# Patient Record
Sex: Male | Born: 1975 | Race: Black or African American | Hispanic: No | Marital: Single | Smoking: Former smoker
Health system: Southern US, Community
[De-identification: ages and names within clinical notes are randomized; demographics above are authoritative.]

## PROBLEM LIST (undated history)

## (undated) DIAGNOSIS — I493 Ventricular premature depolarization: Secondary | ICD-10-CM

## (undated) DIAGNOSIS — J45909 Unspecified asthma, uncomplicated: Secondary | ICD-10-CM

## (undated) DIAGNOSIS — I10 Essential (primary) hypertension: Secondary | ICD-10-CM

## (undated) DIAGNOSIS — I255 Ischemic cardiomyopathy: Secondary | ICD-10-CM

## (undated) DIAGNOSIS — I428 Other cardiomyopathies: Secondary | ICD-10-CM

## (undated) DIAGNOSIS — Z720 Tobacco use: Secondary | ICD-10-CM

## (undated) DIAGNOSIS — E119 Type 2 diabetes mellitus without complications: Secondary | ICD-10-CM

## (undated) DIAGNOSIS — R0600 Dyspnea, unspecified: Secondary | ICD-10-CM

## (undated) DIAGNOSIS — I509 Heart failure, unspecified: Secondary | ICD-10-CM

## (undated) DIAGNOSIS — E785 Hyperlipidemia, unspecified: Secondary | ICD-10-CM

## (undated) DIAGNOSIS — I5022 Chronic systolic (congestive) heart failure: Secondary | ICD-10-CM

## (undated) DIAGNOSIS — N182 Chronic kidney disease, stage 2 (mild): Secondary | ICD-10-CM

## (undated) DIAGNOSIS — M109 Gout, unspecified: Secondary | ICD-10-CM

## (undated) DIAGNOSIS — I4729 Other ventricular tachycardia: Secondary | ICD-10-CM

## (undated) DIAGNOSIS — I2510 Atherosclerotic heart disease of native coronary artery without angina pectoris: Secondary | ICD-10-CM

## (undated) DIAGNOSIS — I454 Nonspecific intraventricular block: Secondary | ICD-10-CM

## (undated) DIAGNOSIS — Z789 Other specified health status: Secondary | ICD-10-CM

## (undated) DIAGNOSIS — I77810 Thoracic aortic ectasia: Secondary | ICD-10-CM

## (undated) HISTORY — DX: Heart failure, unspecified: I50.9

---

## 2006-09-22 ENCOUNTER — Emergency Department: Payer: Self-pay | Admitting: Emergency Medicine

## 2015-03-31 ENCOUNTER — Emergency Department: Payer: Medicaid Other

## 2015-03-31 ENCOUNTER — Other Ambulatory Visit: Payer: Self-pay

## 2015-03-31 ENCOUNTER — Encounter: Payer: Self-pay | Admitting: Emergency Medicine

## 2015-03-31 ENCOUNTER — Inpatient Hospital Stay
Admission: EM | Admit: 2015-03-31 | Discharge: 2015-04-03 | DRG: 292 | Disposition: A | Discharge status: Home / Self Care | Payer: Medicaid Other | Attending: Internal Medicine | Admitting: Internal Medicine

## 2015-03-31 DIAGNOSIS — I426 Alcoholic cardiomyopathy: Secondary | ICD-10-CM | POA: Diagnosis present

## 2015-03-31 DIAGNOSIS — Z79899 Other long term (current) drug therapy: Secondary | ICD-10-CM | POA: Diagnosis not present

## 2015-03-31 DIAGNOSIS — Z7982 Long term (current) use of aspirin: Secondary | ICD-10-CM | POA: Diagnosis not present

## 2015-03-31 DIAGNOSIS — Z9114 Patient's other noncompliance with medication regimen: Secondary | ICD-10-CM

## 2015-03-31 DIAGNOSIS — R778 Other specified abnormalities of plasma proteins: Secondary | ICD-10-CM

## 2015-03-31 DIAGNOSIS — Z6841 Body Mass Index (BMI) 40.0 and over, adult: Secondary | ICD-10-CM | POA: Diagnosis not present

## 2015-03-31 DIAGNOSIS — R079 Chest pain, unspecified: Secondary | ICD-10-CM

## 2015-03-31 DIAGNOSIS — I110 Hypertensive heart disease with heart failure: Principal | ICD-10-CM | POA: Diagnosis present

## 2015-03-31 DIAGNOSIS — R0602 Shortness of breath: Secondary | ICD-10-CM

## 2015-03-31 DIAGNOSIS — F17210 Nicotine dependence, cigarettes, uncomplicated: Secondary | ICD-10-CM | POA: Diagnosis present

## 2015-03-31 DIAGNOSIS — J45909 Unspecified asthma, uncomplicated: Secondary | ICD-10-CM | POA: Diagnosis present

## 2015-03-31 DIAGNOSIS — R7989 Other specified abnormal findings of blood chemistry: Secondary | ICD-10-CM

## 2015-03-31 DIAGNOSIS — F1010 Alcohol abuse, uncomplicated: Secondary | ICD-10-CM | POA: Diagnosis present

## 2015-03-31 DIAGNOSIS — I5021 Acute systolic (congestive) heart failure: Secondary | ICD-10-CM | POA: Diagnosis present

## 2015-03-31 DIAGNOSIS — I248 Other forms of acute ischemic heart disease: Secondary | ICD-10-CM | POA: Diagnosis present

## 2015-03-31 DIAGNOSIS — I509 Heart failure, unspecified: Secondary | ICD-10-CM

## 2015-03-31 HISTORY — DX: Essential (primary) hypertension: I10

## 2015-03-31 LAB — BASIC METABOLIC PANEL
Anion gap: 6 (ref 5–15)
BUN: 19 mg/dL (ref 6–20)
CHLORIDE: 105 mmol/L (ref 101–111)
CO2: 26 mmol/L (ref 22–32)
Calcium: 8.7 mg/dL — ABNORMAL LOW (ref 8.9–10.3)
Creatinine, Ser: 1.23 mg/dL (ref 0.61–1.24)
GFR calc Af Amer: >60 mL/min (ref >60)
GFR calc non Af Amer: >60 mL/min (ref >60)
Glucose, Bld: 96 mg/dL (ref 65–99)
POTASSIUM: 3.7 mmol/L (ref 3.5–5.1)
SODIUM: 137 mmol/L (ref 135–145)

## 2015-03-31 LAB — CBC
HEMATOCRIT: 40.2 % (ref 40.0–52.0)
Hemoglobin: 12.8 g/dL — ABNORMAL LOW (ref 13.0–18.0)
MCH: 26.7 pg (ref 26.0–34.0)
MCHC: 31.9 g/dL — AB (ref 32.0–36.0)
MCV: 83.8 fL (ref 80.0–100.0)
PLATELETS: 237 10*3/uL (ref 150–440)
RBC: 4.8 MIL/uL (ref 4.40–5.90)
RDW: 16.6 % — AB (ref 11.5–14.5)
WBC: 11.7 10*3/uL — AB (ref 3.8–10.6)

## 2015-03-31 LAB — BRAIN NATRIURETIC PEPTIDE: B Natriuretic Peptide: 1340 pg/mL — ABNORMAL HIGH (ref 0.0–100.0)

## 2015-03-31 LAB — FIBRIN DERIVATIVES D-DIMER (ARMC ONLY): Fibrin derivatives D-dimer (ARMC): 475 (ref 0–499)

## 2015-03-31 LAB — TROPONIN I
TROPONIN I: 0.09 ng/mL — AB (ref <0.031)
Troponin I: 0.1 ng/mL — ABNORMAL HIGH (ref <0.031)

## 2015-03-31 MED ORDER — FOLIC ACID 1 MG PO TABS
1.0000 mg | ORAL_TABLET | Freq: Every day | ORAL | Status: DC
  Administered 2015-03-31 – 2015-04-03 (×4): 1 mg via ORAL
  Filled 2015-03-31 (×4): qty 1

## 2015-03-31 MED ORDER — ADULT MULTIVITAMIN W/MINERALS CH
1.0000 | ORAL_TABLET | Freq: Every day | ORAL | Status: DC
  Administered 2015-03-31 – 2015-04-01 (×2): 1 via ORAL
  Filled 2015-03-31 (×3): qty 1

## 2015-03-31 MED ORDER — VITAMIN B-1 100 MG PO TABS
100.0000 mg | ORAL_TABLET | Freq: Every day | ORAL | Status: DC
Start: 2015-03-31 — End: 2015-04-03
  Administered 2015-03-31 – 2015-04-03 (×4): 100 mg via ORAL
  Filled 2015-03-31 (×4): qty 1

## 2015-03-31 MED ORDER — FUROSEMIDE 10 MG/ML IJ SOLN
20.0000 mg | Freq: Two times a day (BID) | INTRAMUSCULAR | Status: DC
  Administered 2015-03-31 – 2015-04-01 (×2): 20 mg via INTRAVENOUS
  Filled 2015-03-31 (×2): qty 2

## 2015-03-31 MED ORDER — ASPIRIN 81 MG PO CHEW
324.0000 mg | CHEWABLE_TABLET | Freq: Once | ORAL | Status: AC
Start: 2015-03-31 — End: 2015-03-31
  Administered 2015-03-31: 324 mg via ORAL

## 2015-03-31 MED ORDER — ENOXAPARIN SODIUM 40 MG/0.4ML ~~LOC~~ SOLN
40.0000 mg | Freq: Two times a day (BID) | SUBCUTANEOUS | Status: DC
  Administered 2015-03-31 – 2015-04-03 (×6): 40 mg via SUBCUTANEOUS
  Filled 2015-03-31 (×6): qty 0.4

## 2015-03-31 MED ORDER — ACETAMINOPHEN 325 MG PO TABS
650.0000 mg | ORAL_TABLET | ORAL | Status: DC | PRN
  Administered 2015-04-01: 650 mg via ORAL
  Filled 2015-03-31: qty 2

## 2015-03-31 MED ORDER — ENOXAPARIN SODIUM 40 MG/0.4ML ~~LOC~~ SOLN: 40.0000 mg | SUBCUTANEOUS | Status: DC

## 2015-03-31 MED ORDER — SIMVASTATIN 20 MG PO TABS
20.0000 mg | ORAL_TABLET | Freq: Every day | ORAL | Status: DC
  Administered 2015-04-01 – 2015-04-02 (×2): 20 mg via ORAL
  Filled 2015-03-31 (×2): qty 1

## 2015-03-31 MED ORDER — ONDANSETRON HCL 4 MG/2ML IJ SOLN: 4.0000 mg | Freq: Four times a day (QID) | INTRAMUSCULAR | Status: DC | PRN

## 2015-03-31 MED ORDER — LORAZEPAM 2 MG/ML IJ SOLN: 1.0000 mg | Freq: Four times a day (QID) | INTRAMUSCULAR | Status: DC | PRN

## 2015-03-31 MED ORDER — METOPROLOL TARTRATE 50 MG PO TABS
50.0000 mg | ORAL_TABLET | Freq: Two times a day (BID) | ORAL | Status: DC
Start: 2015-03-31 — End: 2015-04-01
  Administered 2015-03-31 – 2015-04-01 (×2): 50 mg via ORAL
  Filled 2015-03-31 (×2): qty 1

## 2015-03-31 MED ORDER — IPRATROPIUM-ALBUTEROL 0.5-2.5 (3) MG/3ML IN SOLN
3.0000 mL | Freq: Once | RESPIRATORY_TRACT | Status: AC
  Administered 2015-03-31: 3 mL via RESPIRATORY_TRACT
  Filled 2015-03-31: qty 3

## 2015-03-31 MED ORDER — ASPIRIN 81 MG PO CHEW
324.0000 mg | CHEWABLE_TABLET | Freq: Once | ORAL | Status: AC
  Administered 2015-03-31: 324 mg via ORAL
  Filled 2015-03-31: qty 4

## 2015-03-31 MED ORDER — ASPIRIN 300 MG RE SUPP: 300.0000 mg | RECTAL | Status: AC

## 2015-03-31 MED ORDER — NICOTINE 21 MG/24HR TD PT24
21.0000 mg | MEDICATED_PATCH | Freq: Every day | TRANSDERMAL | Status: DC
  Administered 2015-04-01 – 2015-04-03 (×3): 21 mg via TRANSDERMAL
  Filled 2015-03-31 (×3): qty 1

## 2015-03-31 MED ORDER — LORAZEPAM 1 MG PO TABS
1.0000 mg | ORAL_TABLET | Freq: Four times a day (QID) | ORAL | Status: DC | PRN
  Administered 2015-04-01: 1 mg via ORAL
  Filled 2015-03-31: qty 1

## 2015-03-31 MED ORDER — ADULT MULTIVITAMIN W/MINERALS CH
1.0000 | ORAL_TABLET | Freq: Every day | ORAL | Status: DC
Start: 2015-03-31 — End: 2015-04-03
  Administered 2015-04-02 – 2015-04-03 (×2): 1 via ORAL
  Filled 2015-03-31: qty 1

## 2015-03-31 MED ORDER — ASPIRIN EC 81 MG PO TBEC
81.0000 mg | DELAYED_RELEASE_TABLET | Freq: Every day | ORAL | Status: DC
  Administered 2015-04-01 – 2015-04-03 (×3): 81 mg via ORAL
  Filled 2015-03-31 (×3): qty 1

## 2015-03-31 MED ORDER — GUAIFENESIN ER 600 MG PO TB12
600.0000 mg | ORAL_TABLET | Freq: Two times a day (BID) | ORAL | Status: DC | PRN
  Administered 2015-04-01: 600 mg via ORAL
  Filled 2015-03-31: qty 1

## 2015-03-31 MED ORDER — THIAMINE HCL 100 MG/ML IJ SOLN: 100.0000 mg | Freq: Every day | INTRAMUSCULAR | Status: DC

## 2015-03-31 MED ORDER — NITROGLYCERIN 0.4 MG SL SUBL: 0.4000 mg | SUBLINGUAL_TABLET | SUBLINGUAL | Status: DC | PRN

## 2015-03-31 MED ORDER — METOPROLOL TARTRATE 1 MG/ML IV SOLN
5.0000 mg | Freq: Once | INTRAVENOUS | Status: AC
  Administered 2015-03-31: 5 mg via INTRAVENOUS
  Filled 2015-03-31: qty 5

## 2015-03-31 MED ORDER — ASPIRIN 81 MG PO CHEW
324.0000 mg | CHEWABLE_TABLET | ORAL | Status: AC
  Administered 2015-03-31: 324 mg via ORAL
  Filled 2015-03-31: qty 4

## 2015-03-31 NOTE — ED Notes (Signed)
Dr Derrill Kay and charge rn notified troponin 0.09

## 2015-03-31 NOTE — ED Notes (Addendum)
Difficulty breathing x 3 weeks.  Also productive cough.  C/o chest pain with coughing and deep breathing.   Has history of asthma as a child.

## 2015-03-31 NOTE — ED Notes (Signed)
Pt states he came in today bc he has been feeling sob for 2 wks.  Also reports cough with clear mucus.  States he has pain in center of chest when he coughs.  Pt sitting up on stretcher watching tv at this time.  Skin w/d, heart sounds normal, lungs clear.

## 2015-03-31 NOTE — H&P (Signed)
[facility] - [place] at [facility]   PATIENT NAME: Philip Monroe    MR#:  [id number]  DATE OF BIRTH:  Mar 12, 1976  DATE OF ADMISSION:  03/31/2015  PRIMARY CARE PHYSICIAN: [facility]   REQUESTING/REFERRING PHYSICIAN: Dr. Carollee Massed  CHIEF COMPLAINT:  Chest pain, shortness of breath and lower extent is swelling  HISTORY OF PRESENT ILLNESS:  Philip Monroe  is a 40 y.o. male with a known history of essential hypertension not on any home medications, morbid obesity, drinks beer heavily on daily basis admits 12 beers per day and continues to smoke is presenting to the ED with a chief complaint of one-week history of midsternal chest pain associated with shortness of breath and lower extent is swelling for the past to 3 days. Patient couldn't catch his breath and came into the ED. Chest x-ray with the vascular congestion and her initial troponin is abnormal. Patient's mom is at bedside  PAST MEDICAL HISTORY:   Past Medical History  Diagnosis Date  . Hypertension     PAST SURGICAL HISTOIRY:  History reviewed. No pertinent past surgical history.  SOCIAL HISTORY:   Social History  Substance Use Topics  . Smoking status: Current Every Day Smoker -- 1.00 packs/day    Types: Cigarettes  . Smokeless tobacco: Never Used  . Alcohol Use: Yes     Comment: 3 - 32 oz beers per day    FAMILY HISTORY:  No family history on file.  DRUG ALLERGIES:   Allergies  Allergen Reactions  . Lactose Intolerance (Gi) Diarrhea    REVIEW OF SYSTEMS:  CONSTITUTIONAL: No fever, fatigue or weakness.  EYES: No blurred or double vision.  EARS, NOSE, AND THROAT: No tinnitus or ear pain.  RESPIRATORY: Reports cough, shortness of breath, denies wheezing or hemoptysis.  CARDIOVASCULAR: 1 week history of midsternal chest pain, orthopnea, lower extremity edema.  GASTROINTESTINAL: No nausea, vomiting, diarrhea or abdominal pain.  GENITOURINARY: No dysuria, hematuria.   ENDOCRINE: No polyuria, nocturia,  HEMATOLOGY: No anemia, easy bruising or bleeding SKIN: No rash or lesion. MUSCULOSKELETAL: No joint pain or arthritis.   NEUROLOGIC: No tingling, numbness, weakness.  PSYCHIATRY: No anxiety or depression.   MEDICATIONS AT HOME:   Prior to Admission medications   Medication Sig Start Date End Date Taking? Authorizing Provider  guaiFENesin (MUCINEX) 600 MG 12 hr tablet Take 600 mg by mouth 2 (two) times daily as needed for cough or to loosen phlegm.   Yes Historical Provider, MD  guaiFENesin (ROBITUSSIN) 100 MG/5ML SOLN Take 5 mLs by mouth every 4 (four) hours as needed for cough.   Yes Historical Provider, MD  Multiple Vitamin (MULTIVITAMIN WITH MINERALS) TABS tablet Take 1 tablet by mouth daily.   Yes Historical Provider, MD      VITAL SIGNS:  Blood pressure 159/98, pulse 81, temperature 98.2 F (36.8 C), temperature source Oral, resp. rate 30, height  (1.905 m), weight 158.759 kg (350 lb), SpO2 95 %.  PHYSICAL EXAMINATION:  GENERAL:  40 y.o.-year-old patient lying in the bed with no acute distress.  EYES: Pupils equal, round, reactive to light and accommodation. No scleral icterus. Extraocular muscles intact.  HEENT: Head atraumatic, normocephalic. Oropharynx and nasopharynx clear.  NECK:  Supple, no jugular venous distention. No thyroid enlargement, no tenderness.  LUNGS: Diminished breath sounds bilaterally at lower lung fields, no wheezing, positive rales,rhonchi . No use of accessory muscles of respiration.  CARDIOVASCULAR: S1, S2 normal. No murmurs, rubs, or gallops. Nonreproducible midsternal chest pain  ABDOMEN: Soft, nontender, nondistended. Bowel sounds present. No organomegaly or mass.  EXTREMITIES: 1+ pedal edema, no cyanosis, or clubbing.  NEUROLOGIC: Cranial nerves II through XII are intact. Muscle strength 5/5 in all extremities. Sensation intact. Gait not checked.  PSYCHIATRIC: The patient is alert and oriented x 3.  SKIN: No  obvious rash, lesion, or ulcer.   LABORATORY PANEL:   CBC  Recent Labs Lab 03/31/15 1626  WBC 11.7*  HGB 12.8*  HCT 40.2  PLT 237   ------------------------------------------------------------------------------------------------------------------  Chemistries   Recent Labs Lab 03/31/15 1626  NA 137  K 3.7  CL 105  CO2 26  GLUCOSE 96  BUN 19  CREATININE 1.23  CALCIUM 8.7*   ------------------------------------------------------------------------------------------------------------------  Cardiac Enzymes  Recent Labs Lab 03/31/15 1626  TROPONINI 0.09*   ------------------------------------------------------------------------------------------------------------------  RADIOLOGY:  Dg Chest 2 View  03/31/2015  CLINICAL DATA:  Cough.  Short of breath.  History of hypertension. EXAM: CHEST  2 VIEW COMPARISON:  None. FINDINGS: Cardiac silhouette is mildly enlarged. No mediastinal or hilar masses or convincing adenopathy. There is bilateral interstitial thickening. There is no focal lung consolidation to suggest pneumonia. No pleural effusion or pneumothorax. Bony thorax is unremarkable. IMPRESSION: 1. Cardiomegaly with interstitial thickening consistent with mild congestive heart failure. Electronically Signed   By: Amie Portland M.D.   On: 03/31/2015 18:03    EKG:   Orders placed or performed during the hospital encounter of 03/31/15  . ED EKG within 10 minutes  . ED EKG within 10 minutes  . EKG 12-lead    IMPRESSION AND PLAN:   1. Chest pain with abnormal troponin could be from  uncontrolled hypertension/new onset congestive heart failure Admit patient to telemetry Cycle cardiac biomarkers; initial troponin at 0.09 Will get echocardiogram Nitroglycerin sublingually as needed basis Consult cardiology Tight control of the blood pressure is needed  2. 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 Alcohol abuse ciwa protocol Patient will be benefited with outpatient alcohol rehabilitation  5. Tobacco abuse disorder Counseled patient to quit smoking for 3-5 minutes. Patient is agreeable. We will start him on nicotine patch after ruling out  acute coronary syndrome    All the records are reviewed and case discussed with ED provider. Management plans discussed with the patient, family and they are in agreement.  CODE STATUS: fc, mom is the health healthcare power of attorney  TOTAL TIME TAKING CARE OF THIS PATIENT: 45 minutes.    Ramonita Lab M.D on 03/31/2015 at 9:16 PM  Between 7am to 6pm - Pager - [phone]  After 6pm go to [email or web address] - password [email or web address] [facility]  [facility]  Office  [phone]  CC: Primary care physician; [facility]

## 2015-03-31 NOTE — ED Provider Notes (Signed)
[facility] Emergency Department Provider Note  ____________________________________________  Time seen: 1530  I have reviewed the triage vital signs and the nursing notes.  History by:  Patient  HISTORY  Chief Complaint Cough     HPI Philip Monroe is a 40 y.o. male with shortness of breath and chest discomfort over the past 2-3 weeks.  The patient has been using Mucinex and Robitussin for cough and for the congestion. He reports he has tightness in his chest and some heaviness. He reports he had asthma when he was a kid. He has a history of hypertension, but does not take any medications currently for it. He does smoke cigarettes.  He denies any history of blood clots. He denies any peripheral edema. He denies any fever.    Past Medical History  Diagnosis Date  . Hypertension     There are no active problems to display for this patient.   History reviewed. No pertinent past surgical history.  Current Outpatient Rx  Name  Route  Sig  Dispense  Refill  . guaiFENesin (MUCINEX) 600 MG 12 hr tablet   Oral   Take 600 mg by mouth 2 (two) times daily as needed for cough or to loosen phlegm.         Marland Kitchen guaiFENesin (ROBITUSSIN) 100 MG/5ML SOLN   Oral   Take 5 mLs by mouth every 4 (four) hours as needed for cough.         . Multiple Vitamin (MULTIVITAMIN WITH MINERALS) TABS tablet   Oral   Take 1 tablet by mouth daily.           Allergies Lactose intolerance (gi)  No family history on file.  Social History Social History  Substance Use Topics  . Smoking status: Current Every Day Smoker -- 1.00 packs/day    Types: Cigarettes  . Smokeless tobacco: Never Used  . Alcohol Use: Yes     Comment: 3 - 32 oz beers per day    Review of Systems  Constitutional: Negative for fever/chills. ENT: Negative for congestion. Cardiovascular: Positive for chest pressure.Marland Kitchen Respiratory: Positive for cough and shortness of breath. Gastrointestinal:  Negative for abdominal pain, vomiting and diarrhea. Genitourinary: Negative for dysuria. Musculoskeletal: No back pain. Skin: Negative for rash. Neurological: Negative for headache or focal weakness   10-point ROS otherwise negative.  ____________________________________________   PHYSICAL EXAM:  VITAL SIGNS: ED Triage Vitals  Enc Vitals Group     BP 03/31/15 1619 204/151 mmHg     Pulse Rate 03/31/15 1619 103     Resp 03/31/15 1619 20     Temp 03/31/15 1619 98.2 F (36.8 C)     Temp Source 03/31/15 1619 Oral     SpO2 03/31/15 1619 97 %     Weight 03/31/15 1619 350 lb (158.759 kg)     Height 03/31/15 1619  (1.905 m)     Head Cir --      Peak Flow --      Pain Score 03/31/15 1620 0     Pain Loc --      Pain Edu? --      Excl. in GC? --     Constitutional: Alert and oriented. Has 5-6 word dyspnea and looks a little bit uncomfortable, but no acute distress. ENT   Head: Normocephalic and atraumatic.   Nose: No congestion/rhinnorhea.       Mouth: No erythema, no swelling   Cardiovascular: Normal rate, regular rhythm, no murmur noted  Respiratory:  Mild increase work of breathing. Mild coarse breath sounds bilaterally.  Gastrointestinal: Soft, no distention. Nontender Back: No muscle spasm, no tenderness, no CVA tenderness. Musculoskeletal: No deformity noted. Nontender with normal range of motion in all extremities.  No noted edema. Neurologic:  Communicative. Normal appearing spontaneous movement in all 4 extremities. No gross focal neurologic deficits are appreciated.  Skin:  Skin is warm, dry. No rash noted. Psychiatric: Mood and affect are normal. Speech and behavior are normal.  ____________________________________________    LABS (pertinent positives/negatives)  Labs Reviewed  BASIC METABOLIC PANEL - Abnormal; Notable for the following:    Calcium 8.7 (*)    All other components within normal limits  CBC - Abnormal; Notable for the following:    WBC  11.7 (*)    Hemoglobin 12.8 (*)    MCHC 31.9 (*)    RDW 16.6 (*)    All other components within normal limits  TROPONIN I - Abnormal; Notable for the following:    Troponin I 0.09 (*)    All other components within normal limits  FIBRIN DERIVATIVES D-DIMER ([facility] ONLY)     ____________________________________________   EKG  ED ECG REPORT I, Minh Jasper W, the attending physician, personally viewed and interpreted this ECG.   Date: 03/31/2015  EKG Time: 1630  Rate: 101  Rhythm: Sinus tachycardia  Axis: Normal  Intervals: Normal  ST&T Change: No ischemic changes noted.   ____________________________________________    RADIOLOGY  Chest x-ray:  IMPRESSION: 1. Cardiomegaly with interstitial thickening consistent with mild congestive heart failure. ____________________________________________    PROCEDURES  ____________________________________________   INITIAL IMPRESSION / ASSESSMENT AND PLAN / ED COURSE  Pertinent labs & imaging results that were available during my care of the patient were reviewed by me and considered in my medical decision making (see chart for details).  40 year old male with shortness of breath, some chest tightness, signs and symptoms of a respirator tract infection, but with a troponin of 0.09. His EKG does not show any ischemic changes. We'll treat him with aspirin currently. It is unclear if this troponin level of 0.09 is baseline for the patient or a and acute or subacute change.   ----------------------------------------- 7:36 PM on 03/31/2015 -----------------------------------------  D-dimer was negative. Patient is notably hypertensive at 186/125. We will give him a dose of metoprolol. With the borderline troponin level and some vascular congestion noted on chest x-ray, we will admit him to the hospital for serial enzymes and possible echocardiogram.  ____________________________________________   FINAL CLINICAL IMPRESSION(S)  / ED DIAGNOSES  Final diagnoses:  Chest pain, unspecified chest pain type  Shortness of breath  Troponin level elevated      Darien Ramus, MD 03/31/15 [phone]

## 2015-03-31 NOTE — Progress Notes (Signed)
Anticoagulation monitoring(Lovenox):   40 yo  ordered Lovenox 40 mg Q24h  Filed Weights   03/31/15 1619  Weight: 350 lb (158.759 kg)   BMI 43.8   Lab Results  Component Value Date   CREATININE 1.23 03/31/2015   Estimated Creatinine Clearance: 131.5 mL/min (by C-G formula based on Cr of 1.23). Hemoglobin & Hematocrit     Component Value Date/Time   HGB 12.8* 03/31/2015 1626   HCT 40.2 03/31/2015 1626     Per Protocol for Patient with estCrcl< 30 ml/min and BMI < 40, will transition to Lovenox 40  mg Q24hQ12 h.

## 2015-04-01 ENCOUNTER — Inpatient Hospital Stay (HOSPITAL_COMMUNITY)
Admit: 2015-04-01 | Discharge: 2015-04-01 | Disposition: A | Discharge status: Home / Self Care | Payer: Medicaid Other | Attending: Internal Medicine | Admitting: Internal Medicine

## 2015-04-01 DIAGNOSIS — R0600 Dyspnea, unspecified: Secondary | ICD-10-CM

## 2015-04-01 LAB — CBC
HEMATOCRIT: 38.5 % — AB (ref 40.0–52.0)
Hemoglobin: 12.2 g/dL — ABNORMAL LOW (ref 13.0–18.0)
MCH: 26.8 pg (ref 26.0–34.0)
MCHC: 31.7 g/dL — AB (ref 32.0–36.0)
MCV: 84.5 fL (ref 80.0–100.0)
Platelets: 210 10*3/uL (ref 150–440)
RBC: 4.55 MIL/uL (ref 4.40–5.90)
RDW: 16.6 % — AB (ref 11.5–14.5)
WBC: 10.2 10*3/uL (ref 3.8–10.6)

## 2015-04-01 LAB — BASIC METABOLIC PANEL
ANION GAP: 4 — AB (ref 5–15)
BUN: 22 mg/dL — ABNORMAL HIGH (ref 6–20)
CALCIUM: 8.9 mg/dL (ref 8.9–10.3)
CO2: 31 mmol/L (ref 22–32)
Chloride: 105 mmol/L (ref 101–111)
Creatinine, Ser: 1.39 mg/dL — ABNORMAL HIGH (ref 0.61–1.24)
Glucose, Bld: 117 mg/dL — ABNORMAL HIGH (ref 65–99)
Potassium: 4.1 mmol/L (ref 3.5–5.1)
Sodium: 140 mmol/L (ref 135–145)

## 2015-04-01 LAB — TROPONIN I
Troponin I: 0.08 ng/mL — ABNORMAL HIGH (ref <0.031)
Troponin I: 0.08 ng/mL — ABNORMAL HIGH (ref <0.031)

## 2015-04-01 LAB — LIPID PANEL
CHOLESTEROL: 138 mg/dL (ref 0–200)
HDL: 28 mg/dL — AB (ref >40)
LDL Cholesterol: 87 mg/dL (ref 0–99)
TRIGLYCERIDES: 114 mg/dL (ref <150)
Total CHOL/HDL Ratio: 4.9 RATIO
VLDL: 23 mg/dL (ref 0–40)

## 2015-04-01 LAB — TSH: TSH: 3.25 u[IU]/mL (ref 0.350–4.500)

## 2015-04-01 MED ORDER — LISINOPRIL 20 MG PO TABS
20.0000 mg | ORAL_TABLET | Freq: Every day | ORAL | Status: DC
  Administered 2015-04-01: 20 mg via ORAL
  Filled 2015-04-01 (×2): qty 1

## 2015-04-01 MED ORDER — METOPROLOL TARTRATE 100 MG PO TABS
100.0000 mg | ORAL_TABLET | Freq: Two times a day (BID) | ORAL | Status: DC
  Administered 2015-04-01 – 2015-04-03 (×4): 100 mg via ORAL
  Filled 2015-04-01 (×4): qty 1

## 2015-04-01 MED ORDER — LABETALOL HCL 5 MG/ML IV SOLN
10.0000 mg | INTRAVENOUS | Status: DC | PRN
  Administered 2015-04-01: 10 mg via INTRAVENOUS
  Filled 2015-04-01: qty 4

## 2015-04-01 MED ORDER — NITROGLYCERIN 2 % TD OINT
1.0000 [in_us] | TOPICAL_OINTMENT | Freq: Once | TRANSDERMAL | Status: AC
  Administered 2015-04-01: 1 [in_us] via TOPICAL
  Filled 2015-04-01: qty 1

## 2015-04-01 MED ORDER — LABETALOL HCL 5 MG/ML IV SOLN
10.0000 mg | INTRAVENOUS | Status: DC | PRN
  Administered 2015-04-02: 10 mg via INTRAVENOUS
  Filled 2015-04-01: qty 4

## 2015-04-01 MED ORDER — HYDROCHLOROTHIAZIDE 25 MG PO TABS
25.0000 mg | ORAL_TABLET | Freq: Every day | ORAL | Status: DC
  Administered 2015-04-01: 25 mg via ORAL
  Filled 2015-04-01 (×2): qty 1

## 2015-04-01 MED ORDER — HYDRALAZINE HCL 20 MG/ML IJ SOLN
INTRAMUSCULAR | Status: AC
  Filled 2015-04-01: qty 1

## 2015-04-01 MED ORDER — FUROSEMIDE 20 MG PO TABS
20.0000 mg | ORAL_TABLET | Freq: Two times a day (BID) | ORAL | Status: DC
  Administered 2015-04-01 – 2015-04-03 (×4): 20 mg via ORAL
  Filled 2015-04-01 (×4): qty 1

## 2015-04-01 MED ORDER — NITROGLYCERIN 2 % TD OINT
2.0000 [in_us] | TOPICAL_OINTMENT | Freq: Once | TRANSDERMAL | Status: AC
  Administered 2015-04-01: 2 [in_us] via TOPICAL
  Filled 2015-04-01: qty 2

## 2015-04-01 MED ORDER — HYDRALAZINE HCL 20 MG/ML IJ SOLN
10.0000 mg | Freq: Once | INTRAMUSCULAR | Status: AC
  Administered 2015-04-01: 10 mg via INTRAVENOUS

## 2015-04-01 MED ORDER — IPRATROPIUM-ALBUTEROL 0.5-2.5 (3) MG/3ML IN SOLN
3.0000 mL | Freq: Four times a day (QID) | RESPIRATORY_TRACT | Status: DC
  Administered 2015-04-01 – 2015-04-02 (×7): 3 mL via RESPIRATORY_TRACT
  Filled 2015-04-01 (×8): qty 3

## 2015-04-01 MED ORDER — LABETALOL HCL 5 MG/ML IV SOLN: 10.0000 mg | INTRAVENOUS | Status: DC | PRN

## 2015-04-01 MED ORDER — HYDROCHLOROTHIAZIDE 25 MG PO TABS: 25.0000 mg | ORAL_TABLET | Freq: Every day | ORAL | Status: DC

## 2015-04-01 NOTE — Progress Notes (Signed)
MD notified patient's second troponin results 0.10, as requested by previous ordering physician. No new orders obtained.

## 2015-04-01 NOTE — Progress Notes (Signed)
*  PRELIMINARY RESULTS* Echocardiogram 2D Echocardiogram has been performed.  Philip Monroe Hege 04/01/2015, 10:02 AM

## 2015-04-01 NOTE — H&P (Signed)
[facility]  CARDIOLOGY CONSULT NOTE  Patient ID: Philip Monroe MRN: [id number] DOB/AGE: Nov 10, 1975 40 y.o.  Admit date: 03/31/2015 Referring Physician Allena Katz Primary Physician [facility] Primary Cardiologist Reason for Consultation chest pain  HPI: 40 year old gentleman referred for evaluation of chest pain. The patient has a history of essential hypertension currently not taking any medications. He admits to drinking 12 beers a day and a pack of cigarettes. He presents to [facility] emergency room with a one-week history of intermittent chest pain. He describes substernal chest discomfort, notable when he lays back in bed, or coughs, lead by sitting up. Patient has also experienced shortness of breath and lower extremity edema for the past 3 days. He presents to [facility] emergency room with chief complaint of shortness of breath, chest x-ray revealed mild vascular congestion consistent with diastolic congestive heart failure. Admission labs were notable for borderline elevated troponin of 0.09 without peak or trough. Patient has been treated with intravenous furosemide with diuresis and overall clinical improvement.  Review of systems complete and found to be negative unless listed above     Past Medical History  Diagnosis Date  . Hypertension     History reviewed. No pertinent past surgical history.  Prescriptions prior to admission  Medication Sig Dispense Refill Last Dose  . guaiFENesin (MUCINEX) 600 MG 12 hr tablet Take 600 mg by mouth 2 (two) times daily as needed for cough or to loosen phlegm.   Past Week at Unknown time  . guaiFENesin (ROBITUSSIN) 100 MG/5ML SOLN Take 5 mLs by mouth every 4 (four) hours as needed for cough.   03/30/2015 at Unsure   . Multiple Vitamin (MULTIVITAMIN WITH MINERALS) TABS tablet Take 1 tablet by mouth daily.   03/31/2015 at Unknown time   Social History   Social History  . Marital Status: Single    Spouse Name: N/A  . Number of Children: N/A  .  Years of Education: N/A   Occupational History  . Not on file.   Social History Main Topics  . Smoking status: Current Every Day Smoker -- 1.00 packs/day    Types: Cigarettes  . Smokeless tobacco: Never Used  . Alcohol Use: Yes     Comment: 3 - 32 oz beers per day  . Drug Use: No  . Sexual Activity: Yes    Birth Control/ Protection: Coitus interruptus, Condom   Other Topics Concern  . Not on file   Social History Narrative    History reviewed. No pertinent family history.    Review of systems complete and found to be negative unless listed above      PHYSICAL EXAM  General: Well developed, well nourished, in no acute distress HEENT:  Normocephalic and atramatic Neck:  No JVD.  Lungs: Clear bilaterally to auscultation and percussion. Heart: HRRR . Normal S1 and S2 without gallops or murmurs.  Abdomen: Bowel sounds are positive, abdomen soft and non-tender  Msk:  Back normal, normal gait. Normal strength and tone for age. Extremities: No clubbing, cyanosis or edema.   Neuro: Alert and oriented X 3. Psych:  Good affect, responds appropriately  Labs:   Lab Results  Component Value Date   WBC 10.2 04/01/2015   HGB 12.2* 04/01/2015   HCT 38.5* 04/01/2015   MCV 84.5 04/01/2015   PLT 210 04/01/2015    Recent Labs Lab 04/01/15 0519  NA 140  K 4.1  CL 105  CO2 31  BUN 22*  CREATININE 1.39*  CALCIUM 8.9  GLUCOSE  117*   Lab Results  Component Value Date   TROPONINI 0.08* 04/01/2015    Lab Results  Component Value Date   CHOL 138 04/01/2015   Lab Results  Component Value Date   HDL 28* 04/01/2015   Lab Results  Component Value Date   LDLCALC 87 04/01/2015   Lab Results  Component Value Date   TRIG 114 04/01/2015   Lab Results  Component Value Date   CHOLHDL 4.9 04/01/2015   No results found for: LDLDIRECT    Radiology: Dg Chest 2 View  03/31/2015  CLINICAL DATA:  Cough.  Short of breath.  History of hypertension. EXAM: CHEST  2 VIEW  COMPARISON:  None. FINDINGS: Cardiac silhouette is mildly enlarged. No mediastinal or hilar masses or convincing adenopathy. There is bilateral interstitial thickening. There is no focal lung consolidation to suggest pneumonia. No pleural effusion or pneumothorax. Bony thorax is unremarkable. IMPRESSION: 1. Cardiomegaly with interstitial thickening consistent with mild congestive heart failure. Electronically Signed   By: Amie Portland M.D.   On: 03/31/2015 18:03    EKG: Sinus rhythm  ASSESSMENT AND PLAN:   1. Chest pain, with atypical features, nondiagnostic ECG. Troponin is borderline elevated, likely due to demand supply ischemia, in the setting of uncontrolled hypertension and probable diastolic congestive heart failure. 2. Essential hypertension 3. Acute diastolic congestive heart failure 4. Alcohol abuse 5. Tobacco abuse  Recommendations  1. Agree with overall current therapy 2. For full dose anticoagulation at this time 3. Review 2-D echocardiogram 4. Lexiscan sestamibi study in the a.m.    SignedMarcina Millard MD,PhD, [facility] 04/01/2015, 1:31 PM

## 2015-04-01 NOTE — Progress Notes (Signed)
MD notified patient remains hypertensive 169/112 after receiving previous order for IV Labetalol and PO Ativan. Orders obtained for ntg paste 1inch to chest and  IV hydralazine. Will administer medications and reassess.

## 2015-04-01 NOTE — Progress Notes (Signed)
MD notified of patient's continued HTN, despite IV lasix and PO metoprolol. New orders received for PO lisinopril and HCTZ both PO. Also, patient c/o SOB and chest tightness with inhalation, MD informed- new orders for SVN's. Will continue to monitor

## 2015-04-01 NOTE — Plan of Care (Signed)
Problem: Tissue Perfusion: Goal: Risk factors for ineffective tissue perfusion will decrease Outcome: Progressing SQ lovenox  Problem: Activity: Goal: Risk for activity intolerance will decrease Outcome: Progressing Patient is ambulatory, independent in the room.  Problem: Fluid Volume: Goal: Ability to maintain a balanced intake and output will improve Outcome: Progressing Patient diuresing well with IV lasix.

## 2015-04-01 NOTE — Progress Notes (Signed)
Dr. Allena Katz paged, pt DBP remains borderline high, orders received for nitro paste.

## 2015-04-01 NOTE — Progress Notes (Signed)
Stress test consent signed and placed in chart.

## 2015-04-01 NOTE — Plan of Care (Signed)
Problem: Education: Goal: Ability to demonstrate managment of disease process will improve Outcome: Progressing Patient and mother watched both CHF videos, no questions at this time. CHF booklet is unavailable at this time, however, education from [facility] has been printed and provided to patient.

## 2015-04-01 NOTE — Progress Notes (Signed)
[facility] Physicians - [place] at [facility]                                                                                                                                                                                            Patient Demographics   Philip Monroe, is a 40 y.o. male, DOB - [birth year], ZOX:[id number]  Admit date - 03/31/2015   Admitting Physician Ramonita Lab, MD  Outpatient Primary MD for the patient is [facility]   LOS - 1  Subjective: Patient reports that his shortness of breath and chest pain is improved. Swelling in the lower extremities improved     Review of Systems:   CONSTITUTIONAL: No documented fever. No fatigue, weakness. No weight gain, no weight loss.  EYES: No blurry or double vision.  ENT: No tinnitus. No postnasal drip. No redness of the oropharynx.  RESPIRATORY: No cough, no wheeze, no hemoptysis. Positive dyspnea.  CARDIOVASCULAR: No chest pain. No orthopnea. No palpitations. No syncope.  GASTROINTESTINAL: No nausea, no vomiting or diarrhea. No abdominal pain. No melena or hematochezia.  GENITOURINARY: No dysuria or hematuria.  ENDOCRINE: No polyuria or nocturia. No heat or cold intolerance.  HEMATOLOGY: No anemia. No bruising. No bleeding.  INTEGUMENTARY: No rashes. No lesions.  MUSCULOSKELETAL: No arthritis. No swelling. No gout.  NEUROLOGIC: No numbness, tingling, or ataxia. No seizure-type activity.  PSYCHIATRIC: No anxiety. No insomnia. No ADD.    Vitals:   Filed Vitals:   04/01/15 8119 04/01/15 0655 04/01/15 1117 04/01/15 1118  BP: 165/125 147/93 144/111 145/95  Pulse:   75 73  Temp:   98 F (36.7 C)   TempSrc:   Oral   Resp:   18   Height:      Weight:      SpO2:   95%     Wt Readings from Last 3 Encounters:  04/01/15 153.044 kg (337 lb 6.4 oz)     Intake/Output Summary (Last 24 hours) at 04/01/15 1438 Last data filed at 04/01/15 1150  Gross per 24 hour  Intake      0 ml  Output   3450 ml   Net  -3450 ml    Physical Exam:   GENERAL: Pleasant-appearing in no apparent distress.  HEAD, EYES, EARS, NOSE AND THROAT: Atraumatic, normocephalic. Extraocular muscles are intact. Pupils equal and reactive to light. Sclerae anicteric. No conjunctival injection. No oro-pharyngeal erythema.  NECK: Supple. There is no jugular venous distention. No bruits, no lymphadenopathy, no thyromegaly.  HEART: Regular rate and rhythm,. No murmurs, no rubs, no clicks.  LUNGS: Clear to auscultation bilaterally. No rales or rhonchi.  No wheezes.  ABDOMEN: Soft, flat, nontender, nondistended. Has good bowel sounds. No hepatosplenomegaly appreciated.  EXTREMITIES: No evidence of any cyanosis, clubbing, or peripheral edema.  +2 pedal and radial pulses bilaterally.  NEUROLOGIC: The patient is alert, awake, and oriented x3 with no focal motor or sensory deficits appreciated bilaterally.  SKIN: Moist and warm with no rashes appreciated.  Psych: Not anxious, depressed LN: No inguinal LN enlargement    Antibiotics   Anti-infectives    None      Medications   Scheduled Meds: . aspirin EC  81 mg Oral Daily  . enoxaparin (LOVENOX) injection  40 mg Subcutaneous Q12H  . folic acid  1 mg Oral Daily  . furosemide  20 mg Oral BID  . hydrALAZINE      . hydrochlorothiazide  25 mg Oral Daily  . ipratropium-albuterol  3 mL Nebulization Q6H  . lisinopril  20 mg Oral Daily  . metoprolol tartrate  50 mg Oral BID  . multivitamin with minerals  1 tablet Oral Daily  . multivitamin with minerals  1 tablet Oral Daily  . nicotine  21 mg Transdermal Daily  . simvastatin  20 mg Oral q1800  . thiamine  100 mg Oral Daily   Or  . thiamine  100 mg Intravenous Daily   Continuous Infusions:  PRN Meds:.acetaminophen, guaiFENesin, labetalol, LORazepam **OR** LORazepam, nitroGLYCERIN, ondansetron (ZOFRAN) IV   Data Review:   Micro Results No results found for this or any previous visit (from the past 240  hour(s)).  Radiology Reports Dg Chest 2 View  03/31/2015  CLINICAL DATA:  Cough.  Short of breath.  History of hypertension. EXAM: CHEST  2 VIEW COMPARISON:  None. FINDINGS: Cardiac silhouette is mildly enlarged. No mediastinal or hilar masses or convincing adenopathy. There is bilateral interstitial thickening. There is no focal lung consolidation to suggest pneumonia. No pleural effusion or pneumothorax. Bony thorax is unremarkable. IMPRESSION: 1. Cardiomegaly with interstitial thickening consistent with mild congestive heart failure. Electronically Signed   By: Amie Portland M.D.   On: 03/31/2015 18:03     CBC  Recent Labs Lab 03/31/15 1626 04/01/15 0519  WBC 11.7* 10.2  HGB 12.8* 12.2*  HCT 40.2 38.5*  PLT 237 210  MCV 83.8 84.5  MCH 26.7 26.8  MCHC 31.9* 31.7*  RDW 16.6* 16.6*    Chemistries   Recent Labs Lab 03/31/15 1626 04/01/15 0519  NA 137 140  K 3.7 4.1  CL 105 105  CO2 26 31  GLUCOSE 96 117*  BUN 19 22*  CREATININE 1.23 1.39*  CALCIUM 8.7* 8.9   ------------------------------------------------------------------------------------------------------------------ estimated creatinine clearance is 114 mL/min (by C-G formula based on Cr of 1.39). ------------------------------------------------------------------------------------------------------------------ No results for input(s): HGBA1C in the last 72 hours. ------------------------------------------------------------------------------------------------------------------  Recent Labs  04/01/15 0519  CHOL 138  HDL 28*  LDLCALC 87  TRIG 811  CHOLHDL 4.9   ------------------------------------------------------------------------------------------------------------------  Recent Labs  04/01/15 0519  TSH 3.250   ------------------------------------------------------------------------------------------------------------------ No results for input(s): VITAMINB12, FOLATE, FERRITIN, TIBC, IRON, RETICCTPCT  in the last 72 hours.  Coagulation profile No results for input(s): INR, PROTIME in the last 168 hours.  No results for input(s): DDIMER in the last 72 hours.  Cardiac Enzymes  Recent Labs Lab 03/31/15 2202 04/01/15 0519 04/01/15 1016  TROPONINI 0.10* 0.08* 0.08*   ------------------------------------------------------------------------------------------------------------------ Invalid input(s): POCBNP    Assessment & Plan   1. Chest pain with abnormal troponin  Seen by cardiology plan for stress may be in the morning  2. 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 Alcohol abuse ciwa protocol Patient will be benefited with outpatient alcohol rehabilitation  5. Tobacco abuse disorder Counseled by admiting md      Code Status Orders        Start     Ordered   03/31/15 2048  Full code   Continuous     03/31/15 2047    Code Status History    Date Active Date Inactive Code Status Order ID Comments User Context   This patient has a current code status but no historical code status.           Consults   DVT Prophylaxis  Lovenox    Lab Results  Component Value Date   PLT 210 04/01/2015     Time Spent in minutes   Auburn Bilberry M.D on 04/01/2015 at 2:38 PM  Between 7am to 6pm - Pager - [phone]  After 6pm go to [email or web address] - password [email or web address] [facility]  [facility] [place] [facility]   Office  [phone]

## 2015-04-01 NOTE — Plan of Care (Signed)
Problem: Health Behavior/Discharge Planning: Goal: Ability to manage health-related needs will improve Outcome: Progressing Patient new CHF, education has begun. Patient appears receptive.

## 2015-04-02 ENCOUNTER — Inpatient Hospital Stay: Payer: Medicaid Other

## 2015-04-02 LAB — BASIC METABOLIC PANEL
Anion gap: 8 (ref 5–15)
BUN: 22 mg/dL — ABNORMAL HIGH (ref 6–20)
CHLORIDE: 104 mmol/L (ref 101–111)
CO2: 27 mmol/L (ref 22–32)
CREATININE: 1.36 mg/dL — AB (ref 0.61–1.24)
Calcium: 8.5 mg/dL — ABNORMAL LOW (ref 8.9–10.3)
Glucose, Bld: 109 mg/dL — ABNORMAL HIGH (ref 65–99)
POTASSIUM: 3.6 mmol/L (ref 3.5–5.1)
SODIUM: 139 mmol/L (ref 135–145)

## 2015-04-02 LAB — CBC
HCT: 37.4 % — ABNORMAL LOW (ref 40.0–52.0)
Hemoglobin: 11.9 g/dL — ABNORMAL LOW (ref 13.0–18.0)
MCH: 26.7 pg (ref 26.0–34.0)
MCHC: 31.9 g/dL — ABNORMAL LOW (ref 32.0–36.0)
MCV: 83.7 fL (ref 80.0–100.0)
PLATELETS: 217 10*3/uL (ref 150–440)
RBC: 4.47 MIL/uL (ref 4.40–5.90)
RDW: 16.8 % — ABNORMAL HIGH (ref 11.5–14.5)
WBC: 11.8 10*3/uL — AB (ref 3.8–10.6)

## 2015-04-02 MED ORDER — HYDRALAZINE HCL 50 MG PO TABS
50.0000 mg | ORAL_TABLET | Freq: Three times a day (TID) | ORAL | Status: DC
  Administered 2015-04-02 – 2015-04-03 (×4): 50 mg via ORAL
  Filled 2015-04-02 (×4): qty 1

## 2015-04-02 MED ORDER — REGADENOSON 0.4 MG/5ML IV SOLN
0.4000 mg | Freq: Once | INTRAVENOUS | Status: AC
Start: 2015-04-02 — End: 2015-04-02
  Administered 2015-04-02: 0.4 mg via INTRAVENOUS

## 2015-04-02 MED ORDER — LISINOPRIL 20 MG PO TABS
40.0000 mg | ORAL_TABLET | Freq: Every day | ORAL | Status: DC
  Administered 2015-04-02 – 2015-04-03 (×2): 40 mg via ORAL
  Filled 2015-04-02 (×2): qty 2

## 2015-04-02 MED ORDER — IPRATROPIUM-ALBUTEROL 0.5-2.5 (3) MG/3ML IN SOLN
3.0000 mL | Freq: Three times a day (TID) | RESPIRATORY_TRACT | Status: DC
  Administered 2015-04-03: 3 mL via RESPIRATORY_TRACT
  Filled 2015-04-02: qty 3

## 2015-04-02 MED ORDER — TECHNETIUM TC 99M SESTAMIBI - CARDIOLITE
30.0000 | Freq: Once | INTRAVENOUS | Status: AC | PRN
  Administered 2015-04-02: 29.57 via INTRAVENOUS

## 2015-04-02 NOTE — Progress Notes (Signed)
[facility] Physicians - [place] at [facility]                                                                                                                                                                                            Patient Demographics   Philip Monroe, is a 40 y.o. male, DOB - 12-18-1975, ZOX:[id number]  Admit date - 03/31/2015   Admitting Physician Ramonita Lab, MD  Outpatient Primary MD for the patient is [facility]   LOS - 2  Subjective:  Swelling continues to improve shortness of breath improved     Review of Systems:   CONSTITUTIONAL: No documented fever. No fatigue, weakness. No weight gain, no weight loss.  EYES: No blurry or double vision.  ENT: No tinnitus. No postnasal drip. No redness of the oropharynx.  RESPIRATORY: No cough, no wheeze, no hemoptysis. Positive dyspnea.  CARDIOVASCULAR: No chest pain. No orthopnea. No palpitations. No syncope.  GASTROINTESTINAL: No nausea, no vomiting or diarrhea. No abdominal pain. No melena or hematochezia.  GENITOURINARY: No dysuria or hematuria.  ENDOCRINE: No polyuria or nocturia. No heat or cold intolerance.  HEMATOLOGY: No anemia. No bruising. No bleeding.  INTEGUMENTARY: No rashes. No lesions.  MUSCULOSKELETAL: No arthritis. No swelling. No gout.  NEUROLOGIC: No numbness, tingling, or ataxia. No seizure-type activity.  PSYCHIATRIC: No anxiety. No insomnia. No ADD.    Vitals:   Filed Vitals:   04/02/15 0355 04/02/15 1001 04/02/15 1109 04/02/15 1333  BP: 156/94 181/136 154/84   Pulse:  82 72   Temp:   98.3 F (36.8 C)   TempSrc:   Oral   Resp:   18   Height:      Weight:      SpO2: 98%  96% 96%    Wt Readings from Last 3 Encounters:  04/02/15 151.728 kg (334 lb 8 oz)     Intake/Output Summary (Last 24 hours) at 04/02/15 1431 Last data filed at 04/02/15 0122  Gross per 24 hour  Intake      0 ml  Output      0 ml  Net      0 ml    Physical Exam:   GENERAL:  Pleasant-appearing in no apparent distress.  HEAD, EYES, EARS, NOSE AND THROAT: Atraumatic, normocephalic. Extraocular muscles are intact. Pupils equal and reactive to light. Sclerae anicteric. No conjunctival injection. No oro-pharyngeal erythema.  NECK: Supple. There is no jugular venous distention. No bruits, no lymphadenopathy, no thyromegaly.  HEART: Regular rate and rhythm,. No murmurs, no rubs, no clicks.  LUNGS: Clear to auscultation bilaterally. No rales or rhonchi. No wheezes.  ABDOMEN: Soft, flat, nontender, nondistended. Has good bowel sounds. No hepatosplenomegaly appreciated.  EXTREMITIES: No evidence of any cyanosis, clubbing, or peripheral edema.  +2 pedal and radial pulses bilaterally.  NEUROLOGIC: The patient is alert, awake, and oriented x3 with no focal motor or sensory deficits appreciated bilaterally.  SKIN: Moist and warm with no rashes appreciated.  Psych: Not anxious, depressed LN: No inguinal LN enlargement    Antibiotics   Anti-infectives    None      Medications   Scheduled Meds: . aspirin EC  81 mg Oral Daily  . enoxaparin (LOVENOX) injection  40 mg Subcutaneous Q12H  . folic acid  1 mg Oral Daily  . furosemide  20 mg Oral BID  . hydrALAZINE  50 mg Oral 3 times per day  . ipratropium-albuterol  3 mL Nebulization Q6H  . lisinopril  40 mg Oral Daily  . metoprolol tartrate  100 mg Oral BID  . multivitamin with minerals  1 tablet Oral Daily  . multivitamin with minerals  1 tablet Oral Daily  . nicotine  21 mg Transdermal Daily  . simvastatin  20 mg Oral q1800  . thiamine  100 mg Oral Daily   Or  . thiamine  100 mg Intravenous Daily   Continuous Infusions:  PRN Meds:.acetaminophen, guaiFENesin, labetalol, LORazepam **OR** LORazepam, nitroGLYCERIN, ondansetron (ZOFRAN) IV   Data Review:   Micro Results No results found for this or any previous visit (from the past 240 hour(s)).  Radiology Reports Dg Chest 2 View  03/31/2015  CLINICAL DATA:   Cough.  Short of breath.  History of hypertension. EXAM: CHEST  2 VIEW COMPARISON:  None. FINDINGS: Cardiac silhouette is mildly enlarged. No mediastinal or hilar masses or convincing adenopathy. There is bilateral interstitial thickening. There is no focal lung consolidation to suggest pneumonia. No pleural effusion or pneumothorax. Bony thorax is unremarkable. IMPRESSION: 1. Cardiomegaly with interstitial thickening consistent with mild congestive heart failure. Electronically Signed   By: Amie Portland M.D.   On: 03/31/2015 18:03     CBC  Recent Labs Lab 03/31/15 1626 04/01/15 0519 04/02/15 0523  WBC 11.7* 10.2 11.8*  HGB 12.8* 12.2* 11.9*  HCT 40.2 38.5* 37.4*  PLT 237 210 217  MCV 83.8 84.5 83.7  MCH 26.7 26.8 26.7  MCHC 31.9* 31.7* 31.9*  RDW 16.6* 16.6* 16.8*    Chemistries   Recent Labs Lab 03/31/15 1626 04/01/15 0519 04/02/15 0523  NA 137 140 139  K 3.7 4.1 3.6  CL 105 105 104  CO2 GLUCOSE 96 117* 109*  BUN 19 22* 22*  CREATININE 1.23 1.39* 1.36*  CALCIUM 8.7* 8.9 8.5*   ------------------------------------------------------------------------------------------------------------------ estimated creatinine clearance is 116 mL/min (by C-G formula based on Cr of 1.36). ------------------------------------------------------------------------------------------------------------------ No results for input(s): HGBA1C in the last 72 hours. ------------------------------------------------------------------------------------------------------------------  Recent Labs  04/01/15 0519  CHOL 138  HDL 28*  LDLCALC 87  TRIG 161  CHOLHDL 4.9   ------------------------------------------------------------------------------------------------------------------  Recent Labs  04/01/15 0519  TSH 3.250   ------------------------------------------------------------------------------------------------------------------ No results for input(s): VITAMINB12, FOLATE,  FERRITIN, TIBC, IRON, RETICCTPCT in the last 72 hours.  Coagulation profile No results for input(s): INR, PROTIME in the last 168 hours.  No results for input(s): DDIMER in the last 72 hours.  Cardiac Enzymes  Recent Labs Lab 03/31/15 2202 04/01/15 0519 04/01/15 1016  TROPONINI 0.10* 0.08* 0.08*   ------------------------------------------------------------------------------------------------------------------ Invalid input(s): POCBNP    Assessment & Plan   1. Chest pain with abnormal  troponin  Stress test part one done in part to to be done tomorrow due to his weight  2. 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 Alcohol abuse- no evidence of withdrawal ciwa protocol Patient will be benefited with outpatient alcohol rehabilitation  5. Tobacco abuse disorder Counseled by admiting md      Code Status Orders        Start     Ordered   03/31/15 2048  Full code   Continuous     03/31/15 2047    Code Status History    Date Active Date Inactive Code Status Order ID Comments User Context   This patient has a current code status but no historical code status.           Consults   DVT Prophylaxis  Lovenox    Lab Results  Component Value Date   PLT 217 04/02/2015     Time Spent in minutes   Auburn Bilberry M.D on 04/02/2015 at 2:31 PM  Between 7am to 6pm - Pager - [phone]  After 6pm go to [email or web address] - password [email or web address] [facility]  [facility]   Office  [phone]

## 2015-04-02 NOTE — Progress Notes (Signed)
"  Living better with heart failure" booklet printed and given to patient and mother, reviewed at bedside. Pt encouraged to take test at the back of the booklet. Reports no questions as of right now.

## 2015-04-02 NOTE — Progress Notes (Signed)
PRN labetalol given for the following BP: Filed Vitals:   04/02/15 0355 04/02/15 1001  BP: 156/94 181/136  Pulse:  82  Temp:    Resp:     MD aware. All POs given at time of blood pressure reading, will recheck in an hour.

## 2015-04-02 NOTE — Progress Notes (Signed)
Per nuc med, pt stress test is 2-day as pt is >300lb. Dr. Allena Katz updated.

## 2015-04-02 NOTE — Progress Notes (Signed)
Initial appointment made at the [facility] on April 20, 2015 at 9:30am. Thank you.

## 2015-04-03 ENCOUNTER — Encounter: Payer: MEDICAID | Attending: Cardiology

## 2015-04-03 DIAGNOSIS — R079 Chest pain, unspecified: Secondary | ICD-10-CM | POA: Insufficient documentation

## 2015-04-03 LAB — NM MYOCAR MULTI W/SPECT W/WALL MOTION / EF
CHL CUP NUCLEAR SRS: 8
CHL CUP STRESS STAGE 1 HR: 84 {beats}/min
CHL CUP STRESS STAGE 1 SPEED: 0 mph
CHL CUP STRESS STAGE 2 GRADE: 0 %
CHL CUP STRESS STAGE 2 SPEED: 0 mph
CHL CUP STRESS STAGE 3 GRADE: 0 %
CHL CUP STRESS STAGE 4 HR: 73 {beats}/min
CHL CUP STRESS STAGE 4 SPEED: 0 mph
CHL CUP STRESS STAGE 5 GRADE: 0 %
CHL CUP STRESS STAGE 6 DBP: 105 mmHg
CHL CUP STRESS STAGE 6 GRADE: 0 %
CHL CUP STRESS STAGE 6 HR: 87 {beats}/min
CHL CUP STRESS STAGE 6 SBP: 176 mmHg
CHL CUP STRESS STAGE 6 SPEED: 0 mph
CSEPED: 1 min
CSEPEDS: 0 s
CSEPPHR: 73 {beats}/min
Estimated workload: 1 METS
LV dias vol: 437 mL
LV sys vol: 303 mL
Percent of predicted max HR: 40 %
Rest HR: 82 {beats}/min
SDS: 9
SSS: 15
Stage 1 Grade: 0 %
Stage 2 HR: 79 {beats}/min
Stage 3 HR: 79 {beats}/min
Stage 3 Speed: 0 mph
Stage 4 Grade: 0 %
Stage 5 HR: 93 {beats}/min
Stage 5 Speed: 0 mph
TID: 1.17

## 2015-04-03 MED ORDER — HYDRALAZINE HCL 100 MG PO TABS: 100.0000 mg | ORAL_TABLET | Freq: Three times a day (TID) | ORAL | Status: DC

## 2015-04-03 MED ORDER — FUROSEMIDE 20 MG PO TABS: 20.0000 mg | ORAL_TABLET | Freq: Two times a day (BID) | ORAL | Status: DC

## 2015-04-03 MED ORDER — TECHNETIUM TC 99M SESTAMIBI - CARDIOLITE
30.0000 | Freq: Once | INTRAVENOUS | Status: AC | PRN
  Administered 2015-04-03: 08:00:00 32.4 via INTRAVENOUS

## 2015-04-03 MED ORDER — METOPROLOL TARTRATE 100 MG PO TABS: 100.0000 mg | ORAL_TABLET | Freq: Two times a day (BID) | ORAL | Status: DC

## 2015-04-03 MED ORDER — LISINOPRIL 40 MG PO TABS: 40.0000 mg | ORAL_TABLET | Freq: Every day | ORAL | Status: DC

## 2015-04-03 MED ORDER — SIMVASTATIN 20 MG PO TABS: 20.0000 mg | ORAL_TABLET | Freq: Every day | ORAL | Status: DC

## 2015-04-03 MED ORDER — HYDRALAZINE HCL 50 MG PO TABS
100.0000 mg | ORAL_TABLET | Freq: Three times a day (TID) | ORAL | Status: DC
  Administered 2015-04-03: 100 mg via ORAL
  Filled 2015-04-03: qty 2

## 2015-04-03 NOTE — Discharge Instructions (Signed)
[facility] appointment on April 20, 2015 at 9:30am with [name], FNP. Please call [phone] to reschedule.   DIET:  Cardiac diet  DISCHARGE CONDITION:  Stable  ACTIVITY:  Activity as tolerated  OXYGEN:  Home Oxygen: No.   Oxygen Delivery: room air  DISCHARGE LOCATION:  home    ADDITIONAL DISCHARGE INSTRUCTION:   If you experience worsening of your admission symptoms, develop shortness of breath, life threatening emergency, suicidal or homicidal thoughts you must seek medical attention immediately by calling 911 or calling your MD immediately  if symptoms less severe.  You Must read complete instructions/literature along with all the possible adverse reactions/side effects for all the Medicines you take and that have been prescribed to you. Take any new Medicines after you have completely understood and accpet all the possible adverse reactions/side effects.   Please note  You were cared for by a hospitalist during your hospital stay. If you have any questions about your discharge medications or the care you received while you were in the hospital after you are discharged, you can call the unit and asked to speak with the hospitalist on call if the hospitalist that took care of you is not available. Once you are discharged, your primary care physician will handle any further medical issues. Please note that NO REFILLS for any discharge medications will be authorized once you are discharged, as it is imperative that you return to your primary care physician (or establish a relationship with a primary care physician if you do not have one) for your aftercare needs so that they can reassess your need for medications and monitor your lab values.

## 2015-04-03 NOTE — Discharge Summary (Signed)
Philip Monroe, 40 y.o., DOB [birth year], MRN [id number]. Admission date: 03/31/2015 Discharge Date 04/03/2015 Primary MD [facility] Admitting Physician Ramonita Lab, MD  Admission Diagnosis  Shortness of breath [R06.02] Troponin level elevated [R79.89] Chest pain, unspecified chest pain type [R07.9]  Discharge Diagnosis   Active Problems:   Acute systolic CHF (congestive heart failure) (HCC) Accelerated hypertension Elevated troponin due to demand ischemia status post Lexi stress test Etoh abuse Medication noncompliance Morbid obesity       Hospital Course Philip Monroe is a 40 y.o. male with a known history of essential hypertension not on any home medications, morbid obesity, drinks beer heavily on daily basis admits 12 beers per day and continues to smoke is presenting to the ED with a chief complaint of one-week history of midsternal chest pain associated with shortness of breath and lower extent is swelling for the past to 3 days.  Patient was seen in the ER and was noted to have accelerated hypertension. His troponin was mildly elevated as well. He had findings consistent with CHF. He was admitted to the telemetry floor. And was treated with Lasix his blood pressure was hard to control. He was seen by cardiology due to his weight he underwent 2 day Lexiscan. Which per cardiology if was felt to be low risk. Patient has no further chest pains his breathing is improved he is strongly recommended to stop drinking. And be compliant with his medications. He'll need a close follow-up with [facility] as well as the cardiologist.         Consults  cardiology  Significant Tests:  See full reports for all details      Dg Chest 2 View  03/31/2015  CLINICAL DATA:  Cough.  Short of breath.  History of hypertension. EXAM: CHEST  2 VIEW COMPARISON:  None. FINDINGS: Cardiac silhouette is mildly enlarged. No mediastinal or hilar masses or convincing  adenopathy. There is bilateral interstitial thickening. There is no focal lung consolidation to suggest pneumonia. No pleural effusion or pneumothorax. Bony thorax is unremarkable. IMPRESSION: 1. Cardiomegaly with interstitial thickening consistent with mild congestive heart failure. Electronically Signed   By: Amie Portland M.D.   On: 03/31/2015 18:03   Nm Myocar Multi W/spect W/wall Motion / Ef  04/03/2015   There was no ST segment deviation noted during stress.  moderate global hypo EF= 35%  persistent apical defect probably no ischemia  Defect 1: There is a small defect of mild severity present in the apex location.  This is a low risk study.  The left ventricular ejection fraction is moderately decreased (30-44%).  Nuclear stress EF: 35%.   Conclusion  mild apical thinning and defect no clear ischemia  ejection fraction around 35% globally depressed       Today   Subjective:   Philip [facility]  feels better breathing improved  Objective:   Blood pressure 141/104, pulse 74, temperature 98 F (36.7 C), temperature source Oral, resp. rate 20, height  (1.905 m), weight 151.139 kg (333 lb 3.2 oz), SpO2 98 %.  .  Intake/Output Summary (Last 24 hours) at 04/03/15 1635 Last data filed at 04/03/15 1000  Gross per 24 hour  Intake    480 ml  Output    950 ml  Net   -470 ml    Exam VITAL SIGNS: Blood pressure 141/104, pulse 74, temperature 98 F (36.7 C), temperature source Oral, resp. rate 20, height  (1.905 m), weight 151.139  kg (333 lb 3.2 oz), SpO2 98 %.  GENERAL:  40 y.o.-year-old patient lying in the bed with no acute distress.  EYES: Pupils equal, round, reactive to light and accommodation. No scleral icterus. Extraocular muscles intact.  HEENT: Head atraumatic, normocephalic. Oropharynx and nasopharynx clear.  NECK:  Supple, no jugular venous distention. No thyroid enlargement, no tenderness.  LUNGS: Normal breath sounds bilaterally, no wheezing, rales,rhonchi or  crepitation. No use of accessory muscles of respiration.  CARDIOVASCULAR: S1, S2 normal. No murmurs, rubs, or gallops.  ABDOMEN: Soft, nontender, nondistended. Bowel sounds present. No organomegaly or mass.  EXTREMITIES: No pedal edema, cyanosis, or clubbing.  NEUROLOGIC: Cranial nerves II through XII are intact. Muscle strength 5/5 in all extremities. Sensation intact. Gait not checked.  PSYCHIATRIC: The patient is alert and oriented x 3.  SKIN: No obvious rash, lesion, or ulcer.   Data Review     CBC w Diff: Lab Results  Component Value Date   WBC 11.8* 04/02/2015   HGB 11.9* 04/02/2015   HCT 37.4* 04/02/2015   PLT 217 04/02/2015   CMP: Lab Results  Component Value Date   NA 139 04/02/2015   K 3.6 04/02/2015   CL 104 04/02/2015   CO2 27 04/02/2015   BUN 22* 04/02/2015   CREATININE 1.36* 04/02/2015  .  Micro Results No results found for this or any previous visit (from the past 240 hour(s)).      Code Status Orders        Start     Ordered   03/31/15 2048  Full code   Continuous     03/31/15 2047    Code Status History    Date Active Date Inactive Code Status Order ID Comments User Context   This patient has a current code status but no historical code status.          Follow-up Information    Follow up with Delma Freeze, FNP. Go on 04/20/2015.   Specialty:  Family Medicine   Why:  at 9:30am , to the [facility]   Contact information:   [name] [place] [address] [phone]       Follow up with [facility] In 7 days.   Specialty:  General Practice   Why:  Wednesday, January 25th and 340pm, ccs   Contact information:   [address] [place] [address] [phone]       Follow up with Marcina Millard, MD.   Specialty:  Cardiology   Why:  Monday, January 30th at 345pm, ccs   Contact information:   [address] [facility] [place]  [address] [phone]       Discharge Medications     Medication List    TAKE these medications        furosemide 20 MG tablet  Commonly known as:  LASIX  Take 1 tablet (20 mg total) by mouth 2 (two) times daily.     guaiFENesin 100 MG/5ML Soln  Commonly known as:  ROBITUSSIN  Take 5 mLs by mouth every 4 (four) hours as needed for cough.     guaiFENesin 600 MG 12 hr tablet  Commonly known as:  MUCINEX  Take 600 mg by mouth 2 (two) times daily as needed for cough or to loosen phlegm.     hydrALAZINE 100 MG tablet  Commonly known as:  APRESOLINE  Take 1 tablet (100 mg total) by mouth every 8 (eight) hours.  lisinopril 40 MG tablet  Commonly known as:  PRINIVIL,ZESTRIL  Take 1 tablet (40 mg total) by mouth daily.     metoprolol 100 MG tablet  Commonly known as:  LOPRESSOR  Take 1 tablet (100 mg total) by mouth 2 (two) times daily.     multivitamin with minerals Tabs tablet  Take 1 tablet by mouth daily.     simvastatin 20 MG tablet  Commonly known as:  ZOCOR  Take 1 tablet (20 mg total) by mouth daily.           Total Time in preparing paper work, data evaluation and todays exam - 35 minutes  Auburn Bilberry M.D on 04/03/2015 at 4:35 PM  [facility]   Office  [phone]

## 2015-04-20 ENCOUNTER — Encounter: Payer: Self-pay | Admitting: Family

## 2015-04-20 ENCOUNTER — Ambulatory Visit: Payer: Medicaid Other | Attending: Family | Admitting: Family

## 2015-04-20 VITALS — BP 160/110 | HR 67 | Resp 20 | Ht 75.0 in | Wt 329.0 lb

## 2015-04-20 DIAGNOSIS — Z79899 Other long term (current) drug therapy: Secondary | ICD-10-CM | POA: Insufficient documentation

## 2015-04-20 DIAGNOSIS — I5022 Chronic systolic (congestive) heart failure: Secondary | ICD-10-CM | POA: Insufficient documentation

## 2015-04-20 DIAGNOSIS — Z789 Other specified health status: Secondary | ICD-10-CM | POA: Insufficient documentation

## 2015-04-20 DIAGNOSIS — I110 Hypertensive heart disease with heart failure: Secondary | ICD-10-CM | POA: Diagnosis not present

## 2015-04-20 DIAGNOSIS — Z87891 Personal history of nicotine dependence: Secondary | ICD-10-CM | POA: Diagnosis not present

## 2015-04-20 DIAGNOSIS — Z7289 Other problems related to lifestyle: Secondary | ICD-10-CM | POA: Insufficient documentation

## 2015-04-20 DIAGNOSIS — Z7982 Long term (current) use of aspirin: Secondary | ICD-10-CM | POA: Insufficient documentation

## 2015-04-20 DIAGNOSIS — I10 Essential (primary) hypertension: Secondary | ICD-10-CM | POA: Insufficient documentation

## 2015-04-20 NOTE — Patient Instructions (Addendum)
Begin weighing daily and call for an overnight weight gain of > 2 pounds or a weekly weight gain of >5 pounds. 

## 2015-04-20 NOTE — Progress Notes (Signed)
Subjective:    Patient ID: Philip Monroe, male    DOB: 1975/09/19, 40 y.o.   MRN: [id number]  Congestive Heart Failure Presents for initial visit. The disease course has been improving. Associated symptoms include fatigue (better). Pertinent negatives include no abdominal pain, chest pain, edema, muscle weakness, orthopnea, palpitations or shortness of breath. The symptoms have been improving. Past treatments include ACE inhibitors, beta blockers and salt and fluid restriction. The treatment provided moderate relief. Compliance with prior treatments has been good. His past medical history is significant for HTN. There is no history of CAD or DM. Compliance with total regimen is 76-100%.  Hypertension This is a chronic problem. The current episode started more than 1 month ago. The problem has been waxing and waning since onset. Pertinent negatives include no chest pain, headaches, neck pain, palpitations, peripheral edema or shortness of breath. Risk factors for coronary artery disease include obesity and male gender. Past treatments include ACE inhibitors, beta blockers, diuretics and lifestyle changes. The current treatment provides moderate improvement. Hypertensive end-organ damage includes heart failure.    Past Medical History  Diagnosis Date  . Hypertension   . CHF (congestive heart failure) (HCC)     History reviewed. No pertinent past surgical history.  History reviewed. No pertinent family history.  Social History  Substance Use Topics  . Smoking status: Former Smoker -- 1.00 packs/day    Types: Cigarettes    Quit date: 03/20/2015  . Smokeless tobacco: Never Used  . Alcohol Use: Yes     Comment: 3 - 32 oz beers per day    Allergies  Allergen Reactions  . Lactose Intolerance (Gi) Diarrhea    Prior to Admission medications   Medication Sig Start Date End Date Taking? Authorizing Provider  aspirin 325 MG tablet Take 325 mg by mouth daily.   Yes Historical Provider, MD   furosemide (LASIX) 20 MG tablet Take 1 tablet (20 mg total) by mouth 2 (two) times daily. 04/03/15  Yes Auburn Bilberry, MD  hydrALAZINE (APRESOLINE) 100 MG tablet Take 1 tablet (100 mg total) by mouth every 8 (eight) hours. 04/03/15  Yes Auburn Bilberry, MD  lisinopril (PRINIVIL,ZESTRIL) 40 MG tablet Take 1 tablet (40 mg total) by mouth daily. 04/03/15  Yes Auburn Bilberry, MD  metoprolol (LOPRESSOR) 100 MG tablet Take 1 tablet (100 mg total) by mouth 2 (two) times daily. 04/03/15  Yes Auburn Bilberry, MD  Multiple Vitamin (MULTIVITAMIN WITH MINERALS) TABS tablet Take 1 tablet by mouth daily.   Yes Historical Provider, MD  simvastatin (ZOCOR) 20 MG tablet Take 1 tablet (20 mg total) by mouth daily. 04/03/15  Yes Auburn Bilberry, MD  vitamin B-12 (CYANOCOBALAMIN) 1000 MCG tablet Take 1,000 mcg by mouth daily.   Yes Historical Provider, MD     Review of Systems  Constitutional: Positive for fatigue (better). Negative for appetite change.  HENT: Negative for congestion, rhinorrhea and sore throat.   Eyes: Negative.   Respiratory: Positive for cough. Negative for chest tightness and shortness of breath.   Cardiovascular: Negative for chest pain, palpitations and leg swelling.  Gastrointestinal: Negative for abdominal pain and abdominal distention.  Endocrine: Negative.   Genitourinary: Negative.   Musculoskeletal: Negative for back pain, muscle weakness and neck pain.  Skin: Negative.   Allergic/Immunologic: Negative.   Neurological: Negative for dizziness, light-headedness and headaches.  Hematological: Negative for adenopathy. Does not bruise/bleed easily.  Psychiatric/Behavioral: Negative for sleep disturbance (sleeping on 2 pillows) and dysphoric mood. The patient is not nervous/anxious.  Objective:   Physical Exam  Constitutional: He is oriented to person, place, and time. He appears well-developed and well-nourished.  HENT:  Head: Normocephalic and atraumatic.  Eyes: Conjunctivae  are normal. Pupils are equal, round, and reactive to light.  Neck: Normal range of motion. Neck supple.  Cardiovascular: Normal rate and regular rhythm.   No murmur heard. Pulmonary/Chest: Effort normal. He has no wheezes. He has no rales.  Abdominal: Soft. He exhibits no distension. There is no tenderness.  Musculoskeletal: He exhibits no edema or tenderness.  Neurological: He is alert and oriented to person, place, and time.  Skin: Skin is warm and dry.  Psychiatric: He has a normal mood and affect. His behavior is normal. Thought content normal.  Nursing note and vitals reviewed.   BP 160/110 mmHg  Pulse 67  Resp 20  Ht  (1.905 m)  Wt 329 lb (149.233 kg)  BMI 41.12 kg/m2  SpO2 99%       Assessment & Plan:  1: Chronic heart failure with reduced ejection fraction- Patient presents with a mild amount of fatigue upon exertion although he feels like that is improving. He denies any shortness of breath or swelling in his legs or abdomen. He hasn't been weighing himself as he doesn't have any scales so a set of scales was given to him today. He was instructed to call for an overnight weight gain of >2 pounds or a weekly weight gain of >5 pounds. He is not adding any salt to his food and has been reviewing food labels. He tries to avoid canned/processed foods. He does eat out often because he works in [employer] but does try to make low sodium choices if able. Reviewed a  sodium diet and written information was given to him about this. Discussed possibly changing his lisinopril to entresto at his next visit and changing his hydralazine to bidil in the future as well. Written information about both medications were given to him. [facility] PharmD went in and reviewed medications with the patient.  2: HTN- Blood pressure is elevated even upon recheck. When he went to his cardiology appointment a few weeks ago, it was normal. He does admit to drinking quite a bit yesterday while watching the  super bowl and says that his blood pressure is usually ok. Will continue to monitor. 3: Alcohol use- Patient says that he has reduced his alcohol use as he used to drink heavily every day but now he's drinking only on the weekends. He says that he drinks about a 12 pack of beer daily on the weekend. Discussed the importance of continuing to decrease his consumption and to account for the sodium content in the beer. Has recently quit smoking.   Return here in 1 month or sooner for any questions/problems before then.

## 2015-05-18 ENCOUNTER — Ambulatory Visit: Payer: Medicaid Other | Attending: Family | Admitting: Family

## 2015-05-18 ENCOUNTER — Encounter: Payer: Self-pay | Admitting: Family

## 2015-05-18 VITALS — BP 182/129 | HR 64 | Resp 18 | Ht 75.0 in | Wt 330.0 lb

## 2015-05-18 DIAGNOSIS — Z7289 Other problems related to lifestyle: Secondary | ICD-10-CM

## 2015-05-18 DIAGNOSIS — I5022 Chronic systolic (congestive) heart failure: Secondary | ICD-10-CM | POA: Insufficient documentation

## 2015-05-18 DIAGNOSIS — Z87891 Personal history of nicotine dependence: Secondary | ICD-10-CM | POA: Diagnosis not present

## 2015-05-18 DIAGNOSIS — Z7982 Long term (current) use of aspirin: Secondary | ICD-10-CM | POA: Insufficient documentation

## 2015-05-18 DIAGNOSIS — I10 Essential (primary) hypertension: Secondary | ICD-10-CM | POA: Insufficient documentation

## 2015-05-18 DIAGNOSIS — R079 Chest pain, unspecified: Secondary | ICD-10-CM | POA: Diagnosis not present

## 2015-05-18 DIAGNOSIS — Z79899 Other long term (current) drug therapy: Secondary | ICD-10-CM | POA: Diagnosis not present

## 2015-05-18 DIAGNOSIS — Z789 Other specified health status: Secondary | ICD-10-CM

## 2015-05-18 MED ORDER — FUROSEMIDE 20 MG PO TABS: 20.0000 mg | ORAL_TABLET | Freq: Two times a day (BID) | ORAL | Status: DC

## 2015-05-18 MED ORDER — ISOSORB DINITRATE-HYDRALAZINE 20-37.5 MG PO TABS: 1.0000 | ORAL_TABLET | Freq: Three times a day (TID) | ORAL | Status: DC

## 2015-05-18 NOTE — Patient Instructions (Signed)
Continue weighing daily and call for an overnight weight gain of > 2 pounds or a weekly weight gain of >5 pounds. 

## 2015-05-18 NOTE — Progress Notes (Signed)
Subjective:    Patient ID: Philip Monroe, male    DOB: 10/30/1975, 40 y.o.   MRN: [id number]  Congestive Heart Failure Presents for follow-up visit. The disease course has been stable. Associated symptoms include chest pain and fatigue. Pertinent negatives include no abdominal pain, edema, orthopnea, palpitations or shortness of breath. The symptoms have been stable. Past treatments include ACE inhibitors, beta blockers and salt and fluid restriction. The treatment provided moderate relief. Compliance with prior treatments has been good. His past medical history is significant for HTN. There is no history of CVA or DM. Compliance with total regimen is 51-75%. Compliance with exercise is 51-75%.  Chest Pain  This is a recurrent problem. The current episode started more than 1 month ago. The onset quality is sudden. The problem occurs intermittently. The problem has been unchanged. The pain is present in the substernal region. The pain is at a severity of 2/10. The pain is mild. The quality of the pain is described as dull. The pain does not radiate. Associated symptoms include malaise/fatigue. Pertinent negatives include no abdominal pain, back pain, cough, dizziness, headaches, irregular heartbeat, nausea, numbness, palpitations or shortness of breath. 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 He has tried rest for the symptoms. The treatment provided significant relief. Risk factors include alcohol intake.  His past medical history is significant for CHF, HTN and hypertension.  Pertinent negatives for past medical history include no COPD, no DM and no MI.   Past Medical History  Diagnosis Date  . Hypertension   . CHF (congestive heart failure) (HCC)     History reviewed. No pertinent past surgical history.  History reviewed. No pertinent family history.  Social History  Substance Use Topics  . Smoking status: Former Smoker -- 1.00 packs/day    Types: Cigarettes    Quit date: 03/20/2015  . Smokeless tobacco: Never Used  . Alcohol Use: Yes     Comment: 3 - 32 oz beers per day    Allergies  Allergen Reactions  . Lactose Intolerance (Gi) Diarrhea    Prior to Admission medications   Medication Sig Start Date End Date Taking? Authorizing Provider  aspirin 325 MG tablet Take 325 mg by mouth daily.   Yes Historical Provider, MD  furosemide (LASIX) 20 MG tablet Take 1 tablet (20 mg total) by mouth 2 (two) times daily. 05/18/15  Yes Delma Freezeina A Diantha Paxson, FNP  lisinopril (PRINIVIL,ZESTRIL) 40 MG tablet Take 1 tablet (40 mg total) by mouth daily. 04/03/15  Yes Auburn BilberryShreyang Patel, MD  metoprolol (LOPRESSOR) 100 MG tablet Take 1 tablet (100 mg total) by mouth 2 (two) times daily. 04/03/15  Yes Auburn BilberryShreyang Patel, MD  Multiple Vitamin (MULTIVITAMIN WITH MINERALS) TABS tablet Take 1 tablet by mouth daily.   Yes Historical Provider, MD  simvastatin (ZOCOR) 20 MG tablet Take 1 tablet (20 mg total) by mouth daily. 04/03/15  Yes Auburn BilberryShreyang Patel, MD  vitamin B-12 (CYANOCOBALAMIN) 1000 MCG tablet Take 1,000 mcg by mouth daily.   Yes Historical Provider, MD  isosorbide-hydrALAZINE (BIDIL) 20-37.5 MG tablet Take 1 tablet by mouth 3 (three) times daily. 05/18/15   Delma Freezeina A Yao Hyppolite, FNP      Review of Systems  Constitutional: Positive for malaise/fatigue and fatigue. Negative for appetite change.  HENT: Positive for congestion (nasal). Negative for postnasal drip and sore throat.   Eyes: Negative.   Respiratory: Negative for cough, chest tightness and shortness of breath.   Cardiovascular: Positive for chest pain. Negative  for palpitations and leg swelling.  Gastrointestinal: Negative for nausea, abdominal pain and abdominal distention.  Endocrine: Negative.   Genitourinary: Negative.   Musculoskeletal: Negative for back pain and neck pain.  Skin: Negative.   Allergic/Immunologic: Negative.   Neurological: Negative for dizziness, light-headedness, numbness and headaches.  Hematological:  Negative for adenopathy. Does not bruise/bleed easily.  Psychiatric/Behavioral: Negative for sleep disturbance (sleeping on 2 pillows) and dysphoric mood. The patient is not nervous/anxious.        Objective:   Physical Exam  Constitutional: He is oriented to person, place, and time. He appears well-developed and well-nourished.  HENT:  Head: Normocephalic and atraumatic.  Eyes: Conjunctivae are normal. Pupils are equal, round, and reactive to light.  Neck: Normal range of motion. Neck supple.  Cardiovascular: Normal rate and regular rhythm.   Pulmonary/Chest: Effort normal. He has no wheezes. He has no rales.  Abdominal: Soft. He exhibits no distension. There is no tenderness.  Musculoskeletal: He exhibits no edema or tenderness.  Neurological: He is alert and oriented to person, place, and time.  Skin: Skin is warm and dry.  Psychiatric: He has a normal mood and affect. His behavior is normal. Thought content normal.  Nursing note and vitals reviewed.   BP 182/129 mmHg  Pulse 64  Resp 18  Ht  (1.905 m)  Wt 330 lb (149.687 kg)  BMI 41.25 kg/m2  SpO2 99%       Assessment & Plan:  1: Chronic heart failure with reduced ejection fraction- Patient presents with fatigue upon exertion (Class II) which is improved upon rest. He denies any shortness of breath or swelling in his legs or abdomen. He continues to weigh himself and reports a stable weight. By our scale, his weight is essentially unchanged. Reminded patient to call for an overnight weight gain of >2 pounds or a weekly weight gain of >5 pounds. He is not adding any salt to his food and tries to eat low sodium foods. Would like to change his lisinopril to entresto at his next office visit. Discussed [facility] with patient and a brochure was given to him about this. Encouraged him to call them to start the process of getting established since he currently doesn't have any insurance. Currently he's getting  his medications at [facility]. 2: HTN- Blood pressure elevated. I sent hydralazine in last time but sent it to [facility] of [facility]. Since his EF is low, will go ahead and begin bidil 1 tablet three times daily. This prescription was sent to [facility]. Most likely his chest tightness is related to his high blood pressure.  3: Alcohol use- Patient says that he continues to drink alcohol on the weekends only. Complete cessation was discussed for 3 minutes with him.   Return here in 3 weeks or sooner for any questions/problems before then.

## 2015-05-19 ENCOUNTER — Telehealth: Payer: Self-pay | Admitting: Family

## 2015-05-19 ENCOUNTER — Other Ambulatory Visit: Payer: Self-pay | Admitting: Family

## 2015-05-19 MED ORDER — HYDRALAZINE HCL 100 MG PO TABS: 100.0000 mg | ORAL_TABLET | Freq: Three times a day (TID) | ORAL | Status: DC

## 2015-05-19 NOTE — Telephone Encounter (Signed)
Received phone call from [facility] stating that his copay for the Bidil would be $100. Patient says that he can't afford this. Will cancel the bidil and resume his hydralazine 100mg  TID and this was explained to the patient. Also reminded him to call [facility] to get the process started so that we could switch him to bidil in the future to benefit his heart function. He is coming back here in 3 weeks.

## 2015-06-11 ENCOUNTER — Encounter: Payer: Self-pay | Admitting: Family

## 2015-06-11 ENCOUNTER — Ambulatory Visit: Payer: Medicaid Other | Attending: Family | Admitting: Family

## 2015-06-11 VITALS — BP 160/100 | HR 64 | Resp 20 | Ht 75.0 in | Wt 331.0 lb

## 2015-06-11 DIAGNOSIS — Z79899 Other long term (current) drug therapy: Secondary | ICD-10-CM | POA: Diagnosis not present

## 2015-06-11 DIAGNOSIS — I110 Hypertensive heart disease with heart failure: Secondary | ICD-10-CM | POA: Insufficient documentation

## 2015-06-11 DIAGNOSIS — I10 Essential (primary) hypertension: Secondary | ICD-10-CM

## 2015-06-11 DIAGNOSIS — Z789 Other specified health status: Secondary | ICD-10-CM

## 2015-06-11 DIAGNOSIS — Z7982 Long term (current) use of aspirin: Secondary | ICD-10-CM | POA: Insufficient documentation

## 2015-06-11 DIAGNOSIS — I2510 Atherosclerotic heart disease of native coronary artery without angina pectoris: Secondary | ICD-10-CM | POA: Diagnosis not present

## 2015-06-11 DIAGNOSIS — Z87891 Personal history of nicotine dependence: Secondary | ICD-10-CM | POA: Diagnosis not present

## 2015-06-11 DIAGNOSIS — Z91011 Allergy to milk products: Secondary | ICD-10-CM | POA: Insufficient documentation

## 2015-06-11 DIAGNOSIS — R0789 Other chest pain: Secondary | ICD-10-CM | POA: Diagnosis not present

## 2015-06-11 DIAGNOSIS — I5022 Chronic systolic (congestive) heart failure: Secondary | ICD-10-CM | POA: Diagnosis present

## 2015-06-11 DIAGNOSIS — Z7289 Other problems related to lifestyle: Secondary | ICD-10-CM

## 2015-06-11 DIAGNOSIS — R5383 Other fatigue: Secondary | ICD-10-CM | POA: Insufficient documentation

## 2015-06-11 NOTE — Progress Notes (Signed)
Subjective:    Patient ID: Philip Monroe, male    DOB: February 08, 1976, 40 y.o.   MRN: [id number]  Congestive Heart Failure Presents for follow-up visit. The disease course has been stable. Associated symptoms include chest pressure (tightness on occasion) and fatigue. Pertinent negatives include no abdominal pain, chest pain, edema, orthopnea, palpitations or shortness of breath. The symptoms have been stable. Past treatments include ACE inhibitors, beta blockers and salt and fluid restriction. The treatment provided moderate relief. Compliance with prior treatments has been variable. His past medical history is significant for CAD and HTN. There is no history of CVA or DM.  Hypertension This is a chronic problem. The current episode started more than 1 year ago. The problem is unchanged. The problem is uncontrolled. Pertinent negatives include no blurred vision, chest pain, headaches, neck pain, palpitations, peripheral edema or shortness of breath. There are no associated agents to hypertension. Risk factors for coronary artery disease include male gender, obesity and sedentary lifestyle. Past treatments include ACE inhibitors, beta blockers, diuretics and lifestyle changes. The current treatment provides mild improvement. Compliance problems include medication cost.  Hypertensive end-organ damage includes heart failure. There is no history of kidney disease or CVA.    Past Medical History  Diagnosis Date  . Hypertension   . CHF (congestive heart failure) (HCC)     History reviewed. No pertinent past surgical history.  History reviewed. No pertinent family history.  Social History  Substance Use Topics  . Smoking status: Former Smoker -- 1.00 packs/day    Types: Cigarettes    Quit date: 03/20/2015  . Smokeless tobacco: Never Used  . Alcohol Use: 21.6 oz/week    36 Cans of beer per week     Comment: 18 cans of beer on Friday and again on Saturday nights     Allergies  Allergen  Reactions  . Lactose Intolerance (Gi) Diarrhea    Prior to Admission medications   Medication Sig Start Date End Date Taking? Authorizing Provider  aspirin 325 MG tablet Take 325 mg by mouth daily.   Yes Historical Provider, MD  furosemide (LASIX) 20 MG tablet Take 1 tablet (20 mg total) by mouth 2 (two) times daily. 05/18/15  Yes Delma Freeze, FNP  hydrALAZINE (APRESOLINE) 100 MG tablet Take 1 tablet (100 mg total) by mouth every 8 (eight) hours. 05/19/15  Yes Delma Freeze, FNP  lisinopril (PRINIVIL,ZESTRIL) 40 MG tablet Take 1 tablet (40 mg total) by mouth daily. 04/03/15  Yes Auburn Bilberry, MD  metoprolol (LOPRESSOR) 100 MG tablet Take 1 tablet (100 mg total) by mouth 2 (two) times daily. 04/03/15  Yes Auburn Bilberry, MD  Multiple Vitamin (MULTIVITAMIN WITH MINERALS) TABS tablet Take 1 tablet by mouth daily.   Yes Historical Provider, MD  simvastatin (ZOCOR) 20 MG tablet Take 1 tablet (20 mg total) by mouth daily. 04/03/15  Yes Auburn Bilberry, MD  vitamin B-12 (CYANOCOBALAMIN) 1000 MCG tablet Take 1,000 mcg by mouth daily.   Yes Historical Provider, MD     Review of Systems  Constitutional: Positive for fatigue. Negative for appetite change.  HENT: Positive for congestion. Negative for postnasal drip and sore throat.   Eyes: Negative.  Negative for blurred vision.  Respiratory: Positive for chest tightness (with exertion at times). Negative for cough and shortness of breath.   Cardiovascular: Negative for chest pain, palpitations and leg swelling.  Gastrointestinal: Negative for abdominal pain and abdominal distention.  Endocrine: Negative.   Genitourinary: Negative.   Musculoskeletal: Negative  for back pain and neck pain.  Skin: Negative.   Allergic/Immunologic: Negative.   Neurological: Negative for dizziness, light-headedness and headaches.  Hematological: Negative for adenopathy. Does not bruise/bleed easily.  Psychiatric/Behavioral: Negative for sleep disturbance (sleeping on 2  pillows) and dysphoric mood. The patient is not nervous/anxious.        Objective:   Physical Exam  Constitutional: He is oriented to person, place, and time. He appears well-developed and well-nourished.  HENT:  Head: Normocephalic and atraumatic.  Eyes: Conjunctivae are normal. Pupils are equal, round, and reactive to light.  Neck: Normal range of motion. Neck supple.  Cardiovascular: Regular rhythm.  Bradycardia present.   Pulmonary/Chest: Effort normal. He has no wheezes. He has no rales.  Abdominal: Soft. He exhibits no distension. There is no tenderness.  Musculoskeletal: He exhibits no edema or tenderness.  Neurological: He is alert and oriented to person, place, and time.  Skin: Skin is warm and dry.  Psychiatric: He has a normal mood and affect. His behavior is normal. Thought content normal.  Nursing note and vitals reviewed.   BP 160/100 mmHg  Pulse 64  Resp 20  Ht 6\' 3"  (1.905 m)  Wt 331 lb (150.141 kg)  BMI 41.37 kg/m2  SpO2 100%       Assessment & Plan:  1: Chronic heart failure with reduced ejection fraction- Patient presents with fatigue upon exertion (Class II) which he says improves with rest. Denies any shortness of breath or swelling in his legs/abdomen. He continues to weigh himself daily and says that his weight has been stable. By our scale, his weight is unchanged. Reminded him to call for an overnight weight gain of >2 pounds or a weekly weight gain of >5 pounds. He is not adding any salt to his food and says that he tries to eat a low sodium diet. Wanted to place him on BiDil but it would cost too much at [facility] as patient currently doesn't have any insurance. Talked to him last time about calling [facility] and he hasn't done that yet. Another brochure was given to him and he was instructed to call them to get established. [facility] but, again, it will cost too much out of pocket which is why he needs to  get established with [facility]. [facility] and [facility] medications with the patient. Patient also has about 60 days worth left of Lisinopril as he gets a 90 day supply.  2: HTN- Blood pressure remains elevated although it was better when rechecked. Patient does admit that he often forgets to take the 2nd dose of hydralazine. He says that he takes it to work but then still doesn't take it. Encouraged him to set an alarm on his phone and discussed how important it was to take it three times daily. He says that his blood pressure was fine in the hospital & he doesn't understand why it's elevated. Discussed his alcohol use and that he didn't drink while he was in the hospital. Could increase his diuretic to see if that would help with his blood pressure but doubt he'd get too much of a response. 3: Alcohol use- He says that he only drinks on the weekends but that he'll drink about 18 twelve ounce cans of beer both Friday and Saturday nights. He denies driving after drinking as he's had a previous DUI. Discussed how his alcohol use could be contributing to his high blood pressure and  that his beer has sodium in it. Patient verbalizes understanding but doesn't express a desire to decrease consumption.   Return here in 1 month with the plan of switching his lisinopril to entresto. Can give him a 30 day voucher for the entresto.

## 2015-06-11 NOTE — Patient Instructions (Signed)
Continue weighing daily and call for an overnight weight gain of > 2 pounds or a weekly weight gain of >5 pounds. 

## 2015-07-13 ENCOUNTER — Ambulatory Visit: Payer: Self-pay | Admitting: Family

## 2017-07-24 ENCOUNTER — Emergency Department: Payer: Self-pay

## 2017-07-24 ENCOUNTER — Emergency Department
Admission: EM | Admit: 2017-07-24 | Discharge: 2017-07-24 | Disposition: A | Discharge status: Home / Self Care | Payer: Self-pay | Attending: Emergency Medicine | Admitting: Emergency Medicine

## 2017-07-24 ENCOUNTER — Encounter: Payer: Self-pay | Admitting: Emergency Medicine

## 2017-07-24 ENCOUNTER — Other Ambulatory Visit: Payer: Self-pay

## 2017-07-24 DIAGNOSIS — J069 Acute upper respiratory infection, unspecified: Secondary | ICD-10-CM | POA: Insufficient documentation

## 2017-07-24 DIAGNOSIS — Z7982 Long term (current) use of aspirin: Secondary | ICD-10-CM | POA: Insufficient documentation

## 2017-07-24 DIAGNOSIS — Z79899 Other long term (current) drug therapy: Secondary | ICD-10-CM | POA: Insufficient documentation

## 2017-07-24 DIAGNOSIS — I5022 Chronic systolic (congestive) heart failure: Secondary | ICD-10-CM | POA: Insufficient documentation

## 2017-07-24 DIAGNOSIS — I110 Hypertensive heart disease with heart failure: Secondary | ICD-10-CM | POA: Insufficient documentation

## 2017-07-24 DIAGNOSIS — F17210 Nicotine dependence, cigarettes, uncomplicated: Secondary | ICD-10-CM | POA: Insufficient documentation

## 2017-07-24 DIAGNOSIS — B9789 Other viral agents as the cause of diseases classified elsewhere: Secondary | ICD-10-CM

## 2017-07-24 MED ORDER — PSEUDOEPH-BROMPHEN-DM 30-2-10 MG/5ML PO SYRP: 5.0000 mL | ORAL_SOLUTION | Freq: Four times a day (QID) | ORAL | 0 refills | Status: DC | PRN

## 2017-07-24 NOTE — ED Provider Notes (Signed)
[facility] Emergency Department Provider Note   ____________________________________________   First MD Initiated Contact with Patient 07/24/17 1134     (approximate)  I have reviewed the triage vital signs and the nursing notes.   HISTORY  Chief Complaint Cough    HPI Philip Monroe is a 42 y.o. male patient complain of coughing for 2 weeks.  Patient stated the last several days cough is come productive of greenish sputum.  Patient also complain of chills but denies fever.  Patient had one vomiting episode yesterday secondary to cough.  Patient has a history of hypertension and congestive heart failure.  It was noted that patient blood pressure is elevated but he state he took  blood pressure medication just after triage.  Patient denies chest pain.  Patient admits to drinking approximately 36 beers per week.  Past Medical History:  Diagnosis Date  . CHF (congestive heart failure) (HCC)   . Hypertension     Patient Active Problem List   Diagnosis Date Noted  . Chronic systolic heart failure (HCC) 04/20/2015  . Benign essential HTN 04/20/2015  . Alcohol use 04/20/2015    History reviewed. No pertinent surgical history.  Prior to Admission medications   Medication Sig Start Date End Date Taking? Authorizing Provider  aspirin 325 MG tablet Take 325 mg by mouth daily.    [provider]  brompheniramine-pseudoephedrine-DM 30-2-10 MG/5ML syrup Take 5 mLs by mouth 4 (four) times daily as needed. 07/24/17   Joni Reining, PA-C  furosemide (LASIX) 20 MG tablet Take 1 tablet (20 mg total) by mouth 2 (two) times daily. 05/18/15   Delma Freeze, FNP  hydrALAZINE (APRESOLINE) 100 MG tablet Take 1 tablet (100 mg total) by mouth every 8 (eight) hours. 05/19/15   Delma Freeze, FNP  lisinopril (PRINIVIL,ZESTRIL) 40 MG tablet Take 1 tablet (40 mg total) by mouth daily. 04/03/15   Auburn Bilberry, MD  metoprolol (LOPRESSOR) 100 MG tablet Take 1 tablet  (100 mg total) by mouth 2 (two) times daily. 04/03/15   Auburn Bilberry, MD  Multiple Vitamin (MULTIVITAMIN WITH MINERALS) TABS tablet Take 1 tablet by mouth daily.    [provider]  simvastatin (ZOCOR) 20 MG tablet Take 1 tablet (20 mg total) by mouth daily. 04/03/15   Auburn Bilberry, MD  vitamin B-12 (CYANOCOBALAMIN) 1000 MCG tablet Take 1,000 mcg by mouth daily.    [provider]    Allergies Lactose intolerance (gi)  No family history on file.  Social History Social History   Tobacco Use  . Smoking status: Current Some Day Smoker    Packs/day: 1.00    Types: Cigarettes    Last attempt to quit: 03/20/2015    Years since quitting: 2.3  . Smokeless tobacco: Never Used  Substance Use Topics  . Alcohol use: Yes    Alcohol/week: 21.6 oz    Types: 36 Cans of beer per week    Comment: 18 cans of beer on Friday and again on Saturday nights  . Drug use: No    Review of Systems Constitutional: No fever/chills Eyes: No visual changes. ENT: No sore throat. Cardiovascular: Denies chest pain.  History of CHF. Respiratory: Denies shortness of breath.  Productive cough. Gastrointestinal: No abdominal pain.  No nausea, no vomiting.  No diarrhea.  No constipation. Genitourinary: Negative for dysuria. Musculoskeletal: Negative for back pain. Skin: Negative for rash. Neurological: Negative for headaches, focal weakness or numbness. Endocrine:Hypertension. Allergic/Immunilogical: Lactose. ____________________________________________   PHYSICAL EXAM:  VITAL SIGNS: ED Triage Vitals  Enc Vitals Group     BP 07/24/17 1103 (!) 173/129     Pulse Rate 07/24/17 1101 94     Resp 07/24/17 1101 18     Temp 07/24/17 1101 98.1 F (36.7 C)     Temp Source 07/24/17 1101 Oral     SpO2 07/24/17 1101 98 %     Weight 07/24/17 1102 (!) 350 lb (158.8 kg)     Height 07/24/17 1102  (1.905 m)     Head Circumference --      Peak Flow --      Pain Score --      Pain Loc --        Pain Edu? --      Excl. in GC? --     Constitutional: Alert and oriented. Well appearing and in no acute distress. Neck: No stridor. Hematological/Lymphatic/Immunilogical: No cervical lymphadenopathy. Cardiovascular: Normal rate, regular rhythm. Grossly normal heart sounds.  Good peripheral circulation. Respiratory: Normal respiratory effort.  No retractions. Lungs CTAB. usculoskeletal: No lower extremity tenderness nor edema.  No joint effusions. Neurologic:  Normal speech and language. No gross focal neurologic deficits are appreciated. No gait instability. Skin:  Skin is warm, dry and intact. No rash noted. Psychiatric: Mood and affect are normal. Speech and behavior are normal.  ____________________________________________   LABS (all labs ordered are listed, but only abnormal results are displayed)  Labs Reviewed - No data to display ____________________________________________  EKG   ____________________________________________  RADIOLOGY  Cardiomegaly with signs of congestive heart failure.  Official radiology report(s): Dg Chest 2 View  Result Date: 07/24/2017 CLINICAL DATA:  Productive cough for 2 weeks.  Chills.  Vomiting. EXAM: CHEST - 2 VIEW COMPARISON:  03/31/2015 FINDINGS: Stable mild to moderate cardiomegaly and diffuse pulmonary vascular congestion. No evidence of pulmonary infiltrate or pleural effusion. IMPRESSION: Stable cardiomegaly and pulmonary vascular congestion. No acute findings. Electronically Signed   By: Myles Rosenthal M.D.   On: 07/24/2017 12:33    ____________________________________________   PROCEDURES  Procedure(s) performed: None  Procedures  Critical Care performed: No  ____________________________________________   INITIAL IMPRESSION / ASSESSMENT AND PLAN / ED COURSE  As part of my medical decision making, I reviewed the following data within the electronic MEDICAL RECORD NUMBER    Viral illness and cough compounded by  congestive heart failure.  Discussed x-ray findings with patient.  Advised to decrease/discontinue excessive beer and fluid intake.  Take medication as directed and follow-up PCP.      ____________________________________________   FINAL CLINICAL IMPRESSION(S) / ED DIAGNOSES  Final diagnoses:  Viral URI with cough  Chronic systolic congestive heart failure [facility])     ED Discharge Orders        Ordered    brompheniramine-pseudoephedrine-DM 30-2-10 MG/5ML syrup  4 times daily PRN     07/24/17 1327       Note:  This document was prepared using Dragon voice recognition software and may include unintentional dictation errors.    Joni Reining, PA-C 07/24/17 1329    Don Perking, [place], MD 07/24/17 [phone]

## 2017-07-24 NOTE — Discharge Instructions (Signed)
Discussed your chronic medical condition predispose you to cough and congestion.  Advised to decrease EtOH intake consisting of beers and other fluids.  Follow-up with PCP.

## 2017-07-24 NOTE — ED Triage Notes (Signed)
Says cough for several days.  Now coughing up green sputum.  Says also having chills.  Vomited x 1 yesterday.

## 2017-08-23 DIAGNOSIS — I428 Other cardiomyopathies: Secondary | ICD-10-CM | POA: Insufficient documentation

## 2017-09-28 IMAGING — CR DG CHEST 2V
1 series · 2 of 2 positions shown · non-contrast
Comparison: None.

CLINICAL DATA: Cough.  Short of breath.  History of hypertension.

EXAM:
CHEST  2 VIEW

[Series 1: dg chest 2 view · 0.14mm/px · 2 of 2 slices shown]
[im 1/2]
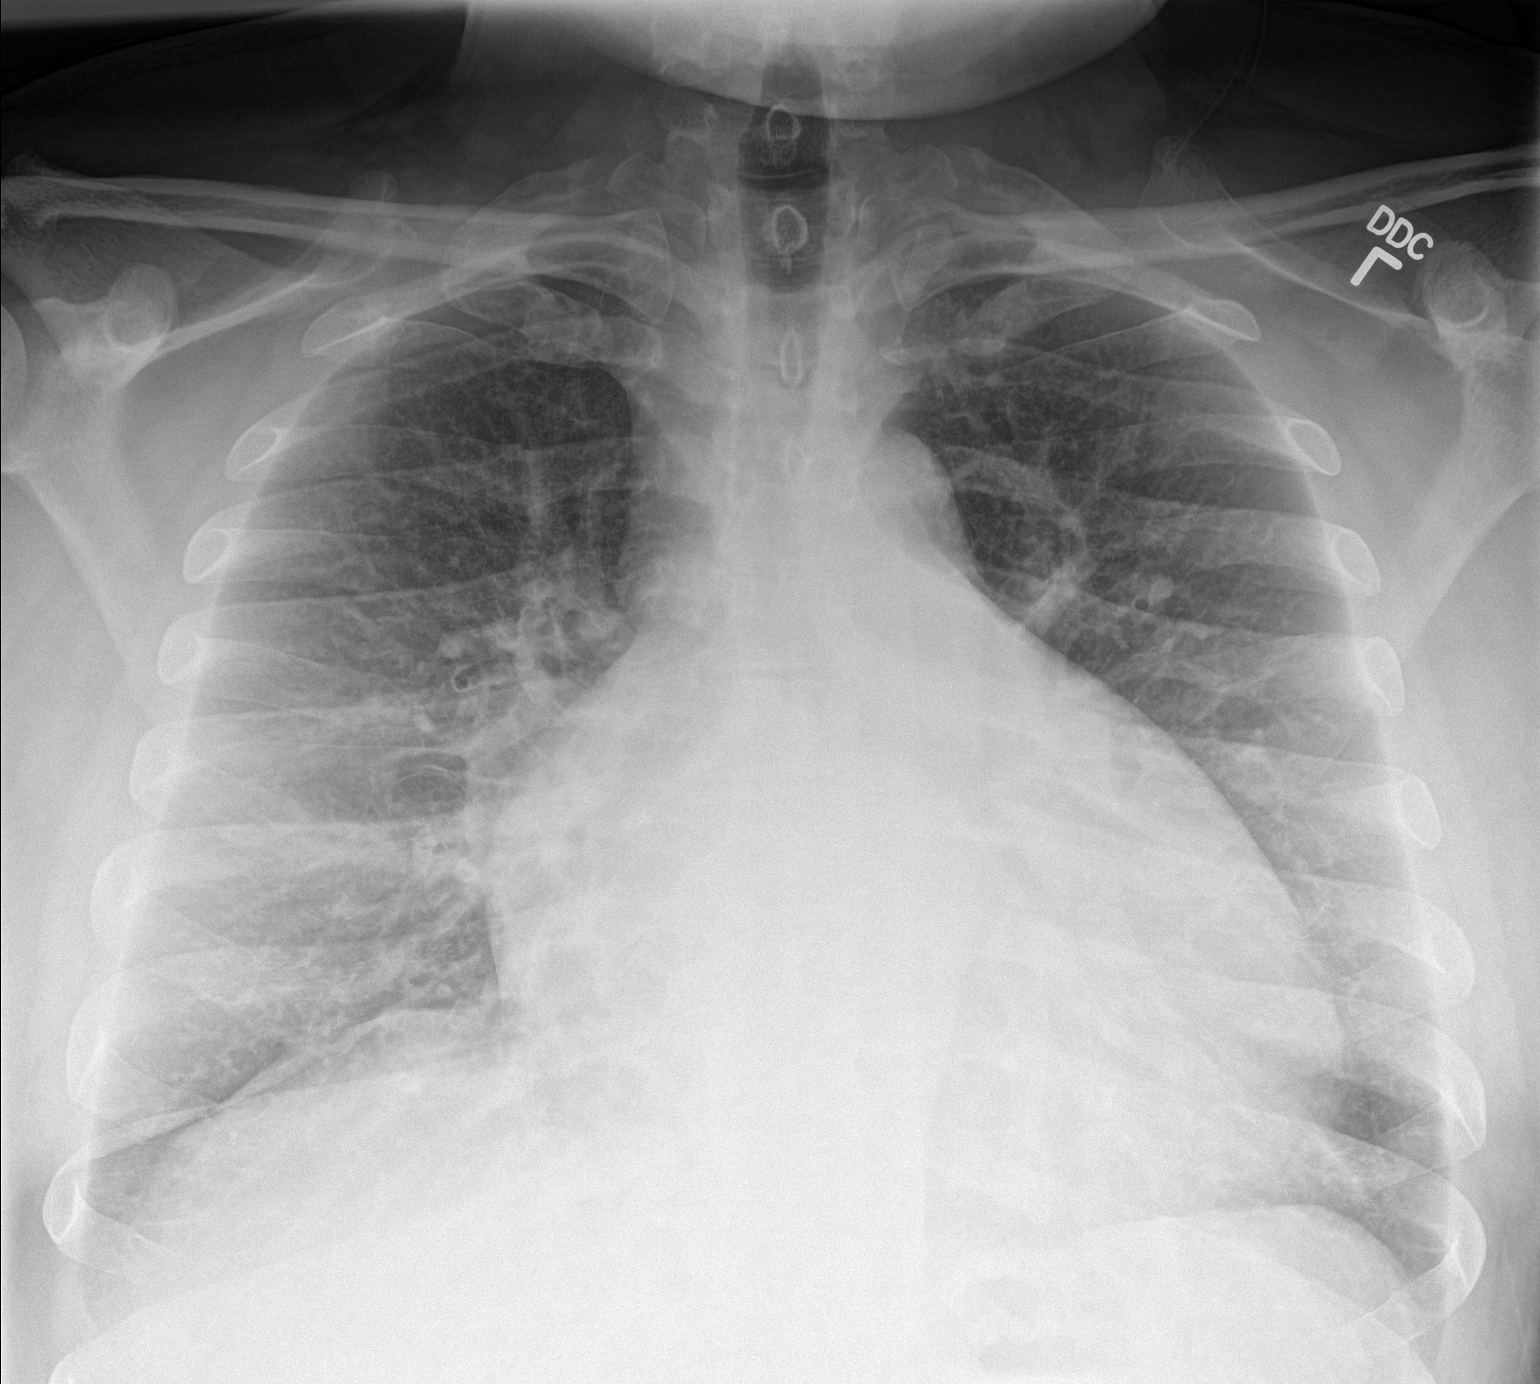
[im 2/2]
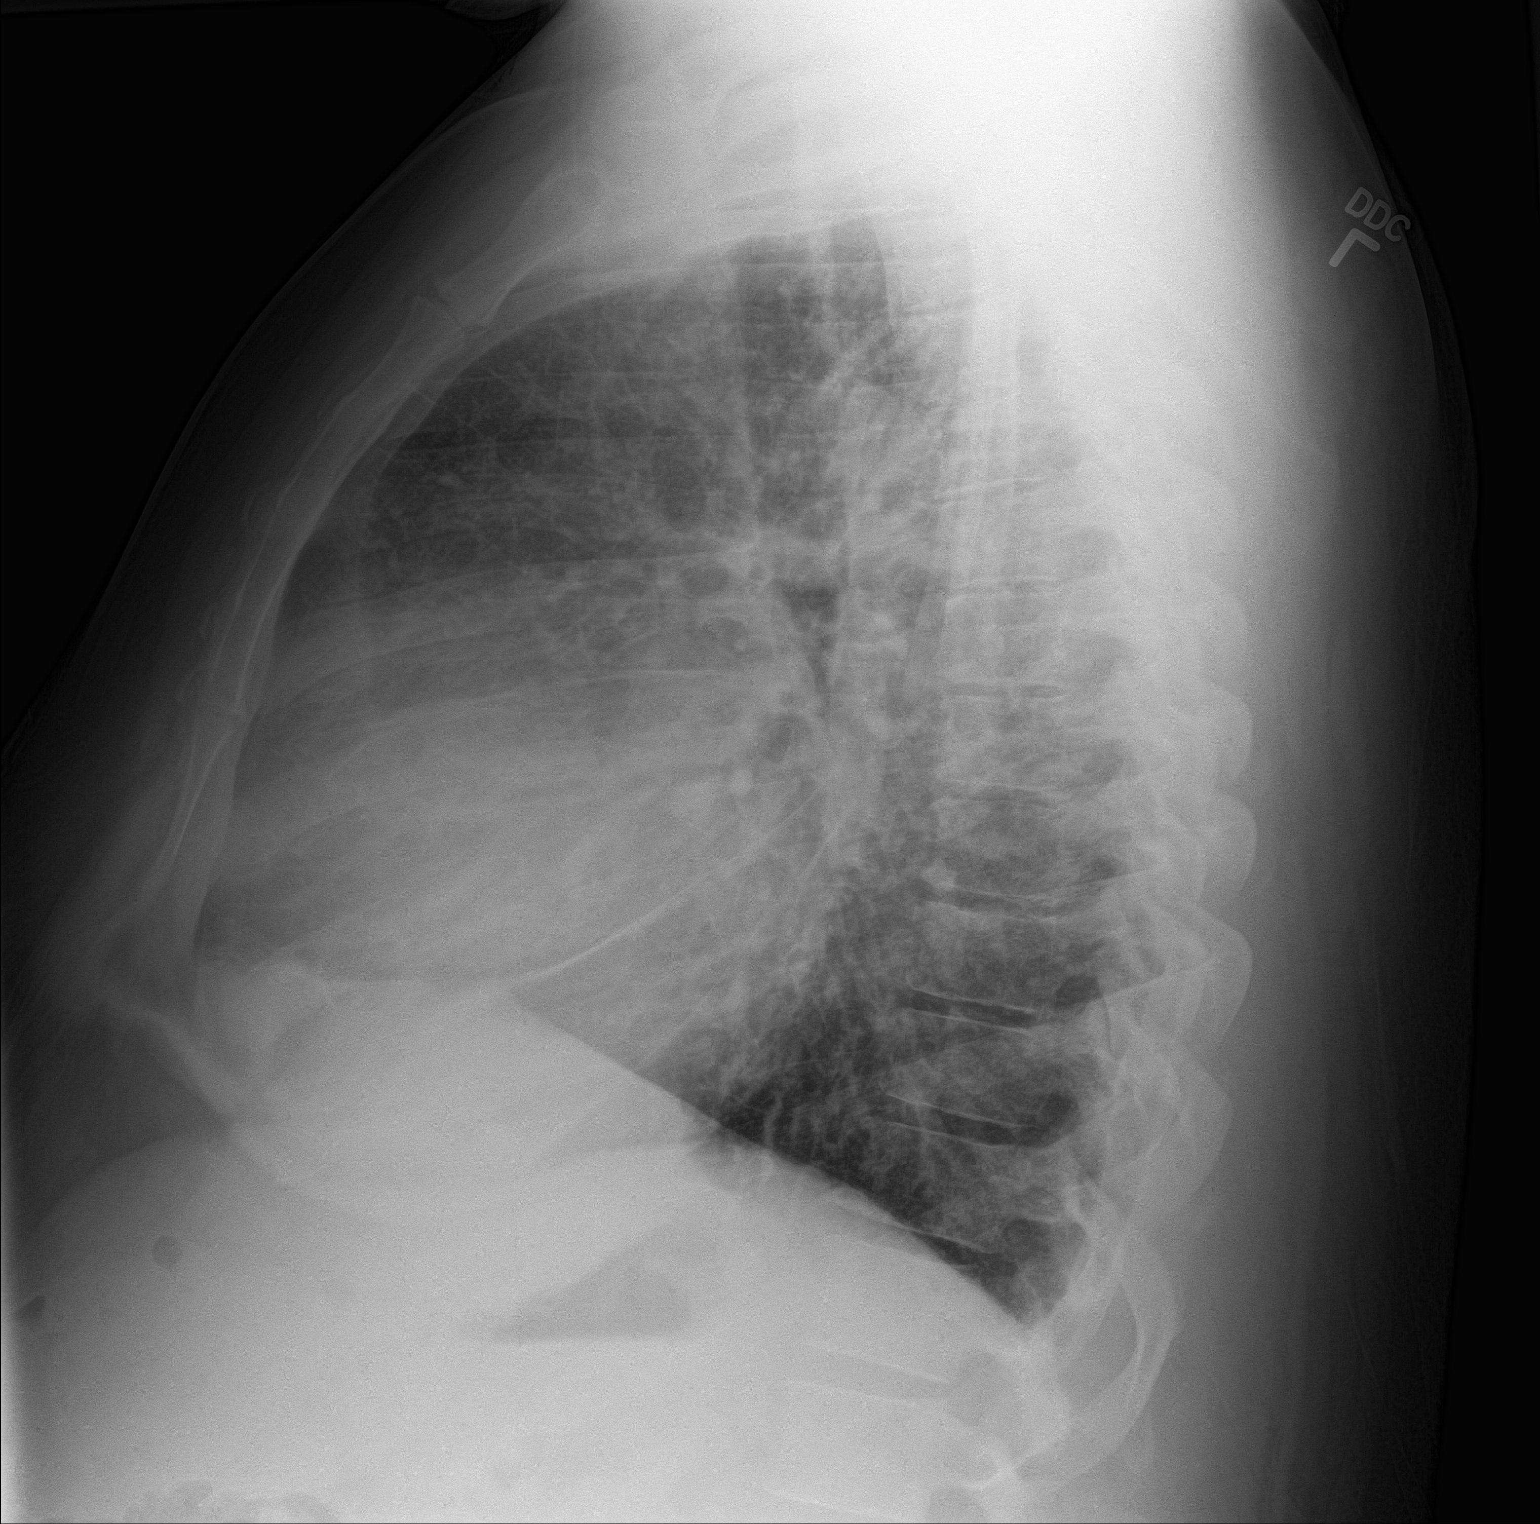

[2 of 2 positions shown; findings below may reference images not displayed]

FINDINGS: Cardiac silhouette is mildly enlarged. No mediastinal or hilar
masses or convincing adenopathy.

There is bilateral interstitial thickening. There is no focal lung
consolidation to suggest pneumonia. No pleural effusion or
pneumothorax.

Bony thorax is unremarkable.
IMPRESSION: 1. Cardiomegaly with interstitial thickening consistent with mild
congestive heart failure.

## 2018-09-10 DIAGNOSIS — I2510 Atherosclerotic heart disease of native coronary artery without angina pectoris: Secondary | ICD-10-CM | POA: Insufficient documentation

## 2020-01-22 IMAGING — CR DG CHEST 2V
1 series · 2 of 2 positions shown · non-contrast
Comparison: 03/31/2015

CLINICAL DATA: Productive cough for 2 weeks.  Chills.  Vomiting.

EXAM:
CHEST - 2 VIEW

[Series 1: dg chest 2 view · 0.14mm/px · 2 of 2 slices shown]
[im 1/2]
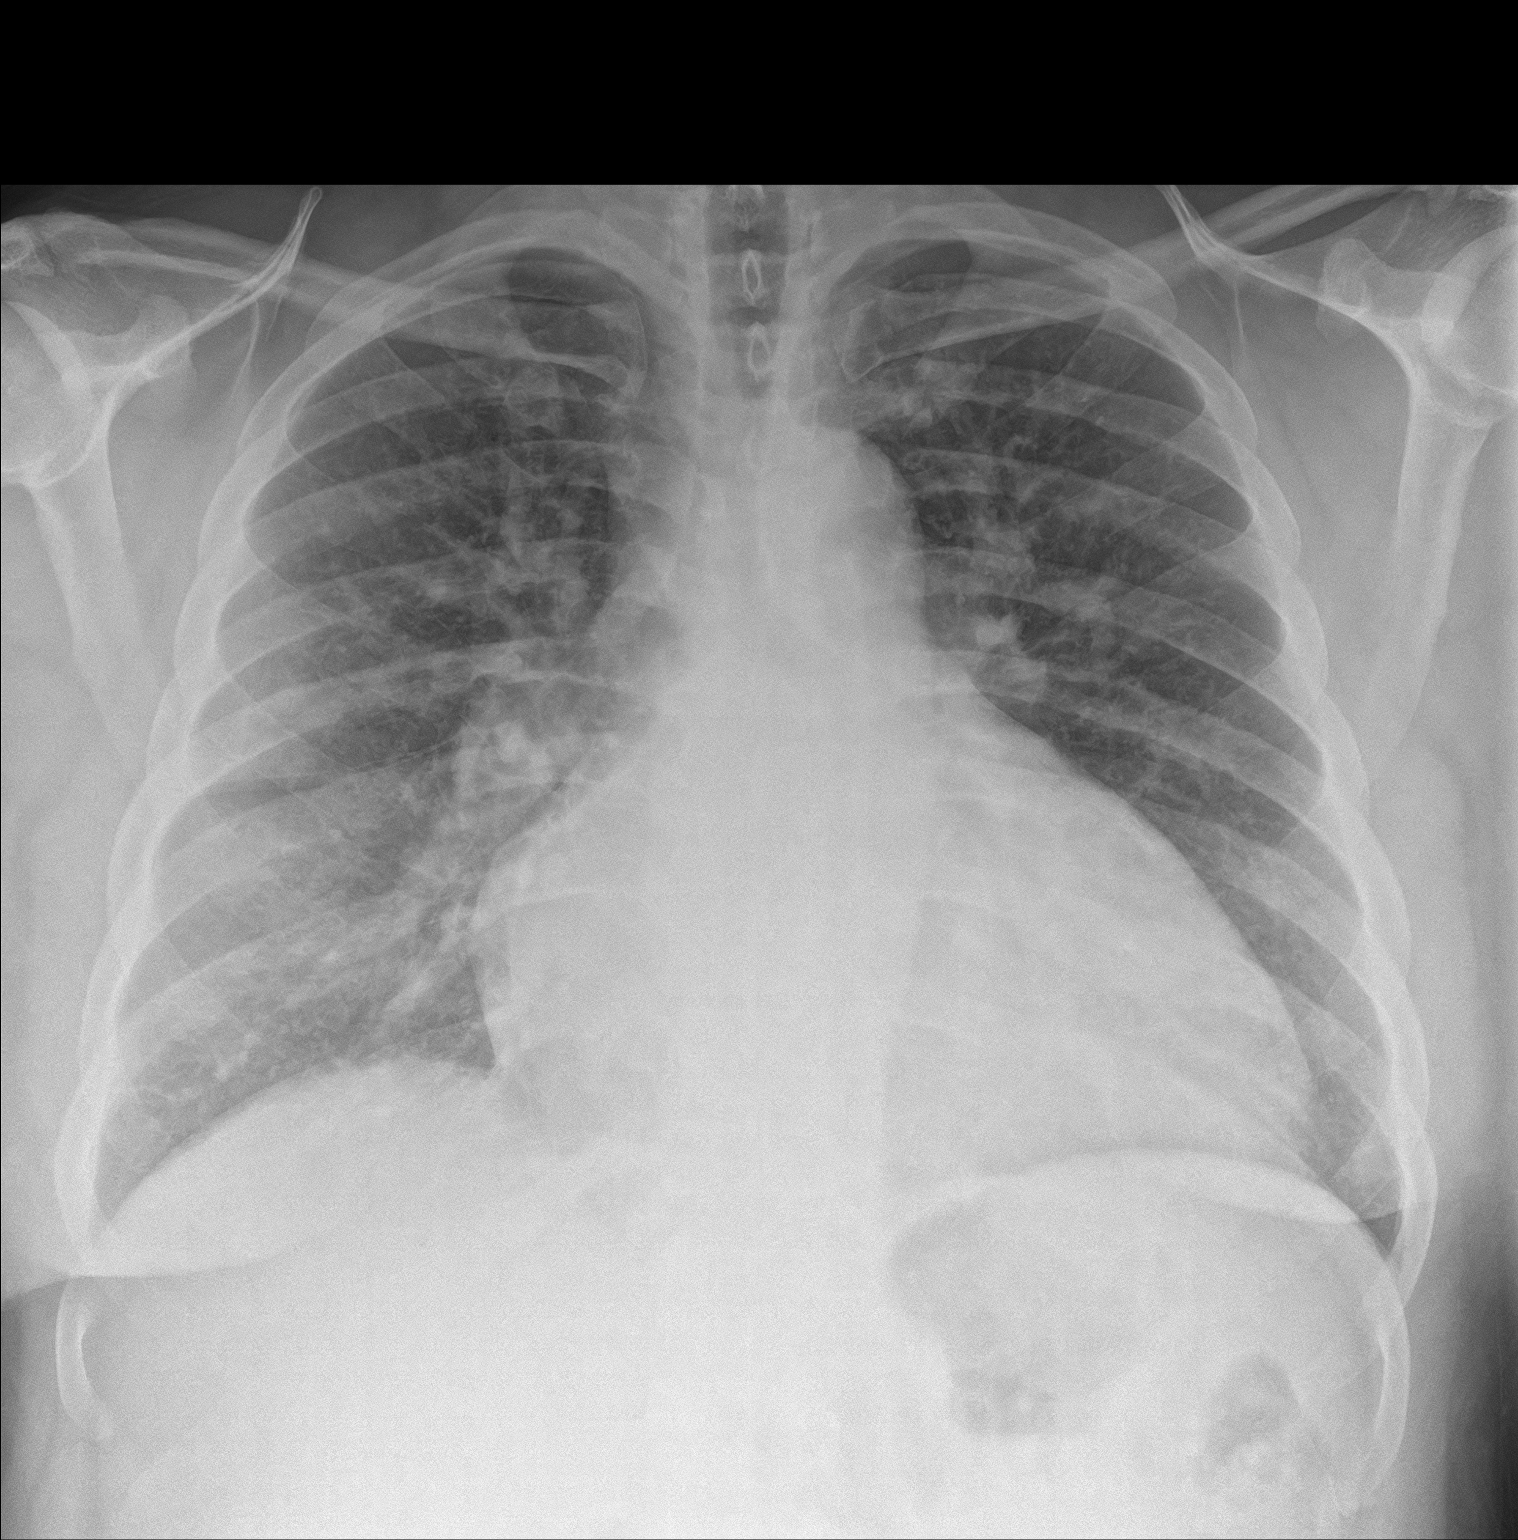
[im 2/2]
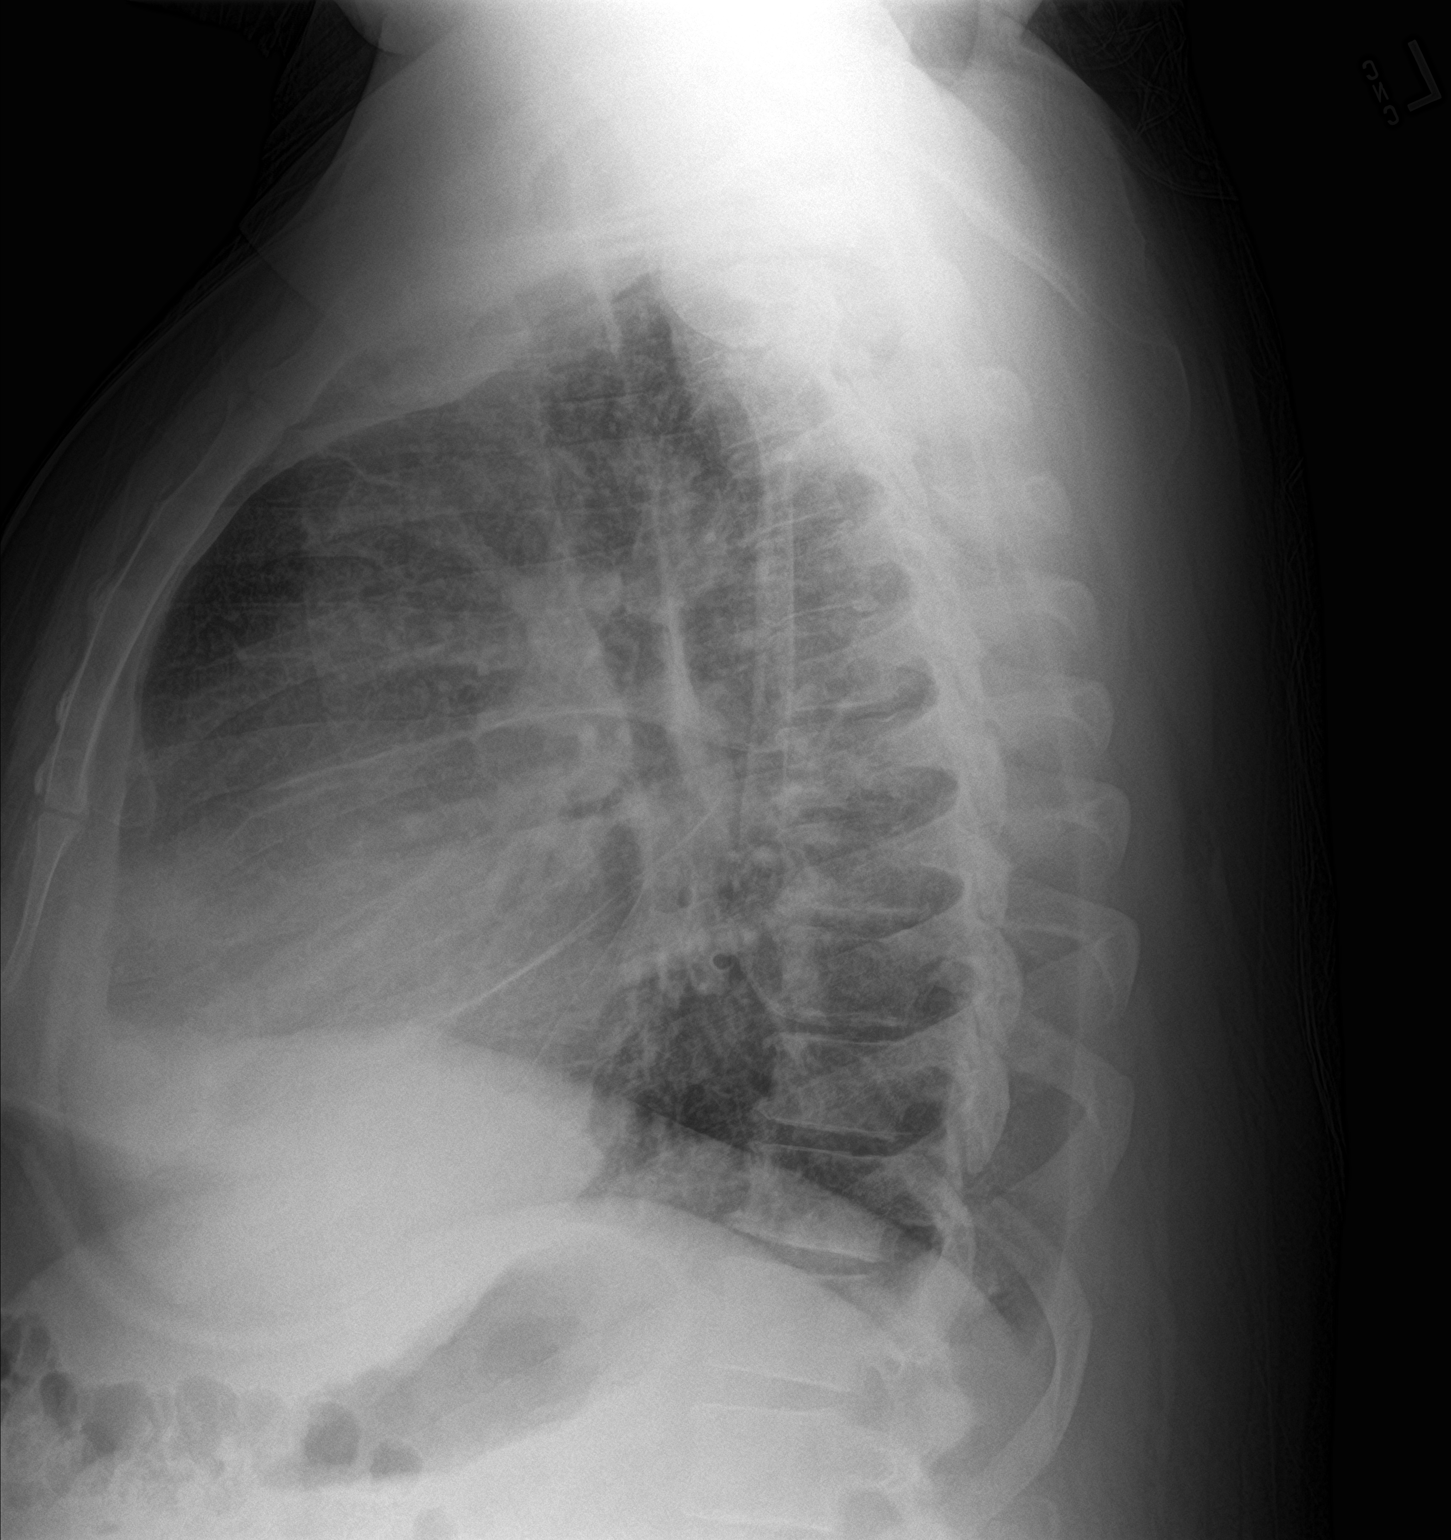

[2 of 2 positions shown; findings below may reference images not displayed]

FINDINGS: Stable mild to moderate cardiomegaly and diffuse pulmonary vascular
congestion. No evidence of pulmonary infiltrate or pleural effusion.
IMPRESSION: Stable cardiomegaly and pulmonary vascular congestion. No acute
findings.

## 2022-07-18 DIAGNOSIS — G4733 Obstructive sleep apnea (adult) (pediatric): Secondary | ICD-10-CM | POA: Diagnosis not present

## 2022-07-18 DIAGNOSIS — N183 Chronic kidney disease, stage 3 unspecified: Secondary | ICD-10-CM | POA: Diagnosis not present

## 2022-07-18 DIAGNOSIS — I428 Other cardiomyopathies: Secondary | ICD-10-CM | POA: Diagnosis not present

## 2022-07-18 DIAGNOSIS — I5020 Unspecified systolic (congestive) heart failure: Secondary | ICD-10-CM | POA: Diagnosis not present

## 2022-07-18 DIAGNOSIS — I340 Nonrheumatic mitral (valve) insufficiency: Secondary | ICD-10-CM | POA: Diagnosis not present

## 2022-07-18 DIAGNOSIS — J811 Chronic pulmonary edema: Secondary | ICD-10-CM | POA: Diagnosis not present

## 2022-07-18 DIAGNOSIS — E785 Hyperlipidemia, unspecified: Secondary | ICD-10-CM | POA: Diagnosis not present

## 2022-07-18 DIAGNOSIS — I5023 Acute on chronic systolic (congestive) heart failure: Secondary | ICD-10-CM | POA: Diagnosis not present

## 2022-07-18 DIAGNOSIS — M25572 Pain in left ankle and joints of left foot: Secondary | ICD-10-CM | POA: Diagnosis not present

## 2022-07-18 DIAGNOSIS — R001 Bradycardia, unspecified: Secondary | ICD-10-CM | POA: Diagnosis not present

## 2022-07-18 DIAGNOSIS — F17210 Nicotine dependence, cigarettes, uncomplicated: Secondary | ICD-10-CM | POA: Diagnosis not present

## 2022-07-18 DIAGNOSIS — M10072 Idiopathic gout, left ankle and foot: Secondary | ICD-10-CM | POA: Diagnosis not present

## 2022-07-18 DIAGNOSIS — Z7982 Long term (current) use of aspirin: Secondary | ICD-10-CM | POA: Diagnosis not present

## 2022-07-18 DIAGNOSIS — I5089 Other heart failure: Secondary | ICD-10-CM | POA: Diagnosis not present

## 2022-07-18 DIAGNOSIS — D631 Anemia in chronic kidney disease: Secondary | ICD-10-CM | POA: Diagnosis not present

## 2022-07-18 DIAGNOSIS — R0602 Shortness of breath: Secondary | ICD-10-CM | POA: Diagnosis not present

## 2022-07-18 DIAGNOSIS — I2510 Atherosclerotic heart disease of native coronary artery without angina pectoris: Secondary | ICD-10-CM | POA: Diagnosis not present

## 2022-07-18 DIAGNOSIS — I517 Cardiomegaly: Secondary | ICD-10-CM | POA: Diagnosis not present

## 2022-07-18 DIAGNOSIS — R079 Chest pain, unspecified: Secondary | ICD-10-CM | POA: Diagnosis not present

## 2022-07-18 DIAGNOSIS — Z79899 Other long term (current) drug therapy: Secondary | ICD-10-CM | POA: Diagnosis not present

## 2022-07-18 DIAGNOSIS — I509 Heart failure, unspecified: Secondary | ICD-10-CM | POA: Diagnosis not present

## 2022-07-18 DIAGNOSIS — M109 Gout, unspecified: Secondary | ICD-10-CM | POA: Diagnosis not present

## 2022-07-18 DIAGNOSIS — I429 Cardiomyopathy, unspecified: Secondary | ICD-10-CM | POA: Diagnosis not present

## 2022-07-18 DIAGNOSIS — I272 Pulmonary hypertension, unspecified: Secondary | ICD-10-CM | POA: Diagnosis not present

## 2022-07-18 DIAGNOSIS — I130 Hypertensive heart and chronic kidney disease with heart failure and stage 1 through stage 4 chronic kidney disease, or unspecified chronic kidney disease: Secondary | ICD-10-CM | POA: Diagnosis not present

## 2022-07-18 DIAGNOSIS — R918 Other nonspecific abnormal finding of lung field: Secondary | ICD-10-CM | POA: Diagnosis not present

## 2022-07-19 DIAGNOSIS — F17200 Nicotine dependence, unspecified, uncomplicated: Secondary | ICD-10-CM | POA: Diagnosis present

## 2022-07-22 DIAGNOSIS — I5020 Unspecified systolic (congestive) heart failure: Secondary | ICD-10-CM | POA: Diagnosis not present

## 2022-07-22 DIAGNOSIS — G4733 Obstructive sleep apnea (adult) (pediatric): Secondary | ICD-10-CM | POA: Diagnosis not present

## 2022-07-22 DIAGNOSIS — I272 Pulmonary hypertension, unspecified: Secondary | ICD-10-CM | POA: Diagnosis not present

## 2022-07-22 DIAGNOSIS — Z792 Long term (current) use of antibiotics: Secondary | ICD-10-CM | POA: Diagnosis not present

## 2022-07-22 DIAGNOSIS — I110 Hypertensive heart disease with heart failure: Secondary | ICD-10-CM | POA: Diagnosis not present

## 2022-07-22 DIAGNOSIS — I129 Hypertensive chronic kidney disease with stage 1 through stage 4 chronic kidney disease, or unspecified chronic kidney disease: Secondary | ICD-10-CM | POA: Diagnosis not present

## 2022-07-22 DIAGNOSIS — I428 Other cardiomyopathies: Secondary | ICD-10-CM | POA: Diagnosis not present

## 2022-07-22 DIAGNOSIS — I429 Cardiomyopathy, unspecified: Secondary | ICD-10-CM | POA: Diagnosis not present

## 2022-07-22 DIAGNOSIS — Z79899 Other long term (current) drug therapy: Secondary | ICD-10-CM | POA: Diagnosis not present

## 2022-07-22 DIAGNOSIS — D649 Anemia, unspecified: Secondary | ICD-10-CM | POA: Diagnosis not present

## 2022-07-22 DIAGNOSIS — I2510 Atherosclerotic heart disease of native coronary artery without angina pectoris: Secondary | ICD-10-CM | POA: Diagnosis not present

## 2022-07-22 DIAGNOSIS — F17210 Nicotine dependence, cigarettes, uncomplicated: Secondary | ICD-10-CM | POA: Diagnosis not present

## 2022-07-22 DIAGNOSIS — M109 Gout, unspecified: Secondary | ICD-10-CM | POA: Diagnosis not present

## 2022-07-22 DIAGNOSIS — I5023 Acute on chronic systolic (congestive) heart failure: Secondary | ICD-10-CM | POA: Diagnosis not present

## 2022-07-22 DIAGNOSIS — E785 Hyperlipidemia, unspecified: Secondary | ICD-10-CM | POA: Diagnosis not present

## 2022-07-22 DIAGNOSIS — N183 Chronic kidney disease, stage 3 unspecified: Secondary | ICD-10-CM | POA: Diagnosis not present

## 2022-07-28 DIAGNOSIS — I10 Essential (primary) hypertension: Secondary | ICD-10-CM | POA: Diagnosis not present

## 2022-07-28 DIAGNOSIS — I5022 Chronic systolic (congestive) heart failure: Secondary | ICD-10-CM | POA: Diagnosis not present

## 2022-07-28 DIAGNOSIS — I428 Other cardiomyopathies: Secondary | ICD-10-CM | POA: Diagnosis not present

## 2022-08-21 DIAGNOSIS — I2510 Atherosclerotic heart disease of native coronary artery without angina pectoris: Secondary | ICD-10-CM | POA: Diagnosis not present

## 2022-08-21 DIAGNOSIS — N1832 Chronic kidney disease, stage 3b: Secondary | ICD-10-CM | POA: Diagnosis not present

## 2022-08-21 DIAGNOSIS — F17210 Nicotine dependence, cigarettes, uncomplicated: Secondary | ICD-10-CM | POA: Diagnosis not present

## 2022-08-21 DIAGNOSIS — R0602 Shortness of breath: Secondary | ICD-10-CM | POA: Diagnosis not present

## 2022-08-21 DIAGNOSIS — E785 Hyperlipidemia, unspecified: Secondary | ICD-10-CM | POA: Diagnosis not present

## 2022-08-21 DIAGNOSIS — I130 Hypertensive heart and chronic kidney disease with heart failure and stage 1 through stage 4 chronic kidney disease, or unspecified chronic kidney disease: Secondary | ICD-10-CM | POA: Diagnosis not present

## 2022-08-21 DIAGNOSIS — R079 Chest pain, unspecified: Secondary | ICD-10-CM | POA: Diagnosis not present

## 2022-08-21 DIAGNOSIS — I509 Heart failure, unspecified: Secondary | ICD-10-CM | POA: Diagnosis not present

## 2022-08-21 DIAGNOSIS — J811 Chronic pulmonary edema: Secondary | ICD-10-CM | POA: Diagnosis not present

## 2022-08-22 DIAGNOSIS — F17200 Nicotine dependence, unspecified, uncomplicated: Secondary | ICD-10-CM | POA: Diagnosis not present

## 2022-08-22 DIAGNOSIS — I429 Cardiomyopathy, unspecified: Secondary | ICD-10-CM | POA: Diagnosis not present

## 2022-08-22 DIAGNOSIS — E785 Hyperlipidemia, unspecified: Secondary | ICD-10-CM | POA: Diagnosis not present

## 2022-08-22 DIAGNOSIS — R0602 Shortness of breath: Secondary | ICD-10-CM | POA: Diagnosis not present

## 2022-08-22 DIAGNOSIS — I2510 Atherosclerotic heart disease of native coronary artery without angina pectoris: Secondary | ICD-10-CM | POA: Diagnosis not present

## 2022-08-22 DIAGNOSIS — I5020 Unspecified systolic (congestive) heart failure: Secondary | ICD-10-CM | POA: Diagnosis not present

## 2022-08-22 DIAGNOSIS — I119 Hypertensive heart disease without heart failure: Secondary | ICD-10-CM | POA: Diagnosis not present

## 2022-08-22 DIAGNOSIS — F17210 Nicotine dependence, cigarettes, uncomplicated: Secondary | ICD-10-CM | POA: Diagnosis not present

## 2022-08-22 DIAGNOSIS — I25119 Atherosclerotic heart disease of native coronary artery with unspecified angina pectoris: Secondary | ICD-10-CM | POA: Diagnosis not present

## 2022-08-22 DIAGNOSIS — N1831 Chronic kidney disease, stage 3a: Secondary | ICD-10-CM | POA: Diagnosis not present

## 2022-08-22 DIAGNOSIS — I428 Other cardiomyopathies: Secondary | ICD-10-CM | POA: Diagnosis not present

## 2022-08-22 DIAGNOSIS — I110 Hypertensive heart disease with heart failure: Secondary | ICD-10-CM | POA: Diagnosis not present

## 2022-08-22 DIAGNOSIS — I5023 Acute on chronic systolic (congestive) heart failure: Secondary | ICD-10-CM | POA: Diagnosis not present

## 2022-08-23 DIAGNOSIS — Z6841 Body Mass Index (BMI) 40.0 and over, adult: Secondary | ICD-10-CM | POA: Diagnosis not present

## 2022-08-23 DIAGNOSIS — I119 Hypertensive heart disease without heart failure: Secondary | ICD-10-CM | POA: Diagnosis not present

## 2022-08-23 DIAGNOSIS — F17210 Nicotine dependence, cigarettes, uncomplicated: Secondary | ICD-10-CM | POA: Diagnosis not present

## 2022-08-23 DIAGNOSIS — Z1152 Encounter for screening for COVID-19: Secondary | ICD-10-CM | POA: Diagnosis not present

## 2022-08-23 DIAGNOSIS — I081 Rheumatic disorders of both mitral and tricuspid valves: Secondary | ICD-10-CM | POA: Diagnosis not present

## 2022-08-23 DIAGNOSIS — I429 Cardiomyopathy, unspecified: Secondary | ICD-10-CM | POA: Diagnosis not present

## 2022-08-23 DIAGNOSIS — F17200 Nicotine dependence, unspecified, uncomplicated: Secondary | ICD-10-CM | POA: Diagnosis not present

## 2022-08-23 DIAGNOSIS — R0602 Shortness of breath: Secondary | ICD-10-CM | POA: Diagnosis not present

## 2022-08-23 DIAGNOSIS — E785 Hyperlipidemia, unspecified: Secondary | ICD-10-CM | POA: Diagnosis not present

## 2022-08-23 DIAGNOSIS — I5023 Acute on chronic systolic (congestive) heart failure: Secondary | ICD-10-CM | POA: Diagnosis not present

## 2022-08-23 DIAGNOSIS — N1832 Chronic kidney disease, stage 3b: Secondary | ICD-10-CM | POA: Diagnosis not present

## 2022-08-23 DIAGNOSIS — I428 Other cardiomyopathies: Secondary | ICD-10-CM | POA: Diagnosis not present

## 2022-08-23 DIAGNOSIS — I493 Ventricular premature depolarization: Secondary | ICD-10-CM | POA: Diagnosis not present

## 2022-08-23 DIAGNOSIS — I509 Heart failure, unspecified: Secondary | ICD-10-CM | POA: Diagnosis not present

## 2022-08-23 DIAGNOSIS — I130 Hypertensive heart and chronic kidney disease with heart failure and stage 1 through stage 4 chronic kidney disease, or unspecified chronic kidney disease: Secondary | ICD-10-CM | POA: Diagnosis not present

## 2022-08-23 DIAGNOSIS — Z95810 Presence of automatic (implantable) cardiac defibrillator: Secondary | ICD-10-CM | POA: Diagnosis not present

## 2022-08-23 DIAGNOSIS — M791 Myalgia, unspecified site: Secondary | ICD-10-CM | POA: Diagnosis not present

## 2022-08-23 DIAGNOSIS — Z7901 Long term (current) use of anticoagulants: Secondary | ICD-10-CM | POA: Diagnosis not present

## 2022-08-23 DIAGNOSIS — N1831 Chronic kidney disease, stage 3a: Secondary | ICD-10-CM | POA: Diagnosis not present

## 2022-08-23 DIAGNOSIS — Z7982 Long term (current) use of aspirin: Secondary | ICD-10-CM | POA: Diagnosis not present

## 2022-08-23 DIAGNOSIS — Z955 Presence of coronary angioplasty implant and graft: Secondary | ICD-10-CM | POA: Diagnosis not present

## 2022-08-23 DIAGNOSIS — I5020 Unspecified systolic (congestive) heart failure: Secondary | ICD-10-CM | POA: Diagnosis not present

## 2022-08-23 DIAGNOSIS — I2510 Atherosclerotic heart disease of native coronary artery without angina pectoris: Secondary | ICD-10-CM | POA: Diagnosis not present

## 2022-08-23 DIAGNOSIS — I110 Hypertensive heart disease with heart failure: Secondary | ICD-10-CM | POA: Diagnosis not present

## 2022-08-23 DIAGNOSIS — Z716 Tobacco abuse counseling: Secondary | ICD-10-CM | POA: Diagnosis not present

## 2022-08-25 DIAGNOSIS — I429 Cardiomyopathy, unspecified: Secondary | ICD-10-CM | POA: Diagnosis not present

## 2022-08-25 DIAGNOSIS — I2510 Atherosclerotic heart disease of native coronary artery without angina pectoris: Secondary | ICD-10-CM | POA: Diagnosis not present

## 2022-08-25 DIAGNOSIS — I428 Other cardiomyopathies: Secondary | ICD-10-CM | POA: Diagnosis not present

## 2022-08-25 DIAGNOSIS — I110 Hypertensive heart disease with heart failure: Secondary | ICD-10-CM | POA: Diagnosis not present

## 2022-08-25 DIAGNOSIS — N1831 Chronic kidney disease, stage 3a: Secondary | ICD-10-CM | POA: Diagnosis not present

## 2022-08-25 DIAGNOSIS — I5023 Acute on chronic systolic (congestive) heart failure: Secondary | ICD-10-CM | POA: Diagnosis not present

## 2022-08-25 DIAGNOSIS — I119 Hypertensive heart disease without heart failure: Secondary | ICD-10-CM | POA: Diagnosis not present

## 2022-08-25 DIAGNOSIS — E785 Hyperlipidemia, unspecified: Secondary | ICD-10-CM | POA: Diagnosis not present

## 2022-08-25 DIAGNOSIS — F17200 Nicotine dependence, unspecified, uncomplicated: Secondary | ICD-10-CM | POA: Diagnosis not present

## 2022-09-14 DIAGNOSIS — I5089 Other heart failure: Secondary | ICD-10-CM | POA: Diagnosis not present

## 2022-09-22 DIAGNOSIS — I428 Other cardiomyopathies: Secondary | ICD-10-CM | POA: Diagnosis not present

## 2022-09-22 DIAGNOSIS — I5022 Chronic systolic (congestive) heart failure: Secondary | ICD-10-CM | POA: Diagnosis not present

## 2022-10-10 DIAGNOSIS — I2510 Atherosclerotic heart disease of native coronary artery without angina pectoris: Secondary | ICD-10-CM | POA: Diagnosis not present

## 2022-10-10 DIAGNOSIS — I5022 Chronic systolic (congestive) heart failure: Secondary | ICD-10-CM | POA: Diagnosis not present

## 2022-10-10 DIAGNOSIS — I454 Nonspecific intraventricular block: Secondary | ICD-10-CM | POA: Diagnosis not present

## 2022-12-16 DIAGNOSIS — Z8249 Family history of ischemic heart disease and other diseases of the circulatory system: Secondary | ICD-10-CM | POA: Diagnosis not present

## 2022-12-16 DIAGNOSIS — G4733 Obstructive sleep apnea (adult) (pediatric): Secondary | ICD-10-CM | POA: Diagnosis not present

## 2022-12-16 DIAGNOSIS — I429 Cardiomyopathy, unspecified: Secondary | ICD-10-CM | POA: Diagnosis not present

## 2022-12-16 DIAGNOSIS — F32A Depression, unspecified: Secondary | ICD-10-CM | POA: Diagnosis not present

## 2022-12-16 DIAGNOSIS — Z7901 Long term (current) use of anticoagulants: Secondary | ICD-10-CM | POA: Diagnosis not present

## 2022-12-16 DIAGNOSIS — J45909 Unspecified asthma, uncomplicated: Secondary | ICD-10-CM | POA: Diagnosis not present

## 2022-12-16 DIAGNOSIS — I110 Hypertensive heart disease with heart failure: Secondary | ICD-10-CM | POA: Diagnosis not present

## 2022-12-16 DIAGNOSIS — E785 Hyperlipidemia, unspecified: Secondary | ICD-10-CM | POA: Diagnosis not present

## 2022-12-16 DIAGNOSIS — F17210 Nicotine dependence, cigarettes, uncomplicated: Secondary | ICD-10-CM | POA: Diagnosis not present

## 2022-12-16 DIAGNOSIS — Z86718 Personal history of other venous thrombosis and embolism: Secondary | ICD-10-CM | POA: Diagnosis not present

## 2022-12-16 DIAGNOSIS — G629 Polyneuropathy, unspecified: Secondary | ICD-10-CM | POA: Diagnosis not present

## 2022-12-16 DIAGNOSIS — N529 Male erectile dysfunction, unspecified: Secondary | ICD-10-CM | POA: Diagnosis not present

## 2023-03-18 DIAGNOSIS — Z008 Encounter for other general examination: Secondary | ICD-10-CM | POA: Diagnosis not present

## 2023-05-19 ENCOUNTER — Other Ambulatory Visit: Payer: Self-pay

## 2023-05-19 ENCOUNTER — Encounter: Payer: Self-pay | Admitting: Emergency Medicine

## 2023-05-19 ENCOUNTER — Emergency Department: Payer: MEDICAID

## 2023-05-19 ENCOUNTER — Inpatient Hospital Stay
Admission: EM | Admit: 2023-05-19 | Discharge: 2023-05-24 | DRG: 280 | Disposition: A | Discharge status: Home / Self Care | Payer: MEDICAID | Attending: Internal Medicine | Admitting: Internal Medicine

## 2023-05-19 DIAGNOSIS — I5022 Chronic systolic (congestive) heart failure: Secondary | ICD-10-CM | POA: Diagnosis present

## 2023-05-19 DIAGNOSIS — Z91141 Patient's other noncompliance with medication regimen due to financial hardship: Secondary | ICD-10-CM | POA: Diagnosis not present

## 2023-05-19 DIAGNOSIS — Z7982 Long term (current) use of aspirin: Secondary | ICD-10-CM

## 2023-05-19 DIAGNOSIS — N182 Chronic kidney disease, stage 2 (mild): Secondary | ICD-10-CM

## 2023-05-19 DIAGNOSIS — Z79899 Other long term (current) drug therapy: Secondary | ICD-10-CM | POA: Diagnosis not present

## 2023-05-19 DIAGNOSIS — F17210 Nicotine dependence, cigarettes, uncomplicated: Secondary | ICD-10-CM | POA: Diagnosis present

## 2023-05-19 DIAGNOSIS — N179 Acute kidney failure, unspecified: Secondary | ICD-10-CM | POA: Diagnosis not present

## 2023-05-19 DIAGNOSIS — I5043 Acute on chronic combined systolic (congestive) and diastolic (congestive) heart failure: Secondary | ICD-10-CM | POA: Diagnosis present

## 2023-05-19 DIAGNOSIS — G4730 Sleep apnea, unspecified: Secondary | ICD-10-CM | POA: Diagnosis present

## 2023-05-19 DIAGNOSIS — I493 Ventricular premature depolarization: Secondary | ICD-10-CM

## 2023-05-19 DIAGNOSIS — I21A1 Myocardial infarction type 2: Secondary | ICD-10-CM | POA: Diagnosis present

## 2023-05-19 DIAGNOSIS — E785 Hyperlipidemia, unspecified: Secondary | ICD-10-CM | POA: Diagnosis present

## 2023-05-19 DIAGNOSIS — Z7984 Long term (current) use of oral hypoglycemic drugs: Secondary | ICD-10-CM

## 2023-05-19 DIAGNOSIS — E861 Hypovolemia: Secondary | ICD-10-CM | POA: Diagnosis not present

## 2023-05-19 DIAGNOSIS — T50996A Underdosing of other drugs, medicaments and biological substances, initial encounter: Secondary | ICD-10-CM | POA: Diagnosis present

## 2023-05-19 DIAGNOSIS — I5023 Acute on chronic systolic (congestive) heart failure: Secondary | ICD-10-CM

## 2023-05-19 DIAGNOSIS — I509 Heart failure, unspecified: Principal | ICD-10-CM

## 2023-05-19 DIAGNOSIS — E871 Hypo-osmolality and hyponatremia: Secondary | ICD-10-CM | POA: Diagnosis present

## 2023-05-19 DIAGNOSIS — Z7901 Long term (current) use of anticoagulants: Secondary | ICD-10-CM | POA: Diagnosis not present

## 2023-05-19 DIAGNOSIS — Z5986 Financial insecurity: Secondary | ICD-10-CM

## 2023-05-19 DIAGNOSIS — I2510 Atherosclerotic heart disease of native coronary artery without angina pectoris: Secondary | ICD-10-CM | POA: Diagnosis present

## 2023-05-19 DIAGNOSIS — I2489 Other forms of acute ischemic heart disease: Secondary | ICD-10-CM

## 2023-05-19 DIAGNOSIS — I130 Hypertensive heart and chronic kidney disease with heart failure and stage 1 through stage 4 chronic kidney disease, or unspecified chronic kidney disease: Secondary | ICD-10-CM | POA: Diagnosis present

## 2023-05-19 DIAGNOSIS — I255 Ischemic cardiomyopathy: Secondary | ICD-10-CM | POA: Diagnosis present

## 2023-05-19 DIAGNOSIS — Z6841 Body Mass Index (BMI) 40.0 and over, adult: Secondary | ICD-10-CM | POA: Diagnosis not present

## 2023-05-19 DIAGNOSIS — I472 Ventricular tachycardia, unspecified: Secondary | ICD-10-CM | POA: Diagnosis present

## 2023-05-19 DIAGNOSIS — I420 Dilated cardiomyopathy: Secondary | ICD-10-CM | POA: Diagnosis present

## 2023-05-19 DIAGNOSIS — M109 Gout, unspecified: Secondary | ICD-10-CM | POA: Diagnosis present

## 2023-05-19 DIAGNOSIS — R7989 Other specified abnormal findings of blood chemistry: Secondary | ICD-10-CM

## 2023-05-19 DIAGNOSIS — I214 Non-ST elevation (NSTEMI) myocardial infarction: Secondary | ICD-10-CM

## 2023-05-19 DIAGNOSIS — I10 Essential (primary) hypertension: Secondary | ICD-10-CM | POA: Diagnosis present

## 2023-05-19 DIAGNOSIS — I272 Pulmonary hypertension, unspecified: Secondary | ICD-10-CM | POA: Diagnosis present

## 2023-05-19 DIAGNOSIS — E739 Lactose intolerance, unspecified: Secondary | ICD-10-CM | POA: Diagnosis present

## 2023-05-19 DIAGNOSIS — Z888 Allergy status to other drugs, medicaments and biological substances status: Secondary | ICD-10-CM

## 2023-05-19 HISTORY — DX: Ventricular premature depolarization: I49.3

## 2023-05-19 HISTORY — DX: Chronic kidney disease, stage 2 (mild): N18.2

## 2023-05-19 HISTORY — DX: Other specified health status: Z78.9

## 2023-05-19 HISTORY — DX: Tobacco use: Z72.0

## 2023-05-19 HISTORY — DX: Other cardiomyopathies: I42.8

## 2023-05-19 HISTORY — DX: Thoracic aortic ectasia: I77.810

## 2023-05-19 HISTORY — DX: Chronic systolic (congestive) heart failure: I50.22

## 2023-05-19 HISTORY — DX: Nonspecific intraventricular block: I45.4

## 2023-05-19 HISTORY — DX: Other ventricular tachycardia: I47.29

## 2023-05-19 HISTORY — DX: Gout, unspecified: M10.9

## 2023-05-19 HISTORY — DX: Ischemic cardiomyopathy: I25.5

## 2023-05-19 HISTORY — DX: Morbid (severe) obesity due to excess calories: E66.01

## 2023-05-19 HISTORY — DX: Hyperlipidemia, unspecified: E78.5

## 2023-05-19 HISTORY — DX: Atherosclerotic heart disease of native coronary artery without angina pectoris: I25.10

## 2023-05-19 LAB — HEPATIC FUNCTION PANEL
ALT: 20 U/L (ref 0–44)
AST: 20 U/L (ref 15–41)
Albumin: 3.5 g/dL (ref 3.5–5.0)
Alkaline Phosphatase: 54 U/L (ref 38–126)
Bilirubin, Direct: 0.3 mg/dL — ABNORMAL HIGH (ref 0.0–0.2)
Indirect Bilirubin: 1.5 mg/dL — ABNORMAL HIGH (ref 0.3–0.9)
Total Bilirubin: 1.8 mg/dL — ABNORMAL HIGH (ref 0.0–1.2)
Total Protein: 7.4 g/dL (ref 6.5–8.1)

## 2023-05-19 LAB — BASIC METABOLIC PANEL
Anion gap: 10 (ref 5–15)
BUN: 21 mg/dL — ABNORMAL HIGH (ref 6–20)
CO2: 21 mmol/L — ABNORMAL LOW (ref 22–32)
Calcium: 8.9 mg/dL (ref 8.9–10.3)
Chloride: 103 mmol/L (ref 98–111)
Creatinine, Ser: 1.06 mg/dL (ref 0.61–1.24)
GFR, Estimated: >60 mL/min (ref >60)
Glucose, Bld: 114 mg/dL — ABNORMAL HIGH (ref 70–99)
Potassium: 3.7 mmol/L (ref 3.5–5.1)
Sodium: 134 mmol/L — ABNORMAL LOW (ref 135–145)

## 2023-05-19 LAB — CBC
HCT: 42.8 % (ref 39.0–52.0)
Hemoglobin: 13.6 g/dL (ref 13.0–17.0)
MCH: 26.1 pg (ref 26.0–34.0)
MCHC: 31.8 g/dL (ref 30.0–36.0)
MCV: 82.1 fL (ref 80.0–100.0)
Platelets: 228 10*3/uL (ref 150–400)
RBC: 5.21 MIL/uL (ref 4.22–5.81)
RDW: 16.9 % — ABNORMAL HIGH (ref 11.5–15.5)
WBC: 7 10*3/uL (ref 4.0–10.5)
nRBC: 0 % (ref 0.0–0.2)

## 2023-05-19 LAB — HEPARIN LEVEL (UNFRACTIONATED)
Heparin Unfractionated: <0.1 [IU]/mL — ABNORMAL LOW (ref 0.30–0.70)
Heparin Unfractionated: 0.22 [IU]/mL — ABNORMAL LOW (ref 0.30–0.70)

## 2023-05-19 LAB — TROPONIN I (HIGH SENSITIVITY)
Troponin I (High Sensitivity): 571 ng/L (ref <18)
Troponin I (High Sensitivity): 573 ng/L (ref <18)

## 2023-05-19 LAB — PROTIME-INR
INR: 1.2 (ref 0.8–1.2)
Prothrombin Time: 15.6 s — ABNORMAL HIGH (ref 11.4–15.2)

## 2023-05-19 LAB — PHOSPHORUS: Phosphorus: 3.5 mg/dL (ref 2.5–4.6)

## 2023-05-19 LAB — BRAIN NATRIURETIC PEPTIDE: B Natriuretic Peptide: 1548.3 pg/mL — ABNORMAL HIGH (ref 0.0–100.0)

## 2023-05-19 LAB — APTT: aPTT: 30 s (ref 24–36)

## 2023-05-19 LAB — MAGNESIUM: Magnesium: 1.9 mg/dL (ref 1.7–2.4)

## 2023-05-19 LAB — TSH: TSH: 1.16 u[IU]/mL (ref 0.350–4.500)

## 2023-05-19 MED ORDER — HEPARIN BOLUS VIA INFUSION
1900.0000 [IU] | Freq: Once | INTRAVENOUS | Status: AC
  Administered 2023-05-19: 1900 [IU] via INTRAVENOUS
  Filled 2023-05-19: qty 1900

## 2023-05-19 MED ORDER — EZETIMIBE 10 MG PO TABS
10.0000 mg | ORAL_TABLET | Freq: Every day | ORAL | Status: DC
  Administered 2023-05-19 – 2023-05-23 (×5): 10 mg via ORAL
  Filled 2023-05-19 (×5): qty 1

## 2023-05-19 MED ORDER — HEPARIN (PORCINE) 25000 UT/250ML-% IV SOLN
2500.0000 [IU]/h | INTRAVENOUS | Status: DC
  Administered 2023-05-19: 1750 [IU]/h via INTRAVENOUS
  Administered 2023-05-20 (×2): 2250 [IU]/h via INTRAVENOUS
  Administered 2023-05-21: 2500 [IU]/h via INTRAVENOUS
  Filled 2023-05-19 (×6): qty 250

## 2023-05-19 MED ORDER — SODIUM CHLORIDE 0.9 % IV SOLN: 250.0000 mL | INTRAVENOUS | Status: AC | PRN

## 2023-05-19 MED ORDER — FUROSEMIDE 10 MG/ML IJ SOLN
40.0000 mg | Freq: Once | INTRAMUSCULAR | Status: AC
  Administered 2023-05-19: 40 mg via INTRAVENOUS
  Filled 2023-05-19: qty 4

## 2023-05-19 MED ORDER — APIXABAN 5 MG PO TABS: 5.0000 mg | ORAL_TABLET | Freq: Two times a day (BID) | ORAL | Status: DC

## 2023-05-19 MED ORDER — ASPIRIN 325 MG PO TABS
325.0000 mg | ORAL_TABLET | Freq: Every day | ORAL | Status: DC
  Administered 2023-05-20 – 2023-05-22 (×3): 325 mg via ORAL
  Filled 2023-05-19 (×3): qty 1

## 2023-05-19 MED ORDER — ONDANSETRON HCL 4 MG/2ML IJ SOLN: 4.0000 mg | Freq: Four times a day (QID) | INTRAMUSCULAR | Status: DC | PRN

## 2023-05-19 MED ORDER — SODIUM CHLORIDE 0.9% FLUSH: 3.0000 mL | INTRAVENOUS | Status: DC | PRN

## 2023-05-19 MED ORDER — HEPARIN BOLUS VIA INFUSION
4000.0000 [IU] | Freq: Once | INTRAVENOUS | Status: AC
  Administered 2023-05-19: 4000 [IU] via INTRAVENOUS
  Filled 2023-05-19: qty 4000

## 2023-05-19 MED ORDER — SPIRONOLACTONE 25 MG PO TABS
25.0000 mg | ORAL_TABLET | Freq: Every day | ORAL | Status: DC
  Administered 2023-05-19 – 2023-05-23 (×5): 25 mg via ORAL
  Filled 2023-05-19 (×5): qty 1

## 2023-05-19 MED ORDER — POTASSIUM CHLORIDE CRYS ER 20 MEQ PO TBCR
40.0000 meq | EXTENDED_RELEASE_TABLET | Freq: Once | ORAL | Status: AC
  Administered 2023-05-19: 40 meq via ORAL
  Filled 2023-05-19: qty 2

## 2023-05-19 MED ORDER — FUROSEMIDE 10 MG/ML IJ SOLN
80.0000 mg | Freq: Two times a day (BID) | INTRAMUSCULAR | Status: DC
  Administered 2023-05-19 – 2023-05-21 (×4): 80 mg via INTRAVENOUS
  Filled 2023-05-19 (×4): qty 8

## 2023-05-19 MED ORDER — IPRATROPIUM-ALBUTEROL 0.5-2.5 (3) MG/3ML IN SOLN
3.0000 mL | Freq: Four times a day (QID) | RESPIRATORY_TRACT | Status: DC
  Administered 2023-05-19 – 2023-05-20 (×4): 3 mL via RESPIRATORY_TRACT
  Filled 2023-05-19 (×5): qty 3

## 2023-05-19 MED ORDER — SODIUM CHLORIDE 0.9% FLUSH
3.0000 mL | Freq: Two times a day (BID) | INTRAVENOUS | Status: DC
  Administered 2023-05-20 – 2023-05-24 (×8): 3 mL via INTRAVENOUS

## 2023-05-19 MED ORDER — METOPROLOL TARTRATE 25 MG PO TABS
25.0000 mg | ORAL_TABLET | Freq: Two times a day (BID) | ORAL | Status: DC
Start: 2023-05-19 — End: 2023-05-19

## 2023-05-19 MED ORDER — METOPROLOL TARTRATE 25 MG PO TABS
12.5000 mg | ORAL_TABLET | Freq: Two times a day (BID) | ORAL | Status: DC
  Administered 2023-05-19 – 2023-05-20 (×3): 12.5 mg via ORAL
  Filled 2023-05-19 (×3): qty 1

## 2023-05-19 MED ORDER — GUAIFENESIN ER 600 MG PO TB12
1200.0000 mg | ORAL_TABLET | Freq: Two times a day (BID) | ORAL | Status: DC
  Administered 2023-05-19 – 2023-05-24 (×11): 1200 mg via ORAL
  Filled 2023-05-19 (×12): qty 2

## 2023-05-19 MED ORDER — HYDRALAZINE HCL 10 MG PO TABS
10.0000 mg | ORAL_TABLET | Freq: Three times a day (TID) | ORAL | Status: DC
  Administered 2023-05-19 – 2023-05-23 (×11): 10 mg via ORAL
  Filled 2023-05-19 (×12): qty 1

## 2023-05-19 MED ORDER — ASPIRIN 325 MG PO TABS
325.0000 mg | ORAL_TABLET | Freq: Once | ORAL | Status: AC
  Administered 2023-05-19: 325 mg via ORAL
  Filled 2023-05-19: qty 1

## 2023-05-19 MED ORDER — ACETAMINOPHEN 325 MG PO TABS: 650.0000 mg | ORAL_TABLET | ORAL | Status: DC | PRN

## 2023-05-19 MED ORDER — ISOSORBIDE DINITRATE 10 MG PO TABS
10.0000 mg | ORAL_TABLET | Freq: Three times a day (TID) | ORAL | Status: DC
  Administered 2023-05-19 – 2023-05-23 (×11): 10 mg via ORAL
  Filled 2023-05-19 (×14): qty 1

## 2023-05-19 MED ORDER — LOSARTAN POTASSIUM 50 MG PO TABS
100.0000 mg | ORAL_TABLET | Freq: Every day | ORAL | Status: DC
  Administered 2023-05-19 – 2023-05-22 (×4): 100 mg via ORAL
  Filled 2023-05-19 (×4): qty 2

## 2023-05-19 NOTE — ED Notes (Signed)
 MD made aware critical troponin 571 at this time.

## 2023-05-19 NOTE — H&P (Signed)
 History and Physical    Philip HITCHENS WJX:[id number] DOB: 03-19-1975 DOA: 05/19/2023  PCP: [facility] (Confirm with patient/family/NH records and if not entered, this has to be entered at [facility] point of entry) Patient coming from: Home  I have personally briefly reviewed patient's old medical records in [facility]  Chief Complaint: Chest pain, cough, SOB  HPI: Philip Monroe is a 48 y.o. male with medical history significant of multivessel CAD, chronic HFrEF with LVEF 15-20 % 2024, ischemic cardiomyopathy, HTN, chronic anticoagulation secondary to CAD and ischemic cardiomyopathy, morbid obesity, presented with worsening of cough shortness of breath and chest pain.  Patient ran out of his CHF medications including spironolactone, hydralazine, Isordil 2 to 3 weeks ago due to loss of insurance coverage.  His symptoms started 1 week ago with worsening of exertional dyspnea, bilateral pleural chest pains and productive cough with thick whitish phlegm.  Denies any fever or chills.  Chest pain has been sharp like worsening with cough and deep breath.  ED Course: Afebrile, blood pressure 130/100, O2 saturation 93% on room air.  Chest x-ray showed pulmonary congestion.  Blood work showed hemoglobin 13.6, WBC 7.0, creatinine 1.0, BUN 21, K3.7.  Troponin 5.1.  EKG showed sinus rhythm with frequent PVCs.  Patient was given IV Lasix 40 mg x 1 and started on heparin drip.  Review of Systems: As per HPI otherwise 14 point review of systems negative.    Past Medical History:  Diagnosis Date   CHF (congestive heart failure) (HCC)    Hypertension     History reviewed. No pertinent surgical history.   reports that he has been smoking cigarettes. He has never used smokeless tobacco. He reports current alcohol use of about 36.0 standard drinks of alcohol per week. He reports that he does not use drugs.  Allergies  Allergen Reactions   Lactose Intolerance (Gi) Diarrhea     History reviewed. No pertinent family history.   Prior to Admission medications   Medication Sig Start Date End Date Taking? Authorizing Provider  aspirin 325 MG tablet Take 325 mg by mouth daily.   Yes [provider]  ELIQUIS 5 MG TABS tablet Take 5 mg by mouth 2 (two) times daily.   Yes [provider]  ezetimibe (ZETIA) 10 MG tablet Take 10 mg by mouth at bedtime.   Yes [provider]  hydrALAZINE (APRESOLINE) 10 MG tablet Take 10 mg by mouth 3 (three) times daily.   Yes [provider]  spironolactone (ALDACTONE) 25 MG tablet Take 25 mg by mouth daily.   Yes [provider]  torsemide (DEMADEX) 20 MG tablet Take 20 mg by mouth 2 (two) times daily. May take an additional tablet (20 mg) as needed for weight gain >2 lbs in 24 hours 08/25/22  Yes [provider]  furosemide (LASIX) 20 MG tablet Take 1 tablet (20 mg total) by mouth 2 (two) times daily. Patient not taking: Reported on 05/19/2023 05/18/15   [name] A, FNP  isosorbide dinitrate (ISORDIL) 10 MG tablet Take 10 mg by mouth 3 (three) times daily.    [provider]  losartan (COZAAR) 100 MG tablet Take 100 mg by mouth daily.    [provider]  Multiple Vitamin (MULTIVITAMIN WITH MINERALS) TABS tablet Take 1 tablet by mouth daily. Patient not taking: Reported on 05/19/2023    [provider]  vitamin B-12 (CYANOCOBALAMIN) 1000 MCG tablet Take 1,000 mcg by mouth daily. Patient not  taking: Reported on 05/19/2023    [provider]    Physical Exam: Vitals:   05/19/23 1144 05/19/23 1300 05/19/23 1330 05/19/23 1400  BP:  139/86 129/77 (!) 124/99  Pulse:  73 (!) 51 74  Resp:  16 (!) 25 18  Temp:      TempSrc:      SpO2:  96% 98% 98%  Weight: (!) 172.4 kg     Height: 6\' 3"  (1.905 m)       Constitutional: NAD, calm, comfortable Vitals:   05/19/23 1144 05/19/23 1300 05/19/23 1330 05/19/23 1400  BP:  139/86 129/77 (!) 124/99  Pulse:   73 (!) 51 74  Resp:  16 (!) 25 18  Temp:      TempSrc:      SpO2:  96% 98% 98%  Weight: (!) 172.4 kg     Height: 6\' 3"  (1.905 m)      Eyes: PERRL, lids and conjunctivae normal ENMT: Mucous membranes are moist. Posterior pharynx clear of any exudate or lesions.Normal dentition.  Neck: normal, supple, no masses, no thyromegaly Respiratory: clear to auscultation bilaterally, no wheezing, fine crackles on bilateral lower fields, increasing respiratory effort. No accessory muscle use.  Cardiovascular: Regular rate and rhythm, no murmurs / rubs / gallops.  2+ extremity edema. 2+ pedal pulses. No carotid bruits.  Abdomen: no tenderness, no masses palpated. No hepatosplenomegaly. Bowel sounds positive.  Musculoskeletal: no clubbing / cyanosis. No joint deformity upper and lower extremities. Good ROM, no contractures. Normal muscle tone.  Skin: no rashes, lesions, ulcers. No induration Neurologic: CN 2-12 grossly intact. Sensation intact, DTR normal. Strength 5/5 in all 4.  Psychiatric: Normal judgment and insight. Alert and oriented x 3. Normal mood.     Labs on Admission: I have personally reviewed following labs and imaging studies  CBC: Recent Labs  Lab 05/19/23 1146  WBC 7.0  HGB 13.6  HCT 42.8  MCV 82.1  PLT 228   Basic Metabolic Panel: Recent Labs  Lab 05/19/23 1146  NA 134*  K 3.7  CL 103  CO2 21*  GLUCOSE 114*  BUN 21*  CREATININE 1.06  CALCIUM 8.9  MG 1.9   GFR: Estimated Creatinine Clearance: 145.9 mL/min (by C-G formula based on SCr of 1.06 mg/dL). Liver Function Tests: Recent Labs  Lab 05/19/23 1146  AST 20  ALT 20  ALKPHOS 54  BILITOT 1.8*  PROT 7.4  ALBUMIN 3.5   No results for input(s): "LIPASE", "AMYLASE" in the last 168 hours. No results for input(s): "AMMONIA" in the last 168 hours. Coagulation Profile: Recent Labs  Lab 05/19/23 1258  INR 1.2   Cardiac Enzymes: No results for input(s): "CKTOTAL", "CKMB", "CKMBINDEX", "TROPONINI" in the  last 168 hours. BNP (last 3 results) No results for input(s): "PROBNP" in the last 8760 hours. HbA1C: No results for input(s): "HGBA1C" in the last 72 hours. CBG: No results for input(s): "GLUCAP" in the last 168 hours. Lipid Profile: No results for input(s): "CHOL", "HDL", "LDLCALC", "TRIG", "CHOLHDL", "LDLDIRECT" in the last 72 hours. Thyroid Function Tests: No results for input(s): "TSH", "T4TOTAL", "FREET4", "T3FREE", "THYROIDAB" in the last 72 hours. Anemia Panel: No results for input(s): "VITAMINB12", "FOLATE", "FERRITIN", "TIBC", "IRON", "RETICCTPCT" in the last 72 hours. Urine analysis: No results found for: "COLORURINE", "APPEARANCEUR", "LABSPEC", "PHURINE", "GLUCOSEU", "HGBUR", "BILIRUBINUR", "KETONESUR", "PROTEINUR", "UROBILINOGEN", "NITRITE", "LEUKOCYTESUR"  Radiological Exams on Admission: No results found.  EKG: Independently reviewed.  Sinus rhythm with frequent PVCs and bigeminy's, no acute ST changes.  Assessment/Plan  Principal Problem:   CHF (congestive heart failure) (HCC) Active Problems:   Chronic systolic heart failure (HCC)   NSTEMI (non-ST elevated myocardial infarction) (HCC)  (please populate well all problems here in Problem List. (For example, if patient is on BP meds at home and you resume or decide to hold them, it is a problem that needs to be her. Same for CAD, COPD, HLD and so on)  Acute on chronic HFrEF decompensation -Secondary to noncompliant with history of medications -Continue IV diuresis Lasix 80 mg twice daily -Echocardiogram -Outpatient follow-up with [facility] cardiology for evaluation of AICD and possible CRT therapy if indicated. -Other DDx, low suspicion for CAP, monitor off antibiotics  NSTEMI -Secondary to noncompliant with CHF and CAD medications -Plan to continue heparin drip for 48 hours -Went to patient record from most recent hospitalization May 2024 at [facility].  LHC showed severe multivessel CAD "severe multivessel CAD including 80%  mid RCA, 100% RPDA, 100% mid LCx, 80% OM1, 80% mid-distal LAD, with no ideal targets for PCI or surgical revascularization with concomitant non-compaction cardiomyopathy. Continue on torsemide 20 mg BID, spironolactone 25 mg daily, losartan 100 mg daily, and carvedilol 6.25 mg BID. " -Coreg was discontinued last year due to bradycardia.  However given that the patient has so frequent PVCs, will restart low-dose of metoprolol.  And may need to consider that patient go back to Coreg to replace hydralazine and Isordil. -Continue Eliquis -Consult Child psychotherapist for medication/insurance aid -Other Ddx, Trop trending 571> 573, support demanding ischemia, however given significant risk of multivessel CAD, plan to continue current treatment for NSTEMI.   Frequent PVCs -Make K> 4.0, magnesium= 1.9. -1 dose of KCl p.o.  HTN -Resume home BP meds including spironolactone, hydralazine,Isordil   DVT prophylaxis: Heparin drip Code Status: Full code Family Communication: None at bedside Disposition Plan: Patient is sick with NSTEMI with known underlying multivessel CAD requiring ACS medications, expect more than 2 midnight hospital stay Consults called: None Admission status: Tele admit   Emeline General MD [facility] Pager [phone]  05/19/2023, 2:12 PM

## 2023-05-19 NOTE — ED Triage Notes (Signed)
 Patient to ED via POV for SOB x1 week with productive cough. States hx of CHF. Has been out of meds x1 week except for fluid pill. States dyspnea with exertion and also having CP.

## 2023-05-19 NOTE — Consult Note (Signed)
 Pharmacy Consult Note - Anticoagulation  Pharmacy Consult for heparin Indication: chest pain/ACS  PATIENT MEASUREMENTS: Height: 6\' 3"  (190.5 cm) Weight: (!) 172.4 kg (380 lb) IBW/kg (Calculated) : 84.5 HEPARIN DW (KG): 125.6  VITAL SIGNS: Temp: 98.6 F (37 C) (03/07 1143) Temp Source: Oral (03/07 1143) BP: 133/101 (03/07 1143) Pulse Rate: 95 (03/07 1143)  Recent Labs    05/19/23 1146  HGB 13.6  HCT 42.8  PLT 228  CREATININE 1.06  TROPONINIHS 571*    Estimated Creatinine Clearance: 145.9 mL/min (by C-G formula based on SCr of 1.06 mg/dL).  PAST MEDICAL HISTORY: Past Medical History:  Diagnosis Date   CHF (congestive heart failure) (HCC)    Hypertension     ASSESSMENT: 48 y.o. male with PMH including HFrEF (07/2022 EF 15-20%), obesity, evere multivessel CAD medically managed, HTN, frequent PVCs  is presenting with chest pain and shortness of breath. Patient endorses having missed many days of both furosemide and Eliquis due to worsening SOB preventing him from reaching medicine. Last dose of Eliquis estimated on 05/08/2023. cTn are trending up and ECG exhibits sinus rhythm but with multiple, frequent PVCs. Patient is on chronic anticoagulation with Eliquis per chart review, which was started in 03/2023 due to frequent PVCs. Pharmacy has been consulted to initiate and manage heparin intravenous infusion.  Pertinent medications: Eliquis prior to admission Last dose 05/08/2023 per patient estimate, as he could not reach medication due to too much dyspnea  Goal(s) of therapy: Heparin level 0.3 - 0.7 units/mL aPTT 66 - 102 seconds Monitor platelets by anticoagulation protocol: Yes   Baseline anticoagulation labs: Recent Labs    05/19/23 1146  HGB 13.6  PLT 228    Date Time aPTT/HL Rate/Comment   PLAN:  Give 4000 units bolus x1; then start heparin infusion at 1750 units/hour. Check heparin level in 6 hours CBC daily while on heparin   Will M. Dareen Piano,  PharmD Clinical Pharmacist 05/19/2023 12:58 PM

## 2023-05-19 NOTE — TOC CM/SW Note (Signed)
 CSW acknowledges consult for medication assistance. CSW sent secure chat to pharmacist to notify.  Charlynn Court, CSW [phone]

## 2023-05-19 NOTE — Consult Note (Signed)
 Pharmacy Consult Note - Anticoagulation  Pharmacy Consult for heparin Indication: chest pain/ACS  PATIENT MEASUREMENTS: Height: 6\' 3"  (190.5 cm) Weight: (!) 172.4 kg (380 lb) IBW/kg (Calculated) : 84.5 HEPARIN DW (KG): 125.6  VITAL SIGNS: Temp: 97.6 F (36.4 C) (03/07 1618) Temp Source: Oral (03/07 1143) BP: 116/63 (03/07 1830) Pulse Rate: 74 (03/07 1830)  Recent Labs    05/19/23 1146 05/19/23 1146 05/19/23 1258 05/19/23 1349 05/19/23 2153  HGB 13.6  --   --   --   --   HCT 42.8  --   --   --   --   PLT 228  --   --   --   --   APTT  --   --  30  --   --   LABPROT  --   --  15.6*  --   --   INR  --   --  1.2  --   --   HEPARINUNFRC  --    < > <0.10*  --  0.22*  CREATININE 1.06  --   --   --   --   TROPONINIHS 571*  --   --  573*  --    < > = values in this interval not displayed.    Estimated Creatinine Clearance: 145.9 mL/min (by C-G formula based on SCr of 1.06 mg/dL).  PAST MEDICAL HISTORY: Past Medical History:  Diagnosis Date   CHF (congestive heart failure) (HCC)    Hypertension     ASSESSMENT: 48 y.o. male with PMH including HFrEF (07/2022 EF 15-20%), obesity, evere multivessel CAD medically managed, HTN, frequent PVCs  is presenting with chest pain and shortness of breath. Patient endorses having missed many days of both furosemide and Eliquis due to worsening SOB preventing him from reaching medicine. Last dose of Eliquis estimated on 05/08/2023. cTn are trending up and ECG exhibits sinus rhythm but with multiple, frequent PVCs. Patient is on chronic anticoagulation with Eliquis per chart review, which was started in 03/2023 due to frequent PVCs. Pharmacy has been consulted to initiate and manage heparin intravenous infusion.  Pertinent medications: Eliquis prior to admission Last dose 05/08/2023 per patient estimate, as he could not reach medication due to too much dyspnea  Goal(s) of therapy: Heparin level 0.3 - 0.7 units/mL aPTT 66 - 102 seconds Monitor  platelets by anticoagulation protocol: Yes   Baseline anticoagulation labs: Recent Labs    05/19/23 1146 05/19/23 1258  APTT  --  30  INR  --  1.2  HGB 13.6  --   PLT 228  --     Date Time aPTT/HL Rate/Comment 3/07 2153 HL 0.22 Subtherapeutic  PLAN:  Give 1900 units bolus x 1 Increase heparin infusion to 2000 units/hour. Recheck heparin level w/ AM labs after rate change CBC daily while on heparin   Otelia Sergeant, PharmD, [facility] 05/19/2023 10:38 PM

## 2023-05-19 NOTE — ED Provider Notes (Signed)
 [facility] Provider Note    Event Date/Time   First MD Initiated Contact with Patient 05/19/23 1208     (approximate)   History   Shortness of Breath   HPI  Philip Monroe is a 48 year old male with history of CHF (EF 15 to 20% on TTE 5/24), CAD, HTN presenting to the emergency department for evaluation of chest pain and shortness of breath.  Reports that he typically takes his torsemide once a day instead of twice a day, but over the last week has had a cough with worsening shortness of breath, so he has not been taking it the past few days as it is too hard to get up the stairs to go to the bathroom.  Reports subjective fevers.  Also reports chest pain throughout this time.  Additionally reports that he has been out of his antihypertensive medications for over a week.  I did review patient's discharge summary from 08/21/2022 in the [facility] system.  At that time, he presented with shortness of breath and lower extremity swelling.  He was diuresed with Lasix.  Noted to have frequent PVCs during admission and was placed on Eliquis.  He follows with their heart failure clinic.  Cath from 07/18/18/2024 demonstrated severe multivessel CAD without ideal targets for PCI or surgical revascularization.       Physical Exam   Triage Vital Signs: ED Triage Vitals  Encounter Vitals Group     BP 05/19/23 1143 (!) 133/101     Systolic BP Percentile --      Diastolic BP Percentile --      Pulse Rate 05/19/23 1143 95     Resp 05/19/23 1143 20     Temp 05/19/23 1143 98.6 F (37 C)     Temp Source 05/19/23 1143 Oral     SpO2 05/19/23 1143 93 %     Weight 05/19/23 1144 (!) 380 lb (172.4 kg)     Height 05/19/23 1144 6\' 3"  (1.905 m)     Head Circumference --      Peak Flow --      Pain Score 05/19/23 1144 8     Pain Loc --      Pain Education --      Exclude from Growth Chart --     Most recent vital signs: Vitals:   05/19/23 1330 05/19/23 1400  BP: 129/77 (!) 124/99   Pulse: (!) 51 74  Resp: (!) 25 18  Temp:    SpO2: 98% 98%     General: Awake, interactive  CV:  Regular rate, good peripheral perfusion.  Resp:  Unlabored respirations, bibasilar crackles, mildly labored respirations but satting in mid 90s on room air Abd:  Nondistended, soft, nontender to palpation Neuro:  Symmetric facial movement, fluid speech   ED Results / Procedures / Treatments   Labs (all labs ordered are listed, but only abnormal results are displayed) Labs Reviewed  BASIC METABOLIC PANEL - Abnormal; Notable for the following components:      Result Value   Sodium 134 (*)    CO2 21 (*)    Glucose, Bld 114 (*)    BUN 21 (*)    All other components within normal limits  CBC - Abnormal; Notable for the following components:   RDW 16.9 (*)    All other components within normal limits  BRAIN NATRIURETIC PEPTIDE - Abnormal; Notable for the following components:   B Natriuretic Peptide 1,548.3 (*)    All other  components within normal limits  HEPATIC FUNCTION PANEL - Abnormal; Notable for the following components:   Total Bilirubin 1.8 (*)    Bilirubin, Direct 0.3 (*)    Indirect Bilirubin 1.5 (*)    All other components within normal limits  HEPARIN LEVEL (UNFRACTIONATED) - Abnormal; Notable for the following components:   Heparin Unfractionated <0.10 (*)    All other components within normal limits  PROTIME-INR - Abnormal; Notable for the following components:   Prothrombin Time 15.6 (*)    All other components within normal limits  TROPONIN I (HIGH SENSITIVITY) - Abnormal; Notable for the following components:   Troponin I (High Sensitivity) 571 (*)    All other components within normal limits  TROPONIN I (HIGH SENSITIVITY) - Abnormal; Notable for the following components:   Troponin I (High Sensitivity) 573 (*)    All other components within normal limits  MAGNESIUM  APTT  PHOSPHORUS  TSH  HEPARIN LEVEL (UNFRACTIONATED)  HIV ANTIBODY (ROUTINE TESTING W  REFLEX)     EKG EKG independently reviewed interpreted by myself (ER attending) demonstrates:  EKG demonstrates sinus rhythm at a rate of 93, PR 182, QRS 124, QTc 482, no acute ST changes  RADIOLOGY Imaging independently reviewed and interpreted by myself demonstrates:  CXR with pulmonary edema on my review, formal radiology read pending  PROCEDURES:  Critical Care performed: Yes, see critical care procedure note(s) CRITICAL CARE Performed by: Trinna Post   Total critical care time: 35 minutes  Critical care time was exclusive of separately billable procedures and treating other patients.  Critical care was necessary to treat or prevent imminent or life-threatening deterioration.  Critical care was time spent personally by me on the following activities: development of treatment plan with patient and/or surrogate as well as nursing, discussions with consultants, evaluation of patient's response to treatment, examination of patient, obtaining history from patient or surrogate, ordering and performing treatments and interventions, ordering and review of laboratory studies, ordering and review of radiographic studies, pulse oximetry and re-evaluation of patient's condition.   Procedures   MEDICATIONS ORDERED IN ED: Medications  heparin ADULT infusion 100 units/mL (25000 units/217mL) (1,750 Units/hr Intravenous New Bag/Given 05/19/23 1417)  furosemide (LASIX) injection 80 mg (has no administration in time range)  aspirin tablet 325 mg (has no administration in time range)  ezetimibe (ZETIA) tablet 10 mg (has no administration in time range)  hydrALAZINE (APRESOLINE) tablet 10 mg (has no administration in time range)  isosorbide dinitrate (ISORDIL) tablet 10 mg (has no administration in time range)  losartan (COZAAR) tablet 100 mg (has no administration in time range)  spironolactone (ALDACTONE) tablet 25 mg (has no administration in time range)  sodium chloride flush (NS) 0.9 %  injection 3 mL (has no administration in time range)  sodium chloride flush (NS) 0.9 % injection 3 mL (has no administration in time range)  0.9 %  sodium chloride infusion (has no administration in time range)  acetaminophen (TYLENOL) tablet 650 mg (has no administration in time range)  ondansetron (ZOFRAN) injection 4 mg (has no administration in time range)  ipratropium-albuterol (DUONEB) 0.5-2.5 (3) MG/3ML nebulizer solution 3 mL (has no administration in time range)  guaiFENesin (MUCINEX) 12 hr tablet 1,200 mg (has no administration in time range)  metoprolol tartrate (LOPRESSOR) tablet 12.5 mg (has no administration in time range)  potassium chloride SA (KLOR-CON M) CR tablet 40 mEq (has no administration in time range)  furosemide (LASIX) injection 40 mg (40 mg Intravenous Given 05/19/23 1246)  aspirin tablet 325 mg (325 mg Oral Given 05/19/23 1348)  heparin bolus via infusion 4,000 Units (4,000 Units Intravenous Bolus from Bag 05/19/23 1417)  furosemide (LASIX) injection 40 mg (40 mg Intravenous Given 05/19/23 1410)     IMPRESSION / MDM / ASSESSMENT AND PLAN / ED COURSE  I reviewed the triage vital signs and the nursing notes.  Differential diagnosis includes, but is not limited to, CHF exacerbation, ACS, pneumonia, pneumothorax, arrhythmia  Patient's presentation is most consistent with acute presentation with potential threat to life or bodily function.  48 year old male presenting to the emergency department for evaluation of shortness of breath.  Stable vitals on presentation, but clinical history concerning for CHF exacerbation in the setting of worsening shortness of breath with medication nonadherence.  Labs with significantly elevated BNP at 1500, elevated troponin at 571.  Possible demand in the setting of CHF exacerbation, but will order aspirin and heparin drip.  EKG with bigeminy, has previously been noted to have frequent PVCs.  CXR with evidence of pulmonary edema on my review,  no obvious focal consolidation.  In the setting of CHF exacerbation with NSTEMI, do think patient is appropriate for admission.  Will reach out to hospitalist team.  Case reviewed with hospitalist team.  Dr. Thedore Mins will evaluate for anticipated admission.     FINAL CLINICAL IMPRESSION(S) / ED DIAGNOSES   Final diagnoses:  Acute on chronic congestive heart failure, unspecified heart failure type (HCC)  NSTEMI (non-ST elevated myocardial infarction) (HCC)     Rx / DC Orders   ED Discharge Orders     None        Note:  This document was prepared using Dragon voice recognition software and may include unintentional dictation errors.   Trinna Post, MD 05/19/23 [phone]

## 2023-05-20 ENCOUNTER — Inpatient Hospital Stay (HOSPITAL_COMMUNITY)
Admit: 2023-05-20 | Discharge: 2023-05-20 | Disposition: A | Discharge status: Home / Self Care | Payer: Self-pay | Attending: Cardiovascular Disease | Admitting: Cardiovascular Disease

## 2023-05-20 ENCOUNTER — Encounter: Payer: Self-pay | Admitting: Internal Medicine

## 2023-05-20 DIAGNOSIS — I5021 Acute systolic (congestive) heart failure: Secondary | ICD-10-CM

## 2023-05-20 DIAGNOSIS — R7989 Other specified abnormal findings of blood chemistry: Secondary | ICD-10-CM

## 2023-05-20 DIAGNOSIS — I493 Ventricular premature depolarization: Secondary | ICD-10-CM

## 2023-05-20 DIAGNOSIS — I214 Non-ST elevation (NSTEMI) myocardial infarction: Secondary | ICD-10-CM

## 2023-05-20 DIAGNOSIS — I5043 Acute on chronic combined systolic (congestive) and diastolic (congestive) heart failure: Secondary | ICD-10-CM

## 2023-05-20 DIAGNOSIS — N182 Chronic kidney disease, stage 2 (mild): Secondary | ICD-10-CM

## 2023-05-20 DIAGNOSIS — E785 Hyperlipidemia, unspecified: Secondary | ICD-10-CM

## 2023-05-20 DIAGNOSIS — I10 Essential (primary) hypertension: Secondary | ICD-10-CM

## 2023-05-20 DIAGNOSIS — I2489 Other forms of acute ischemic heart disease: Secondary | ICD-10-CM

## 2023-05-20 LAB — ECHOCARDIOGRAM COMPLETE
AR max vel: 3.22 cm2
AV Peak grad: 8.5 mmHg
Ao pk vel: 1.46 m/s
Area-P 1/2: 2.46 cm2
Calc EF: 19.2 %
Est EF: <20
Height: 75 in
MV M vel: 4.64 m/s
MV Peak grad: 86.1 mmHg
Radius: 0.8 cm
S' Lateral: 7.8 cm
Single Plane A2C EF: 20.4 %
Single Plane A4C EF: 20.5 %
Weight: 6080 [oz_av]

## 2023-05-20 LAB — BASIC METABOLIC PANEL
Anion gap: 13 (ref 5–15)
BUN: 23 mg/dL — ABNORMAL HIGH (ref 6–20)
CO2: 21 mmol/L — ABNORMAL LOW (ref 22–32)
Calcium: 8.7 mg/dL — ABNORMAL LOW (ref 8.9–10.3)
Chloride: 101 mmol/L (ref 98–111)
Creatinine, Ser: 1.09 mg/dL (ref 0.61–1.24)
GFR, Estimated: >60 mL/min (ref >60)
Glucose, Bld: 102 mg/dL — ABNORMAL HIGH (ref 70–99)
Potassium: 3.7 mmol/L (ref 3.5–5.1)
Sodium: 135 mmol/L (ref 135–145)

## 2023-05-20 LAB — HEPARIN LEVEL (UNFRACTIONATED)
Heparin Unfractionated: 0.27 [IU]/mL — ABNORMAL LOW (ref 0.30–0.70)
Heparin Unfractionated: 0.42 [IU]/mL (ref 0.30–0.70)
Heparin Unfractionated: 0.44 [IU]/mL (ref 0.30–0.70)

## 2023-05-20 LAB — CBC
HCT: 39.4 % (ref 39.0–52.0)
Hemoglobin: 12.7 g/dL — ABNORMAL LOW (ref 13.0–17.0)
MCH: 26.4 pg (ref 26.0–34.0)
MCHC: 32.2 g/dL (ref 30.0–36.0)
MCV: 81.9 fL (ref 80.0–100.0)
Platelets: 206 10*3/uL (ref 150–400)
RBC: 4.81 MIL/uL (ref 4.22–5.81)
RDW: 16.8 % — ABNORMAL HIGH (ref 11.5–15.5)
WBC: 6.9 10*3/uL (ref 4.0–10.5)
nRBC: 0 % (ref 0.0–0.2)

## 2023-05-20 MED ORDER — HEPARIN BOLUS VIA INFUSION
1900.0000 [IU] | Freq: Once | INTRAVENOUS | Status: AC
  Administered 2023-05-20: 1900 [IU] via INTRAVENOUS
  Filled 2023-05-20: qty 1900

## 2023-05-20 MED ORDER — CARVEDILOL 3.125 MG PO TABS
3.1250 mg | ORAL_TABLET | Freq: Two times a day (BID) | ORAL | Status: DC
  Administered 2023-05-20 – 2023-05-21 (×3): 3.125 mg via ORAL
  Filled 2023-05-20 (×4): qty 1

## 2023-05-20 MED ORDER — PERFLUTREN LIPID MICROSPHERE
1.0000 mL | INTRAVENOUS | Status: AC | PRN
  Administered 2023-05-20: 6 mL via INTRAVENOUS

## 2023-05-20 MED ORDER — CARVEDILOL 3.125 MG PO TABS: 3.1250 mg | ORAL_TABLET | Freq: Two times a day (BID) | ORAL | Status: DC

## 2023-05-20 NOTE — Assessment & Plan Note (Signed)
 Estimated body mass index is 47.5 kg/m as calculated from the following:   Height as of this encounter: 6\' 3"  (1.905 m).   Weight as of this encounter: 172.4 kg.   -This will complicate overall prognosis -Encouraged weight loss

## 2023-05-20 NOTE — Hospital Course (Addendum)
 Taken from H&P.   Philip Monroe is a 48 y.o. male with medical history significant of multivessel CAD, chronic HFrEF with LVEF 15-20 % 2024, ischemic cardiomyopathy, HTN, chronic anticoagulation secondary to CAD and ischemic cardiomyopathy, morbid obesity, presented with worsening of cough shortness of breath and chest pain for about a week.  Patient ran out of his CHF medications including spironolactone, hydralazine, Isordil 2 to 3 weeks ago due to loss of insurance coverage.   On presentation hemodynamically stable on room air, chest x-ray with pulmonary congestion.  Labs seems stable, troponin 571, EKG with NSR and frequent PVCs.  Patient was started on heparin infusion and IV diuresis.  3/8: Vital stable, troponin with a flat trend, BNP 1548, echocardiogram with EF of less than 20% with severely dilated chambers.  Mild to moderate MR. Advanced heart failure team was also consulted.  3/9: Vital stable, UOP was not recorded.  IV Lasix was increased to Q8 hourly by cardiology.  Might need milrinone with Lasix infusion if UOP remains low.  Told nurse again for strict intake and output  3/10: Hemodynamically stable UOP of 2600 recorded, 2 pound decrease in weight.  Renal function stable, mild hyponatremia with sodium at 132 likely with IV Lasix use.  Heart failure team switched heparin infusion with Eliquis, change losartan with Entresto, added metolazone and advising to continue diuresis.  High PVC load on telemetry, home amiodarone is also being switched with mexiletine.  3/11: Remained hemodynamically stable, slightly increase in creatinine so IV Lasix dose was decreased to twice daily and no metolazone today.  Advanced heart failure team is following.  Decreased PVC burden with mexiletine.  3/12: Hemodynamically stable but blood pressure little soft.  Increasing creatinine so holding further IV diuresis and other medications today.  Heart failure team is recommending starting low-dose  torsemide 20 mg daily, decreasing the dose of losartan to 25 mg as he is still waiting for approval of Entresto and other medications from tomorrow.  They will follow-up very closely as outpatient for further recommendations.  An appointment was provided for this upcoming Friday with Philip Monroe.  Patient will continue on current medications and need to have a close follow-up with his providers.

## 2023-05-20 NOTE — Progress Notes (Addendum)
 Progress Note   [name] MVH:[id number] DOB: Dec 16, 1975 DOA: 05/19/2023     1 DOS: the patient was seen and examined on 05/20/2023   Brief hospital course: Taken from H&P.   Philip Monroe is a 48 y.o. male with medical history significant of multivessel CAD, chronic HFrEF with LVEF 15-20 % 2024, ischemic cardiomyopathy, HTN, chronic anticoagulation secondary to CAD and ischemic cardiomyopathy, morbid obesity, presented with worsening of cough shortness of breath and chest pain for about a week.  Patient ran out of his CHF medications including spironolactone, hydralazine, Isordil 2 to 3 weeks ago due to loss of insurance coverage.   On presentation hemodynamically stable on room air, chest x-ray with pulmonary congestion.  Labs seems stable, troponin 571, EKG with NSR and frequent PVCs.  Patient was started on heparin infusion and IV diuresis.  3/8: Vital stable, troponin with a flat trend, BNP 1548, echocardiogram with EF of less than 20% with severely dilated chambers.  Mild to moderate MR. Advanced heart failure team was also consulted.    Assessment and Plan: * Acute on chronic combined systolic and diastolic CHF (congestive heart failure) (HCC) History of EF of 15 to 20% with grade 2 diastolic dysfunction, moderate to severe pulmonary hypertension.  Multiple hospitalization due to acute exacerbation.  Patient ran out of his medications due to financial reason and change in insurance. Repeat echocardiogram with less than 20% EF, severely dilated chambers and indeterminate diastolic function. -Continue with IV diuresis -Daily weight and BMP -Strict intake and output -Continue with Coreg, losartan. -Cardiology was also consulted -Patient was also on Eliquis at home for concern of preventing apical thrombus due to severely dilated cardiomyopathy.  NSTEMI (non-ST elevated myocardial infarction) (HCC) Troponin mildly elevated with a flat curve.  Likely demand ischemia  with history of significant CAD.  No chest pain now Cardiac cath from last year with severe multivessel disease including 80% mid RCA, 100% RPDA, 100% mid LCx, 80% OM1, 80% mid-distal LAD, with no ideal targets for PCI or surgical revascularization with concomitant non-compaction cardiomyopathy.  Medical management was suggested. History of multiple PVCs, his home Coreg was apparently discontinued for concern of bradycardia last year. Cardiology restarted home Coreg -Continue with heparin infusion for 48 hours -Continue aspirin, Zetia, nitrate and beta-blocker -Cardiology is on board  PVC's (premature ventricular contractions) Frequent PVCs noted on monitor.  Patient has severely dilated ischemic cardiomyopathy. -Home Coreg was restarted -Monitor electrolyte closely to keep the potassium more than 4 and magnesium more than 2. -Patient likely need evaluation for AICD  Hypertension Blood pressure currently within goal -Continue with current medications  Hyperlipidemia LDL goal <70 Patient apparently have intolerance to statin, he was on Repatha and unable to take it now due to financial difficulties. -Continue with Zetia -Outpatient pharmacy help to resume Repatha or explore other options  CKD (chronic kidney disease), stage II Renal function currently seems stable. -Monitor renal function closely while patient is being diuresed -Avoid nephrotoxins  Morbid obesity (HCC) Estimated body mass index is 47.5 kg/m as calculated from the following:   Height as of this encounter: 6\' 3"  (1.905 m).   Weight as of this encounter: 172.4 kg.   -This will complicate overall prognosis -Encouraged weight loss   Subjective: Patient was sitting at the side of the bed when seen today.  Still unable to lay flat.  No chest pain.  Physical Exam: Vitals:   05/20/23 0800 05/20/23 0900 05/20/23 0925 05/20/23 1100  BP: 105/81 128/80  128/80 113/89  Pulse: 75  76 85  Resp: (!) 35 15  (!) 22  Temp:    97.7 F (36.5 C)   TempSrc:   Oral   SpO2: 96% 98%  94%  Weight:      Height:       General.  Morbidly obese gentleman, in no acute distress. Pulmonary.  Basal crackles bilaterally, normal respiratory effort. CV.  Regular rate and rhythm, no JVD, rub or murmur. Abdomen.  Soft, nontender, nondistended, BS positive. CNS.  Alert and oriented .  No focal neurologic deficit. Extremities.  1+ LE edema, no cyanosis, pulses intact and symmetrical. Psychiatry.  Judgment and insight appears normal.   Data Reviewed: Prior data reviewed  Family Communication: Discussed with patient  Disposition: Status is: Inpatient Remains inpatient appropriate because: Severity of illness  Planned Discharge Destination: Home  DVT prophylaxis.  Heparin infusion Time spent: 50 minutes  This record has been created using Conservation officer, historic buildings. Errors have been sought and corrected,but may not always be located. Such creation errors do not reflect on the standard of care.   Author: Arnetha Courser, MD 05/20/2023 12:59 PM  For on call review [email or web address].

## 2023-05-20 NOTE — Consult Note (Signed)
 Pharmacy Consult Note - Anticoagulation  Pharmacy Consult for heparin Indication: chest pain/ACS  PATIENT MEASUREMENTS: Height: 6\' 3"  (190.5 cm) Weight: (!) 172.4 kg (380 lb) IBW/kg (Calculated) : 84.5 HEPARIN DW (KG): 125.6  VITAL SIGNS: Temp: 98.2 F (36.8 C) (03/08 0500) BP: 104/62 (03/08 0200) Pulse Rate: 80 (03/08 0200)  Recent Labs    05/19/23 1258 05/19/23 1349 05/19/23 2153 05/20/23 0506  HGB  --   --   --  12.7*  HCT  --   --   --  39.4  PLT  --   --   --  206  APTT 30  --   --   --   LABPROT 15.6*  --   --   --   INR 1.2  --   --   --   HEPARINUNFRC <0.10*  --    < > 0.27*  CREATININE  --   --   --  1.09  TROPONINIHS  --  573*  --   --    < > = values in this interval not displayed.    Estimated Creatinine Clearance: 141.8 mL/min (by C-G formula based on SCr of 1.09 mg/dL).  PAST MEDICAL HISTORY: Past Medical History:  Diagnosis Date   CHF (congestive heart failure) (HCC)    Hypertension     ASSESSMENT: 49 y.o. male with PMH including HFrEF (07/2022 EF 15-20%), obesity, evere multivessel CAD medically managed, HTN, frequent PVCs  is presenting with chest pain and shortness of breath. Patient endorses having missed many days of both furosemide and Eliquis due to worsening SOB preventing him from reaching medicine. Last dose of Eliquis estimated on 05/08/2023. cTn are trending up and ECG exhibits sinus rhythm but with multiple, frequent PVCs. Patient is on chronic anticoagulation with Eliquis per chart review, which was started in 03/2023 due to frequent PVCs. Pharmacy has been consulted to initiate and manage heparin intravenous infusion.  Pertinent medications: Eliquis prior to admission Last dose 05/08/2023 per patient estimate, as he could not reach medication due to too much dyspnea  Goal(s) of therapy: Heparin level 0.3 - 0.7 units/mL aPTT 66 - 102 seconds Monitor platelets by anticoagulation protocol: Yes   Baseline anticoagulation labs: Recent Labs     05/19/23 1146 05/19/23 1258 05/20/23 0506  APTT  --  30  --   INR  --  1.2  --   HGB 13.6  --  12.7*  PLT 228  --  206    Date Time aPTT/HL Rate/Comment 3/07 2153 HL 0.22 Subtherapeutic 3/08 0506 HL 0.27 Subtherapeutic  PLAN Give 1900 units bolus x 1 Increase heparin infusion to 2250 units/hour. Recheck heparin level in 6 hrs after rate change CBC daily while on heparin   Otelia Sergeant, PharmD, [facility] 05/20/2023 6:25 AM

## 2023-05-20 NOTE — Assessment & Plan Note (Addendum)
 Troponin mildly elevated with a flat curve.  Likely demand ischemia with history of significant CAD.  No chest pain now Cardiac cath from last year with severe multivessel disease including 80% mid RCA, 100% RPDA, 100% mid LCx, 80% OM1, 80% mid-distal LAD, with no ideal targets for PCI or surgical revascularization with concomitant non-compaction cardiomyopathy.  Medical management was suggested. History of multiple PVCs, his home Coreg was apparently discontinued for concern of bradycardia last year. Cardiology restarted home Coreg -Continue with heparin infusion for 48 hours -Continue aspirin, Zetia, nitrate and beta-blocker -Cardiology is on board

## 2023-05-20 NOTE — Progress Notes (Signed)
  Echocardiogram 2D Echocardiogram has been performed. Definity IV ultrasound imaging agent used on this study.  Philip Monroe 05/20/2023, 12:02 PM

## 2023-05-20 NOTE — Assessment & Plan Note (Addendum)
 History of EF of 15 to 20% with grade 2 diastolic dysfunction, moderate to severe pulmonary hypertension.  Multiple hospitalization due to acute exacerbation.  Patient ran out of his medications due to financial reason and change in insurance. Repeat echocardiogram with less than 20% EF, severely dilated chambers and indeterminate diastolic function. -Continue with IV diuresis -Daily weight and BMP -Strict intake and output -Continue with Coreg, losartan. -Cardiology was also consulted -Patient was also on Eliquis at home for concern of preventing apical thrombus due to severely dilated cardiomyopathy.

## 2023-05-20 NOTE — Consult Note (Signed)
 PHARMACY - ANTICOAGULATION CONSULT NOTE  Pharmacy Consult for Heparin Infusion Indication: chest pain/ACS  Allergies  Allergen Reactions   Lactose Intolerance (Gi) Diarrhea   Statins     myalgias    Patient Measurements: Height: 6\' 3"  (190.5 cm) Weight: (!) 172.4 kg (380 lb) IBW/kg (Calculated) : 84.5 Heparin Dosing Weight: 125.6 kg  Vital Signs: Temp: 97.8 F (36.6 C) (03/08 1718) Temp Source: Oral (03/08 1718) BP: 114/84 (03/08 1846) Pulse Rate: 76 (03/08 1846)  Labs: Recent Labs    05/19/23 1146 05/19/23 1258 05/19/23 1349 05/19/23 2153 05/20/23 0506 05/20/23 1325 05/20/23 1927  HGB 13.6  --   --   --  12.7*  --   --   HCT 42.8  --   --   --  39.4  --   --   PLT 228  --   --   --  206  --   --   APTT  --  30  --   --   --   --   --   LABPROT  --  15.6*  --   --   --   --   --   INR  --  1.2  --   --   --   --   --   HEPARINUNFRC  --  <0.10*  --    < > 0.27* 0.42 0.44  CREATININE 1.06  --   --   --  1.09  --   --   TROPONINIHS 571*  --  573*  --   --   --   --    < > = values in this interval not displayed.    Estimated Creatinine Clearance: 141.8 mL/min (by C-G formula based on SCr of 1.09 mg/dL).   Medical History: Past Medical History:  Diagnosis Date   Aortic root dilatation (HCC)    a. 07/2022 Echo: Ao root 4.4cm, Asc Ao 4.0cm.   CAD (coronary artery disease)    a. 08/2017 Cath: LM nl, LAD 10m/d, D1 70ost, LCX 157m, OM1 90 inf branch, 60 sup branch, OM2 small, RCA 30 diffuse, RPDA 100 w/ L->R collats; b. 07/2022 Echo: LM nl, LAD 39m, D1 80, LCX 161m, OM1 80, RCA 27m, RPDA 100-->No targets for PCI/CABG-->Med rx.   Chronic HFrEF (heart failure with reduced ejection fraction) (HCC)    a. 03/2015 Echo: EF 35-40%; b. 08/2017 Echo: EF 15%; c. 08/2017 cMRI: EF 35%; d. 06/2018 Echo: EF 10-15%; e. 07/2022 Echo: EF 15-20%, grIII DD, mild MR, sev dil LA, mod-sev PH, Ao root 4.4cm, Asc Ao 4.0cm; f. 07/2022 RHC: RA 4, RV 38/3, PA 43/18 (26), PCWP 11. CO/CI 5.3/1.8.   CKD  (chronic kidney disease), stage II    Gout    Hyperlipidemia LDL goal <70    Hypertension    Ischemic cardiomyopathy    a. 03/2015 Echo: EF 35-40%; b. 08/2017 Echo: EF 15%; c. 08/2017 cMRI: EF 35%; d. 06/2018 Echo: EF 10-15%; e. 07/2022 Echo: EF 15-20%, grIII DD.   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 09/2022 Event monitor: predominantly sinus rhythm, avg HR 81 (46-167). 17 runs NSVT (fastest/longest 13 beats x 167). Rare PACs, 17.5% PVC burden.   PVC's (premature ventricular contractions)    a. 09/2022 Event Monitor: 17.5% PVC burden.   Statin intolerance    Tobacco abuse  Assessment: Philip Monroe is a 48 y.o. male presenting with chest pain and SOB. PMH significant for HFrEF (07/2022 EF 15-20%), obesity, severe multivessel CAD, HTN, frequent PVCs . Patient was on [facility] PTA per chart review.  Patient endorses having missed many days of both furosemide and Eliquis due to worsening SOB preventing him from reaching medicine. Last dose of Eliquis estimated on 05/08/2023. cTn are trending up and ECG exhibits sinus rhythm but with multiple, frequent PVCs. Patient is on chronic anticoagulation with Eliquis per chart review, which was started in 03/2023 due to frequent PVCs. Pharmacy has been consulted to initiate and manage heparin infusion.   Baseline Labs: aPTT 30, HL <0.10, PT 15.6, INR 1.2, Hgb 13.6, Hct 42.8, Plt 228   Goal of Therapy:  Heparin level 0.3-0.7 units/ml aPTT 66-102 seconds Monitor platelets by anticoagulation protocol: Yes   Date Time HL Rate/Comment  3/7 2153 0.22 1750/subtherapeutic 3/8 0506 0.27 2000/subtherapeutic 3/8 1325 0.42 2250/therapeutic x1 3/8 1927 0.44 2250/therapeutic x2  Plan:  Continue heparin infusion at 2250 units/hr Check HL daily while on heparin infusion Continue to monitor H&H and platelets daily while on heparin infusion    Celene Squibb, PharmD Clinical Pharmacist 05/20/2023 8:22 PM

## 2023-05-20 NOTE — Assessment & Plan Note (Signed)
 Renal function currently seems stable. -Monitor renal function closely while patient is being diuresed -Avoid nephrotoxins

## 2023-05-20 NOTE — Assessment & Plan Note (Signed)
 Blood pressure currently within goal -Continue with current medications

## 2023-05-20 NOTE — Assessment & Plan Note (Signed)
 Frequent PVCs noted on monitor.  Patient has severely dilated ischemic cardiomyopathy. -Home Coreg was restarted -Monitor electrolyte closely to keep the potassium more than 4 and magnesium more than 2. -Patient likely need evaluation for AICD

## 2023-05-20 NOTE — Consult Note (Signed)
 Cardiology Consult    Patient ID: Philip Monroe MRN: [id number], DOB/AGE: Jan 22, 1976   Admit date: 05/19/2023 Date of Consult: 05/20/2023  Primary Physician: [facility] Primary Cardiologist: [facility] Requesting Provider: Kathie Rhodes. Nelson Chimes, MD  Patient Profile    Philip Monroe is a 48 y.o. male with a history of severe multivessel CAD, ICM, HFrEF, LV non-compaction, NSVT, freq PVCs, HTN, HL, IVCD, morbid obesity, Asc Ao/Ao root dilatation, CKD II, statin intolerance, gout, and probable undiagnosed OSA, who is being seen today for the evaluation of acute on chronic HFrEF and elevated hsTroponin at the request of Dr. Nelson Chimes.  Past Medical History  Subjective  Past Medical History:  Diagnosis Date   Aortic root dilatation (HCC)    a. 07/2022 Echo: Ao root 4.4cm, Asc Ao 4.0cm.   CAD (coronary artery disease)    a. 08/2017 Cath: LM nl, LAD 24m/d, D1 70ost, LCX 151m, OM1 90 inf branch, 60 sup branch, OM2 small, RCA 30 diffuse, RPDA 100 w/ L->R collats; b. 07/2022 Echo: LM nl, LAD 54m, D1 80, LCX 161m, OM1 80, RCA 26m, RPDA 100-->No targets for PCI/CABG-->Med rx.   Chronic HFrEF (heart failure with reduced ejection fraction) (HCC)    a. 03/2015 Echo: EF 35-40%; b. 08/2017 Echo: EF 15%; c. 08/2017 cMRI: EF 35%; d. 06/2018 Echo: EF 10-15%; e. 07/2022 Echo: EF 15-20%, grIII DD, mild MR, sev dil LA, mod-sev PH, Ao root 4.4cm, Asc Ao 4.0cm; f. 07/2022 RHC: RA 4, RV 38/3, PA 43/18 (26), PCWP 11. CO/CI 5.3/1.8.   CKD (chronic kidney disease), stage II    Gout    Hyperlipidemia LDL goal <70    Hypertension    Ischemic cardiomyopathy    a. 03/2015 Echo: EF 35-40%; b. 08/2017 Echo: EF 15%; c. 08/2017 cMRI: EF 35%; d. 06/2018 Echo: EF 10-15%; e. 07/2022 Echo: EF 15-20%, grIII DD.   IVCD (intraventricular conduction defect)    Left ventricular noncompaction [facility])    Morbid obesity (HCC)    NSVT (nonsustained ventricular tachycardia) (HCC)    a. 09/2022 Event monitor: predominantly sinus rhythm, avg HR 81  (46-167). 17 runs NSVT (fastest/longest 13 beats x 167). Rare PACs, 17.5% PVC burden.   PVC's (premature ventricular contractions)    a. 09/2022 Event Monitor: 17.5% PVC burden.   Statin intolerance    Tobacco abuse     History reviewed. No pertinent surgical history.   Allergies  Allergies  Allergen Reactions   Lactose Intolerance (Gi) Diarrhea   Statins     myalgias      History of Present Illness   48 y/o ? with a history of severe multivessel CAD, ICM, HFrEF, LV non-compaction, NSVT, freq PVCs, HTN, HL, IVCD, morbid obesity, Asc Ao/Ao root dilatation, gout, CKD II, statin intolerance, and probable undiagnosed OSA.  He was initially diagnosed with cardiomyopathy in 2017, at which time his EF was 35 to 40%.  He has since been followed at [facility] with persistent LV dysfunction with an EF of 15% by echo in June 2019.  This was followed by diagnostic catheterization which showed severe multivessel CAD including an occluded left circumflex and RPDA, along with moderate LAD and RCA disease and severe obtuse marginal disease.  He was medically managed.  Cardiac MRI in June 2019 showed an EF of 35% with severely dilated LV, no definite LGE, and filling defect in the left atrial appendage concerning for small thrombus.  3:1 ration of trabeculations to compacted myocardium was noted and he was  dx w/ LV non-compaction.    More recently, he required admission in May 2024 due to heart failure.  Echo at that time showed an EF of 15 to 20% with grade 3 diastolic dysfunction and moderate to severe pulmonary hypertension.  He was diuresed.  Diagnostic catheterization again showed severe multivessel coronary artery disease, now with 80% mid LAD and D1 stenoses, and an occluded mid left circumflex, 80% OM1 stenosis, 80% mid RCA stenosis, and occluded RPDA.  Right heart pressures notable for a PA of 43/18 (26), and wedge of 11, with cardiac output of 5.3 L/min, and cardiac index of 1.8 L/min/m.  He was not felt to  have any targets for intervention and therefore, he was managed medically.  He was readmitted in June 2024, again with heart failure requiring intravenous diuresis.  In the setting of LV noncompaction and severely depressed EF, he was placed on Eliquis 5 mg twice daily.  Further, he was noted to have frequent PVCs throughout hospitalization and was discharged home with a ZIO monitor.  Medications at discharge included isosorbide dinitrate 10 mg 3 times daily, hydralazine 10 mg 3 times daily, carvedilol 6.25 mg twice daily, spironolactone 25 mg daily, losartan 100 mg daily, Eliquis 5 mg twice daily, Eliquis 5 mg twice daily, torsemide 20 mg twice daily, and Repatha 140 mg every 2 weeks.  Subsequent event monitor showed predominantly sinus rhythm with 17 brief runs of nonsustained VT and 17.5% PVC burden.  He was last seen in cardiology clinic in July 2024 at which time he was treated with colchicine for gout flare, with notation that ICD/CRT-D would be considered in the future pending adherence.  Philip Monroe says that he was doing well at the beginning of the year but that due to changes in his insurance, his medication became very expensive, namely his Eliquis.  He bought enough medicine to last him through the first 6 weeks or so of the year but has been out of everything but his torsemide for at least the past week.  He has also noticed that in the past 4 weeks, he has been dealing with upper respiratory congestion and cough with progressive dyspnea.  He does not routinely weigh himself at home and does not think that he has had any change in his appetite, abdominal girth, or any development of lower extremity edema.  He has noted chest pain associated with cough as well as mild chest discomfort when walking upstairs.  Over the past 2 days, he has noted worsening orthopnea and dyspnea at rest as well as ongoing cough, prompting him present to the emergency department on March 7.  On arrival, he was afebrile with a  blood pressure 133/101.  EKG showed sinus rhythm at 93 with frequent PVCs, prior inferior infarct, and IVCD.  Labs notable for BNP 1548.3, hsTrop 571  573.  BUN/Creat 21/1.06.  H/H 13.6/42.8.  Chest x-ray showed pulmonary edema and marked cardiomegaly.  He was treated with a total of 120 mg of intravenous Lasix on March 7, and placed on heparin as well as his prior home medications with the exception of metoprolol being exchanged out for carvedilol (he does not have his carvedilol bottle).  He is currently sitting up without complaints. Inpatient Medications  Subjective    aspirin  325 mg Oral Daily   ezetimibe  10 mg Oral QHS   furosemide  80 mg Intravenous BID   guaiFENesin  1,200 mg Oral BID   hydrALAZINE  10 mg Oral TID  ipratropium-albuterol  3 mL Nebulization Q6H   isosorbide dinitrate  10 mg Oral TID   losartan  100 mg Oral Daily   metoprolol tartrate  12.5 mg Oral BID   sodium chloride flush  3 mL Intravenous Q12H   spironolactone  25 mg Oral Daily    Family History    History reviewed. No pertinent family history. has no family status information on file.    Social History    Social History   Socioeconomic History   Marital status: Single    Spouse name: Not on file   Number of children: Not on file   Years of education: Not on file   Highest education level: Not on file  Occupational History   Not on file  Tobacco Use   Smoking status: Some Days    Current packs/day: 0.00    Types: Cigarettes    Last attempt to quit: 03/20/2015    Years since quitting: 8.1   Smokeless tobacco: Never  Substance and Sexual Activity   Alcohol use: Yes    Alcohol/week: 36.0 standard drinks of alcohol    Types: 36 Cans of beer per week    Comment: 18 cans of beer on Friday and again on Saturday nights   Drug use: No   Sexual activity: Yes    Birth control/protection: Coitus interruptus, Condom  Other Topics Concern   Not on file  Social History Narrative   Not on file    Social Drivers of Health   Financial Resource Strain: High Risk (07/19/2022)   Received from [facility]   Overall Financial Resource Strain (CARDIA)    Difficulty of Paying Living Expenses: Very hard  Food Insecurity: No Food Insecurity (07/19/2022)   Received from [facility]   Hunger Vital Sign    Worried About Running Out of Food in the Last Year: Never true    Ran Out of Food in the Last Year: Never true  Transportation Needs: No Transportation Needs (07/19/2022)   Received [facility]   PRAPARE - Transportation    Lack of Transportation (Medical): No    Lack of Transportation (Non-Medical): No  Physical Activity: Not on file  Stress: Not on file  Social Connections: Not on file  Intimate Partner Violence: Not on file     Review of Systems    General:  No chills, fever, night sweats or weight changes.  Cardiovascular:  +++ chest pain, +++ dyspnea on exertion, no edema, +++ orthopnea, no palpitations, paroxysmal nocturnal dyspnea. Dermatological: No rash, lesions/masses Respiratory: +++ cough, +++ dyspnea Urologic: No hematuria, dysuria Abdominal:   No nausea, vomiting, diarrhea, bright red blood per rectum, melena, or hematemesis Neurologic:  No visual changes, wkns, changes in mental status. All other systems reviewed and are otherwise negative except as noted above.    Objective  Physical Exam    Blood pressure 113/89, pulse 85, temperature 97.7 F (36.5 C), temperature source Oral, resp. rate (!) 22, height 6\' 3"  (1.905 m), weight (!) 172.4 kg, SpO2 94%.  General: Pleasant, NAD Psych: Normal affect. Neuro: Alert and oriented X 3. Moves all extremities spontaneously. HEENT: Normal  Neck: Supple, obese, difficult to gauge JVP. Lungs:  Resp regular and unlabored, diminished breath sounds bilaterally. Heart: Irregular, frequent ectopy no s3, s4, or murmurs. Abdomen: Obese, soft, non-tender, non-distended, BS + x 4.  Extremities: No clubbing, cyanosis  or edema. DP/PT2+, Radials 2+ and equal bilaterally.  Labs    Cardiac Enzymes Recent Labs  Lab 05/19/23 1146 05/19/23 1349  TROPONINIHS 571* 573*     BNP    Component Value Date/Time   BNP 1,548.3 (H) 05/19/2023 1146    Lab Results  Component Value Date   WBC 6.9 05/20/2023   HGB 12.7 (L) 05/20/2023   HCT 39.4 05/20/2023   MCV 81.9 05/20/2023   PLT 206 05/20/2023    Recent Labs  Lab 05/19/23 1146 05/20/23 0506  NA 134* 135  K 3.7 3.7  CL 103 101  CO2 21* 21*  BUN 21* 23*  CREATININE 1.06 1.09  CALCIUM 8.9 8.7*  PROT 7.4  --   BILITOT 1.8*  --   ALKPHOS 54  --   ALT 20  --   AST 20  --   GLUCOSE 114* 102*   Lab Results  Component Value Date   CHOL 138 04/01/2015   HDL 28 (L) 04/01/2015   LDLCALC 87 04/01/2015   TRIG 114 04/01/2015   No results found for: "DDIMER"    Radiology Studies    DG Chest 2 View Result Date: 05/19/2023 CLINICAL DATA:  One-week history of shortness of breath and productive cough EXAM: CHEST - 2 VIEW COMPARISON:  Chest radiograph dated 07/24/2017 FINDINGS: Normal lung volumes. Diffuse bilateral interstitial opacities. No pleural effusion or pneumothorax. Similar markedly enlarged cardiomediastinal silhouette. No acute osseous abnormality. IMPRESSION: Findings of pulmonary edema and marked cardiomegaly. Electronically Signed   By: Agustin Cree M.D.   On: 05/19/2023 15:18      ECG & Cardiac Imaging    RSR, 93, freq PVCs, inf infarct, IVCD - personally reviewed.  Assessment & Plan    1.  Acute on chronic heart failure with reduced ejection fraction/ischemic cardiomyopathy/left ventricular noncompaction: History of HFrEF and cardiomyopathy dating back to at least 2017.  Known severe multivessel CAD on most recent catheterization May 2024, which has been medically managed in the setting of good interventional options.  Last seen in cardiology clinic at [facility] in July 2024.  He ran out of his medications about a week ago due to financial  difficulties in the setting of insurance changes earlier this year.  He notes he has continued to take torsemide.  Over the past 4 weeks, he has noted progressive dyspnea on exertion as well as cough which she felt was consistent with an upper respiratory infection, but over the past 2 days, he has had dyspnea at rest and also orthopnea prompting presentation.  Here, BNP elevated at 1548.3.  Troponin up to 573.  Chest x-ray consistent with pulmonary edema.  Body habitus makes exam challenging.  He has received a total of 120 mg IV Lasix yesterday and is currently ordered Lasix 80 mg IV twice daily.  He has noted good urine output with stable renal function.  Continue aggressive IV diuresis as well as ARB, hydralazine/nitrate, MRA, and beta-blocker therapy (transition back to carvedilol).  Discussed the importance of medication compliance and alerting his medical team if he runs out of medications.  Of note, he had been on eliquis in setting of non-compaction and concern for development of apical thrombus.  He has been off of this for at least a week.  Plan to resume if financially feasible at d/c - involve pharmacy team.  2.  CAD/demand ischemia: Known, severe multivessel CAD including 80% mid LAD and D1 stenoses, occluded mid left circumflex, 80% OM1 stenosis, 80% mid RCA stenosis, and occluded RPDA on diagnostic catheterization at [facility] May 2024.  He has been medically managed in  the setting of no good interventional options.  He has had occasional chest discomfort when walking up stairs as well as more pleuritic pain with coughing.  In the setting of admission for above, troponin up to 573.  Currently chest pain-free.  Continue heparin x 48 hours.  Cycle troponin to peak.  Continue aspirin, Zetia, nitrate, and beta-blocker.  No likely plan for ischemic evaluation in setting of known disease.  3.  Primary hypertension: Relatively stable.  Resuming home medications as outlined above.  4.  Hyperlipidemia: Statin  intolerant.  Continue Zetia.  Was previously on Repatha as outpatient though recently having financial difficulties.  Will be helpful to have pharmacy evaluate his options.  5.  Stage II chronic kidney disease: Stable.  Follow with diuresis.  6.  Nonsustained VT/frequent PVCs: 17% PVC burden noted on monitoring in June 2024.  He has been off of carvedilol for at least 1 week.  Resume and follow.  Consider antiarrhythmic therapy with PVCs likely contributing to heart failure/cardiomyopathy.  7.  Probable sleep apnea: Notes snoring with desaturations on monitoring overnight.  Will benefit from outpatient sleep study.  Risk Assessment/Risk Scores:     TIMI Risk Score for Unstable Angina or Non-ST Elevation MI:   The patient's TIMI risk score is 4, which indicates a 20% risk of all cause mortality, new or recurrent myocardial infarction or need for urgent revascularization in the next 14 days.  New York Heart Association (NYHA) Functional Class NYHA Class IV    Signed, Nicolasa Ducking, NP 05/20/2023, 12:18 PM  For questions or updates, please contact   Please consult [email or web address] for contact info under Cardiology/STEMI.

## 2023-05-20 NOTE — Assessment & Plan Note (Signed)
 Patient apparently have intolerance to statin, he was on Repatha and unable to take it now due to financial difficulties. -Continue with Zetia -Outpatient pharmacy help to resume Repatha or explore other options

## 2023-05-20 NOTE — Consult Note (Signed)
 Pharmacy Consult Note - Anticoagulation  Pharmacy Consult for heparin Indication: chest pain/ACS  PATIENT MEASUREMENTS: Height: 6\' 3"  (190.5 cm) Weight: (!) 172.4 kg (380 lb) IBW/kg (Calculated) : 84.5 HEPARIN DW (KG): 125.6  VITAL SIGNS: Temp: 97.5 F (36.4 C) (03/08 1327) Temp Source: Oral (03/08 1327) BP: 119/84 (03/08 1200) Pulse Rate: 85 (03/08 1100)  Recent Labs    05/19/23 1258 05/19/23 1349 05/19/23 2153 05/20/23 0506 05/20/23 1325  HGB  --   --   --  12.7*  --   HCT  --   --   --  39.4  --   PLT  --   --   --  206  --   APTT 30  --   --   --   --   LABPROT 15.6*  --   --   --   --   INR 1.2  --   --   --   --   HEPARINUNFRC <0.10*  --    < > 0.27* 0.42  CREATININE  --   --   --  1.09  --   TROPONINIHS  --  573*  --   --   --    < > = values in this interval not displayed.   Estimated Creatinine Clearance: 141.8 mL/min (by C-G formula based on SCr of 1.09 mg/dL).  PAST MEDICAL HISTORY: Past Medical History:  Diagnosis Date   Aortic root dilatation (HCC)    a. 07/2022 Echo: Ao root 4.4cm, Asc Ao 4.0cm.   CAD (coronary artery disease)    a. 08/2017 Cath: LM nl, LAD 56m/d, D1 70ost, LCX 113m, OM1 90 inf branch, 60 sup branch, OM2 small, RCA 30 diffuse, RPDA 100 w/ L->R collats; b. 07/2022 Echo: LM nl, LAD 29m, D1 80, LCX 144m, OM1 80, RCA 76m, RPDA 100-->No targets for PCI/CABG-->Med rx.   Chronic HFrEF (heart failure with reduced ejection fraction) (HCC)    a. 03/2015 Echo: EF 35-40%; b. 08/2017 Echo: EF 15%; c. 08/2017 cMRI: EF 35%; d. 06/2018 Echo: EF 10-15%; e. 07/2022 Echo: EF 15-20%, grIII DD, mild MR, sev dil LA, mod-sev PH, Ao root 4.4cm, Asc Ao 4.0cm; f. 07/2022 RHC: RA 4, RV 38/3, PA 43/18 (26), PCWP 11. CO/CI 5.3/1.8.   CKD (chronic kidney disease), stage II    Gout    Hyperlipidemia LDL goal <70    Hypertension    Ischemic cardiomyopathy    a. 03/2015 Echo: EF 35-40%; b. 08/2017 Echo: EF 15%; c. 08/2017 cMRI: EF 35%; d. 06/2018 Echo: EF 10-15%; e. 07/2022 Echo:  EF 15-20%, grIII DD.   IVCD (intraventricular conduction defect)    Left ventricular noncompaction [facility])    Morbid obesity (HCC)    NSVT (nonsustained ventricular tachycardia) (HCC)    a. 09/2022 Event monitor: predominantly sinus rhythm, avg HR 81 (46-167). 17 runs NSVT (fastest/longest 13 beats x 167). Rare PACs, 17.5% PVC burden.   PVC's (premature ventricular contractions)    a. 09/2022 Event Monitor: 17.5% PVC burden.   Statin intolerance    Tobacco abuse    ASSESSMENT: 48 y.o. male with PMH including HFrEF (07/2022 EF 15-20%), obesity, evere multivessel CAD medically managed, HTN, frequent PVCs  is presenting with chest pain and shortness of breath. Patient endorses having missed many days of both furosemide and Eliquis due to worsening SOB preventing him from reaching medicine. Last dose of Eliquis estimated on 05/08/2023. cTn are trending up and ECG exhibits sinus rhythm but with multiple, frequent PVCs.  Patient is on chronic anticoagulation with Eliquis per chart review, which was started in 03/2023 due to frequent PVCs. Pharmacy has been consulted to initiate and manage heparin intravenous infusion.  Pertinent medications: Eliquis prior to admission Last dose 05/08/2023 per patient estimate, as he could not reach medication due to too much dyspnea  Goal(s) of therapy: Heparin level 0.3 - 0.7 units/mL aPTT 66 - 102 seconds Monitor platelets by anticoagulation protocol: Yes   Baseline anticoagulation labs: Recent Labs    05/19/23 1146 05/19/23 1258 05/20/23 0506  APTT  --  30  --   INR  --  1.2  --   HGB 13.6  --  12.7*  PLT 228  --  206    Date Time aPTT/HL Rate/Comment 3/07 2153 HL 0.22 Subtherapeutic 3/08 0506 HL 0.27 Subtherapeutic 3/08    1325    HL 0.42           Therapeutic   Plan:  HL therapeutic x 1  Continue heparin infusion at 2250 units/hr  Check next HL in 6 hours at 1930 to confirm rate  Monitor CBC daily and for s/sx of bleeding   Littie Deeds,  PharmD Pharmacy Resident  05/20/2023 2:13 PM

## 2023-05-21 DIAGNOSIS — I509 Heart failure, unspecified: Secondary | ICD-10-CM

## 2023-05-21 DIAGNOSIS — I255 Ischemic cardiomyopathy: Secondary | ICD-10-CM

## 2023-05-21 DIAGNOSIS — R7989 Other specified abnormal findings of blood chemistry: Secondary | ICD-10-CM

## 2023-05-21 LAB — BASIC METABOLIC PANEL
Anion gap: 9 (ref 5–15)
BUN: 26 mg/dL — ABNORMAL HIGH (ref 6–20)
CO2: 24 mmol/L (ref 22–32)
Calcium: 8.6 mg/dL — ABNORMAL LOW (ref 8.9–10.3)
Chloride: 100 mmol/L (ref 98–111)
Creatinine, Ser: 1.13 mg/dL (ref 0.61–1.24)
GFR, Estimated: >60 mL/min (ref >60)
Glucose, Bld: 89 mg/dL (ref 70–99)
Potassium: 3.4 mmol/L — ABNORMAL LOW (ref 3.5–5.1)
Sodium: 133 mmol/L — ABNORMAL LOW (ref 135–145)

## 2023-05-21 LAB — CBC
HCT: 41.7 % (ref 39.0–52.0)
Hemoglobin: 13.1 g/dL (ref 13.0–17.0)
MCH: 25.9 pg — ABNORMAL LOW (ref 26.0–34.0)
MCHC: 31.4 g/dL (ref 30.0–36.0)
MCV: 82.6 fL (ref 80.0–100.0)
Platelets: 218 10*3/uL (ref 150–400)
RBC: 5.05 MIL/uL (ref 4.22–5.81)
RDW: 16.7 % — ABNORMAL HIGH (ref 11.5–15.5)
WBC: 7.8 10*3/uL (ref 4.0–10.5)
nRBC: 0 % (ref 0.0–0.2)

## 2023-05-21 LAB — HEPARIN LEVEL (UNFRACTIONATED)
Heparin Unfractionated: 0.25 [IU]/mL — ABNORMAL LOW (ref 0.30–0.70)
Heparin Unfractionated: 0.59 [IU]/mL (ref 0.30–0.70)
Heparin Unfractionated: 0.61 [IU]/mL (ref 0.30–0.70)

## 2023-05-21 LAB — HIV ANTIBODY (ROUTINE TESTING W REFLEX): HIV Screen 4th Generation wRfx: NONREACTIVE

## 2023-05-21 MED ORDER — HEPARIN BOLUS VIA INFUSION
1900.0000 [IU] | Freq: Once | INTRAVENOUS | Status: AC
  Administered 2023-05-21: 1900 [IU] via INTRAVENOUS
  Filled 2023-05-21: qty 1900

## 2023-05-21 MED ORDER — AMIODARONE HCL 200 MG PO TABS
200.0000 mg | ORAL_TABLET | Freq: Two times a day (BID) | ORAL | Status: DC
  Administered 2023-05-21 – 2023-05-22 (×3): 200 mg via ORAL
  Filled 2023-05-21 (×3): qty 1

## 2023-05-21 MED ORDER — FUROSEMIDE 10 MG/ML IJ SOLN
80.0000 mg | Freq: Three times a day (TID) | INTRAMUSCULAR | Status: DC
  Administered 2023-05-21 – 2023-05-23 (×6): 80 mg via INTRAVENOUS
  Filled 2023-05-21 (×6): qty 8

## 2023-05-21 MED ORDER — IPRATROPIUM-ALBUTEROL 0.5-2.5 (3) MG/3ML IN SOLN: 3.0000 mL | Freq: Four times a day (QID) | RESPIRATORY_TRACT | Status: DC | PRN

## 2023-05-21 MED ORDER — POTASSIUM CHLORIDE CRYS ER 20 MEQ PO TBCR
40.0000 meq | EXTENDED_RELEASE_TABLET | Freq: Once | ORAL | Status: AC
  Administered 2023-05-21: 40 meq via ORAL
  Filled 2023-05-21: qty 2

## 2023-05-21 NOTE — Assessment & Plan Note (Signed)
 Troponin mildly elevated with a flat curve.  Likely demand ischemia with history of significant CAD.  No chest pain now Cardiac cath from last year with severe multivessel disease including 80% mid RCA, 100% RPDA, 100% mid LCx, 80% OM1, 80% mid-distal LAD, with no ideal targets for PCI or surgical revascularization with concomitant non-compaction cardiomyopathy.  Medical management was suggested. History of multiple PVCs, his home Coreg was apparently discontinued for concern of bradycardia last year. Cardiology restarted home Coreg -Continue with heparin infusion for 48 hours -Continue aspirin, Zetia, nitrate and beta-blocker -Cardiology is on board

## 2023-05-21 NOTE — Plan of Care (Signed)
  Problem: Education: Goal: Knowledge of General Education information will improve Description: Including pain rating scale, medication(s)/side effects and non-pharmacologic comfort measures Outcome: Progressing   Problem: Clinical Measurements: Goal: Diagnostic test results will improve Outcome: Progressing   Problem: Clinical Measurements: Goal: Cardiovascular complication will be avoided Outcome: Progressing   Problem: Coping: Goal: Level of anxiety will decrease Outcome: Progressing   

## 2023-05-21 NOTE — Assessment & Plan Note (Signed)
 History of EF of 15 to 20% with grade 2 diastolic dysfunction, moderate to severe pulmonary hypertension.  Multiple hospitalization due to acute exacerbation.  Patient ran out of his medications due to financial reason and change in insurance. Repeat echocardiogram with less than 20% EF, severely dilated chambers and indeterminate diastolic function. -Continue with IV diuresis-dose increased to every 8 hourly -Metolazone was added today by heart failure team -Daily weight and BMP -Strict intake and output -Coreg switched with metoprolol -Losartan switched with Entresto -Spironolactone was added -Cardiology was also consulted -Restarted home Eliquis

## 2023-05-21 NOTE — Consult Note (Signed)
 PHARMACY - ANTICOAGULATION CONSULT NOTE  Pharmacy Consult for Heparin Infusion Indication: chest pain/ACS  Allergies  Allergen Reactions   Lactose Intolerance (Gi) Diarrhea   Statins     myalgias   Patient Measurements: Height: 6\' 3"  (190.5 cm) Weight: (!) 167.8 kg (369 lb 14.4 oz) IBW/kg (Calculated) : 84.5 Heparin Dosing Weight: 125.6 kg  Vital Signs: Temp: 97.7 F (36.5 C) (03/09 0913) Temp Source: Oral (03/09 0913) BP: 118/97 (03/09 0913) Pulse Rate: 63 (03/09 0913)  Labs: Recent Labs    05/19/23 1146 05/19/23 1258 05/19/23 1349 05/19/23 2153 05/20/23 0506 05/20/23 1325 05/20/23 1927 05/21/23 0453 05/21/23 1219  HGB 13.6  --   --   --  12.7*  --   --  13.1  --   HCT 42.8  --   --   --  39.4  --   --  41.7  --   PLT 228  --   --   --  206  --   --  218  --   APTT  --  30  --   --   --   --   --   --   --   LABPROT  --  15.6*  --   --   --   --   --   --   --   INR  --  1.2  --   --   --   --   --   --   --   HEPARINUNFRC  --  <0.10*  --    < > 0.27*   < > 0.44 0.25* 0.59  CREATININE 1.06  --   --   --  1.09  --   --  1.13  --   TROPONINIHS 571*  --  573*  --   --   --   --   --   --    < > = values in this interval not displayed.   Estimated Creatinine Clearance: 134.7 mL/min (by C-G formula based on SCr of 1.13 mg/dL).  Medical History: Past Medical History:  Diagnosis Date   Aortic root dilatation (HCC)    a. 07/2022 Echo: Ao root 4.4cm, Asc Ao 4.0cm.   CAD (coronary artery disease)    a. 08/2017 Cath: LM nl, LAD 29m/d, D1 70ost, LCX 144m, OM1 90 inf branch, 60 sup branch, OM2 small, RCA 30 diffuse, RPDA 100 w/ L->R collats; b. 07/2022 Echo: LM nl, LAD 51m, D1 80, LCX 130m, OM1 80, RCA 81m, RPDA 100-->No targets for PCI/CABG-->Med rx.   Chronic HFrEF (heart failure with reduced ejection fraction) (HCC)    a. 03/2015 Echo: EF 35-40%; b. 08/2017 Echo: EF 15%; c. 08/2017 cMRI: EF 35%; d. 06/2018 Echo: EF 10-15%; e. 07/2022 Echo: EF 15-20%, grIII DD, mild MR, sev dil  LA, mod-sev PH, Ao root 4.4cm, Asc Ao 4.0cm; f. 07/2022 RHC: RA 4, RV 38/3, PA 43/18 (26), PCWP 11. CO/CI 5.3/1.8.   CKD (chronic kidney disease), stage II    Gout    Hyperlipidemia LDL goal <70    Hypertension    Ischemic cardiomyopathy    a. 03/2015 Echo: EF 35-40%; b. 08/2017 Echo: EF 15%; c. 08/2017 cMRI: EF 35%; d. 06/2018 Echo: EF 10-15%; e. 07/2022 Echo: EF 15-20%, grIII DD.   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 09/2022 Event monitor:  predominantly sinus rhythm, avg HR 81 (46-167). 17 runs NSVT (fastest/longest 13 beats x 167). Rare PACs, 17.5% PVC burden.   PVC's (premature ventricular contractions)    a. 09/2022 Event Monitor: 17.5% PVC burden.   Statin intolerance    Tobacco abuse    Assessment: Philip Monroe is a 48 y.o. male presenting with chest pain and SOB. PMH significant for HFrEF (07/2022 EF 15-20%), obesity, severe multivessel CAD, HTN, frequent PVCs . Patient was on [facility] PTA per chart review.  Patient endorses having missed many days of both furosemide and Eliquis due to worsening SOB preventing him from reaching medicine. Last dose of Eliquis estimated on 05/08/2023. cTn are trending up and ECG exhibits sinus rhythm but with multiple, frequent PVCs. Patient is on chronic anticoagulation with Eliquis per chart review, which was started in 03/2023 due to frequent PVCs. Pharmacy has been consulted to initiate and manage heparin infusion.   Baseline Labs: aPTT 30, HL <0.10, PT 15.6, INR 1.2, Hgb 13.6, Hct 42.8, Plt 228   Goal of Therapy:  Heparin level 0.3-0.7 units/ml aPTT 66-102 seconds Monitor platelets by anticoagulation protocol: Yes   Date Time HL Rate/Comment  3/7 2153 0.22 1750/subtherapeutic 3/8 0506 0.27 2000/subtherapeutic 3/8 1325 0.42 2250/therapeutic x1 3/8 1927 0.44 2250/therapeutic x2 3/9 0453 0.25 2250/subtherapeutic  3/9       1219     0.59    2500/therapeutic x1  Plan:  HL therapeutic x 1 Continue heparin infusion at 2500 units/hr Recheck HL in 6 hrs to confirm rate  Continue to monitor H&H and platelets daily while on heparin infusion   Littie Deeds, PharmD Pharmacy Resident  05/21/2023 1:09 PM

## 2023-05-21 NOTE — Progress Notes (Signed)
 Progress Note   [name] BJY:[id number] DOB: Nov 07, 1975 DOA: 05/19/2023     2 DOS: the patient was seen and examined on 05/21/2023   Brief hospital course: Taken from H&P.   CHRISTINE SCHIEFELBEIN is a 48 y.o. male with medical history significant of multivessel CAD, chronic HFrEF with LVEF 15-20 % 2024, ischemic cardiomyopathy, HTN, chronic anticoagulation secondary to CAD and ischemic cardiomyopathy, morbid obesity, presented with worsening of cough shortness of breath and chest pain for about a week.  Patient ran out of his CHF medications including spironolactone, hydralazine, Isordil 2 to 3 weeks ago due to loss of insurance coverage.   On presentation hemodynamically stable on room air, chest x-ray with pulmonary congestion.  Labs seems stable, troponin 571, EKG with NSR and frequent PVCs.  Patient was started on heparin infusion and IV diuresis.  3/8: Vital stable, troponin with a flat trend, BNP 1548, echocardiogram with EF of less than 20% with severely dilated chambers.  Mild to moderate MR. Advanced heart failure team was also consulted.  3/9: Vital stable, UOP was not recorded.  IV Lasix was increased to Q8 hourly by cardiology.  Might need milrinone with Lasix infusion if UOP remains low.  Told nurse again for strict intake and output  Assessment and Plan: * Acute on chronic combined systolic and diastolic CHF (congestive heart failure) (HCC) History of EF of 15 to 20% with grade 2 diastolic dysfunction, moderate to severe pulmonary hypertension.  Multiple hospitalization due to acute exacerbation.  Patient ran out of his medications due to financial reason and change in insurance. Repeat echocardiogram with less than 20% EF, severely dilated chambers and indeterminate diastolic function. -Continue with IV diuresis-dose increased to every 8 hourly -Daily weight and BMP -Strict intake and output -Continue with Coreg, losartan. -Spironolactone was added -Cardiology was  also consulted -Patient was also on Eliquis at home for concern of preventing apical thrombus due to severely dilated cardiomyopathy.  NSTEMI (non-ST elevated myocardial infarction) (HCC) Troponin mildly elevated with a flat curve.  Likely demand ischemia with history of significant CAD.  No chest pain now Cardiac cath from last year with severe multivessel disease including 80% mid RCA, 100% RPDA, 100% mid LCx, 80% OM1, 80% mid-distal LAD, with no ideal targets for PCI or surgical revascularization with concomitant non-compaction cardiomyopathy.  Medical management was suggested. History of multiple PVCs, his home Coreg was apparently discontinued for concern of bradycardia last year. Cardiology restarted home Coreg -Continue with heparin infusion for 48 hours -Continue aspirin, Zetia, nitrate and beta-blocker -Cardiology is on board  PVC's (premature ventricular contractions) Frequent PVCs noted on monitor.  Patient has severely dilated ischemic cardiomyopathy. -Home Coreg was restarted -Monitor electrolyte closely to keep the potassium more than 4 and magnesium more than 2. -Patient likely need evaluation for AICD  Hypertension Blood pressure currently within goal -Continue with current medications  Hyperlipidemia LDL goal <70 Patient apparently have intolerance to statin, he was on Repatha and unable to take it now due to financial difficulties. -Continue with Zetia -Outpatient pharmacy help to resume Repatha or explore other options  CKD (chronic kidney disease), stage II Renal function currently seems stable. -Monitor renal function closely while patient is being diuresed -Avoid nephrotoxins  Morbid obesity (HCC) Estimated body mass index is 47.5 kg/m as calculated from the following:   Height as of this encounter: 6\' 3"  (1.905 m).   Weight as of this encounter: 172.4 kg.   -This will complicate overall prognosis -Encouraged weight  loss   Subjective: Patient was  sitting in chair when seen today.  Still quite short of breath.  Advised patient to save all the urine for measurement.  Physical Exam: Vitals:   05/21/23 0355 05/21/23 0500 05/21/23 0913 05/21/23 1220  BP: 116/87  (!) 118/97 101/85  Pulse: 70  63 60  Resp: 18  18 18   Temp: 97.6 F (36.4 C)  97.7 F (36.5 C) 97.7 F (36.5 C)  TempSrc: Oral  Oral Oral  SpO2: 96%  99% 99%  Weight:  (!) 167.8 kg    Height:       General.  Morbidly obese gentleman, in no acute distress. Pulmonary.  Lungs clear bilaterally, normal respiratory effort. CV.  Regular rate and rhythm, no JVD, rub or murmur. Abdomen.  Soft, nontender, nondistended, BS positive. CNS.  Alert and oriented .  No focal neurologic deficit. Extremities.  Trace LE edema, no cyanosis, pulses intact and symmetrical. Psychiatry.  Judgment and insight appears normal.   Data Reviewed: Prior data reviewed  Family Communication: Discussed with patient  Disposition: Status is: Inpatient Remains inpatient appropriate because: Severity of illness  Planned Discharge Destination: Home  DVT prophylaxis.  Heparin infusion Time spent: 50 minutes  This record has been created using Conservation officer, historic buildings. Errors have been sought and corrected,but may not always be located. Such creation errors do not reflect on the standard of care.   Author: Arnetha Courser, MD 05/21/2023 3:22 PM  For on call review [email or web address].

## 2023-05-21 NOTE — Plan of Care (Signed)

## 2023-05-21 NOTE — Consult Note (Signed)
 PHARMACY - ANTICOAGULATION CONSULT NOTE  Pharmacy Consult for Heparin Infusion Indication: chest pain/ACS  Allergies  Allergen Reactions   Lactose Intolerance (Gi) Diarrhea   Statins     myalgias    Patient Measurements: Height: 6\' 3"  (190.5 cm) Weight: (!) 167.8 kg (369 lb 14.4 oz) IBW/kg (Calculated) : 84.5 Heparin Dosing Weight: 125.6 kg  Vital Signs: Temp: 97.6 F (36.4 C) (03/09 0355) Temp Source: Oral (03/09 0355) BP: 116/87 (03/09 0355) Pulse Rate: 70 (03/09 0355)  Labs: Recent Labs    05/19/23 1146 05/19/23 1258 05/19/23 1349 05/19/23 2153 05/20/23 0506 05/20/23 1325 05/20/23 1927 05/21/23 0453  HGB 13.6  --   --   --  12.7*  --   --  13.1  HCT 42.8  --   --   --  39.4  --   --  41.7  PLT 228  --   --   --  206  --   --  218  APTT  --  30  --   --   --   --   --   --   LABPROT  --  15.6*  --   --   --   --   --   --   INR  --  1.2  --   --   --   --   --   --   HEPARINUNFRC  --  <0.10*  --    < > 0.27* 0.42 0.44 0.25*  CREATININE 1.06  --   --   --  1.09  --   --   --   TROPONINIHS 571*  --  573*  --   --   --   --   --    < > = values in this interval not displayed.    Estimated Creatinine Clearance: 139.6 mL/min (by C-G formula based on SCr of 1.09 mg/dL).   Medical History: Past Medical History:  Diagnosis Date   Aortic root dilatation (HCC)    a. 07/2022 Echo: Ao root 4.4cm, Asc Ao 4.0cm.   CAD (coronary artery disease)    a. 08/2017 Cath: LM nl, LAD 42m/d, D1 70ost, LCX 150m, OM1 90 inf branch, 60 sup branch, OM2 small, RCA 30 diffuse, RPDA 100 w/ L->R collats; b. 07/2022 Echo: LM nl, LAD 45m, D1 80, LCX 110m, OM1 80, RCA 36m, RPDA 100-->No targets for PCI/CABG-->Med rx.   Chronic HFrEF (heart failure with reduced ejection fraction) (HCC)    a. 03/2015 Echo: EF 35-40%; b. 08/2017 Echo: EF 15%; c. 08/2017 cMRI: EF 35%; d. 06/2018 Echo: EF 10-15%; e. 07/2022 Echo: EF 15-20%, grIII DD, mild MR, sev dil LA, mod-sev PH, Ao root 4.4cm, Asc Ao 4.0cm; f. 07/2022  RHC: RA 4, RV 38/3, PA 43/18 (26), PCWP 11. CO/CI 5.3/1.8.   CKD (chronic kidney disease), stage II    Gout    Hyperlipidemia LDL goal <70    Hypertension    Ischemic cardiomyopathy    a. 03/2015 Echo: EF 35-40%; b. 08/2017 Echo: EF 15%; c. 08/2017 cMRI: EF 35%; d. 06/2018 Echo: EF 10-15%; e. 07/2022 Echo: EF 15-20%, grIII DD.   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 09/2022 Event monitor: predominantly sinus rhythm, avg HR 81 (46-167). 17 runs NSVT (fastest/longest 13 beats x 167). Rare PACs, 17.5% PVC burden.   PVC's (premature ventricular  contractions)    a. 09/2022 Event Monitor: 17.5% PVC burden.   Statin intolerance    Tobacco abuse     Assessment: Philip Monroe is a 48 y.o. male presenting with chest pain and SOB. PMH significant for HFrEF (07/2022 EF 15-20%), obesity, severe multivessel CAD, HTN, frequent PVCs . Patient was on [facility] PTA per chart review.  Patient endorses having missed many days of both furosemide and Eliquis due to worsening SOB preventing him from reaching medicine. Last dose of Eliquis estimated on 05/08/2023. cTn are trending up and ECG exhibits sinus rhythm but with multiple, frequent PVCs. Patient is on chronic anticoagulation with Eliquis per chart review, which was started in 03/2023 due to frequent PVCs. Pharmacy has been consulted to initiate and manage heparin infusion.   Baseline Labs: aPTT 30, HL <0.10, PT 15.6, INR 1.2, Hgb 13.6, Hct 42.8, Plt 228   Goal of Therapy:  Heparin level 0.3-0.7 units/ml aPTT 66-102 seconds Monitor platelets by anticoagulation protocol: Yes   Date Time HL Rate/Comment  3/7 2153 0.22 1750/subtherapeutic 3/8 0506 0.27 2000/subtherapeutic 3/8 1325 0.42 2250/therapeutic x1 3/8 1927 0.44 2250/therapeutic x2 3/9 0453 0.25 2250/subtherapeutic   Plan:  Bolus 1900 units x 1 Increase heparin infusion to 2500  units/hr Recheck HL in 6 hrs after rate change Continue to monitor H&H and platelets daily while on heparin infusion   Otelia Sergeant, PharmD, [facility] 05/21/2023 6:10 AM

## 2023-05-21 NOTE — Consult Note (Signed)
 PHARMACY - ANTICOAGULATION CONSULT NOTE  Pharmacy Consult for Heparin Infusion Indication: chest pain/ACS  Allergies  Allergen Reactions   Lactose Intolerance (Gi) Diarrhea   Statins     myalgias   Patient Measurements: Height: 6\' 3"  (190.5 cm) Weight: (!) 167.8 kg (369 lb 14.4 oz) IBW/kg (Calculated) : 84.5 Heparin Dosing Weight: 125.6 kg  Vital Signs: Temp: 97.8 F (36.6 C) (03/09 1605) Temp Source: Oral (03/09 1605) BP: 127/62 (03/09 1605) Pulse Rate: 60 (03/09 1220)  Labs: Recent Labs    05/19/23 1146 05/19/23 1258 05/19/23 1349 05/19/23 2153 05/20/23 0506 05/20/23 1325 05/21/23 0453 05/21/23 1219 05/21/23 1804  HGB 13.6  --   --   --  12.7*  --  13.1  --   --   HCT 42.8  --   --   --  39.4  --  41.7  --   --   PLT 228  --   --   --  206  --  218  --   --   APTT  --  30  --   --   --   --   --   --   --   LABPROT  --  15.6*  --   --   --   --   --   --   --   INR  --  1.2  --   --   --   --   --   --   --   HEPARINUNFRC  --  <0.10*  --    < > 0.27*   < > 0.25* 0.59 0.61  CREATININE 1.06  --   --   --  1.09  --  1.13  --   --   TROPONINIHS 571*  --  573*  --   --   --   --   --   --    < > = values in this interval not displayed.   Estimated Creatinine Clearance: 134.7 mL/min (by C-G formula based on SCr of 1.13 mg/dL).  Medical History: Past Medical History:  Diagnosis Date   Aortic root dilatation (HCC)    a. 07/2022 Echo: Ao root 4.4cm, Asc Ao 4.0cm.   CAD (coronary artery disease)    a. 08/2017 Cath: LM nl, LAD 78m/d, D1 70ost, LCX 154m, OM1 90 inf branch, 60 sup branch, OM2 small, RCA 30 diffuse, RPDA 100 w/ L->R collats; b. 07/2022 Echo: LM nl, LAD 17m, D1 80, LCX 189m, OM1 80, RCA 35m, RPDA 100-->No targets for PCI/CABG-->Med rx.   Chronic HFrEF (heart failure with reduced ejection fraction) (HCC)    a. 03/2015 Echo: EF 35-40%; b. 08/2017 Echo: EF 15%; c. 08/2017 cMRI: EF 35%; d. 06/2018 Echo: EF 10-15%; e. 07/2022 Echo: EF 15-20%, grIII DD, mild MR, sev dil  LA, mod-sev PH, Ao root 4.4cm, Asc Ao 4.0cm; f. 07/2022 RHC: RA 4, RV 38/3, PA 43/18 (26), PCWP 11. CO/CI 5.3/1.8.   CKD (chronic kidney disease), stage II    Gout    Hyperlipidemia LDL goal <70    Hypertension    Ischemic cardiomyopathy    a. 03/2015 Echo: EF 35-40%; b. 08/2017 Echo: EF 15%; c. 08/2017 cMRI: EF 35%; d. 06/2018 Echo: EF 10-15%; e. 07/2022 Echo: EF 15-20%, grIII DD.   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 09/2022 Event monitor:  predominantly sinus rhythm, avg HR 81 (46-167). 17 runs NSVT (fastest/longest 13 beats x 167). Rare PACs, 17.5% PVC burden.   PVC's (premature ventricular contractions)    a. 09/2022 Event Monitor: 17.5% PVC burden.   Statin intolerance    Tobacco abuse    Assessment: Philip Monroe is a 48 y.o. male presenting with chest pain and SOB. PMH significant for HFrEF (07/2022 EF 15-20%), obesity, severe multivessel CAD, HTN, frequent PVCs . Patient was on [facility] PTA per chart review.  Patient endorses having missed many days of both furosemide and Eliquis due to worsening SOB preventing him from reaching medicine. Last dose of Eliquis estimated on 05/08/2023. cTn are trending up and ECG exhibits sinus rhythm but with multiple, frequent PVCs. Patient is on chronic anticoagulation with Eliquis per chart review, which was started in 03/2023 due to frequent PVCs. Pharmacy has been consulted to initiate and manage heparin infusion.   Baseline Labs: aPTT 30, HL <0.10, PT 15.6, INR 1.2, Hgb 13.6, Hct 42.8, Plt 228   Goal of Therapy:  Heparin level 0.3-0.7 units/ml aPTT 66-102 seconds Monitor platelets by anticoagulation protocol: Yes   Date Time HL Rate/Comment  3/7 2153 0.22 1750/subtherapeutic 3/8 0506 0.27 2000/subtherapeutic 3/8 1325 0.42 2250/therapeutic x1 3/8 1927 0.44 2250/therapeutic x2 3/9 0453 0.25 2250/subtherapeutic  3/9       1219     0.59    2500/therapeutic x1 3/9 1804 0.61 2500/therapeutic x2  Plan:  HL therapeutic x2 Continue heparin infusion at 2500 units/hr Check HL daily while on heparin infusion Continue to monitor H&H and platelets daily while on heparin infusion   Celene Squibb, PharmD Clinical Pharmacist 05/21/2023 6:36 PM

## 2023-05-21 NOTE — Progress Notes (Signed)
 Rounding Note    Patient Name: Philip Monroe Date of Encounter: 05/21/2023  [facility] Cardiologist: new to [facility]  Subjective   On rounds sitting in recliner Reports he was awake early, did not sleep well sleeping on his sides, typically stomach sleeper Still with shortness of breath Inaccurate ins and outs while he was in the ER Received several doses IV Lasix yesterday, slight improvement in breathing Echocardiogram reviewed, EF less than 20%, dilated LV with global hypokinesis Remains on heparin infusion  Inpatient Medications    Scheduled Meds:  aspirin  325 mg Oral Daily   carvedilol  3.125 mg Oral BID WC   ezetimibe  10 mg Oral QHS   furosemide  80 mg Intravenous Q8H   guaiFENesin  1,200 mg Oral BID   hydrALAZINE  10 mg Oral TID   isosorbide dinitrate  10 mg Oral TID   losartan  100 mg Oral Daily   sodium chloride flush  3 mL Intravenous Q12H   spironolactone  25 mg Oral Daily   Continuous Infusions:  heparin 2,500 Units/hr (05/21/23 0623)   PRN Meds: acetaminophen, ipratropium-albuterol, ondansetron (ZOFRAN) IV, sodium chloride flush   Vital Signs    Vitals:   05/21/23 0049 05/21/23 0355 05/21/23 0500 05/21/23 0913  BP: 126/78 116/87  (!) 118/97  Pulse: 72 70  63  Resp: 18 18  18   Temp: 97.7 F (36.5 C) 97.6 F (36.4 C)  97.7 F (36.5 C)  TempSrc: Oral Oral  Oral  SpO2: 98% 96%  99%  Weight:   (!) 167.8 kg   Height:        Intake/Output Summary (Last 24 hours) at 05/21/2023 1139 Last data filed at 05/21/2023 0900 Gross per 24 hour  Intake 1023.06 ml  Output --  Net 1023.06 ml      05/21/2023    5:00 AM 05/19/2023   11:44 AM 07/24/2017   11:02 AM  Last 3 Weights  Weight (lbs) 369 lb 14.4 oz 380 lb 350 lb  Weight (kg) 167.786 kg 172.367 kg 158.759 kg      Telemetry    Normal sinus rhythm frequent PVCs- Personally Reviewed  ECG     - Personally Reviewed  Physical Exam   GEN: No acute distress.   Neck: Unable to estimate  JVD Cardiac: RRR, no murmurs, rubs, or gallops.  Respiratory: Clear to auscultation bilaterally, scattered Rales GI: Soft, nontender, non-distended  MS: No edema; No deformity. Neuro:  Nonfocal  Psych: Normal affect   Labs    High Sensitivity Troponin:   Recent Labs  Lab 05/19/23 1146 05/19/23 1349  TROPONINIHS 571* 573*     Chemistry Recent Labs  Lab 05/19/23 1146 05/20/23 0506 05/21/23 0453  NA 134* 135 133*  K 3.7 3.7 3.4*  CL 103 101 100  CO2 21* 21* 24  GLUCOSE 114* 102* 89  BUN 21* 23* 26*  CREATININE 1.06 1.09 1.13  CALCIUM 8.9 8.7* 8.6*  MG 1.9  --   --   PROT 7.4  --   --   ALBUMIN 3.5  --   --   AST 20  --   --   ALT 20  --   --   ALKPHOS 54  --   --   BILITOT 1.8*  --   --   GFRNONAA >60 >60 >60  ANIONGAP 10 13 9     Lipids No results for input(s): "CHOL", "TRIG", "HDL", "LABVLDL", "LDLCALC", "CHOLHDL" in the last 168 hours.  Hematology Recent Labs  Lab 05/19/23 1146 05/20/23 0506 05/21/23 0453  WBC 7.0 6.9 7.8  RBC 5.21 4.81 5.05  HGB 13.6 12.7* 13.1  HCT 42.8 39.4 41.7  MCV 82.1 81.9 82.6  MCH 26.1 26.4 25.9*  MCHC 31.8 32.2 31.4  RDW 16.9* 16.8* 16.7*  PLT 228 206 218   Thyroid  Recent Labs  Lab 05/19/23 1349  TSH 1.160    BNP Recent Labs  Lab 05/19/23 1146  BNP 1,548.3*    DDimer No results for input(s): "DDIMER" in the last 168 hours.   Radiology    ECHOCARDIOGRAM COMPLETE Result Date: 05/20/2023    ECHOCARDIOGRAM REPORT   Patient Name:   Philip Monroe Date of Exam: 05/20/2023 Medical Rec #:  [id number]      Height:       75.0 in Accession #:    [id number]     Weight:       380.0 lb Date of Birth:  Jun 08, 1975      BSA:          2.883 m Patient Age:    48 years       BP:           128/80 mmHg Patient Gender: M              HR:           66 bpm. Exam Location:  [facility] Procedure: 2D Echo, Cardiac Doppler, Color Doppler and Intracardiac            Opacification Agent (Both Spectral and Color Flow Doppler were            utilized during  procedure). Indications:     CHF I50.21  History:         Patient has no prior history of Echocardiogram examinations.  Sonographer:     Overton Mam RDCS, FASE Referring Phys:  [id number] Antonieta Iba Diagnosing Phys: Julien Nordmann MD  Sonographer Comments: Technically difficult study due to poor echo windows and patient is obese. Image acquisition challenging due to patient body habitus and Image acquisition challenging due to respiratory motion. IMPRESSIONS  1. Left ventricular ejection fraction, by estimation, is <20%. Left ventricular ejection fraction by 2D MOD biplane is 19.2 %. The left ventricle has severely decreased function. The left ventricle demonstrates global hypokinesis. The left ventricular internal cavity size was severely dilated. Left ventricular diastolic parameters are indeterminate.  2. Right ventricular systolic function is moderately reduced. The right ventricular size is normal.  3. Left atrial size was severely dilated.  4. The mitral valve is normal in structure. Mild to moderate mitral valve regurgitation. No evidence of mitral stenosis.  5. The aortic valve is normal in structure. Aortic valve regurgitation is not visualized. No aortic stenosis is present.  6. The inferior vena cava is normal in size with greater than 50% respiratory variability, suggesting right atrial pressure of 3 mmHg. FINDINGS  Left Ventricle: Left ventricular ejection fraction, by estimation, is <20%. Left ventricular ejection fraction by 2D MOD biplane is 19.2 %. The left ventricle has severely decreased function. The left ventricle demonstrates global hypokinesis. Definity contrast agent was given IV to delineate the left ventricular endocardial borders. Global longitudinal strain performed but not reported based on interpreter judgement due to suboptimal tracking. The left ventricular internal cavity size was severely dilated. There is no left ventricular hypertrophy. Left ventricular diastolic parameters  are indeterminate. Right Ventricle: The right ventricular size is normal. No increase in right  ventricular wall thickness. Right ventricular systolic function is moderately reduced. Left Atrium: Left atrial size was severely dilated. Right Atrium: Right atrial size was normal in size. Pericardium: There is no evidence of pericardial effusion. Mitral Valve: The mitral valve is normal in structure. Mild to moderate mitral valve regurgitation. No evidence of mitral valve stenosis. Tricuspid Valve: The tricuspid valve is normal in structure. Tricuspid valve regurgitation is not demonstrated. No evidence of tricuspid stenosis. Aortic Valve: The aortic valve is normal in structure. Aortic valve regurgitation is not visualized. No aortic stenosis is present. Aortic valve peak gradient measures 8.5 mmHg. Pulmonic Valve: The pulmonic valve was normal in structure. Pulmonic valve regurgitation is not visualized. No evidence of pulmonic stenosis. Aorta: The aortic root is normal in size and structure. Venous: The inferior vena cava is normal in size with greater than 50% respiratory variability, suggesting right atrial pressure of 3 mmHg. IAS/Shunts: No atrial level shunt detected by color flow Doppler. Additional Comments: 3D was performed not requiring image post processing on an independent workstation and was indeterminate.  LEFT VENTRICLE PLAX 2D                        Biplane EF (MOD) LVIDd:         8.90 cm         LV Biplane EF:   Left LVIDs:         7.80 cm                          ventricular LV PW:         1.10 cm                          ejection LV IVS:        1.30 cm                          fraction by LVOT diam:     2.40 cm                          2D MOD LV SV:         84                               biplane is LV SV Index:   29                               19.2 %. LVOT Area:     4.52 cm                                Diastology                                LV e' medial:    4.35 cm/s LV Volumes (MOD)                LV E/e' medial:  22.0 LV vol d, MOD    587.0 ml      LV e' lateral:   6.64 cm/s A2C:  LV E/e' lateral: 14.4 LV vol d, MOD    643.0 ml A4C: LV vol s, MOD    467.0 ml A2C: LV vol s, MOD    511.0 ml A4C: LV SV MOD A2C:   120.0 ml LV SV MOD A4C:   643.0 ml LV SV MOD BP:    119.3 ml RIGHT VENTRICLE RV Basal diam:  2.90 cm RV S prime:     11.20 cm/s TAPSE (M-mode): 2.1 cm LEFT ATRIUM            Index        RIGHT ATRIUM           Index LA diam:      6.00 cm  2.08 cm/m   RA Area:     20.90 cm LA Vol (A2C): 168.0 ml 58.28 ml/m  RA Volume:   60.70 ml  21.06 ml/m LA Vol (A4C): 150.0 ml 52.04 ml/m  AORTIC VALVE                 PULMONIC VALVE AV Area (Vmax): 3.22 cm     PV Vmax:        1.22 m/s AV Vmax:        146.00 cm/s  PV Peak grad:   6.0 mmHg AV Peak Grad:   8.5 mmHg     RVOT Peak grad: 3 mmHg LVOT Vmax:      104.00 cm/s LVOT Vmean:     64.400 cm/s LVOT VTI:       0.185 m  AORTA Ao Root diam: 4.15 cm Ao Asc diam:  3.50 cm MITRAL VALVE MV Area (PHT): 2.46 cm       SHUNTS MV Decel Time: 308 msec       Systemic VTI:  0.18 m MR Peak grad:    86.1 mmHg    Systemic Diam: 2.40 cm MR Mean grad:    53.0 mmHg MR Vmax:         464.00 cm/s MR Vmean:        344.0 cm/s MR PISA:         4.02 cm MR PISA Eff ROA: 30 mm MR PISA Radius:  0.80 cm MV E velocity: 95.90 cm/s MV A velocity: 49.30 cm/s MV E/A ratio:  1.95 Julien Nordmann MD Electronically signed by Julien Nordmann MD Signature Date/Time: 05/20/2023/12:16:34 PM    Final    DG Chest 2 View Result Date: 05/19/2023 CLINICAL DATA:  One-week history of shortness of breath and productive cough EXAM: CHEST - 2 VIEW COMPARISON:  Chest radiograph dated 07/24/2017 FINDINGS: Normal lung volumes. Diffuse bilateral interstitial opacities. No pleural effusion or pneumothorax. Similar markedly enlarged cardiomediastinal silhouette. No acute osseous abnormality. IMPRESSION: Findings of pulmonary edema and marked cardiomegaly. Electronically Signed   By:  Agustin Cree M.D.   On: 05/19/2023 15:18    Cardiac Studies     Patient Profile     Mr. Philip Monroe is a 48 year old gentleman with past medical history of multivessel coronary disease, ischemic cardiomyopathy, nonsustained VT, frequent PVCs, morbid obesity, likely undiagnosed sleep apnea presenting today with respiratory distress   Assessment & Plan    Acute on chronic diastolic and systolic CHF NYHA class IV on arrival -Off his medications secondary to insurance issues -Worsening cough and shortness of breath over the past several weeks -Severely reduced ejection fraction less than 20%, consistent with studies in 2024 -IV Lasix up to every 8 hours Continue losartan 100 daily, isosorbide dinitrate 10 mg 3 times  daily, spironolactone 25 daily For low urine output may need milrinone and Lasix infusion  Ischemic cardiomyopathy Known coronary artery disease, medical management recommended May 2024 [facility] -Troponin 500, suspect demand ischemia in the setting of respiratory distress March 7 -Heparin 48 hours,  -On aspirin, Zetia, nitrates, Coreg Heparin in place of Eliquis  Hyperlipidemia Statin intolerance, on Repatha, Zetia Currently unable to afford Repatha   Nonsustained VT, frequent PVCs Prior Zio monitor 2024 with 17% PVC burden continue Coreg 3.125 twice daily We will start amiodarone 200 twice daily   Morbid obesity Likely underlying sleep apnea Would likely benefit from GLP-1 agonist   For questions or updates, please contact [facility] Please consult [email or web address] for contact info under        Signed, Julien Nordmann, MD  05/21/2023, 11:39 AM

## 2023-05-22 ENCOUNTER — Encounter: Payer: Self-pay | Admitting: Internal Medicine

## 2023-05-22 ENCOUNTER — Other Ambulatory Visit (HOSPITAL_COMMUNITY): Payer: Self-pay

## 2023-05-22 ENCOUNTER — Telehealth: Payer: Self-pay | Admitting: Pharmacist

## 2023-05-22 ENCOUNTER — Other Ambulatory Visit: Payer: Self-pay

## 2023-05-22 DIAGNOSIS — F1090 Alcohol use, unspecified, uncomplicated: Secondary | ICD-10-CM

## 2023-05-22 DIAGNOSIS — I2510 Atherosclerotic heart disease of native coronary artery without angina pectoris: Secondary | ICD-10-CM

## 2023-05-22 LAB — CBC
HCT: 41.9 % (ref 39.0–52.0)
Hemoglobin: 13.5 g/dL (ref 13.0–17.0)
MCH: 26.2 pg (ref 26.0–34.0)
MCHC: 32.2 g/dL (ref 30.0–36.0)
MCV: 81.2 fL (ref 80.0–100.0)
Platelets: 216 10*3/uL (ref 150–400)
RBC: 5.16 MIL/uL (ref 4.22–5.81)
RDW: 16.5 % — ABNORMAL HIGH (ref 11.5–15.5)
WBC: 8.7 10*3/uL (ref 4.0–10.5)
nRBC: 0 % (ref 0.0–0.2)

## 2023-05-22 LAB — RENAL FUNCTION PANEL
Albumin: 3.4 g/dL — ABNORMAL LOW (ref 3.5–5.0)
Anion gap: 10 (ref 5–15)
BUN: 27 mg/dL — ABNORMAL HIGH (ref 6–20)
CO2: 22 mmol/L (ref 22–32)
Calcium: 8.9 mg/dL (ref 8.9–10.3)
Chloride: 100 mmol/L (ref 98–111)
Creatinine, Ser: 1.14 mg/dL (ref 0.61–1.24)
GFR, Estimated: >60 mL/min (ref >60)
Glucose, Bld: 91 mg/dL (ref 70–99)
Phosphorus: 3.6 mg/dL (ref 2.5–4.6)
Potassium: 3.8 mmol/L (ref 3.5–5.1)
Sodium: 132 mmol/L — ABNORMAL LOW (ref 135–145)

## 2023-05-22 LAB — HEPARIN LEVEL (UNFRACTIONATED): Heparin Unfractionated: 0.55 [IU]/mL (ref 0.30–0.70)

## 2023-05-22 MED ORDER — ELIQUIS 5 MG PO TABS
5.0000 mg | ORAL_TABLET | Freq: Two times a day (BID) | ORAL | 3 refills | Status: DC
Start: 2023-05-22 — End: 2023-05-22

## 2023-05-22 MED ORDER — SACUBITRIL-VALSARTAN 49-51 MG PO TABS
1.0000 | ORAL_TABLET | Freq: Two times a day (BID) | ORAL | Status: DC
  Administered 2023-05-23: 1 via ORAL
  Filled 2023-05-22: qty 1

## 2023-05-22 MED ORDER — SACUBITRIL-VALSARTAN 49-51 MG PO TABS
1.0000 | ORAL_TABLET | Freq: Two times a day (BID) | ORAL | 3 refills | Status: DC
  Filled 2023-05-22: qty 60, 30d supply, fill #0

## 2023-05-22 MED ORDER — EMPAGLIFLOZIN 10 MG PO TABS
10.0000 mg | ORAL_TABLET | Freq: Every day | ORAL | 3 refills | Status: DC
  Filled 2023-05-22: qty 30, 30d supply, fill #0

## 2023-05-22 MED ORDER — SACUBITRIL-VALSARTAN 49-51 MG PO TABS: 1.0000 | ORAL_TABLET | Freq: Two times a day (BID) | ORAL | 3 refills | Status: DC

## 2023-05-22 MED ORDER — APIXABAN 5 MG PO TABS
5.0000 mg | ORAL_TABLET | Freq: Two times a day (BID) | ORAL | Status: DC
  Administered 2023-05-22 – 2023-05-24 (×5): 5 mg via ORAL
  Filled 2023-05-22 (×5): qty 1

## 2023-05-22 MED ORDER — ASPIRIN 81 MG PO CHEW
81.0000 mg | CHEWABLE_TABLET | Freq: Every day | ORAL | Status: DC
  Administered 2023-05-23 – 2023-05-24 (×2): 81 mg via ORAL
  Filled 2023-05-22 (×2): qty 1

## 2023-05-22 MED ORDER — POTASSIUM CHLORIDE CRYS ER 20 MEQ PO TBCR
40.0000 meq | EXTENDED_RELEASE_TABLET | ORAL | Status: AC
Start: 2023-05-22 — End: 2023-05-22
  Administered 2023-05-22 (×2): 40 meq via ORAL
  Filled 2023-05-22 (×2): qty 2

## 2023-05-22 MED ORDER — METOLAZONE 2.5 MG PO TABS
2.5000 mg | ORAL_TABLET | Freq: Once | ORAL | Status: AC
  Administered 2023-05-22: 2.5 mg via ORAL
  Filled 2023-05-22: qty 1

## 2023-05-22 MED ORDER — METOPROLOL SUCCINATE ER 25 MG PO TB24
12.5000 mg | ORAL_TABLET | Freq: Every day | ORAL | Status: DC
  Administered 2023-05-22 – 2023-05-24 (×3): 12.5 mg via ORAL
  Filled 2023-05-22 (×3): qty 1

## 2023-05-22 MED ORDER — MEXILETINE HCL 200 MG PO CAPS
200.0000 mg | ORAL_CAPSULE | Freq: Two times a day (BID) | ORAL | Status: DC
  Administered 2023-05-22 – 2023-05-24 (×5): 200 mg via ORAL
  Filled 2023-05-22 (×5): qty 1

## 2023-05-22 MED ORDER — ALLOPURINOL 100 MG PO TABS
100.0000 mg | ORAL_TABLET | Freq: Every day | ORAL | Status: DC
  Administered 2023-05-22 – 2023-05-24 (×3): 100 mg via ORAL
  Filled 2023-05-22 (×3): qty 1

## 2023-05-22 MED ORDER — EMPAGLIFLOZIN 10 MG PO TABS: 10.0000 mg | ORAL_TABLET | Freq: Every day | ORAL | 3 refills | Status: DC

## 2023-05-22 MED ORDER — ELIQUIS 5 MG PO TABS
5.0000 mg | ORAL_TABLET | Freq: Two times a day (BID) | ORAL | 3 refills | Status: DC
  Filled 2023-05-22: qty 60, 30d supply, fill #0

## 2023-05-22 NOTE — TOC Progression Note (Addendum)
 Transition of Care [facility]) - Progression Note    Patient Details  Name: Philip Monroe MRN: [id number] Date of Birth: 11/25/1975  Transition of [facility] CM/SW Contact  Truddie Hidden, RN Phone Number: 05/22/2023, 11:05 AM  Clinical Narrative:    Albina Billet financial counselor for patient assistance with Medicaid application assistance.   12:35pm Retrieved email message confirming financial counselor will meet with patient today to complete medicaid application.           Expected Discharge Plan and Services                                               Social Determinants of Health (SDOH) Interventions SDOH Screenings   Food Insecurity: No Food Insecurity (05/20/2023)  Housing: Low Risk  (05/22/2023)  Transportation Needs: No Transportation Needs (05/22/2023)  Utilities: Not At Risk (05/20/2023)  Financial Resource Strain: Medium Risk (05/22/2023)  Tobacco Use: High Risk (05/22/2023)    Readmission Risk Interventions     No data to display

## 2023-05-22 NOTE — Assessment & Plan Note (Signed)
 Blood pressure currently within goal -Continue with current medications

## 2023-05-22 NOTE — Consult Note (Signed)
 PHARMACY - ANTICOAGULATION CONSULT NOTE  Pharmacy Consult for Heparin Infusion Indication: chest pain/ACS  Allergies  Allergen Reactions   Lactose Intolerance (Gi) Diarrhea   Statins     myalgias   Patient Measurements: Height: 6\' 3"  (190.5 cm) Weight: (!) 166.9 kg (367 lb 15.2 oz) IBW/kg (Calculated) : 84.5 Heparin Dosing Weight: 125.6 kg  Vital Signs: Temp: 98.3 F (36.8 C) (03/10 0323) Temp Source: Oral (03/10 0323) BP: 107/88 (03/10 0323) Pulse Rate: 57 (03/10 0323)  Labs: Recent Labs    05/19/23 1146 05/19/23 1258 05/19/23 1349 05/19/23 2153 05/20/23 0506 05/20/23 1325 05/21/23 0453 05/21/23 1219 05/21/23 1804 05/22/23 0439  HGB 13.6  --   --   --  12.7*  --  13.1  --   --  13.5  HCT 42.8  --   --   --  39.4  --  41.7  --   --  41.9  PLT 228  --   --   --  206  --  218  --   --  216  APTT  --  30  --   --   --   --   --   --   --   --   LABPROT  --  15.6*  --   --   --   --   --   --   --   --   INR  --  1.2  --   --   --   --   --   --   --   --   HEPARINUNFRC  --  <0.10*  --    < > 0.27*   < > 0.25* 0.59 0.61 0.55  CREATININE 1.06  --   --   --  1.09  --  1.13  --   --   --   TROPONINIHS 571*  --  573*  --   --   --   --   --   --   --    < > = values in this interval not displayed.   Estimated Creatinine Clearance: 134.3 mL/min (by C-G formula based on SCr of 1.13 mg/dL).  Medical History: Past Medical History:  Diagnosis Date   Aortic root dilatation (HCC)    a. 07/2022 Echo: Ao root 4.4cm, Asc Ao 4.0cm.   CAD (coronary artery disease)    a. 08/2017 Cath: LM nl, LAD 48m/d, D1 70ost, LCX 167m, OM1 90 inf branch, 60 sup branch, OM2 small, RCA 30 diffuse, RPDA 100 w/ L->R collats; b. 07/2022 Echo: LM nl, LAD 49m, D1 80, LCX 167m, OM1 80, RCA 81m, RPDA 100-->No targets for PCI/CABG-->Med rx.   Chronic HFrEF (heart failure with reduced ejection fraction) (HCC)    a. 03/2015 Echo: EF 35-40%; b. 08/2017 Echo: EF 15%; c. 08/2017 cMRI: EF 35%; d. 06/2018 Echo: EF  10-15%; e. 07/2022 Echo: EF 15-20%, grIII DD, mild MR, sev dil LA, mod-sev PH, Ao root 4.4cm, Asc Ao 4.0cm; f. 07/2022 RHC: RA 4, RV 38/3, PA 43/18 (26), PCWP 11. CO/CI 5.3/1.8.   CKD (chronic kidney disease), stage II    Gout    Hyperlipidemia LDL goal <70    Hypertension    Ischemic cardiomyopathy    a. 03/2015 Echo: EF 35-40%; b. 08/2017 Echo: EF 15%; c. 08/2017 cMRI: EF 35%; d. 06/2018 Echo: EF 10-15%; e. 07/2022 Echo: EF 15-20%, grIII DD.   IVCD (intraventricular conduction defect)    Left ventricular noncompaction (HCC)  Morbid obesity (HCC)    NSVT (nonsustained ventricular tachycardia) (HCC)    a. 09/2022 Event monitor: predominantly sinus rhythm, avg HR 81 (46-167). 17 runs NSVT (fastest/longest 13 beats x 167). Rare PACs, 17.5% PVC burden.   PVC's (premature ventricular contractions)    a. 09/2022 Event Monitor: 17.5% PVC burden.   Statin intolerance    Tobacco abuse    Assessment: Philip Monroe is a 48 y.o. male presenting with chest pain and SOB. PMH significant for HFrEF (07/2022 EF 15-20%), obesity, severe multivessel CAD, HTN, frequent PVCs . Patient was on [facility] PTA per chart review.  Patient endorses having missed many days of both furosemide and Eliquis due to worsening SOB preventing him from reaching medicine. Last dose of Eliquis estimated on 05/08/2023. cTn are trending up and ECG exhibits sinus rhythm but with multiple, frequent PVCs. Patient is on chronic anticoagulation with Eliquis per chart review, which was started in 03/2023 due to frequent PVCs. Pharmacy has been consulted to initiate and manage heparin infusion.   Baseline Labs: aPTT 30, HL <0.10, PT 15.6, INR 1.2, Hgb 13.6, Hct 42.8, Plt 228   Goal of Therapy:  Heparin level 0.3-0.7 units/ml aPTT 66-102 seconds Monitor platelets by anticoagulation protocol: Yes   Date Time HL Rate/Comment  3/7 2153 0.22 1750/subtherapeutic 3/8 0506 0.27 2000/subtherapeutic 3/8 1325 0.42 2250/therapeutic  x1 3/8 1927 0.44 2250/therapeutic x2 3/9 0453 0.25 2250/subtherapeutic  3/9       1219    0.59    2500/therapeutic x1 3/9 1804 0.61 2500/therapeutic x2 3/10 0439 0.55 2500/therapeutic x 3  Plan:  HL therapeutic x 3 Continue heparin infusion at 2500 units/hr Check HL daily while on heparin infusion Continue to monitor H&H and platelets daily while on heparin infusion   Otelia Sergeant, PharmD, [facility] 05/22/2023 5:39 AM

## 2023-05-22 NOTE — Assessment & Plan Note (Signed)
 Frequent PVCs noted on monitor.  Patient has severely dilated ischemic cardiomyopathy.  PVC burden remained high -Amiodarone is being switched with mexiletine-improved PVC burden with mexiletine -Coreg switched with low-dose metoprolol -Monitor electrolyte closely to keep the potassium more than 4 and magnesium more than 2. -Patient likely need evaluation for AICD

## 2023-05-22 NOTE — Telephone Encounter (Signed)
 Patient to be enrolled in medication management at [facility]. Prescriptions sent to start the patient assistance process.

## 2023-05-22 NOTE — Progress Notes (Signed)
 Heart Failure Nurse Navigator Progress Note  PCP: [facility] PCP-Cardiologist: Advanced Heart Failure Team Admission Diagnosis: Acute on chronic congestive heart failure, unspecified heart failure type [facility]) NSTEMI  (non-ST elevated myocardial infarction) [facility]) Admitted from: Home  Presentation:   Philip Monroe presented with chest pain, shortness of breath and productive cough.  Reported that he typically takes torsemide once a day instead of twice a day but over the last week has had a cough with worsening shortness of breath so he had not been taking it the past few days.  It had been hard for him to get up the stairs to go to the bathroom.  Also reported subjective fevers and chest pain during this period.  He also reported that he had been out of his antihypertensive medications for over a week.  BNP 1,548.3.  ECHO/ LVEF: <20 %  Clinical Course:  Past Medical History:  Diagnosis Date   Aortic root dilatation (HCC)    a. 07/2022 Echo: Ao root 4.4cm, Asc Ao 4.0cm.   CAD (coronary artery disease)    a. 08/2017 Cath: LM nl, LAD 65m/d, D1 70ost, LCX 189m, OM1 90 inf branch, 60 sup branch, OM2 small, RCA 30 diffuse, RPDA 100 w/ L->R collats; b. 07/2022 Echo: LM nl, LAD 87m, D1 80, LCX 137m, OM1 80, RCA 52m, RPDA 100-->No targets for PCI/CABG-->Med rx.   Chronic HFrEF (heart failure with reduced ejection fraction) (HCC)    a. 03/2015 Echo: EF 35-40%; b. 08/2017 Echo: EF 15%; c. 08/2017 cMRI: EF 35%; d. 06/2018 Echo: EF 10-15%; e. 07/2022 Echo: EF 15-20%, grIII DD, mild MR, sev dil LA, mod-sev PH, Ao root 4.4cm, Asc Ao 4.0cm; f. 07/2022 RHC: RA 4, RV 38/3, PA 43/18 (26), PCWP 11. CO/CI 5.3/1.8.   CKD (chronic kidney disease), stage II    Gout    Hyperlipidemia LDL goal <70    Hypertension    Ischemic cardiomyopathy    a. 03/2015 Echo: EF 35-40%; b. 08/2017 Echo: EF 15%; c. 08/2017 cMRI: EF 35%; d. 06/2018 Echo: EF 10-15%; e. 07/2022 Echo: EF 15-20%, grIII DD.   IVCD  (intraventricular conduction defect)    Left ventricular noncompaction [facility])    Morbid obesity (HCC)    NSVT (nonsustained ventricular tachycardia) (HCC)    a. 09/2022 Event monitor: predominantly sinus rhythm, avg HR 81 (46-167). 17 runs NSVT (fastest/longest 13 beats x 167). Rare PACs, 17.5% PVC burden.   PVC's (premature ventricular contractions)    a. 09/2022 Event Monitor: 17.5% PVC burden.   Statin intolerance    Tobacco abuse      Social History   Socioeconomic History   Marital status: Single    Spouse name: Not on file   Number of children: 4   Years of education: Not on file   Highest education level: 10th grade  Occupational History   Not on file  Tobacco Use   Smoking status: Some Days    Current packs/day: 0.00    Types: Cigarettes    Last attempt to quit: 03/20/2015    Years since quitting: 8.1   Smokeless tobacco: Never  Vaping Use   Vaping status: Never Used  Substance and Sexual Activity   Alcohol use: Yes    Alcohol/week: 36.0 standard drinks of alcohol    Types: 36 Cans of beer per week    Comment: 18 cans of beer on Friday and again on Saturday nights   Drug use: No   Sexual activity: Yes  Birth control/protection: Coitus interruptus, Condom  Other Topics Concern   Not on file  Social History Narrative   Not on file   Social Drivers of Health   Financial Resource Strain: Medium Risk (05/22/2023)   Overall Financial Resource Strain (CARDIA)    Difficulty of Paying Living Expenses: Somewhat hard  Food Insecurity: No Food Insecurity (05/20/2023)   Hunger Vital Sign    Worried About Running Out of Food in the Last Year: Never true    Ran Out of Food in the Last Year: Never true  Transportation Needs: No Transportation Needs (05/22/2023)   PRAPARE - Administrator, Civil Service (Medical): No    Lack of Transportation (Non-Medical): No  Physical Activity: Not on file  Stress: Not on file  Social Connections: Not on file   Education  Assessment and Provision:  Detailed education and instructions provided on heart failure disease management including the following:  Signs and symptoms of Heart Failure When to call the physician Importance of daily weights Low sodium diet Fluid restriction Medication management Anticipated future follow-up appointments  Patient education given on each of the above topics.  Patient acknowledges understanding via teach back method and acceptance of all instructions.  Education Materials:  "Living Better With Heart Failure" Booklet, HF zone tool, & Daily Weight Tracker Tool.  Patient has scale at home: Yes.  Not currently doing daily weights though. Encouraged him to start doing this daily.   Patient has pill box at home: Does not use a pill box but has a system that works well for him.    High Risk Criteria for Readmission and/or Poor Patient Outcomes: Heart failure hospital admissions (last 6 months): 2  No Show rate: 0 Difficult social situation: Medication Cost Concerns Demonstrates medication adherence: No Primary Language: English Literacy level: Reading, Writing & Comprehension  Barriers of Care:   Medication Affordability  Considerations/Referrals:   Referral made to Heart Failure Pharmacist Stewardship: Yes Referral made to Heart Failure CSW/NCM TOC: No Referral made to Heart & Vascular TOC clinic: Yes.  Est patient follow-up scheduled for 05/26/23 @ 9:30.  Per patient he is going out of town on 05/28/23 until the 06/09/23.  Items for Follow-up on DC/TOC: Diet & Fluid Restrictions Daily Weights Medication Compliance Continued Heart Failure Education   Roxy Horseman, RN, BSN [facility] Heart Failure Navigator Secure Chat Only

## 2023-05-22 NOTE — Consult Note (Addendum)
 Advanced Heart Failure Team Consult Note   Primary Physician: [facility] Cardiologist:  None  Reason for Consultation: Acute on chronic systolic heart failure  HPI:    Philip Monroe is seen today for evaluation of acute on chronic systolic heart failure at the request of Dr. Nelson Chimes.   Philip Monroe is a 48 y.o. AAM with multivessel CAD, chronic HFrEF, iCM, noncompaction CM, HTN, hx ETOH use, noncompliance and morbid obesity.   Last seen in AHF clinic here in 2017.   Followed by [facility] cardiology for iCM/noncompaction CM.  RHC 5/24: Normal LV and RV filling pressures. Mild pulmonary hypertension with mild elevation of PVL. Low cardiac index 1.8. LHC 5/24: Severe multivessel CAD including 80% mid RCA, 100% RPDA, 100% mid LCx, 80% OM1, 80% mid-distal LAD. Medically managed.   He presented to the ED 2 days ago with SOB and chest tightness with exertion. He had been out of his meds for about a month. Noncompliant with some of his regimen at home. Drinks 40 oz beer on the weekends. Smokes 1PPD. Worked with [employer] previously but hasn't worked in a couple of months now. Unsure about insurance. Sometimes be takes his BP at home and its elevated, unable to give me a number. Feels palpitations when he gets agitated or when he goes up stairs. Admission labs reviewed: BNP 1.5K, HsTrrp 571>573, Na 134, SCr 1.06, TSH 1.160. Echo this admission with EF <20%, LV with GHK, LV severely dilated, RV mod reduced, LA severely dilated, mild-mod MR. Has started to diurese with IV lasix.   Sitting up in chair. Denies CP/SOB.  Home Medications Prior to Admission medications   Medication Sig Start Date End Date Taking? Authorizing Provider  aspirin 325 MG tablet Take 325 mg by mouth daily.   Yes [provider]  ELIQUIS 5 MG TABS tablet Take 5 mg by mouth 2 (two) times daily.   Yes [provider]  ezetimibe (ZETIA) 10 MG tablet Take 10 mg by mouth at bedtime.    Yes [provider]  hydrALAZINE (APRESOLINE) 10 MG tablet Take 10 mg by mouth 3 (three) times daily.   Yes [provider]  spironolactone (ALDACTONE) 25 MG tablet Take 25 mg by mouth daily.   Yes [provider]  torsemide (DEMADEX) 20 MG tablet Take 20 mg by mouth 2 (two) times daily. May take an additional tablet (20 mg) as needed for weight gain >2 lbs in 24 hours 08/25/22  Yes [provider]  furosemide (LASIX) 20 MG tablet Take 1 tablet (20 mg total) by mouth 2 (two) times daily. Patient not taking: Reported on 05/19/2023 05/18/15   [name] A, FNP  isosorbide dinitrate (ISORDIL) 10 MG tablet Take 10 mg by mouth 3 (three) times daily.    [provider]  losartan (COZAAR) 100 MG tablet Take 100 mg by mouth daily.    [provider]  Multiple Vitamin (MULTIVITAMIN WITH MINERALS) TABS tablet Take 1 tablet by mouth daily. Patient not taking: Reported on 05/19/2023    [provider]  vitamin B-12 (CYANOCOBALAMIN) 1000 MCG tablet Take 1,000 mcg by mouth daily. Patient not taking: Reported on 05/19/2023    [provider]    Past Medical History: Past Medical History:  Diagnosis Date   Aortic root dilatation (HCC)    a. 07/2022 Echo: Ao root 4.4cm, Asc Ao 4.0cm.   CAD (coronary artery disease)    a. 08/2017 Cath: LM nl,  LAD 90m/d, D1 70ost, LCX 154m, OM1 90 inf branch, 60 sup branch, OM2 small, RCA 30 diffuse, RPDA 100 w/ L->R collats; b. 07/2022 Echo: LM nl, LAD 70m, D1 80, LCX 164m, OM1 80, RCA 66m, RPDA 100-->No targets for PCI/CABG-->Med rx.   Chronic HFrEF (heart failure with reduced ejection fraction) (HCC)    a. 03/2015 Echo: EF 35-40%; b. 08/2017 Echo: EF 15%; c. 08/2017 cMRI: EF 35%; d. 06/2018 Echo: EF 10-15%; e. 07/2022 Echo: EF 15-20%, grIII DD, mild MR, sev dil LA, mod-sev PH, Ao root 4.4cm, Asc Ao 4.0cm; f. 07/2022 RHC: RA 4, RV 38/3, PA 43/18 (26), PCWP 11. CO/CI 5.3/1.8.   CKD (chronic kidney disease), stage II     Gout    Hyperlipidemia LDL goal <70    Hypertension    Ischemic cardiomyopathy    a. 03/2015 Echo: EF 35-40%; b. 08/2017 Echo: EF 15%; c. 08/2017 cMRI: EF 35%; d. 06/2018 Echo: EF 10-15%; e. 07/2022 Echo: EF 15-20%, grIII DD.   IVCD (intraventricular conduction defect)    Left ventricular noncompaction [facility])    Morbid obesity (HCC)    NSVT (nonsustained ventricular tachycardia) (HCC)    a. 09/2022 Event monitor: predominantly sinus rhythm, avg HR 81 (46-167). 17 runs NSVT (fastest/longest 13 beats x 167). Rare PACs, 17.5% PVC burden.   PVC's (premature ventricular contractions)    a. 09/2022 Event Monitor: 17.5% PVC burden.   Statin intolerance    Tobacco abuse     Past Surgical History: History reviewed. No pertinent surgical history.  Family History: History reviewed. No pertinent family history.  Social History: Social History   Socioeconomic History   Marital status: Single    Spouse name: Not on file   Number of children: Not on file   Years of education: Not on file   Highest education level: Not on file  Occupational History   Not on file  Tobacco Use   Smoking status: Some Days    Current packs/day: 0.00    Types: Cigarettes    Last attempt to quit: 03/20/2015    Years since quitting: 8.1   Smokeless tobacco: Never  Substance and Sexual Activity   Alcohol use: Yes    Alcohol/week: 36.0 standard drinks of alcohol    Types: 36 Cans of beer per week    Comment: 18 cans of beer on Friday and again on Saturday nights   Drug use: No   Sexual activity: Yes    Birth control/protection: Coitus interruptus, Condom  Other Topics Concern   Not on file  Social History Narrative   Not on file   Social Drivers of Health   Financial Resource Strain: High Risk (07/19/2022)   [facility]   Overall Financial Resource Strain (CARDIA)    Difficulty of Paying Living Expenses: Very hard  Food Insecurity: No Food Insecurity (05/20/2023)   Hunger Vital Sign    Worried  About Running Out of Food in the Last Year: Never true    Ran Out of Food in the Last Year: Never true  Transportation Needs: No Transportation Needs (05/20/2023)   PRAPARE - Administrator, Civil Service (Medical): No    Lack of Transportation (Non-Medical): No  Physical Activity: Not on file  Stress: Not on file  Social Connections: Not on file    Allergies:  Allergies  Allergen Reactions   Lactose Intolerance (Gi) Diarrhea   Statins     myalgias   Objective:    Vital  Signs:   Temp:  [97.7 F (36.5 C)-98.3 F (36.8 C)] 98 F (36.7 C) (03/10 0735) Pulse Rate:  [49-69] 49 (03/10 0735) Resp:  [18-19] 18 (03/10 0735) BP: (95-127)/(62-97) 116/67 (03/10 0735) SpO2:  [95 %-100 %] 97 % (03/10 0735) Weight:  [166.9 kg] 166.9 kg (03/10 0430) Last BM Date : 05/21/23  Weight change: Filed Weights   05/19/23 1144 05/21/23 0500 05/22/23 0430  Weight: (!) 172.4 kg (!) 167.8 kg (!) 166.9 kg    Intake/Output:   Intake/Output Summary (Last 24 hours) at 05/22/2023 0837 Last data filed at 05/22/2023 0739 Gross per 24 hour  Intake 1820 ml  Output 3725 ml  Net -1905 ml    Physical Exam  General:  well appearing.  No respiratory difficulty HEENT: normal Neck: supple. JVD ~8 cm but difficult to see.  Cor: PMI nondisplaced. Regular rate & rhythm. No rubs, gallops or murmurs. Lungs: clear Extremities: no cyanosis, clubbing, rash, +1 BLE edema  Neuro: alert & oriented x 3. Moves all 4 extremities w/o difficulty. Affect pleasant.   Telemetry   NSR 70s. 20-30 PVCs/hr (Personally reviewed)    EKG    NSR with PVCs (bigeminy) 90s  Labs   Basic Metabolic Panel: Recent Labs  Lab 05/19/23 1146 05/19/23 1349 05/20/23 0506 05/21/23 0453  NA 134*  --  135 133*  K 3.7  --  3.7 3.4*  CL 103  --  101 100  CO2 21*  --  21* 24  GLUCOSE 114*  --  102* 89  BUN 21*  --  23* 26*  CREATININE 1.06  --  1.09 1.13  CALCIUM 8.9  --  8.7* 8.6*  MG 1.9  --   --   --   PHOS  --   3.5  --   --     Liver Function Tests: Recent Labs  Lab 05/19/23 1146  AST 20  ALT 20  ALKPHOS 54  BILITOT 1.8*  PROT 7.4  ALBUMIN 3.5   No results for input(s): "LIPASE", "AMYLASE" in the last 168 hours. No results for input(s): "AMMONIA" in the last 168 hours.  CBC: Recent Labs  Lab 05/19/23 1146 05/20/23 0506 05/21/23 0453 05/22/23 0439  WBC 7.0 6.9 7.8 8.7  HGB 13.6 12.7* 13.1 13.5  HCT 42.8 39.4 41.7 41.9  MCV 82.1 81.9 82.6 81.2  PLT 228 206 218 216    Cardiac Enzymes: No results for input(s): "CKTOTAL", "CKMB", "CKMBINDEX", "TROPONINI" in the last 168 hours.  BNP: BNP (last 3 results) Recent Labs    05/19/23 1146  BNP 1,548.3*    ProBNP (last 3 results) No results for input(s): "PROBNP" in the last 8760 hours.   CBG: No results for input(s): "GLUCAP" in the last 168 hours.  Coagulation Studies: Recent Labs    05/19/23 1258  LABPROT 15.6*  INR 1.2     Imaging   No results found.   Medications:     Current Medications:  amiodarone  200 mg Oral BID   aspirin  325 mg Oral Daily   carvedilol  3.125 mg Oral BID WC   ezetimibe  10 mg Oral QHS   furosemide  80 mg Intravenous Q8H   guaiFENesin  1,200 mg Oral BID   hydrALAZINE  10 mg Oral TID   isosorbide dinitrate  10 mg Oral TID   losartan  100 mg Oral Daily   sodium chloride flush  3 mL Intravenous Q12H   spironolactone  25 mg Oral Daily  Infusions:  heparin 2,500 Units/hr (05/21/23 2343)   Patient Profile   Philip Monroe is a 48 y.o. AAM with multivessel CAD, chronic HFrEF, iCM, noncompaction CM, HTN, hx ETOH use, noncompliance and morbid obesity. AHF team to see for acute on chronic systolic heart failure  Assessment/Plan  Acute on chronic systolic heart failure, iCM - Mentions of compaction CM from [facility] cards notes. On Eliquis 5 mg BID (currently on hep gtt) - RHC 5/24: Normal LV and RV filling pressures. Mild pulmonary hypertension with mild elevation of PVL. Low cardiac  index 1.8  - Echo this admission with EF <20%, LV with GHK, LV severely dilated, RV mod reduced, LA severely dilated, mild-mod MR - NYHA IV on admission - Volume remains elevated. Continue Lasix 80 IV Q8. Add metolazone 2.5 mg x1 today. - Continue Hydral/isordil  - Continue Spiro 25 mg daily - Stop losartan. Switch to Entresto 49/51 mg BID - Strict I&O, daily weights  CAD - LHC 5/24: Severe multivessel CAD including 80% mid RCA, 100% RPDA, 100% mid LCx, 80% OM1, 80% mid-distal LAD.  - Medical management recommended at [facility] cards 5/24 - HsTrop 161>096 - Continue Zetia, ASA - Currently on coreg 3.125, patient states he will not take this at discharge as it causes gout (educated on this but stated he will not take it). Will switch to Metoprolol XL 12.5 QHS - Continue Hydral/Isordil - Stop heparin gtt, transition back to Eliquis 5 mg BID.   PVCs - Zio 2024 with 17% burden - On tele today 20-30/hr. Remains with high burden - Given young age would switch amiodarone to mexiletine, 200 mg BID - TSH WNL - Has not had sleep study, does not seem like a fan of CPAP/BiPAP (just from out discussion although he has never tried it before). Would still do OP sleep study.   HTN - BP stable on current regimen - Switch losartan to Entresto 49/51 mg BID  CKD stage II - SCr at baseline  HLD - intolerant of statins - Continue Zetia - On Repatha PTA - Update lipid panel  Obesity - Body mass index is 45.99 kg/m.   Tobacco / ETOH use - Smokes 1 PPD - Drinks 40 oz beer on the weekends - Cessation advised  Medication concerns reviewed with patient and pharmacy team. Barriers identified: noncompliance, uninsured?. Will consult TOC to assist with Medicaid. PharmD working on patient assistance forms.   Length of Stay: 3  Alen Bleacher, NP  05/22/2023, 8:37 AM  Advanced Heart Failure Team Pager [phone] (M-F; 7a - 5p)  Please contact [facility] for night-coverage after hours (4p -7a ) and  weekends on [email or web address]   Patient seen and examined with the above-signed Advanced Practice Provider and/or Housestaff. I personally reviewed laboratory data, imaging studies and relevant notes. I independently examined the patient and formulated the important aspects of the plan. I have edited the note to reflect any of my changes or salient points. I have personally discussed the plan with the patient and/or family.  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  Has been followed by [facility] HF.   Lives at home with his mother. Baseline NYHA III HF. No recent CP.   Admitted with recurrent HF in setting of medication noncompliance.   Echo EF < 20% LV severely dilated. RV mod HK  Hs trop flat ~500  Responding well to diuretics. Breathing better.   Frequent PVCs on monitor  General:  Sitting in chair. No resp difficulty HEENT: normal Neck: supple.JVP to jaw Carotids 2+ bilat; no bruits. No lymphadenopathy or thryomegaly appreciated. ZOX:[id number] rate & rhythm. No rubs, gallops or murmurs. Lungs: clear Abdomen:  obese soft, nontender, nondistended. No hepatosplenomegaly. No bruits or masses. Good bowel sounds. Extremities: no cyanosis, clubbing, rash, 2+  edema Neuro: alert & orientedx3, cranial nerves grossly intact. moves all 4 extremities w/o difficulty. Affect pleasant  He is significantly volume overloaded in setting of medication noncomplance. Responding well to IV lasix. Continues with frequent PVCs despite amio. Will switch to mexilitene.   Long talk about possible need for advanced therapies in the not too distant future but says he ahs been told this before and said he didn't have the money for a new hear and "we all got to die some time."   Stressed need for med compliance and abstinence from ETOH/tobacco to let us develop a ""plan B" for him   Hs trop elevated likely due to HF. No evidence ACS  We will follow.    Arvilla Meres, MD  2:12 PM

## 2023-05-22 NOTE — Progress Notes (Signed)
 Progress Note   [name] OZH:[id number] DOB: 10/28/1975 DOA: 05/19/2023     3 DOS: the patient was seen and examined on 05/22/2023   Brief hospital course: Taken from H&P.   Philip Monroe is a 48 y.o. male with medical history significant of multivessel CAD, chronic HFrEF with LVEF 15-20 % 2024, ischemic cardiomyopathy, HTN, chronic anticoagulation secondary to CAD and ischemic cardiomyopathy, morbid obesity, presented with worsening of cough shortness of breath and chest pain for about a week.  Patient ran out of his CHF medications including spironolactone, hydralazine, Isordil 2 to 3 weeks ago due to loss of insurance coverage.   On presentation hemodynamically stable on room air, chest x-ray with pulmonary congestion.  Labs seems stable, troponin 571, EKG with NSR and frequent PVCs.  Patient was started on heparin infusion and IV diuresis.  3/8: Vital stable, troponin with a flat trend, BNP 1548, echocardiogram with EF of less than 20% with severely dilated chambers.  Mild to moderate MR. Advanced heart failure team was also consulted.  3/9: Vital stable, UOP was not recorded.  IV Lasix was increased to Q8 hourly by cardiology.  Might need milrinone with Lasix infusion if UOP remains low.  Told nurse again for strict intake and output  3/10: Hemodynamically stable UOP of 2600 recorded, 2 pound decrease in weight.  Renal function stable, mild hyponatremia with sodium at 132 likely with IV Lasix use.  Heart failure team switched heparin infusion with Eliquis, change losartan with Entresto, added metolazone and advising to continue diuresis.  High PVC load on telemetry, home amiodarone is also being switched with mexiletine. Assessment and Plan: * Acute on chronic combined systolic and diastolic CHF (congestive heart failure) (HCC) History of EF of 15 to 20% with grade 2 diastolic dysfunction, moderate to severe pulmonary hypertension.  Multiple hospitalization due to acute  exacerbation.  Patient ran out of his medications due to financial reason and change in insurance. Repeat echocardiogram with less than 20% EF, severely dilated chambers and indeterminate diastolic function. -Continue with IV diuresis-dose increased to every 8 hourly -Metolazone was added today by heart failure team -Daily weight and BMP -Strict intake and output -Coreg switched with metoprolol -Losartan switched with Entresto -Spironolactone was added -Cardiology was also consulted -Restarted home Eliquis  NSTEMI (non-ST elevated myocardial infarction) (HCC) Troponin mildly elevated with a flat curve.  Likely demand ischemia with history of significant CAD.  No chest pain now Cardiac cath from last year with severe multivessel disease including 80% mid RCA, 100% RPDA, 100% mid LCx, 80% OM1, 80% mid-distal LAD, with no ideal targets for PCI or surgical revascularization with concomitant non-compaction cardiomyopathy.  Medical management was suggested. History of multiple PVCs, his home Coreg was apparently discontinued for concern of bradycardia last year. Home Coreg is being switched with low-dose metoprolol by cardiology due to his concern of Coreg causing gout -Completed 48 hours of heparin infusion-it is being switched with Eliquis now which is his home med -Continue aspirin, Zetia, nitrate and beta-blocker -Cardiology is on board  PVC's (premature ventricular contractions) Frequent PVCs noted on monitor.  Patient has severely dilated ischemic cardiomyopathy.  PVC burden remained high -Amiodarone is being switched with mexiletine -Coreg switched with low-dose metoprolol -Monitor electrolyte closely to keep the potassium more than 4 and magnesium more than 2. -Patient likely need evaluation for AICD  Hypertension Blood pressure currently within goal -Continue with current medications  Hyperlipidemia LDL goal <70 Patient apparently have intolerance to statin, he  was on Repatha and  unable to take it now due to financial difficulties. -Continue with Zetia -Outpatient pharmacy help to resume Repatha or explore other options  CKD (chronic kidney disease), stage II Renal function currently seems stable. -Monitor renal function closely while patient is being diuresed -Avoid nephrotoxins  Morbid obesity (HCC) Estimated body mass index is 47.5 kg/m as calculated from the following:   Height as of this encounter: 6\' 3"  (1.905 m).   Weight as of this encounter: 172.4 kg.   -This will complicate overall prognosis -Encouraged weight loss   Subjective: Patient was sitting comfortably in chair when seen today.  Continue to have significant exertional dyspnea.  No chest pain  Physical Exam: Vitals:   05/22/23 0323 05/22/23 0430 05/22/23 0735 05/22/23 1111  BP: 107/88  116/67 93/71  Pulse: (!) 57  (!) 49 (!) 36  Resp: 19  18 20   Temp: 98.3 F (36.8 C)  98 F (36.7 C) (!) 97.5 F (36.4 C)  TempSrc: Oral   Oral  SpO2: 95%  97% 100%  Weight:  (!) 166.9 kg    Height:       General.  Morbidly obese gentleman, in no acute distress. Pulmonary.  Lungs clear bilaterally, normal respiratory effort. CV.  Regular rate and rhythm, significant number of extra beats Abdomen.  Soft, nontender, nondistended, BS positive. CNS.  Alert and oriented .  No focal neurologic deficit. Extremities.  No edema, no cyanosis, pulses intact and symmetrical. Psychiatry.  Judgment and insight appears normal.   Data Reviewed: Prior data reviewed  Family Communication: Discussed with patient  Disposition: Status is: Inpatient Remains inpatient appropriate because: Severity of illness  Planned Discharge Destination: Home  DVT prophylaxis.  Heparin infusion Time spent: 50 minutes  This record has been created using Conservation officer, historic buildings. Errors have been sought and corrected,but may not always be located. Such creation errors do not reflect on the standard of care.    Author: Arnetha Courser, MD 05/22/2023 1:10 PM  For on call review [email or web address].

## 2023-05-22 NOTE — Consult Note (Signed)
 PHARMACY - ANTICOAGULATION CONSULT NOTE  Pharmacy Consult for Heparin > Eliquis Indication: chest pain/ACS  Allergies  Allergen Reactions   Lactose Intolerance (Gi) Diarrhea   Statins     myalgias   Patient Measurements: Height: 6\' 3"  (190.5 cm) Weight: (!) 166.9 kg (367 lb 15.2 oz) IBW/kg (Calculated) : 84.5 Heparin Dosing Weight: 125.6 kg  Vital Signs: Temp: 98 F (36.7 C) (03/10 0735) Temp Source: Oral (03/10 0323) BP: 116/67 (03/10 0735) Pulse Rate: 49 (03/10 0735)  Labs: Recent Labs    05/19/23 1146 05/19/23 1258 05/19/23 1349 05/19/23 2153 05/20/23 0506 05/20/23 1325 05/21/23 0453 05/21/23 1219 05/21/23 1804 05/22/23 0439  HGB 13.6  --   --   --  12.7*  --  13.1  --   --  13.5  HCT 42.8  --   --   --  39.4  --  41.7  --   --  41.9  PLT 228  --   --   --  206  --  218  --   --  216  APTT  --  30  --   --   --   --   --   --   --   --   LABPROT  --  15.6*  --   --   --   --   --   --   --   --   INR  --  1.2  --   --   --   --   --   --   --   --   HEPARINUNFRC  --  <0.10*  --    < > 0.27*   < > 0.25* 0.59 0.61 0.55  CREATININE 1.06  --   --   --  1.09  --  1.13  --   --  1.14  TROPONINIHS 571*  --  573*  --   --   --   --   --   --   --    < > = values in this interval not displayed.   Estimated Creatinine Clearance: 133.1 mL/min (by C-G formula based on SCr of 1.14 mg/dL).  Medical History: Past Medical History:  Diagnosis Date   Aortic root dilatation (HCC)    a. 07/2022 Echo: Ao root 4.4cm, Asc Ao 4.0cm.   CAD (coronary artery disease)    a. 08/2017 Cath: LM nl, LAD 25m/d, D1 70ost, LCX 12m, OM1 90 inf branch, 60 sup branch, OM2 small, RCA 30 diffuse, RPDA 100 w/ L->R collats; b. 07/2022 Echo: LM nl, LAD 51m, D1 80, LCX 144m, OM1 80, RCA 69m, RPDA 100-->No targets for PCI/CABG-->Med rx.   Chronic HFrEF (heart failure with reduced ejection fraction) (HCC)    a. 03/2015 Echo: EF 35-40%; b. 08/2017 Echo: EF 15%; c. 08/2017 cMRI: EF 35%; d. 06/2018 Echo: EF  10-15%; e. 07/2022 Echo: EF 15-20%, grIII DD, mild MR, sev dil LA, mod-sev PH, Ao root 4.4cm, Asc Ao 4.0cm; f. 07/2022 RHC: RA 4, RV 38/3, PA 43/18 (26), PCWP 11. CO/CI 5.3/1.8.   CKD (chronic kidney disease), stage II    Gout    Hyperlipidemia LDL goal <70    Hypertension    Ischemic cardiomyopathy    a. 03/2015 Echo: EF 35-40%; b. 08/2017 Echo: EF 15%; c. 08/2017 cMRI: EF 35%; d. 06/2018 Echo: EF 10-15%; e. 07/2022 Echo: EF 15-20%, grIII DD.   IVCD (intraventricular conduction defect)    Left ventricular noncompaction (HCC)  Morbid obesity (HCC)    NSVT (nonsustained ventricular tachycardia) (HCC)    a. 09/2022 Event monitor: predominantly sinus rhythm, avg HR 81 (46-167). 17 runs NSVT (fastest/longest 13 beats x 167). Rare PACs, 17.5% PVC burden.   PVC's (premature ventricular contractions)    a. 09/2022 Event Monitor: 17.5% PVC burden.   Statin intolerance    Tobacco abuse    Assessment: Philip Monroe is a 48 y.o. male presenting with chest pain and SOB. PMH significant for HFrEF (07/2022 EF 15-20%), obesity, severe multivessel CAD, HTN, frequent PVCs . Patient was on [facility] PTA per chart review.  Patient endorses having missed many days of both furosemide and Eliquis due to worsening SOB preventing him from reaching medicine. Last dose of Eliquis estimated on 05/08/2023. cTn are trending up and ECG exhibits sinus rhythm but with multiple, frequent PVCs. Patient is on chronic anticoagulation with Eliquis per chart review, which was started in 03/2023 due to frequent PVCs. Pharmacy has been consulted to initiate and manage heparin infusion.   Baseline Labs: aPTT 30, HL <0.10, PT 15.6, INR 1.2, Hgb 13.6, Hct 42.8, Plt 228   Goal of Therapy:  Heparin level 0.3-0.7 units/ml aPTT 66-102 seconds Monitor platelets by anticoagulation protocol: Yes   Date Time HL Rate/Comment  3/7 2153 0.22 1750/subtherapeutic 3/8 0506 0.27 2000/subtherapeutic 3/8 1325 0.42 2250/therapeutic  x1 3/8 1927 0.44 2250/therapeutic x2 3/9 0453 0.25 2250/subtherapeutic  3/9       1219    0.59    2500/therapeutic x1 3/9 1804 0.61 2500/therapeutic x2 3/10 0439 0.55 2500/therapeutic x 3  Plan:  HL therapeutic x 3 Continue heparin infusion at 2500 units/hr Check HL daily while on heparin infusion Continue to monitor H&H and platelets daily while on heparin infusion   Otelia Sergeant, PharmD, [facility] 05/22/2023 9:36 AM   ADDENDUM:  Patient has completed 48 hours medical management with heparin. Given no procedures are planned, cardiology team has transitioned to Eliquis 5 mg BID. Will stop heparin infusion and discontinue consult per CHF team.  Please do not hesitate to reach out with questions or concerns,  Enos Fling, PharmD, CPP, BCPS Heart Failure Pharmacist  Phone - [phone] 05/22/2023 9:37 AM

## 2023-05-23 LAB — CBC
HCT: 45.2 % (ref 39.0–52.0)
Hemoglobin: 14.4 g/dL (ref 13.0–17.0)
MCH: 26 pg (ref 26.0–34.0)
MCHC: 31.9 g/dL (ref 30.0–36.0)
MCV: 81.6 fL (ref 80.0–100.0)
Platelets: 239 10*3/uL (ref 150–400)
RBC: 5.54 MIL/uL (ref 4.22–5.81)
RDW: 16.6 % — ABNORMAL HIGH (ref 11.5–15.5)
WBC: 7.3 10*3/uL (ref 4.0–10.5)
nRBC: 0 % (ref 0.0–0.2)

## 2023-05-23 LAB — BASIC METABOLIC PANEL
Anion gap: 11 (ref 5–15)
BUN: 34 mg/dL — ABNORMAL HIGH (ref 6–20)
CO2: 21 mmol/L — ABNORMAL LOW (ref 22–32)
Calcium: 9.2 mg/dL (ref 8.9–10.3)
Chloride: 99 mmol/L (ref 98–111)
Creatinine, Ser: 1.38 mg/dL — ABNORMAL HIGH (ref 0.61–1.24)
GFR, Estimated: >60 mL/min (ref >60)
Glucose, Bld: 95 mg/dL (ref 70–99)
Potassium: 4.3 mmol/L (ref 3.5–5.1)
Sodium: 131 mmol/L — ABNORMAL LOW (ref 135–145)

## 2023-05-23 LAB — LIPID PANEL
Cholesterol: 142 mg/dL (ref 0–200)
HDL: 24 mg/dL — ABNORMAL LOW (ref >40)
LDL Cholesterol: 93 mg/dL (ref 0–99)
Total CHOL/HDL Ratio: 5.9 ratio
Triglycerides: 124 mg/dL (ref <150)
VLDL: 25 mg/dL (ref 0–40)

## 2023-05-23 LAB — MAGNESIUM: Magnesium: 2.1 mg/dL (ref 1.7–2.4)

## 2023-05-23 MED ORDER — FUROSEMIDE 10 MG/ML IJ SOLN
80.0000 mg | Freq: Two times a day (BID) | INTRAMUSCULAR | Status: DC
  Administered 2023-05-23: 80 mg via INTRAVENOUS
  Filled 2023-05-23: qty 8

## 2023-05-23 MED ORDER — SACUBITRIL-VALSARTAN 24-26 MG PO TABS
1.0000 | ORAL_TABLET | Freq: Two times a day (BID) | ORAL | Status: DC
  Administered 2023-05-23: 1 via ORAL
  Filled 2023-05-23: qty 1

## 2023-05-23 MED ORDER — EMPAGLIFLOZIN 10 MG PO TABS
10.0000 mg | ORAL_TABLET | Freq: Every day | ORAL | Status: DC
  Administered 2023-05-23: 10 mg via ORAL
  Filled 2023-05-23 (×2): qty 1

## 2023-05-23 NOTE — Progress Notes (Signed)
 Advanced Heart Failure Rounding Note  Cardiologist: None  Chief Complaint:  Subjective:    Diuresing well on lasix 80 IV tid.   Out over 4L. Weight  down 5 pounds. Drinking a lot of fluids. Denies SOB, orthopnea or PND. Walking the room.  Scr 1.14 -> 1.38  PVC much improved with mexilitene    Objective:   Weight Range: (!) 164.2 kg Body mass index is 45.25 kg/m.   Vital Signs:   Temp:  [97.5 F (36.4 C)-98.3 F (36.8 C)] 98 F (36.7 C) (03/11 0853) Pulse Rate:  [36-75] 64 (03/11 0853) Resp:  [15-20] 20 (03/11 0853) BP: (84-121)/(40-94) 106/74 (03/11 0853) SpO2:  [94 %-100 %] 94 % (03/11 0853) Weight:  [164.2 kg-166.7 kg] 164.2 kg (03/11 0546) Last BM Date : 05/22/23  Weight change: Filed Weights   05/22/23 0430 05/23/23 0452 05/23/23 0546  Weight: (!) 166.9 kg (!) 166.7 kg (!) 164.2 kg    Intake/Output:   Intake/Output Summary (Last 24 hours) at 05/23/2023 1013 Last data filed at 05/23/2023 0854 Gross per 24 hour  Intake 1200 ml  Output 5600 ml  Net -4400 ml      Physical Exam    General:  Sitting in chair No resp difficulty HEENT: Normal Neck: Supple. JVP hard to see looks like 10 . Carotids 2+ bilat; no bruits. No lymphadenopathy or thyromegaly appreciated. Cor: Regular rate & rhythm. No rubs, gallops or murmurs. Lungs: Clear Abdomen: obese Soft, nontender, nondistended. No hepatosplenomegaly. No bruits or masses. Good bowel sounds. Extremities: No cyanosis, clubbing, rash, tr-1+ edema + UNNA Neuro: Alert & orientedx3, cranial nerves grossly intact. moves all 4 extremities w/o difficulty. Affect pleasant   Telemetry   HR 60s ~0-5 PVCs/min Personally reviewed   Labs    CBC Recent Labs    05/22/23 0439 05/23/23 0434  WBC 8.7 7.3  HGB 13.5 14.4  HCT 41.9 45.2  MCV 81.2 81.6  PLT 216 239   Basic Metabolic Panel Recent Labs    95/28/41 0439 05/23/23 0434  NA 132* 131*  K 3.8 4.3  CL 100 99  CO2 22 21*  GLUCOSE 91 95  BUN 27*  34*  CREATININE 1.14 1.38*  CALCIUM 8.9 9.2  MG  --  2.1  PHOS 3.6  --    Liver Function Tests Recent Labs    05/22/23 0439  ALBUMIN 3.4*   No results for input(s): "LIPASE", "AMYLASE" in the last 72 hours. Cardiac Enzymes No results for input(s): "CKTOTAL", "CKMB", "CKMBINDEX", "TROPONINI" in the last 72 hours.  BNP: BNP (last 3 results) Recent Labs    05/19/23 1146  BNP 1,548.3*    ProBNP (last 3 results) No results for input(s): "PROBNP" in the last 8760 hours.   D-Dimer No results for input(s): "DDIMER" in the last 72 hours. Hemoglobin A1C No results for input(s): "HGBA1C" in the last 72 hours. Fasting Lipid Panel Recent Labs    05/23/23 0434  CHOL 142  HDL 24*  LDLCALC 93  TRIG 324  CHOLHDL 5.9   Thyroid Function Tests No results for input(s): "TSH", "T4TOTAL", "T3FREE", "THYROIDAB" in the last 72 hours.  Invalid input(s): "FREET3"  Other results:   Imaging    No results found.   Medications:     Scheduled Medications:  allopurinol  100 mg Oral Daily   apixaban  5 mg Oral BID   aspirin  81 mg Oral Daily   empagliflozin  10 mg Oral Daily   ezetimibe  10  mg Oral QHS   furosemide  80 mg Intravenous BID   guaiFENesin  1,200 mg Oral BID   hydrALAZINE  10 mg Oral TID   isosorbide dinitrate  10 mg Oral TID   metoprolol succinate  12.5 mg Oral Daily   mexiletine  200 mg Oral Q12H   sacubitril-valsartan  1 tablet Oral BID   sodium chloride flush  3 mL Intravenous Q12H   spironolactone  25 mg Oral Daily    Infusions:   PRN Medications: acetaminophen, ipratropium-albuterol, ondansetron (ZOFRAN) IV, sodium chloride flush     Assessment/Plan   Acute on chronic systolic heart failure, iCM - Mentions of compaction CM from [facility] cards notes. On Eliquis 5 mg BID (currently on hep gtt) - RHC 5/24: Normal LV and RV filling pressures. Mild pulmonary hypertension with mild elevation of PVL. Low cardiac index 1.8  - Echo this admission with EF  <20%, LV with GHK, LV severely dilated, RV mod reduced, LA severely dilated, mild-mod MR - NYHA IV on admission - Diuresed well overnight. Weight down 5 pounds. Still somewhat overloaded but Scr up a bit - Will decrease lasix to 80 iv bid. No metolazone today - Stop hydral/isordil as he is likely not going to comply with tid dosing focus on optimizing other GDMT for now  - Continue Spiro 25 mg daily - Decrease Entresto to 24/26 bid while diuresing - Continue Toprol 12.5 - Continue Jardiance - Strict I&O, daily weights -Long talk about possible need for advanced therapies in the not too distant future but says he ahs been told this before and said he didn't have the money for a new hear and "we all got to die some time."  - Currently not candidate for advanced therapies    CAD - LHC 5/24: Severe multivessel CAD including 80% mid RCA, 100% RPDA, 100% mid LCx, 80% OM1, 80% mid-distal LAD.  - Medical management recommended at [facility] cards 5/24 - HsTrop 571>573. No evidence ACS. Likely demand ischemia from HF - Continue Zetia, ASA - Currently on coreg 3.125, patient states he will not take this at discharge as it causes gout (educated on this but stated he will not take it). Have switched to Metoprolol XL 12.5 QHS - Continue Eliquis 5 bid   PVCs - Zio 2024 with 17% burden - Much improved with switch from amio to mexilitene - TSH WNL - Has not had sleep study, does not seem like a fan of CPAP/BiPAP (just from out discussion although he has never tried it before). Would still do OP sleep study.    HTN - BP stable on current regimen - Switch losartan to Entresto 49/51 mg BID   AKI on CKD stage II - SCr at baseline - likely cardiorenal - follow with diuresis. Cut lasix back. Hold metoalzone   HLD - intolerant of statins - Continue Zetia - On Repatha PTA   Obesity - Body mass index is 45.99 kg/m.  - ideally would use GLP1RA   Tobacco / ETOH use - Smokes 1 PPD - Drinks 40 oz beer on  the weekends - Cessation advised    Length of Stay: 4  Arvilla Meres, MD  05/23/2023, 10:13 AM  Advanced Heart Failure Team Pager [phone] (M-F; 7a - 5p)  Please contact [facility] for night-coverage after hours (5p -7a ) and weekends on [email or web address]

## 2023-05-23 NOTE — Progress Notes (Signed)
 Progress Note   [name] QIO:[id number] DOB: 1975/07/25 DOA: 05/19/2023     4 DOS: the patient was seen and examined on 05/23/2023   Brief hospital course: Taken from H&P.   TENG DECOU is a 48 y.o. male with medical history significant of multivessel CAD, chronic HFrEF with LVEF 15-20 % 2024, ischemic cardiomyopathy, HTN, chronic anticoagulation secondary to CAD and ischemic cardiomyopathy, morbid obesity, presented with worsening of cough shortness of breath and chest pain for about a week.  Patient ran out of his CHF medications including spironolactone, hydralazine, Isordil 2 to 3 weeks ago due to loss of insurance coverage.   On presentation hemodynamically stable on room air, chest x-ray with pulmonary congestion.  Labs seems stable, troponin 571, EKG with NSR and frequent PVCs.  Patient was started on heparin infusion and IV diuresis.  3/8: Vital stable, troponin with a flat trend, BNP 1548, echocardiogram with EF of less than 20% with severely dilated chambers.  Mild to moderate MR. Advanced heart failure team was also consulted.  3/9: Vital stable, UOP was not recorded.  IV Lasix was increased to Q8 hourly by cardiology.  Might need milrinone with Lasix infusion if UOP remains low.  Told nurse again for strict intake and output  3/10: Hemodynamically stable UOP of 2600 recorded, 2 pound decrease in weight.  Renal function stable, mild hyponatremia with sodium at 132 likely with IV Lasix use.  Heart failure team switched heparin infusion with Eliquis, change losartan with Entresto, added metolazone and advising to continue diuresis.  High PVC load on telemetry, home amiodarone is also being switched with mexiletine.  3/11: Remained hemodynamically stable, slightly increase in creatinine so IV Lasix dose was decreased to twice daily and no metolazone today.  Advanced heart failure team is following.  Decreased PVC burden with mexiletine.  Assessment and Plan: * Acute  on chronic combined systolic and diastolic CHF (congestive heart failure) (HCC) History of EF of 15 to 20% with grade 2 diastolic dysfunction, moderate to severe pulmonary hypertension.  Multiple hospitalization due to acute exacerbation.  Patient ran out of his medications due to financial reason and change in insurance. Repeat echocardiogram with less than 20% EF, severely dilated chambers and indeterminate diastolic function. -Continue with IV diuresis-dose decreased to twice daily -No metolazone today -Daily weight and BMP -Strict intake and output -Coreg switched with metoprolol -Losartan switched with Entresto -Spironolactone was added -Cardiology was also consulted -Restarted home Eliquis  NSTEMI (non-ST elevated myocardial infarction) (HCC) Troponin mildly elevated with a flat curve.  Likely demand ischemia with history of significant CAD.  No chest pain now Cardiac cath from last year with severe multivessel disease including 80% mid RCA, 100% RPDA, 100% mid LCx, 80% OM1, 80% mid-distal LAD, with no ideal targets for PCI or surgical revascularization with concomitant non-compaction cardiomyopathy.  Medical management was suggested. History of multiple PVCs, his home Coreg was apparently discontinued for concern of bradycardia last year. Home Coreg is being switched with low-dose metoprolol by cardiology due to his concern of Coreg causing gout -Completed 48 hours of heparin infusion-it is being switched with Eliquis now which is his home med -Continue aspirin, Zetia, nitrate and beta-blocker -Cardiology is on board  PVC's (premature ventricular contractions) Frequent PVCs noted on monitor.  Patient has severely dilated ischemic cardiomyopathy.  PVC burden remained high -Amiodarone is being switched with mexiletine-improved PVC burden with mexiletine -Coreg switched with low-dose metoprolol -Monitor electrolyte closely to keep the potassium more than 4  and magnesium more than  2. -Patient likely need evaluation for AICD  Hypertension Blood pressure currently within goal -Continue with current medications  Hyperlipidemia LDL goal <70 Patient apparently have intolerance to statin, he was on Repatha and unable to take it now due to financial difficulties. -Continue with Zetia -Outpatient pharmacy help to resume Repatha or explore other options  CKD (chronic kidney disease), stage II Renal function currently seems stable. -Monitor renal function closely while patient is being diuresed -Avoid nephrotoxins  Morbid obesity (HCC) Estimated body mass index is 47.5 kg/m as calculated from the following:   Height as of this encounter: 6\' 3"  (1.905 m).   Weight as of this encounter: 172.4 kg.   -This will complicate overall prognosis -Encouraged weight loss   Subjective: Patient was seen and examined today.  Still feeling short of breath.  Physical Exam: Vitals:   05/23/23 0550 05/23/23 0853 05/23/23 1125 05/23/23 1431  BP: (!) 121/94 106/74 103/78 99/76  Pulse: 68 64 80 67  Resp:  20 20 18   Temp: 98.3 F (36.8 C) 98 F (36.7 C) (!) 97.5 F (36.4 C) 97.8 F (36.6 C)  TempSrc:  Oral Oral   SpO2: 94% 94% 97% 95%  Weight:      Height:       General.  Morbidly obese gentleman, in no acute distress. Pulmonary.  Lungs clear bilaterally, normal respiratory effort. CV.  Regular rate and rhythm, no JVD, rub or murmur. Abdomen.  Soft, nontender, nondistended, BS positive. CNS.  Alert and oriented .  No focal neurologic deficit. Extremities.  No edema, no cyanosis, pulses intact and symmetrical. Psychiatry.  Judgment and insight appears normal.   Data Reviewed: Prior data reviewed  Family Communication: Discussed with patient  Disposition: Status is: Inpatient Remains inpatient appropriate because: Severity of illness  Planned Discharge Destination: Home  DVT prophylaxis.  Heparin infusion Time spent: 50 minutes  This record has been created  using Conservation officer, historic buildings. Errors have been sought and corrected,but may not always be located. Such creation errors do not reflect on the standard of care.   Author: Arnetha Courser, MD 05/23/2023 4:00 PM  For on call review [email or web address].

## 2023-05-24 ENCOUNTER — Other Ambulatory Visit: Payer: Self-pay

## 2023-05-24 DIAGNOSIS — I2489 Other forms of acute ischemic heart disease: Secondary | ICD-10-CM

## 2023-05-24 LAB — BASIC METABOLIC PANEL
Anion gap: 11 (ref 5–15)
BUN: 40 mg/dL — ABNORMAL HIGH (ref 6–20)
CO2: 22 mmol/L (ref 22–32)
Calcium: 9.7 mg/dL (ref 8.9–10.3)
Chloride: 98 mmol/L (ref 98–111)
Creatinine, Ser: 1.47 mg/dL — ABNORMAL HIGH (ref 0.61–1.24)
GFR, Estimated: 59 mL/min — ABNORMAL LOW (ref >60)
Glucose, Bld: 103 mg/dL — ABNORMAL HIGH (ref 70–99)
Potassium: 4.2 mmol/L (ref 3.5–5.1)
Sodium: 131 mmol/L — ABNORMAL LOW (ref 135–145)

## 2023-05-24 MED ORDER — SPIRONOLACTONE 25 MG PO TABS
25.0000 mg | ORAL_TABLET | Freq: Every day | ORAL | Status: DC
Start: 2023-05-25 — End: 2023-05-24

## 2023-05-24 MED ORDER — TORSEMIDE 20 MG PO TABS
20.0000 mg | ORAL_TABLET | Freq: Every day | ORAL | 2 refills | Status: DC
  Filled 2023-05-24: qty 90, 90d supply, fill #0
  Filled 2023-08-25: qty 90, 90d supply, fill #1

## 2023-05-24 MED ORDER — ADULT MULTIVITAMIN W/MINERALS CH
1.0000 | ORAL_TABLET | Freq: Every day | ORAL | 2 refills | Status: DC
  Filled 2023-05-24: qty 90, 90d supply, fill #0
  Filled 2023-08-25: qty 90, 90d supply, fill #1

## 2023-05-24 MED ORDER — METOPROLOL SUCCINATE ER 25 MG PO TB24
12.5000 mg | ORAL_TABLET | Freq: Every day | ORAL | 2 refills | Status: DC
  Filled 2023-05-24: qty 45, 90d supply, fill #0
  Filled 2023-06-22: qty 45, 90d supply, fill #1

## 2023-05-24 MED ORDER — MEXILETINE HCL 200 MG PO CAPS
200.0000 mg | ORAL_CAPSULE | Freq: Two times a day (BID) | ORAL | 2 refills | Status: DC
  Filled 2023-05-24: qty 60, 30d supply, fill #0
  Filled 2023-06-22: qty 60, 30d supply, fill #1

## 2023-05-24 MED ORDER — ASPIRIN 81 MG PO CHEW
81.0000 mg | CHEWABLE_TABLET | Freq: Every day | ORAL | 2 refills | Status: DC
  Filled 2023-05-24: qty 90, 90d supply, fill #0

## 2023-05-24 MED ORDER — ELIQUIS 5 MG PO TABS
5.0000 mg | ORAL_TABLET | Freq: Two times a day (BID) | ORAL | 3 refills | Status: DC
  Filled 2023-05-24: qty 180, 90d supply, fill #0

## 2023-05-24 MED ORDER — EZETIMIBE 10 MG PO TABS
10.0000 mg | ORAL_TABLET | Freq: Every day | ORAL | 2 refills | Status: DC
  Filled 2023-05-24: qty 90, 90d supply, fill #0
  Filled 2023-08-25: qty 90, 90d supply, fill #1

## 2023-05-24 MED ORDER — TORSEMIDE 20 MG PO TABS: 20.0000 mg | ORAL_TABLET | Freq: Every day | ORAL | Status: DC

## 2023-05-24 MED ORDER — LOSARTAN POTASSIUM 25 MG PO TABS
25.0000 mg | ORAL_TABLET | Freq: Every day | ORAL | 0 refills | Status: DC
  Filled 2023-05-24: qty 30, 30d supply, fill #0

## 2023-05-24 MED ORDER — VITAMIN B-12 1000 MCG PO TABS
1000.0000 ug | ORAL_TABLET | Freq: Every day | ORAL | 1 refills | Status: DC
  Filled 2023-05-24: qty 90, 90d supply, fill #0
  Filled 2023-08-25: qty 90, 90d supply, fill #1

## 2023-05-24 MED ORDER — SPIRONOLACTONE 25 MG PO TABS
25.0000 mg | ORAL_TABLET | Freq: Every day | ORAL | 1 refills | Status: DC
Start: 2023-05-25 — End: 2023-09-14
  Filled 2023-05-24: qty 90, 90d supply, fill #0
  Filled 2023-08-25: qty 90, 90d supply, fill #1

## 2023-05-24 MED ORDER — EMPAGLIFLOZIN 10 MG PO TABS: 10.0000 mg | ORAL_TABLET | Freq: Every day | ORAL | Status: DC

## 2023-05-24 MED ORDER — EMPAGLIFLOZIN 10 MG PO TABS
10.0000 mg | ORAL_TABLET | Freq: Every day | ORAL | 1 refills | Status: DC
  Filled 2023-05-24: qty 90, 90d supply, fill #0

## 2023-05-24 NOTE — Plan of Care (Signed)

## 2023-05-24 NOTE — Progress Notes (Addendum)
 Advanced Heart Failure Rounding Note  Cardiologist: None  Chief Complaint:  Subjective:    Diuretics cut back yesterday. -3L UOP. Weight down trending.   Scr 1.14 -> 1.38 -> 1.47  PVC much improved with mexilitene   Resting comfortably in bed. Denies CP/SOB. Has been ambulating. Asking about getting out of here.   Objective:   Weight Range: (!) 163.5 kg Body mass index is 45.05 kg/m.   Vital Signs:   Temp:  [97.5 F (36.4 C)-98.6 F (37 C)] 98.3 F (36.8 C) (03/12 0413) Pulse Rate:  [62-80] 62 (03/12 0413) Resp:  [16-20] 16 (03/11 2355) BP: (84-106)/(54-78) 89/57 (03/12 0413) SpO2:  [94 %-97 %] 94 % (03/12 0413) Weight:  [163.5 kg] 163.5 kg (03/12 0456) Last BM Date : 05/22/23  Weight change: Filed Weights   05/23/23 0452 05/23/23 0546 05/24/23 0456  Weight: (!) 166.7 kg (!) 164.2 kg (!) 163.5 kg    Intake/Output:   Intake/Output Summary (Last 24 hours) at 05/24/2023 0734 Last data filed at 05/23/2023 2038 Gross per 24 hour  Intake 1920 ml  Output 2325 ml  Net -405 ml      Physical Exam  General:  well appearing.  No respiratory difficulty HEENT: normal Neck: supple. JVD flat.  Cor: PMI nondisplaced. Regular rate & rhythm. No rubs, gallops or murmurs. Lungs: clear Abdomen: soft, nontender, nondistended. Good bowel sounds. Extremities: no cyanosis, clubbing, rash, edema. + ted hose Neuro: alert & oriented x 3. Moves all 4 extremities w/o difficulty. Affect pleasant.   Telemetry   NSR 60s 1-7 PVCs/min (Personally reviewed)    Labs    CBC Recent Labs    05/22/23 0439 05/23/23 0434  WBC 8.7 7.3  HGB 13.5 14.4  HCT 41.9 45.2  MCV 81.2 81.6  PLT 216 239   Basic Metabolic Panel Recent Labs    40/98/11 0439 05/23/23 0434 05/24/23 0552  NA 132* 131* 131*  K 3.8 4.3 4.2  CL 100 99 98  CO2 22 21* 22  GLUCOSE 91 95 103*  BUN 27* 34* 40*  CREATININE 1.14 1.38* 1.47*  CALCIUM 8.9 9.2 9.7  MG  --  2.1  --   PHOS 3.6  --   --    Liver  Function Tests Recent Labs    05/22/23 0439  ALBUMIN 3.4*   No results for input(s): "LIPASE", "AMYLASE" in the last 72 hours. Cardiac Enzymes No results for input(s): "CKTOTAL", "CKMB", "CKMBINDEX", "TROPONINI" in the last 72 hours.  BNP: BNP (last 3 results) Recent Labs    05/19/23 1146  BNP 1,548.3*    ProBNP (last 3 results) No results for input(s): "PROBNP" in the last 8760 hours.   D-Dimer No results for input(s): "DDIMER" in the last 72 hours. Hemoglobin A1C No results for input(s): "HGBA1C" in the last 72 hours. Fasting Lipid Panel Recent Labs    05/23/23 0434  CHOL 142  HDL 24*  LDLCALC 93  TRIG 914  CHOLHDL 5.9   Thyroid Function Tests No results for input(s): "TSH", "T4TOTAL", "T3FREE", "THYROIDAB" in the last 72 hours.  Invalid input(s): "FREET3"  Other results:   Imaging    No results found.   Medications:     Scheduled Medications:  allopurinol  100 mg Oral Daily   apixaban  5 mg Oral BID   aspirin  81 mg Oral Daily   [START ON 05/25/2023] empagliflozin  10 mg Oral Daily   ezetimibe  10 mg Oral QHS   guaiFENesin  1,200 mg Oral BID   metoprolol succinate  12.5 mg Oral Daily   mexiletine  200 mg Oral Q12H   sodium chloride flush  3 mL Intravenous Q12H   [START ON 05/25/2023] spironolactone  25 mg Oral Daily   [START ON 05/25/2023] torsemide  20 mg Oral Daily    Infusions:   PRN Medications: acetaminophen, ipratropium-albuterol, ondansetron (ZOFRAN) IV, sodium chloride flush     Assessment/Plan   Acute on chronic systolic heart failure, iCM - Mentions of compaction CM from [facility] cards notes. On Eliquis 5 mg BID (currently on hep gtt) - RHC 5/24: Normal LV and RV filling pressures. Mild pulmonary hypertension with mild elevation of PVL. Low cardiac index 1.8  - Echo this admission with EF <20%, LV with GHK, LV severely dilated, RV mod reduced, LA severely dilated, mild-mod MR - NYHA IV on admission - Hold diuretics today, appears  euvolemic to mildly hypovolemic. Renal function elevated. Weight down trending. -3L UOP yesterday with lasix 80 IV BID. Would send home with Torsemide 20 mg daily to start tomorrow. Can take additional 20 mg PRN for weight gain (>3 lbs overnight or >5 lbs in 1 week) - Stop hydral/isordil as he is likely not going to comply with tid dosing focus on optimizing other GDMT for now  - [employer] today, restart tomorrow - Hold Entresto today with soft BP. Will switch to losartan at discharge (not yet approved for patient assistance) - Continue Toprol 12.5 - Hold Jardiance today, restart tomorrow - Strict I&O, daily weights - Long talk about possible need for advanced therapies in the not too distant future but says he has been told this before and said he didn't have the money for a new heart and "we all got to die some time."  - Currently not candidate for advanced therapies    CAD - LHC 5/24: Severe multivessel CAD including 80% mid RCA, 100% RPDA, 100% mid LCx, 80% OM1, 80% mid-distal LAD.  - Medical management recommended at [facility] cards 5/24 - HsTrop 571>573. No evidence ACS. Likely demand ischemia from HF - Continue Zetia, ASA - Continue Metoprolol XL 12.5 QHS - Continue Eliquis 5 bid   PVCs - Zio 2024 with 17% burden - Much improved with switch from amio to mexilitene - TSH WNL - Has not had sleep study, does not seem like a fan of CPAP/BiPAP (just from out discussion although he has never tried it before). Would still do OP sleep study.    HTN - BP soft today. Suspect 2/2 over diuresis - Hold Entresto today.    AKI on CKD stage II - SCr slightly elevated - likely cardiorenal - holding diuretics and GDMT   HLD - intolerant of statins - Continue Zetia - On Repatha PTA   Obesity - Body mass index is 45.99 kg/m.  - ideally would use GLP1RA   Tobacco / ETOH use - Smokes 1 PPD - Drinks 40 oz beer on the weekends - Cessation advised   Will arrange f/u in AHF clinic.   AHF  meds at discharge:  Eliquis 5 mg BID ASA 81 mg daily Jardiance 10 mg daily (to start tomorrow) Losartan 25 mg daily (to start tomorrow) Zetia 10 mg at bedtime  Toprol XL 12.5 mg daily  Mexiletine 200 mg Q12 Spironolactone 25 mg daily (to start tomorrow) Torsemide 20 mg daily   Length of Stay: 5  Alen Bleacher, NP  05/24/2023, 7:34 AM  Advanced Heart Failure Team Pager [phone] (M-F;  7a - 5p)  Please contact [facility] for night-coverage after hours (5p -7a ) and weekends on [email or web address]    Agree with above.   Diuresed well again over night. Out 3L Weight down another 2 pounds. Denies SOB anxious to go home No CP or SOB BP soft but not symptomatic and Scr stable.   On exam volume status much improved and lungs are clear  We have made med changes and reviewed meds with him closely. PharmD has educated on meds and made a med f/u plan with him  We have arranged f/u with him for Friday with me in Clinic (he is out of town next 2 weeks so we did nt want to wait until he came back).   D/w TRH, Of to d/c from our standpoint.   Arvilla Meres, MD  8:41 AM

## 2023-05-24 NOTE — Discharge Summary (Signed)
 Physician Discharge Summary   [name] MRN: [id number] DOB: 30-Jun-1975  Admit date:     05/19/2023  Discharge date: 05/24/23  Discharge Physician: Arnetha Courser   PCP: [facility]   Recommendations at discharge:  Please obtain CBC and BMP on follow-up Follow-up with heart failure clinic  Discharge Diagnoses: Principal Problem:   Acute on chronic combined systolic and diastolic CHF (congestive heart failure) (HCC) Active Problems:   NSTEMI (non-ST elevated myocardial infarction) (HCC)   PVC's (premature ventricular contractions)   Hypertension   Hyperlipidemia LDL goal <70   CKD (chronic kidney disease), stage II   Morbid obesity (HCC)   Acute on chronic congestive heart failure (HCC)   Demand ischemia (HCC)   Elevated troponin   Ischemic cardiomyopathy   Hospital Course: Taken from H&P.   Philip Monroe is a 48 y.o. male with medical history significant of multivessel CAD, chronic HFrEF with LVEF 15-20 % 2024, ischemic cardiomyopathy, HTN, chronic anticoagulation secondary to CAD and ischemic cardiomyopathy, morbid obesity, presented with worsening of cough shortness of breath and chest pain for about a week.  Patient ran out of his CHF medications including spironolactone, hydralazine, Isordil 2 to 3 weeks ago due to loss of insurance coverage.   On presentation hemodynamically stable on room air, chest x-ray with pulmonary congestion.  Labs seems stable, troponin 571, EKG with NSR and frequent PVCs.  Patient was started on heparin infusion and IV diuresis.  3/8: Vital stable, troponin with a flat trend, BNP 1548, echocardiogram with EF of less than 20% with severely dilated chambers.  Mild to moderate MR. Advanced heart failure team was also consulted.  3/9: Vital stable, UOP was not recorded.  IV Lasix was increased to Q8 hourly by cardiology.  Might need milrinone with Lasix infusion if UOP remains low.  Told nurse again for strict  intake and output  3/10: Hemodynamically stable UOP of 2600 recorded, 2 pound decrease in weight.  Renal function stable, mild hyponatremia with sodium at 132 likely with IV Lasix use.  Heart failure team switched heparin infusion with Eliquis, change losartan with Entresto, added metolazone and advising to continue diuresis.  High PVC load on telemetry, home amiodarone is also being switched with mexiletine.  3/11: Remained hemodynamically stable, slightly increase in creatinine so IV Lasix dose was decreased to twice daily and no metolazone today.  Advanced heart failure team is following.  Decreased PVC burden with mexiletine.  3/12: Hemodynamically stable but blood pressure little soft.  Increasing creatinine so holding further IV diuresis and other medications today.  Heart failure team is recommending starting low-dose torsemide 20 mg daily, decreasing the dose of losartan to 25 mg as he is still waiting for approval of Entresto and other medications from tomorrow.  They will follow-up very closely as outpatient for further recommendations.  An appointment was provided for this upcoming Friday with Dr. Gala Romney.  Patient will continue on current medications and need to have a close follow-up with his providers.  Assessment and Plan: * Acute on chronic combined systolic and diastolic CHF (congestive heart failure) (HCC) History of EF of 15 to 20% with grade 2 diastolic dysfunction, moderate to severe pulmonary hypertension.  Multiple hospitalization due to acute exacerbation.  Patient ran out of his medications due to financial reason and change in insurance. Repeat echocardiogram with less than 20% EF, severely dilated chambers and indeterminate diastolic function. Patient diuresed well with IV Lasix, increase in creatinine so holding further  IV diuresis and he will take torsemide 20 mg daily from tomorrow.  NSTEMI (non-ST elevated myocardial infarction) (HCC) Troponin mildly elevated with a  flat curve.  Likely demand ischemia with history of significant CAD.  No chest pain now Cardiac cath from last year with severe multivessel disease including 80% mid RCA, 100% RPDA, 100% mid LCx, 80% OM1, 80% mid-distal LAD, with no ideal targets for PCI or surgical revascularization with concomitant non-compaction cardiomyopathy.  Medical management was suggested. History of multiple PVCs, his home Coreg was apparently discontinued for concern of bradycardia last year. Home Coreg is being switched with low-dose metoprolol by cardiology due to his concern of Coreg causing gout -Completed 48 hours of heparin infusion-it is being switched with Eliquis now which is his home med -Continue aspirin, Zetia, nitrate and beta-blocker -Cardiology is on board  PVC's (premature ventricular contractions) Frequent PVCs noted on monitor.  Patient has severely dilated ischemic cardiomyopathy.  PVC burden remained high -Amiodarone is being switched with mexiletine-improved PVC burden with mexiletine -Coreg switched with low-dose metoprolol -Monitor electrolyte closely to keep the potassium more than 4 and magnesium more than 2. -Patient likely need evaluation for AICD  Hypertension Blood pressure little soft this morning so holding antihypertensives today.  Hyperlipidemia LDL goal <70 Patient apparently have intolerance to statin, he was on Repatha and unable to take it now due to financial difficulties. -Continue with Zetia -Outpatient pharmacy help to resume Repatha or explore other options  CKD (chronic kidney disease), stage II Mild AKI today with increase in creatinine due to IV diuresis which has been held today. -Monitor renal function closely while patient is being diuresed -Avoid nephrotoxins  Morbid obesity (HCC) Estimated body mass index is 47.5 kg/m as calculated from the following:   Height as of this encounter: 6\' 3"  (1.905 m).   Weight as of this encounter: 172.4 kg.   -This will  complicate overall prognosis -Encouraged weight loss  Consultants: Cardiology Procedures performed: None Disposition: Home Diet recommendation:  Discharge Diet Orders (From admission, onward)     Start     Ordered   05/24/23 0000  Diet - low sodium heart healthy        05/24/23 0943           Cardiac diet DISCHARGE MEDICATION: Allergies as of 05/24/2023       Reactions   Lactose Intolerance (gi) Diarrhea   Statins    myalgias        Medication List     STOP taking these medications    aspirin 325 MG tablet Replaced by: aspirin 81 MG chewable tablet   furosemide 20 MG tablet Commonly known as: LASIX   hydrALAZINE 10 MG tablet Commonly known as: APRESOLINE   isosorbide dinitrate 10 MG tablet Commonly known as: ISORDIL       TAKE these medications    aspirin 81 MG chewable tablet Chew 1 tablet (81 mg total) by mouth daily. Replaces: aspirin 325 MG tablet   cyanocobalamin 1000 MCG tablet Commonly known as: VITAMIN B12 Take 1 tablet (1,000 mcg total) by mouth daily.   Eliquis 5 MG Tabs tablet Generic drug: apixaban Take 1 tablet (5 mg total) by mouth 2 (two) times daily.   empagliflozin 10 MG Tabs tablet Commonly known as: JARDIANCE Take 1 tablet (10 mg total) by mouth daily. Start taking on: May 25, 2023   ezetimibe 10 MG tablet Commonly known as: ZETIA Take 1 tablet (10 mg total) by mouth at bedtime.  losartan 25 MG tablet Commonly known as: COZAAR Take 1 tablet (25 mg total) by mouth daily. What changed:  medication strength how much to take   metoprolol succinate 25 MG 24 hr tablet Commonly known as: TOPROL-XL Take 0.5 tablets (12.5 mg total) by mouth daily.   mexiletine 200 MG capsule Commonly known as: MEXITIL Take 1 capsule (200 mg total) by mouth every 12 (twelve) hours.   multivitamin with minerals Tabs tablet Take 1 tablet by mouth daily.   spironolactone 25 MG tablet Commonly known as: ALDACTONE Take 1 tablet (25 mg  total) by mouth daily. Start taking on: May 25, 2023   torsemide 20 MG tablet Commonly known as: DEMADEX Take 1 tablet (20 mg total) by mouth daily. Start taking on: May 25, 2023 What changed:  when to take this additional instructions        Follow-up Information     [facility]. Go on 05/26/2023.   Specialty: Cardiology Why: Hospital Follow-Up 05/26/23 @ 9:45 AM Please bring all medications to follow-up appointment with you to appointment [facility], [address] Floor Free Valet Parking @ the door Contact information: [address] [place] [address] [phone]               Discharge Exam: Ceasar Mons Weights   05/23/23 0546 05/24/23 0456 05/24/23 0920  Weight: (!) 164.2 kg (!) 163.5 kg (!) 162.9 kg   General.  Morbidly obese gentleman, in no acute distress. Pulmonary.  Lungs clear bilaterally, normal respiratory effort. CV.  Regular rate and rhythm, no JVD, rub or murmur. Abdomen.  Soft, nontender, nondistended, BS positive. CNS.  Alert and oriented .  No focal neurologic deficit. Extremities.  No edema, no cyanosis, pulses intact and symmetrical. Psychiatry.  Judgment and insight appears normal.   Condition at discharge: stable  The results of significant diagnostics from this hospitalization (including imaging, microbiology, ancillary and laboratory) are listed below for reference.   Imaging Studies: ECHOCARDIOGRAM COMPLETE Result Date: 05/20/2023    ECHOCARDIOGRAM REPORT   Patient Name:   Philip Monroe Date of Exam: 05/20/2023 Medical Rec #:  [id number]      Height:       75.0 in Accession #:    [id number]     Weight:       380.0 lb Date of Birth:  July 05, 1975      BSA:          2.883 m Patient Age:    48 years       BP:           128/80 mmHg Patient Gender: M              HR:           66 bpm. Exam Location:  [facility] Procedure: 2D Echo, Cardiac Doppler, Color Doppler and  Intracardiac            Opacification Agent (Both Spectral and Color Flow Doppler were            utilized during procedure). Indications:     CHF I50.21  History:         Patient has no prior history of Echocardiogram examinations.  Sonographer:     Overton Mam RDCS, FASE Referring Phys:  [id number] Antonieta Iba Diagnosing Phys: Julien Nordmann MD  Sonographer Comments: Technically difficult study due to poor echo windows and patient is obese. Image acquisition challenging due to patient  body habitus and Image acquisition challenging due to respiratory motion. IMPRESSIONS  1. Left ventricular ejection fraction, by estimation, is <20%. Left ventricular ejection fraction by 2D MOD biplane is 19.2 %. The left ventricle has severely decreased function. The left ventricle demonstrates global hypokinesis. The left ventricular internal cavity size was severely dilated. Left ventricular diastolic parameters are indeterminate.  2. Right ventricular systolic function is moderately reduced. The right ventricular size is normal.  3. Left atrial size was severely dilated.  4. The mitral valve is normal in structure. Mild to moderate mitral valve regurgitation. No evidence of mitral stenosis.  5. The aortic valve is normal in structure. Aortic valve regurgitation is not visualized. No aortic stenosis is present.  6. The inferior vena cava is normal in size with greater than 50% respiratory variability, suggesting right atrial pressure of 3 mmHg. FINDINGS  Left Ventricle: Left ventricular ejection fraction, by estimation, is <20%. Left ventricular ejection fraction by 2D MOD biplane is 19.2 %. The left ventricle has severely decreased function. The left ventricle demonstrates global hypokinesis. Definity contrast agent was given IV to delineate the left ventricular endocardial borders. Global longitudinal strain performed but not reported based on interpreter judgement due to suboptimal tracking. The left ventricular internal  cavity size was severely dilated. There is no left ventricular hypertrophy. Left ventricular diastolic parameters are indeterminate. Right Ventricle: The right ventricular size is normal. No increase in right ventricular wall thickness. Right ventricular systolic function is moderately reduced. Left Atrium: Left atrial size was severely dilated. Right Atrium: Right atrial size was normal in size. Pericardium: There is no evidence of pericardial effusion. Mitral Valve: The mitral valve is normal in structure. Mild to moderate mitral valve regurgitation. No evidence of mitral valve stenosis. Tricuspid Valve: The tricuspid valve is normal in structure. Tricuspid valve regurgitation is not demonstrated. No evidence of tricuspid stenosis. Aortic Valve: The aortic valve is normal in structure. Aortic valve regurgitation is not visualized. No aortic stenosis is present. Aortic valve peak gradient measures 8.5 mmHg. Pulmonic Valve: The pulmonic valve was normal in structure. Pulmonic valve regurgitation is not visualized. No evidence of pulmonic stenosis. Aorta: The aortic root is normal in size and structure. Venous: The inferior vena cava is normal in size with greater than 50% respiratory variability, suggesting right atrial pressure of 3 mmHg. IAS/Shunts: No atrial level shunt detected by color flow Doppler. Additional Comments: 3D was performed not requiring image post processing on an independent workstation and was indeterminate.  LEFT VENTRICLE PLAX 2D                        Biplane EF (MOD) LVIDd:         8.90 cm         LV Biplane EF:   Left LVIDs:         7.80 cm                          ventricular LV PW:         1.10 cm                          ejection LV IVS:        1.30 cm                          fraction by LVOT diam:  2.40 cm                          2D MOD LV SV:         84                               biplane is LV SV Index:   29                               19.2 %. LVOT Area:     4.52 cm                                 Diastology                                LV e' medial:    4.35 cm/s LV Volumes (MOD)               LV E/e' medial:  22.0 LV vol d, MOD    587.0 ml      LV e' lateral:   6.64 cm/s A2C:                           LV E/e' lateral: 14.4 LV vol d, MOD    643.0 ml A4C: LV vol s, MOD    467.0 ml A2C: LV vol s, MOD    511.0 ml A4C: LV SV MOD A2C:   120.0 ml LV SV MOD A4C:   643.0 ml LV SV MOD BP:    119.3 ml RIGHT VENTRICLE RV Basal diam:  2.90 cm RV S prime:     11.20 cm/s TAPSE (M-mode): 2.1 cm LEFT ATRIUM            Index        RIGHT ATRIUM           Index LA diam:      6.00 cm  2.08 cm/m   RA Area:     20.90 cm LA Vol (A2C): 168.0 ml 58.28 ml/m  RA Volume:   60.70 ml  21.06 ml/m LA Vol (A4C): 150.0 ml 52.04 ml/m  AORTIC VALVE                 PULMONIC VALVE AV Area (Vmax): 3.22 cm     PV Vmax:        1.22 m/s AV Vmax:        146.00 cm/s  PV Peak grad:   6.0 mmHg AV Peak Grad:   8.5 mmHg     RVOT Peak grad: 3 mmHg LVOT Vmax:      104.00 cm/s LVOT Vmean:     64.400 cm/s LVOT VTI:       0.185 m  AORTA Ao Root diam: 4.15 cm Ao Asc diam:  3.50 cm MITRAL VALVE MV Area (PHT): 2.46 cm       SHUNTS MV Decel Time: 308 msec       Systemic VTI:  0.18 m MR Peak grad:    86.1 mmHg    Systemic Diam: 2.40 cm MR Mean grad:    53.0 mmHg MR Vmax:         464.00  cm/s MR Vmean:        344.0 cm/s MR PISA:         4.02 cm MR PISA Eff ROA: 30 mm MR PISA Radius:  0.80 cm MV E velocity: 95.90 cm/s MV A velocity: 49.30 cm/s MV E/A ratio:  1.95 Julien Nordmann MD Electronically signed by Julien Nordmann MD Signature Date/Time: 05/20/2023/12:16:34 PM    Final    DG Chest 2 View Result Date: 05/19/2023 CLINICAL DATA:  One-week history of shortness of breath and productive cough EXAM: CHEST - 2 VIEW COMPARISON:  Chest radiograph dated 07/24/2017 FINDINGS: Normal lung volumes. Diffuse bilateral interstitial opacities. No pleural effusion or pneumothorax. Similar markedly enlarged cardiomediastinal silhouette. No acute  osseous abnormality. IMPRESSION: Findings of pulmonary edema and marked cardiomegaly. Electronically Signed   By: Agustin Cree M.D.   On: 05/19/2023 15:18    Microbiology: No results found for this or any previous visit.  Labs: CBC: Recent Labs  Lab 05/19/23 1146 05/20/23 0506 05/21/23 0453 05/22/23 0439 05/23/23 0434  WBC 7.0 6.9 7.8 8.7 7.3  HGB 13.6 12.7* 13.1 13.5 14.4  HCT 42.8 39.4 41.7 41.9 45.2  MCV 82.1 81.9 82.6 81.2 81.6  PLT 228 206 218 216 239   Basic Metabolic Panel: Recent Labs  Lab 05/19/23 1146 05/19/23 1349 05/20/23 0506 05/21/23 0453 05/22/23 0439 05/23/23 0434 05/24/23 0552  NA 134*  --  135 133* 132* 131* 131*  K 3.7  --  3.7 3.4* 3.8 4.3 4.2  CL 103  --  101 100 100 99 98  CO2 21*  --  21* 24 22 21* 22  GLUCOSE 114*  --  102* 89 91 95 103*  BUN 21*  --  23* 26* 27* 34* 40*  CREATININE 1.06  --  1.09 1.13 1.14 1.38* 1.47*  CALCIUM 8.9  --  8.7* 8.6* 8.9 9.2 9.7  MG 1.9  --   --   --   --  2.1  --   PHOS  --  3.5  --   --  3.6  --   --    Liver Function Tests: Recent Labs  Lab 05/19/23 1146 05/22/23 0439  AST 20  --   ALT 20  --   ALKPHOS 54  --   BILITOT 1.8*  --   PROT 7.4  --   ALBUMIN 3.5 3.4*   CBG: No results for input(s): "GLUCAP" in the last 168 hours.  Discharge time spent: greater than 30 minutes.  This record has been created using Conservation officer, historic buildings. Errors have been sought and corrected,but may not always be located. Such creation errors do not reflect on the standard of care.   Signed: Arnetha Courser, MD [facility] 05/24/2023

## 2023-05-25 ENCOUNTER — Telehealth: Payer: Self-pay | Admitting: Internal Medicine

## 2023-05-25 NOTE — Telephone Encounter (Signed)
 Pt confirmed appt on 05/26/23

## 2023-05-26 ENCOUNTER — Encounter: Payer: Self-pay | Admitting: Internal Medicine

## 2023-05-26 ENCOUNTER — Other Ambulatory Visit
Admission: RE | Admit: 2023-05-26 | Discharge: 2023-05-26 | Disposition: A | Discharge status: Home / Self Care | Payer: Self-pay | Source: Ambulatory Visit | Attending: Internal Medicine | Admitting: Internal Medicine

## 2023-05-26 ENCOUNTER — Encounter: Admitting: Family

## 2023-05-26 ENCOUNTER — Ambulatory Visit: Payer: Self-pay | Admitting: Internal Medicine

## 2023-05-26 VITALS — BP 103/77 | HR 69 | Ht 75.0 in | Wt 359.0 lb

## 2023-05-26 DIAGNOSIS — Z720 Tobacco use: Secondary | ICD-10-CM

## 2023-05-26 DIAGNOSIS — I2510 Atherosclerotic heart disease of native coronary artery without angina pectoris: Secondary | ICD-10-CM

## 2023-05-26 DIAGNOSIS — I10 Essential (primary) hypertension: Secondary | ICD-10-CM

## 2023-05-26 DIAGNOSIS — I5022 Chronic systolic (congestive) heart failure: Secondary | ICD-10-CM | POA: Insufficient documentation

## 2023-05-26 DIAGNOSIS — I493 Ventricular premature depolarization: Secondary | ICD-10-CM

## 2023-05-26 LAB — BASIC METABOLIC PANEL
Anion gap: 13 (ref 5–15)
BUN: 47 mg/dL — ABNORMAL HIGH (ref 6–20)
CO2: 21 mmol/L — ABNORMAL LOW (ref 22–32)
Calcium: 10.1 mg/dL (ref 8.9–10.3)
Chloride: 100 mmol/L (ref 98–111)
Creatinine, Ser: 1.88 mg/dL — ABNORMAL HIGH (ref 0.61–1.24)
GFR, Estimated: 44 mL/min — ABNORMAL LOW (ref >60)
Glucose, Bld: 131 mg/dL — ABNORMAL HIGH (ref 70–99)
Potassium: 4.3 mmol/L (ref 3.5–5.1)
Sodium: 134 mmol/L — ABNORMAL LOW (ref 135–145)

## 2023-05-26 LAB — BRAIN NATRIURETIC PEPTIDE: B Natriuretic Peptide: 204.2 pg/mL — ABNORMAL HIGH (ref 0.0–100.0)

## 2023-05-26 NOTE — Progress Notes (Signed)
 ADVANCED HF CLINIC NOTE  Chief Complaint: Heart failure  HPI:  Philip Monroe is a 48 y.o. AAM with multivessel CAD, chronic HFrEF, iCM, noncompaction CM, HTN, hx ETOH use, noncompliance and morbid obesity.    Has been followed by [facility] cardiology for iCM/noncompaction CM.  RHC 5/24: Normal LV and RV filling pressures. Mild pulmonary hypertension with mild elevation of PVL. Low cardiac index 1.8. LHC 5/24: Severe multivessel CAD including 80% mid RCA, 100% RPDA, 100% mid LCx, 80% OM1, 80% mid-distal LAD. Medically managed.    He presented to [facility] ED in 3/25 with progressive HF symptoms after being noncompliant with meds. Echo EF <20%, LV with GHK, LV severely dilated, RV mod reduced, LA severely dilated, mild-mod MR. Diuresed well with IV lasix. GDMT restarted. D/c weight 360 pounds   Here for post hospital f/u. Diuresed well in hospital. Feels way better. Legs not swelling but still tender. No CP. Compliant with meds Weight down another pound. Cut back to 5 cigs/day. About to go to [place] for 2 weeks for his job.   ROS: All systems negative except as listed in HPI, PMH and Problem List.  SH:  Social History   Socioeconomic History   Marital status: Single    Spouse name: Not on file   Number of children: 4   Years of education: Not on file   Highest education level: 10th grade  Occupational History   Not on file  Tobacco Use   Smoking status: Some Days    Current packs/day: 0.00    Types: Cigarettes    Last attempt to quit: 03/20/2015    Years since quitting: 8.1   Smokeless tobacco: Never  Vaping Use   Vaping status: Never Used  Substance and Sexual Activity   Alcohol use: Yes    Alcohol/week: 36.0 standard drinks of alcohol    Types: 36 Cans of beer per week    Comment: 18 cans of beer on Friday and again on Saturday nights   Drug use: No   Sexual activity: Yes    Birth control/protection: Coitus interruptus, Condom  Other Topics Concern   Not on file  Social  History Narrative   Not on file   Social Drivers of Health   Financial Resource Strain: Medium Risk (05/22/2023)   Overall Financial Resource Strain (CARDIA)    Difficulty of Paying Living Expenses: Somewhat hard  Food Insecurity: No Food Insecurity (05/20/2023)   Hunger Vital Sign    Worried About Running Out of Food in the Last Year: Never true    Ran Out of Food in the Last Year: Never true  Transportation Needs: No Transportation Needs (05/22/2023)   PRAPARE - Administrator, Civil Service (Medical): No    Lack of Transportation (Non-Medical): No  Physical Activity: Not on file  Stress: Not on file  Social Connections: Not on file  Intimate Partner Violence: Not At Risk (05/20/2023)   Humiliation, Afraid, Rape, and Kick questionnaire    Fear of Current or Ex-Partner: No    Emotionally Abused: No    Physically Abused: No    Sexually Abused: No    FH: History reviewed. No pertinent family history.  Past Medical History:  Diagnosis Date   Aortic root dilatation (HCC)    a. 07/2022 Echo: Ao root 4.4cm, Asc Ao 4.0cm.   CAD (coronary artery disease)    a. 08/2017 Cath: LM nl, LAD 74m/d, D1 70ost, LCX 111m, OM1 90 inf branch, 60 sup branch,  OM2 small, RCA 30 diffuse, RPDA 100 w/ L->R collats; b. 07/2022 Echo: LM nl, LAD 57m, D1 80, LCX 196m, OM1 80, RCA 37m, RPDA 100-->No targets for PCI/CABG-->Med rx.   Chronic HFrEF (heart failure with reduced ejection fraction) (HCC)    a. 03/2015 Echo: EF 35-40%; b. 08/2017 Echo: EF 15%; c. 08/2017 cMRI: EF 35%; d. 06/2018 Echo: EF 10-15%; e. 07/2022 Echo: EF 15-20%, grIII DD, mild MR, sev dil LA, mod-sev PH, Ao root 4.4cm, Asc Ao 4.0cm; f. 07/2022 RHC: RA 4, RV 38/3, PA 43/18 (26), PCWP 11. CO/CI 5.3/1.8.   CKD (chronic kidney disease), stage II    Gout    Hyperlipidemia LDL goal <70    Hypertension    Ischemic cardiomyopathy    a. 03/2015 Echo: EF 35-40%; b. 08/2017 Echo: EF 15%; c. 08/2017 cMRI: EF 35%; d. 06/2018 Echo: EF 10-15%; e. 07/2022  Echo: EF 15-20%, grIII DD.   IVCD (intraventricular conduction defect)    Left ventricular noncompaction [facility])    Morbid obesity (HCC)    NSVT (nonsustained ventricular tachycardia) (HCC)    a. 09/2022 Event monitor: predominantly sinus rhythm, avg HR 81 (46-167). 17 runs NSVT (fastest/longest 13 beats x 167). Rare PACs, 17.5% PVC burden.   PVC's (premature ventricular contractions)    a. 09/2022 Event Monitor: 17.5% PVC burden.   Statin intolerance    Tobacco abuse     Current Outpatient Medications  Medication Sig Dispense Refill   aspirin 81 MG chewable tablet Chew 1 tablet (81 mg total) by mouth daily. 90 tablet 2   cyanocobalamin (VITAMIN B12) 1000 MCG tablet Take 1 tablet (1,000 mcg total) by mouth daily. 90 tablet 1   ELIQUIS 5 MG TABS tablet Take 1 tablet (5 mg total) by mouth 2 (two) times daily. 180 tablet 3   empagliflozin (JARDIANCE) 10 MG TABS tablet Take 1 tablet (10 mg total) by mouth daily. 90 tablet 1   ezetimibe (ZETIA) 10 MG tablet Take 1 tablet (10 mg total) by mouth at bedtime. 90 tablet 2   losartan (COZAAR) 25 MG tablet Take 1 tablet (25 mg total) by mouth daily. 30 tablet 0   metoprolol succinate (TOPROL-XL) 25 MG 24 hr tablet Take 0.5 tablets (12.5 mg total) by mouth daily. 45 tablet 2   mexiletine (MEXITIL) 200 MG capsule Take 1 capsule (200 mg total) by mouth every 12 (twelve) hours. 60 capsule 2   Multiple Vitamin (MULTIVITAMIN WITH MINERALS) TABS tablet Take 1 tablet by mouth daily. 90 tablet 2   spironolactone (ALDACTONE) 25 MG tablet Take 1 tablet (25 mg total) by mouth daily. 90 tablet 1   torsemide (DEMADEX) 20 MG tablet Take 1 tablet (20 mg total) by mouth daily. 90 tablet 2   No current facility-administered medications for this visit.    Vitals:   05/26/23 0939  BP: 103/77  Pulse: 69  SpO2: 95%  Weight: (!) 359 lb (162.8 kg)  Height: 6\' 3"  (1.905 m)    PHYSICAL EXAM: General:  obese male . No resp difficulty HEENT: normal Neck: supple. no  JVD. Carotids 2+ bilat; no bruits. No lymphadenopathy or thryomegaly appreciated. Cor: Regular rate & rhythm. No rubs, gallops or murmurs. Lungs: clear Abdomen: obese soft, nontender, nondistended. No hepatosplenomegaly. No bruits or masses. Good bowel sounds. Extremities: no cyanosis, clubbing, rash, edema Neuro: alert & orientedx3, cranial nerves grossly intact. moves all 4 extremities w/o difficulty. Affect pleasant  ASSESSMENT & PLAN:  1. Chronic systolic heart failure, likely combine iCM/NICM -  Mentions of compaction CM from [facility] cards [facility]. - RHC 5/24: Normal LV and RV filling pressures. Mild pulmonary hypertension with mild elevation of PVL. Low cardiac index 1.8  - Echo 3/25 EF <20%, LV with GHK, LV severely dilated, RV mod reduced, LA severely dilated, mild-mod MR - Improved NYHA II-III - Volume status ok On good GDMT - Continue Jardiance 10 mg daily  - Continue Losartan 25 mg daily  - Continue Toprol XL 12.5 mg daily  - Continue Spironolactone 25 mg daily (to start tomorrow) - Continue Torsemide 20 mg daily - Long talk about need for compliance with meds and to cut back on ETOH and tobacco - We discussed possible need for advanced therapies down the road with particular attention to VAD given his size. He said he would consider if needed but realizes there are barriers currently to get there - Check labs today - Will have him f/u closely in HF Clinic to titrate meds. If EF not improving he will need ICD - I am hopeful that EF may improve some with suppression of PVCs.    2. CAD - LHC 5/24: Severe multivessel CAD including 80% mid RCA, 100% RPDA, 100% mid LCx, 80% OM1, 80% mid-distal LAD.  - Medical management recommended at [facility] cards 5/24 - No current s/s angina - Continue Zetia, ASA - Continue Metoprolol XL 12.5 QHS - Continue Eliquis 5 bid   3. PVCs - Zio 2024 with 17% burden - Much improved with switch from amio to mexilitene - Will  need sleep study when ha has  insurance - Repeat Zio in near future to reassess PVC burden on mexilitene  4. HTN - Blood pressure well controlled. Continue current regimen.  5. HL - intolerant of statins - Continue Zetia - On Repatha PTA   6. Morbid obesity - Body mass index is 44.87 kg/m. - ideally would use GLP1RA but does not have insurance   7. Tobacco / ETOH use - has cut smoking down 1ppd -> 5 cigs/day - Drinks 40 oz beer on the weekends - Discussed need for cessation to permit advanced therapy consideration  8. OSA - needs sleep study - doesn't have insurance - applying for Medicaid (aplication submitted)  I spent a total of 45 minutes today: 1) reviewing the patient's medical records including previous charts, labs and recent notes from other providers; 2) examining the patient and counseling them on their medical issues/explaining the plan of care; 3) adjusting meds as needed and 4) ordering lab work or other needed tests.    Arvilla Meres, MD  10:17 AM

## 2023-05-26 NOTE — Patient Instructions (Signed)
 Lab Work:  Go over to the MEDICAL MALL. Go pass the gift shop and have your blood work completed.  We will only call you if the results are abnormal or if the provider would like to make medication changes.   Follow-Up in: 3 weeks with [name], FNP  At the Advanced [facility] Failure Clinic, you and your health needs are our priority. We have a designated team specialized in the treatment of Heart Failure. This Care Team includes your primary Heart Failure Specialized Cardiologist (physician), Advanced Practice Providers (APPs- Physician Assistants and Nurse Practitioners), and Pharmacist who all work together to provide you with the care you need, when you need it.   You may see any of the following providers on your designated Care Team at your next follow up:  Dr. Arvilla Meres Dr. Marca Ancona Dr. Dorthula Nettles Dr. Theresia Bough [name], FNP Enos Fling, RPH-CPP  Please be sure to bring in all your medications bottles to every appointment.   Need to Contact us:  If you have any questions or concerns before your next appointment please send [place] a message through [place] or call our office at [phone].    TO LEAVE A MESSAGE FOR THE NURSE SELECT OPTION 2, PLEASE LEAVE A MESSAGE INCLUDING: YOUR NAME DATE OF BIRTH CALL BACK NUMBER REASON FOR CALL**this is important as we prioritize the call backs  YOU WILL RECEIVE A CALL BACK THE SAME DAY AS LONG AS YOU CALL BEFORE 4:00 PM

## 2023-06-01 ENCOUNTER — Telehealth (HOSPITAL_COMMUNITY): Payer: Self-pay

## 2023-06-01 NOTE — Telephone Encounter (Signed)
 Patient Advocate Encounter  Received notification that patient is approved to receive Eliquis from [employer]  Effective 05/24/2023 to 05/22/2024.  Burnell Blanks, CPhT Rx Patient Advocate Phone: [phone]

## 2023-06-15 ENCOUNTER — Telehealth: Payer: Self-pay | Admitting: Family

## 2023-06-15 NOTE — Progress Notes (Unsigned)
 Advanced Heart Failure Clinic Note    PCP: None Cardiologist: None   Chief Complaint: pedal edema  HPI:  Philip Monroe is a 48 y.o. AAM with multivessel CAD, chronic HFrEF, iCM, noncompaction CM, HTN, hx ETOH use, noncompliance and morbid obesity.    Has been followed by [facility] cardiology for iCM/noncompaction CM. RHC 5/24: Normal LV and RV filling pressures. Mild pulmonary hypertension with mild elevation of PVL. Low cardiac index 1.8. LHC 5/24: Severe multivessel CAD including 80% mid RCA, 100% RPDA, 100% mid LCx, 80% OM1, 80% mid-distal LAD. Medically managed.    Admitted 05/19/23 with progressive HF symptoms after being noncompliant with meds. Echo EF <20%, LV with GHK, LV severely dilated, RV mod reduced, LA severely dilated, mild-mod MR. Diuresed well with IV lasix. GDMT restarted. D/c weight 360 pounds   He presents today for a HF follow-up visit with a chief complaint of pedal edema at times. Has associated productive cough & intermittent leg numbness along bilateral lateral thighs when he stands for long periods of time. along with this. Denies shortness of breath, fatigue, chest pain, palpitations, abdominal distention, dizziness or difficulty sleeping.   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 Smoking < 1/2 ppd of cigarettes. Drank 2 mugs of beer when out to eat recently but, otherwise, hasn't been drinking anything.   Did not take any of his medications yet today but does have everything. Says that he's currently uninsured so doesn't have a PCP. Is trying to figure out what more is needed at social services as he has applied for medicaid.    ROS: All systems negative except what is listed in HPI, PMH and Problem List    Past Medical History:  Diagnosis Date   Aortic root dilatation (HCC)    a. 07/2022 Echo: Ao root 4.4cm, Asc Ao 4.0cm.   CAD (coronary artery disease)    a. 08/2017 Cath: LM nl, LAD 62m/d, D1 70ost, LCX 119m, OM1 90 inf  branch, 60 sup branch, OM2 small, RCA 30 diffuse, RPDA 100 w/ L->R collats; b. 07/2022 Echo: LM nl, LAD 74m, D1 80, LCX 124m, OM1 80, RCA 50m, RPDA 100-->No targets for PCI/CABG-->Med rx.   Chronic HFrEF (heart failure with reduced ejection fraction) (HCC)    a. 03/2015 Echo: EF 35-40%; b. 08/2017 Echo: EF 15%; c. 08/2017 cMRI: EF 35%; d. 06/2018 Echo: EF 10-15%; e. 07/2022 Echo: EF 15-20%, grIII DD, mild MR, sev dil LA, mod-sev PH, Ao root 4.4cm, Asc Ao 4.0cm; f. 07/2022 RHC: RA 4, RV 38/3, PA 43/18 (26), PCWP 11. CO/CI 5.3/1.8.   CKD (chronic kidney disease), stage II    Gout    Hyperlipidemia LDL goal <70    Hypertension    Ischemic cardiomyopathy    a. 03/2015 Echo: EF 35-40%; b. 08/2017 Echo: EF 15%; c. 08/2017 cMRI: EF 35%; d. 06/2018 Echo: EF 10-15%; e. 07/2022 Echo: EF 15-20%, grIII DD.   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 09/2022 Event monitor: predominantly sinus rhythm, avg HR 81 (46-167). 17 runs NSVT (fastest/longest 13 beats x 167). Rare PACs, 17.5% PVC burden.   PVC's (premature ventricular contractions)    a. 09/2022 Event Monitor: 17.5% PVC burden.   Statin intolerance    Tobacco abuse     Current Outpatient Medications  Medication Sig Dispense Refill  aspirin 81 MG chewable tablet Chew 1 tablet (81 mg total) by mouth daily. 90 tablet 2   cyanocobalamin (VITAMIN B12) 1000 MCG tablet Take 1 tablet (1,000 mcg total) by mouth daily. 90 tablet 1   ELIQUIS 5 MG TABS tablet Take 1 tablet (5 mg total) by mouth 2 (two) times daily. 180 tablet 3   empagliflozin (JARDIANCE) 10 MG TABS tablet Take 1 tablet (10 mg total) by mouth daily. 90 tablet 1   ezetimibe (ZETIA) 10 MG tablet Take 1 tablet (10 mg total) by mouth at bedtime. 90 tablet 2   losartan (COZAAR) 25 MG tablet Take 1 tablet (25 mg total) by mouth daily. 30 tablet 0   metoprolol succinate (TOPROL-XL) 25 MG  24 hr tablet Take 0.5 tablets (12.5 mg total) by mouth daily. 45 tablet 2   mexiletine (MEXITIL) 200 MG capsule Take 1 capsule (200 mg total) by mouth every 12 (twelve) hours. 60 capsule 2   Multiple Vitamin (MULTIVITAMIN WITH MINERALS) TABS tablet Take 1 tablet by mouth daily. 90 tablet 2   spironolactone (ALDACTONE) 25 MG tablet Take 1 tablet (25 mg total) by mouth daily. 90 tablet 1   torsemide (DEMADEX) 20 MG tablet Take 1 tablet (20 mg total) by mouth daily. 90 tablet 2   No current facility-administered medications for this visit.    Allergies  Allergen Reactions   Lactose Intolerance (Gi) Diarrhea   Statins     myalgias      Social History   Socioeconomic History   Marital status: Single    Spouse name: Not on file   Number of children: 4   Years of education: Not on file   Highest education level: 10th grade  Occupational History   Not on file  Tobacco Use   Smoking status: Some Days    Current packs/day: 0.00    Types: Cigarettes    Last attempt to quit: 03/20/2015    Years since quitting: 8.2   Smokeless tobacco: Never  Vaping Use   Vaping status: Never Used  Substance and Sexual Activity   Alcohol use: Yes    Alcohol/week: 36.0 standard drinks of alcohol    Types: 36 Cans of beer per week    Comment: 18 cans of beer on Friday and again on Saturday nights   Drug use: No   Sexual activity: Yes    Birth control/protection: Coitus interruptus, Condom  Other Topics Concern   Not on file  Social History Narrative   Not on file   Social Drivers of Health   Financial Resource Strain: Medium Risk (05/22/2023)   Overall Financial Resource Strain (CARDIA)    Difficulty of Paying Living Expenses: Somewhat hard  Food Insecurity: No Food Insecurity (05/20/2023)   Hunger Vital Sign    Worried About Running Out of Food in the Last Year: Never true    Ran Out of Food in the Last Year: Never true  Transportation Needs: No Transportation Needs (05/22/2023)   PRAPARE -  Administrator, Civil Service (Medical): No    Lack of Transportation (Non-Medical): No  Physical Activity: Not on file  Stress: Not on file  Social Connections: Not on file  Intimate Partner Violence: Not At Risk (05/20/2023)   Humiliation, Afraid, Rape, and Kick questionnaire    Fear of Current or Ex-Partner: No    Emotionally Abused: No    Physically Abused: No    Sexually Abused: No     No family history on file.  Vitals:   06/16/23 1000 06/16/23 1001  BP: 131/72   Pulse: 77 (!) 42  SpO2: 96% 95%  Weight: (!) 369 lb (167.4 kg)    Wt Readings from Last 3 Encounters:  06/16/23 (!) 369 lb (167.4 kg)  05/26/23 (!) 359 lb (162.8 kg)  05/24/23 (!) 359 lb 1.6 oz (162.9 kg)   Lab Results  Component Value Date   CREATININE 1.88 (H) 05/26/2023   CREATININE 1.47 (H) 05/24/2023   CREATININE 1.38 (H) 05/23/2023    PHYSICAL EXAM:  General: Well appearing. No resp difficulty HEENT: normal Neck: supple, no JVD Cor: Regular rhythm, bradycardic at time. No rubs, gallops or murmurs Lungs: clear Abdomen: soft, nontender, nondistended. Extremities: no cyanosis, clubbing, rash, trace pitting edema Neuro: alert & oriented X 3. Moves all 4 extremities w/o difficulty. Affect pleasant   ECG: not done   ASSESSMENT & PLAN:  1. Chronic systolic heart failure, likely combine iCM/NICM - Mentions of compaction CM from [facility] cards notes. - RHC 05/24: Normal LV and RV filling pressures. Mild pulmonary hypertension with mild elevation of PVL. Low cardiac index 1.8  - Echo 03/25 EF <20%, LV with GHK, LV severely dilated, RV mod reduced, LA severely dilated, mild-mod MR - NYHA class I - euvolemic - weight up 10 pounds from last visit here 3 weeks ago but currently asymptomatic; been out of town working  - Continue Jardiance 10 mg daily  - Continue Losartan 25 mg daily; could consider changing to entresto although concern about compliance with BID dosing - Continue Toprol XL 12.5 mg  daily; if he consistently stays bradycardic, may have to stop this - Continue Spironolactone 25 mg daily  - Continue Torsemide 20 mg daily - Had previous discussion regarding advanced therapies down the road with particular attention to VAD given his size. He said he would consider if needed but realizes there are barriers currently to get there - If EF not improving he will need ICD - BMET today - BNP 05/26/23 was 204.2   2. CAD - LHC 5/24: Severe multivessel CAD including 80% mid RCA, 100% RPDA, 100% mid LCx, 80% OM1, 80% mid-distal LAD.  - Medical management recommended at [facility] cards 05/24 - No current s/s angina - Continue Zetia, ASA - Continue Metoprolol XL 12.5 QHS - Continue Eliquis 5 bid   3. PVCs - Zio 2024 with 17% burden - Much improved with switch from amio to mexilitene - Will need sleep study when he has insurance - discussed repeating zio to see if PVC burden has declined but he defers because he had irritation with tape from previous zio  4. HTN - BP 131/72; did not take meds before coming - information about [facility] provided today along with application - social work consult placed to assist with medicaid application - BMET 05/26/23 reviewed and showed sodium 134, potassium 4.3, creatinine 1.88 & GFR 44 - BMET today  5. 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 Morbid obesity - Body mass index is 46.12 kg/m. - ideally would use GLP1RA but does not have insurance   7. Tobacco / ETOH use - has cut smoking down 1ppd -> 1/2 ppd - Had 2 mugs of beer when eating out recently, nothing more frequent - Discussed need for cessation to permit advanced therapy consideration  8. OSA - needs sleep study but doesn't have  insurance - applying for Medicaid  - social work consult placed today - [facility] information provided  9: Leg numbness- - says  that this is occasional and when it happens, it is only on the outer thighs - sounds sciatic in nature - no leg pain or numbness further down the leg - encouraged use of ice on the lower back, stretching and not sitting for long periods of time (this will be difficult due to frequent car rides for his job to [employer]) - [employer] information provided so he can get established; if needs sooner appt, encouraged to return to [place]   Return in 3 weeks, sooner if needed.   Delma Freeze, FNP 06/15/23

## 2023-06-15 NOTE — Telephone Encounter (Incomplete)
 Called to confirm/remind patient of their appointment at the [facility] on 06/16/23***.   Appointment:   [x] Confirmed  [] Left mess   [] No answer/No voice mail  [] Phone not in service  Patient reminded to bring all medications and/or complete list.  Confirmed patient has transportation. Gave directions, instructed to utilize valet parking.

## 2023-06-16 ENCOUNTER — Ambulatory Visit (HOSPITAL_BASED_OUTPATIENT_CLINIC_OR_DEPARTMENT_OTHER): Payer: MEDICAID | Admitting: Family

## 2023-06-16 ENCOUNTER — Other Ambulatory Visit
Admission: RE | Admit: 2023-06-16 | Discharge: 2023-06-16 | Disposition: A | Discharge status: Home / Self Care | Payer: MEDICAID | Source: Ambulatory Visit | Attending: Family | Admitting: Family

## 2023-06-16 ENCOUNTER — Encounter: Payer: Self-pay | Admitting: Family

## 2023-06-16 ENCOUNTER — Other Ambulatory Visit: Payer: Self-pay

## 2023-06-16 VITALS — BP 131/72 | HR 42 | Wt 369.0 lb

## 2023-06-16 DIAGNOSIS — I2510 Atherosclerotic heart disease of native coronary artery without angina pectoris: Secondary | ICD-10-CM

## 2023-06-16 DIAGNOSIS — E782 Mixed hyperlipidemia: Secondary | ICD-10-CM

## 2023-06-16 DIAGNOSIS — Z720 Tobacco use: Secondary | ICD-10-CM

## 2023-06-16 DIAGNOSIS — R200 Anesthesia of skin: Secondary | ICD-10-CM

## 2023-06-16 DIAGNOSIS — I493 Ventricular premature depolarization: Secondary | ICD-10-CM

## 2023-06-16 DIAGNOSIS — I5022 Chronic systolic (congestive) heart failure: Secondary | ICD-10-CM | POA: Insufficient documentation

## 2023-06-16 DIAGNOSIS — I10 Essential (primary) hypertension: Secondary | ICD-10-CM

## 2023-06-16 DIAGNOSIS — G4733 Obstructive sleep apnea (adult) (pediatric): Secondary | ICD-10-CM

## 2023-06-16 LAB — BASIC METABOLIC PANEL WITH GFR
Anion gap: 8 (ref 5–15)
BUN: 22 mg/dL — ABNORMAL HIGH (ref 6–20)
CO2: 24 mmol/L (ref 22–32)
Calcium: 9.1 mg/dL (ref 8.9–10.3)
Chloride: 104 mmol/L (ref 98–111)
Creatinine, Ser: 1.14 mg/dL (ref 0.61–1.24)
GFR, Estimated: >60 mL/min (ref >60)
Glucose, Bld: 110 mg/dL — ABNORMAL HIGH (ref 70–99)
Potassium: 3.6 mmol/L (ref 3.5–5.1)
Sodium: 136 mmol/L (ref 135–145)

## 2023-06-16 NOTE — Patient Instructions (Signed)
 Lab Work:  Go over to the MEDICAL MALL. Go pass the gift shop and have your blood work completed.  We will only call you if the results are abnormal or if the provider would like to make medication changes.   Referrals:  We have placed a referral today to Social Work for assistance with any social determinants you may be need assistance with. They should reach out to you within 1-2 weeks. If they do not, please reach out to them. Their information is below on this AVS.   Special Instructions // Education:  Continue weighing daily and call for an overnight weight gain of 3 pounds or more or a weekly weight gain of more than 5 pounds.   Follow-Up in: 3 weeks with Dr. Gala Romney.  At the [facility], you and your health needs are our priority. We have a designated team specialized in the treatment of Heart Failure. This Care Team includes your primary Heart Failure Specialized Cardiologist (physician), Advanced Practice Providers (APPs- Physician Assistants and Nurse Practitioners), and Pharmacist who all work together to provide you with the care you need, when you need it.   You may see any of the following providers on your designated Care Team at your next follow up:  Dr. Arvilla Meres Dr. Marca Ancona Dr. Dorthula Nettles Dr. Theresia Bough [name], FNP Enos Fling, RPH-CPP  Please be sure to bring in all your medications bottles to every appointment.   Need to Contact us:  If you have any questions or concerns before your next appointment please send [place] a message through [place] or call our office at [phone].    TO LEAVE A MESSAGE FOR THE NURSE SELECT OPTION 2, PLEASE LEAVE A MESSAGE INCLUDING: YOUR NAME DATE OF BIRTH CALL BACK NUMBER REASON FOR CALL**this is important as we prioritize the call backs  YOU WILL RECEIVE A CALL BACK THE SAME DAY AS LONG AS YOU CALL BEFORE 4:00 PM

## 2023-06-16 NOTE — Progress Notes (Signed)
 [facility] - HEART FAILURE CLINIC - PHARMACIST COUNSELING NOTE  Adherence Assessment  Do you ever forget to take your medication? [] Yes [x] No  Do you ever skip doses due to side effects? [] Yes [x] No  Do you have trouble affording your medicines?  [] Yes [x] No  Are you ever unable to pick up your medication due to transportation difficulties? [] Yes [x] No  Do you ever stop taking your medications because you don't believe they are helping? [] Yes [x] No  Do you check your weight daily? [] Yes [x] No  Do you check your blood pressure daily? [] Yes [x] No  Adherence strategy: Pill box; declined   Barriers to obtaining medications: None reported    Vital signs: HR 77, BP 131/72, weight 369 lbs.  ECHO: Date 05/20/23, EF < 20% Renal function: Date 05/26/23, GFR 44  Current Guideline-Directed Medical Therapy/Evidence Based Medicine  ACE/ARB/ARNI: Losartan 25 mg daily Target dose: 100 mg daily   Beta Blocker: Metoprolol succinate 12.5 mg daily Target dose: 200 mg daily  Aldosterone Antagonist: Spironolactone 25 mg daily Target dose: 50 mg daily  SGLT2i: Empagliflozin 10 mg daily Target dose: On target dose  Diuretic: Torsemide 20 mg daily  ASSESSMENT 48 year old male who presents to the HF clinic for a follow-up appointment. PMH is significant for multivessel CAD, chronic HFrEF, iCM, noncompaction CM, HTN, hx ETOH use, noncompliance and morbid obesity. He is requesting refills on his Valla Leaver, and Eliquis today as well as an update on patient assistance for these medications.   Recent ED visit or hospitalization (past 6 months):  Date: 05/19/23 - 05/24/23, CC: SOB , Admission Dx: acute on chronic CHF & NSTEMI   PLAN Continue current regimen as directed by NP  Patient denied Entresto patient assistance due to income  Informed patient that Jardiance & Eliquis patient assistance is still in process and requires him to submit his tax forms   Time spent: 20  minutes  Littie Deeds, PharmD Pharmacy Resident  06/16/2023 4:01 PM

## 2023-06-21 ENCOUNTER — Other Ambulatory Visit: Payer: Self-pay

## 2023-06-21 ENCOUNTER — Emergency Department: Payer: Self-pay

## 2023-06-21 ENCOUNTER — Emergency Department
Admission: EM | Admit: 2023-06-21 | Discharge: 2023-06-21 | Disposition: A | Discharge status: Home / Self Care | Payer: Self-pay | Attending: Emergency Medicine | Admitting: Emergency Medicine

## 2023-06-21 ENCOUNTER — Encounter: Payer: Self-pay | Admitting: *Deleted

## 2023-06-21 DIAGNOSIS — W298XXA Contact with other powered powered hand tools and household machinery, initial encounter: Secondary | ICD-10-CM | POA: Insufficient documentation

## 2023-06-21 DIAGNOSIS — Z23 Encounter for immunization: Secondary | ICD-10-CM | POA: Insufficient documentation

## 2023-06-21 DIAGNOSIS — S61412A Laceration without foreign body of left hand, initial encounter: Secondary | ICD-10-CM

## 2023-06-21 MED ORDER — CEPHALEXIN 500 MG PO CAPS
500.0000 mg | ORAL_CAPSULE | Freq: Once | ORAL | Status: AC
  Administered 2023-06-21: 500 mg via ORAL
  Filled 2023-06-21: qty 1

## 2023-06-21 MED ORDER — HYDROCODONE-ACETAMINOPHEN 5-325 MG PO TABS
1.0000 | ORAL_TABLET | Freq: Once | ORAL | Status: AC
  Administered 2023-06-21: 1 via ORAL
  Filled 2023-06-21: qty 1

## 2023-06-21 MED ORDER — LIDOCAINE HCL (PF) 1 % IJ SOLN
5.0000 mL | Freq: Once | INTRAMUSCULAR | Status: AC
  Administered 2023-06-21: 5 mL
  Filled 2023-06-21: qty 5

## 2023-06-21 MED ORDER — HYDROCODONE-ACETAMINOPHEN 5-325 MG PO TABS
1.0000 | ORAL_TABLET | ORAL | 0 refills | Status: DC | PRN
  Filled 2023-06-21: qty 20, 4d supply, fill #0

## 2023-06-21 MED ORDER — CEPHALEXIN 500 MG PO CAPS
500.0000 mg | ORAL_CAPSULE | Freq: Four times a day (QID) | ORAL | 0 refills | Status: AC
  Filled 2023-06-21: qty 40, 10d supply, fill #0

## 2023-06-21 MED ORDER — TETANUS-DIPHTH-ACELL PERTUSSIS 5-2.5-18.5 LF-MCG/0.5 IM SUSY
0.5000 mL | PREFILLED_SYRINGE | Freq: Once | INTRAMUSCULAR | Status: AC
  Administered 2023-06-21: 0.5 mL via INTRAMUSCULAR
  Filled 2023-06-21: qty 0.5

## 2023-06-21 NOTE — Discharge Instructions (Addendum)
 Your evaluated in the ED for a laceration to your left hand.  Your x-ray is normal.  Please call and schedule appointment with the hand surgeon tomorrow for further evaluation.  Keep sutures dry for first 24 hrs. Keep suture site of clean & dry. Gently use soap & water after first 24 hrs. DO NOT USE alcohol, hydrogen peroxide etc, to clean skin. You may cover the incision with clean gauze & replace it after your daily shower for your comfort.   If you have been prescribed antibiotics, take them exactly as directed. Do not stop taking them because your symptoms have improved.   Keep your hand elevated above heart level is much as possible.  You will need your sutures removed in 7 to 10 days.  You can return to the ED or follow-up with the hand surgeon.  Please limit your use of this hand until orthopedic evaluation.  Monitor for signs of infection such as development of a fever, redness and pus from the incision site.

## 2023-06-21 NOTE — ED Triage Notes (Signed)
 Pt ambulatory to triage.  Pt has laceration to left hand.  Pt was using a router and states it cut across the top of his hand.  Bleeding controlled.  Pt alert  speech clear.

## 2023-06-21 NOTE — ED Provider Triage Note (Signed)
 Emergency Medicine Provider Triage Evaluation Note  Philip Monroe , a 48 y.o. male  was evaluated in triage.  Pt complains of left hand laceration.  Patient is taking blood thinners, history of cardiac heart failure.  Cannot remember last Tdap. .  Review of Systems  Positive:  Negative:   Physical Exam  BP 115/87   Pulse 77   Temp 98.3 F (36.8 C) (Oral)   Resp 18   Ht 6\' 3"  (1.905 m)   Wt (!) 163.3 kg   SpO2 98%   BMI 45.00 kg/m  Gen:   Awake, no distress   Resp:  Normal effort  MSK:   Moves extremities without difficulty  Other:  Left hand: Presence of 9 lacerations of about 1 cm in 2nd, 3rd and 4th left fingers and proximal phalanges.  Patient has limitation for flexion of the second finger  Medical Decision Making  Medically screening exam initiated at 5:47 PM.  Appropriate orders placed.  Levander Campion was informed that the remainder of the evaluation will be completed by another provider, this initial triage assessment does not replace that evaluation, and the importance of remaining in the ED until their evaluation is complete.  Patient with multiple lacerations will require laceration repair and Tdap   Gladys Damme, PA-C 06/21/23 1748

## 2023-06-21 NOTE — ED Provider Notes (Signed)
 [facility] Emergency Department Provider Note     Event Date/Time   First MD Initiated Contact with Patient 06/21/23 1807     (approximate)   History   Laceration   HPI  Philip Monroe is a 48 y.o. male with no significant past medical history presents to the ED for a left hand injury.  Patient reports he was using a compact router and the saw piece popped off and sliced the top of the left hand across his knuckles.  Immediate pain and bleeding noted.  Patient used direct pressure to slow down bleeding.  Patient is reporting severe pain and limited flexion to the left index finger.  Patient endorses anticoagulation use.  Unknown tetanus status.  No other complaints.      Physical Exam   Triage Vital Signs: ED Triage Vitals  Encounter Vitals Group     BP 06/21/23 1740 115/87     Systolic BP Percentile --      Diastolic BP Percentile --      Pulse Rate 06/21/23 1740 77     Resp 06/21/23 1740 18     Temp 06/21/23 1740 98.3 F (36.8 C)     Temp Source 06/21/23 1740 Oral     SpO2 06/21/23 1740 98 %     Weight 06/21/23 1740 (!) 360 lb (163.3 kg)     Height 06/21/23 1740 6\' 3"  (1.905 m)     Head Circumference --      Peak Flow --      Pain Score 06/21/23 1750 8     Pain Loc --      Pain Education --      Exclude from Growth Chart --     Most recent vital signs: Vitals:   06/21/23 1740 06/21/23 2203  BP: 115/87 (!) 120/92  Pulse: 77 68  Resp: 18 16  Temp: 98.3 F (36.8 C) 97.7 F (36.5 C)  SpO2: 98% 95%    General Awake and alert no distress.  HEENT NCAT.  CV:  Good peripheral perfusion.  RESP:  Normal effort.  ABD:  No distention.  Other:  Please review media below.  Limited range of motion with flexion to the left index finger.  Multiple lacerations across dorsal aspect of hand.  Neurovascular status intact all throughout.  Good capillary refill.        ED Results / Procedures / Treatments   Labs (all labs ordered are  listed, but only abnormal results are displayed) Labs Reviewed - No data to display  RADIOLOGY  I personally viewed and evaluated these images as part of my medical decision making, as well as reviewing the written report by the radiologist.  ED Provider Interpretation: no acute fracture noted.  DG Hand Complete Left Result Date: 06/21/2023 CLINICAL DATA:  Left hand laceration. EXAM: LEFT HAND - COMPLETE 3+ VIEW COMPARISON:  None Available. FINDINGS: Skin and soft tissue irregularity about the radial aspect of the index finger metacarpal phalangeal joint. Tiny radiopaque densities with overlying dressing in place. No evidence of acute fracture. There is degenerative change of the distal radioulnar and radiocarpal joints. 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 IMPRESSION: 1. Skin and soft tissue irregularity about the radial aspect of the index finger metacarpophalangeal joint. Tiny radiopaque densities with overlying dressing in place. 2. No acute fracture. Electronically Signed   By: Narda Rutherford M.D.   On: 06/21/2023 20:22    PROCEDURES:  Critical Care performed: No  .Laceration Repair  Date/Time: 06/22/2023 4:09 PM  Performed by: Kern Reap A, PA-C Authorized by: Conrad Salem, PA-C   Consent:    Consent obtained:  Verbal   Consent given by:  Patient   Risks discussed:  Infection, pain, poor cosmetic result, tendon damage, vascular damage, need for additional repair, nerve damage and poor wound healing Anesthesia:    Anesthesia method:  Local infiltration and nerve block   Local anesthetic:  Lidocaine 1% w/o epi   Block location:  Digital block   Block needle gauge:  27 G   Block anesthetic:  Lidocaine 1% w/o epi   Block injection procedure:  Incremental injection   Block outcome:  Anesthesia achieved Laceration details:    Location:  Finger (and hand)   Finger location:  L index finger   Length (cm):  1   Depth (mm):  1 Exploration:     Hemostasis achieved with:  Direct pressure Treatment:    Area cleansed with:  Povidone-iodine and saline   Amount of cleaning:  Extensive   Irrigation solution:  Sterile saline   Irrigation method:  Pressure wash Skin repair:    Repair method:  Sutures   Suture size:  5-0 (and 6-0 prolene)   Suture material:  Nylon   Suture technique:  Simple interrupted   Number of sutures:  20 Approximation:    Approximation:  Close Repair type:    Repair type:  Intermediate Post-procedure details:    Dressing:  Non-adherent dressing and bulky dressing   Procedure completion:  Tolerated   MEDICATIONS ORDERED IN ED: Medications  HYDROcodone-acetaminophen (NORCO/VICODIN) 5-325 MG per tablet 1 tablet (1 tablet Oral Given 06/21/23 1849)  lidocaine (PF) (XYLOCAINE) 1 % injection 5 mL (5 mLs Infiltration Given by Other 06/21/23 1958)  lidocaine (PF) (XYLOCAINE) 1 % injection 5 mL (5 mLs Infiltration Given by Other 06/21/23 1958)  Tdap (BOOSTRIX) injection 0.5 mL (0.5 mLs Intramuscular Given 06/21/23 2206)  cephALEXin (KEFLEX) capsule 500 mg (500 mg Oral Given 06/21/23 2206)  HYDROcodone-acetaminophen (NORCO/VICODIN) 5-325 MG per tablet 1 tablet (1 tablet Oral Given 06/21/23 2206)    IMPRESSION / MDM / ASSESSMENT AND PLAN / ED COURSE  I reviewed the triage vital signs and the nursing notes.                               48 y.o. male presents to the emergency department for evaluation and treatment of acute left hand injury . See HPI for further details.   Differential diagnosis includes, but is not limited to laceration, fracture, tendon injury  Patient's presentation is most consistent with acute complicated illness / injury requiring diagnostic workup.  Patient is alert and oriented.  He is hemodynamically stable.  High suspicion for tendon injury given the absence of active flexion of the left index finger, however given injury being on the dorsal aspect and extensor tendon this is less likely unless the  router compacter severed deeper into the palm aspect on the left index finger.  Laceration repair completed.  See procedure note. Ortho consulted.  Case discussed with Dallas Schimke, MD who recommended antibiotics and close hand surgery follow-up.  First dose of Keflex provided in ED.  Tetanus updated in ED.  Patient is agreement with treatment plan.  ED return precautions thoroughly discussed.  Patient is in stable condition for discharge home.  Laceration care provided.   FINAL CLINICAL IMPRESSION(S) / ED DIAGNOSES   Final diagnoses:  Laceration  of left hand without foreign body, initial encounter     Rx / DC Orders   ED Discharge Orders          Ordered    cephALEXin (KEFLEX) 500 MG capsule  4 times daily        06/21/23 2145    HYDROcodone-acetaminophen (NORCO/VICODIN) 5-325 MG tablet  Every 4 hours PRN        06/21/23 2201             Note:  This document was prepared using Dragon voice recognition software and may include unintentional dictation errors.    Romeo Apple, Jevaun Strick A, PA-C 06/22/23 1612    Claybon Jabs, MD 06/22/23 1630

## 2023-06-22 ENCOUNTER — Other Ambulatory Visit: Payer: Self-pay | Admitting: Family

## 2023-06-22 ENCOUNTER — Other Ambulatory Visit: Payer: Self-pay

## 2023-06-22 MED FILL — Losartan Potassium Tab 25 MG: ORAL | 30 days supply | Qty: 30 | Fill #0 | Status: CN

## 2023-06-27 ENCOUNTER — Other Ambulatory Visit: Payer: Self-pay

## 2023-07-03 ENCOUNTER — Telehealth: Payer: Self-pay | Admitting: Internal Medicine

## 2023-07-03 NOTE — Telephone Encounter (Signed)
 Called to confirm/remind patient of their appointment at the [facility] on 07/04/23.   Appointment:   [x] Confirmed  [] Left mess   [] No answer/No voice mail  [] VM Full/unable to leave message  [] Phone not in service  Patient reminded to bring all medications and/or complete list.  Confirmed patient has transportation. Gave directions, instructed to utilize valet parking.

## 2023-07-04 ENCOUNTER — Other Ambulatory Visit: Payer: Self-pay | Admitting: Internal Medicine

## 2023-07-04 ENCOUNTER — Inpatient Hospital Stay (HOSPITAL_COMMUNITY)
Admission: RE | Admit: 2023-07-04 | Discharge: 2023-07-04 | Disposition: A | Discharge status: Home / Self Care | Source: Ambulatory Visit | Attending: Internal Medicine | Admitting: Internal Medicine

## 2023-07-04 ENCOUNTER — Other Ambulatory Visit: Payer: Self-pay

## 2023-07-04 ENCOUNTER — Ambulatory Visit: Payer: Self-pay | Attending: Internal Medicine | Admitting: Internal Medicine

## 2023-07-04 VITALS — BP 126/88 | HR 81 | Wt 366.0 lb

## 2023-07-04 DIAGNOSIS — I110 Hypertensive heart disease with heart failure: Secondary | ICD-10-CM | POA: Insufficient documentation

## 2023-07-04 DIAGNOSIS — Z6841 Body Mass Index (BMI) 40.0 and over, adult: Secondary | ICD-10-CM | POA: Diagnosis not present

## 2023-07-04 DIAGNOSIS — I5022 Chronic systolic (congestive) heart failure: Secondary | ICD-10-CM | POA: Diagnosis present

## 2023-07-04 DIAGNOSIS — I2510 Atherosclerotic heart disease of native coronary artery without angina pectoris: Secondary | ICD-10-CM | POA: Diagnosis not present

## 2023-07-04 DIAGNOSIS — Z720 Tobacco use: Secondary | ICD-10-CM

## 2023-07-04 DIAGNOSIS — Z7901 Long term (current) use of anticoagulants: Secondary | ICD-10-CM | POA: Diagnosis not present

## 2023-07-04 DIAGNOSIS — I493 Ventricular premature depolarization: Secondary | ICD-10-CM

## 2023-07-04 DIAGNOSIS — Z5986 Financial insecurity: Secondary | ICD-10-CM | POA: Diagnosis not present

## 2023-07-04 DIAGNOSIS — M549 Dorsalgia, unspecified: Secondary | ICD-10-CM | POA: Insufficient documentation

## 2023-07-04 DIAGNOSIS — Z79899 Other long term (current) drug therapy: Secondary | ICD-10-CM | POA: Insufficient documentation

## 2023-07-04 DIAGNOSIS — E785 Hyperlipidemia, unspecified: Secondary | ICD-10-CM | POA: Diagnosis not present

## 2023-07-04 DIAGNOSIS — F17210 Nicotine dependence, cigarettes, uncomplicated: Secondary | ICD-10-CM | POA: Insufficient documentation

## 2023-07-04 DIAGNOSIS — R200 Anesthesia of skin: Secondary | ICD-10-CM | POA: Diagnosis not present

## 2023-07-04 DIAGNOSIS — I255 Ischemic cardiomyopathy: Secondary | ICD-10-CM | POA: Insufficient documentation

## 2023-07-04 DIAGNOSIS — E782 Mixed hyperlipidemia: Secondary | ICD-10-CM

## 2023-07-04 DIAGNOSIS — G4733 Obstructive sleep apnea (adult) (pediatric): Secondary | ICD-10-CM

## 2023-07-04 DIAGNOSIS — I272 Pulmonary hypertension, unspecified: Secondary | ICD-10-CM | POA: Insufficient documentation

## 2023-07-04 DIAGNOSIS — I10 Essential (primary) hypertension: Secondary | ICD-10-CM

## 2023-07-04 MED ORDER — METOPROLOL SUCCINATE ER 25 MG PO TB24
25.0000 mg | ORAL_TABLET | Freq: Every day | ORAL | 3 refills | Status: DC
  Filled 2023-07-04 – 2023-08-25 (×2): qty 90, 90d supply, fill #0

## 2023-07-04 NOTE — Patient Instructions (Signed)
 No Labs done today.   INCREASE Metoprolol  to 25mg  (1 tablet) by mouth daily.   No other medication changes were made. Please continue all current medications as prescribed.  Your provider has recommended that  you wear a Zio Patch for 2 days.  This monitor will record your heart rhythm for our review.  IF you have any symptoms while wearing the monitor please press the button.  If you have any issues with the patch or you notice a red or orange light on it please call the company at [phone].  Once you remove the patch please mail it back to the company as soon as possible so we can get the results.  Your physician recommends that you schedule a follow-up appointment in: 2 months with Dr. Bensimhon  If you have any questions or concerns before your next appointment please send us  a message through [facility] or call our office at [phone].    TO LEAVE A MESSAGE FOR THE NURSE SELECT OPTION 2, PLEASE LEAVE A MESSAGE INCLUDING: YOUR NAME DATE OF BIRTH CALL BACK NUMBER REASON FOR CALL**this is important as we prioritize the call backs  YOU WILL RECEIVE A CALL BACK THE SAME DAY AS LONG AS YOU CALL BEFORE 4:00 PM   Do the following things EVERYDAY: Weigh yourself in the morning before breakfast. Write it down and keep it in a log. Take your medicines as prescribed Eat low salt foods--Limit salt (sodium) to 2000 mg per day.  Stay as active as you can everyday Limit all fluids for the day to less than 2 liters   At the [facility], you and your health needs are our priority. As part of our continuing mission to provide you with exceptional heart care, we have created designated Provider Care Teams. These Care Teams include your primary Cardiologist (physician) and Advanced Practice Providers (APPs- Physician Assistants and Nurse Practitioners) who all work together to provide you with the care you need, when you need it.   You may see any of the following providers on  your designated Care Team at your next follow up: Dr Jules Oar Dr Peder Bourdon Dr. Mimi Alt, NP Ruddy Corral, [place] [facility] [place], [place] Dennise Fitz, NP Luster Salters, PharmD   Please be sure to bring in all your medications bottles to every appointment.    Thank you for choosing  [facility]

## 2023-07-04 NOTE — Progress Notes (Signed)
 [facility] Note    PCP: None Cardiologist: None   Chief Complaint: pedal edema  HPI:  Philip Monroe is a 48 y.o. AAM with multivessel CAD, chronic HFrEF, iCM, noncompaction CM, HTN, hx ETOH use, noncompliance and morbid obesity.    Has been followed by [facility] cardiology for iCM/noncompaction CM. RHC 5/24: Normal LV and RV filling pressures. Mild pulmonary hypertension with mild elevation of PVL. Low cardiac index 1.8. LHC 5/24: Severe multivessel CAD including 80% mid RCA, 100% RPDA, 100% mid LCx, 80% OM1, 80% mid-distal LAD. Medically managed.    Admitted 05/19/23 with progressive HF symptoms after being noncompliant with meds. Echo EF <20%, LV with GHK, LV severely dilated, RV mod reduced, LA severely dilated, mild-mod MR. Diuresed well with IV lasix . GDMT restarted. D/c weight 360 pounds   He presents today for a HF follow-up visit. Feels ok. Scale is in his car so hasn't used it. Breathing ok. No CP. No edema, orthopnea or PND. Only complaint is back pain and leg numbness. Compliant with meds.   ROS: All systems negative except what is listed in HPI, PMH and Problem List    Past Medical History:  Diagnosis Date   Aortic root dilatation (HCC)    a. 07/2022 Echo: Ao root 4.4cm, Asc Ao 4.0cm.   CAD (coronary artery disease)    a. 08/2017 Cath: LM nl, LAD 36m/d, D1 70ost, LCX 120m, OM1 90 inf branch, 60 sup branch, OM2 small, RCA 30 diffuse, RPDA 100 w/ L->R collats; b. 07/2022 Echo: LM nl, LAD 14m, D1 80, LCX 133m, OM1 80, RCA 40m, RPDA 100-->No targets for PCI/CABG-->Med rx.   Chronic HFrEF (heart failure with reduced ejection fraction) (HCC)    a. 03/2015 Echo: EF 35-40%; b. 08/2017 Echo: EF 15%; c. 08/2017 cMRI: EF 35%; d. 06/2018 Echo: EF 10-15%; e. 07/2022 Echo: EF 15-20%, grIII DD, mild MR, sev dil LA, mod-sev PH, Ao root 4.4cm, Asc Ao 4.0cm; f. 07/2022 RHC: RA 4, RV 38/3, PA 43/18 (26), PCWP 11. CO/CI 5.3/1.8.   CKD (chronic kidney disease), stage II    Gout     Hyperlipidemia LDL goal <70    Hypertension    Ischemic cardiomyopathy    a. 03/2015 Echo: EF 35-40%; b. 08/2017 Echo: EF 15%; c. 08/2017 cMRI: EF 35%; d. 06/2018 Echo: EF 10-15%; e. 07/2022 Echo: EF 15-20%, grIII DD.   IVCD (intraventricular conduction defect)    Left ventricular noncompaction [facility])    Morbid obesity (HCC)    NSVT (nonsustained ventricular tachycardia) (HCC)    a. 09/2022 Event monitor: predominantly sinus rhythm, avg HR 81 (46-167). 17 runs NSVT (fastest/longest 13 beats x 167). Rare PACs, 17.5% PVC burden.   PVC's (premature ventricular contractions)    a. 09/2022 Event Monitor: 17.5% PVC burden.   Statin intolerance    Tobacco abuse     Current Outpatient Medications  Medication Sig Dispense Refill   aspirin  81 MG chewable tablet Chew 1 tablet (81 mg total) by mouth daily. 90 tablet 2   cyanocobalamin  (VITAMIN B12) 1000 MCG tablet Take 1 tablet (1,000 mcg total) by mouth daily. 90 tablet 1   ELIQUIS  5 MG TABS tablet Take 1 tablet (5 mg total) by mouth 2 (two) times daily. 180 tablet 3   empagliflozin  (JARDIANCE ) 10 MG TABS tablet Take 1 tablet (10 mg total) by mouth daily. 90 tablet 1   ezetimibe  (ZETIA ) 10 MG tablet Take 1 tablet (10 mg total) by mouth at bedtime. 90 tablet  2   HYDROcodone -acetaminophen  (NORCO/VICODIN) 5-325 MG tablet Take 1 tablet by mouth every 4 (four) hours as needed. 20 tablet 0   losartan  (COZAAR ) 25 MG tablet Take 1 tablet (25 mg total) by mouth daily. 30 tablet 5   metoprolol  succinate (TOPROL -XL) 25 MG 24 hr tablet Take 0.5 tablets (12.5 mg total) by mouth daily. 45 tablet 2   mexiletine (MEXITIL ) 200 MG capsule Take 1 capsule (200 mg total) by mouth every 12 (twelve) hours. 60 capsule 2   Multiple Vitamin (MULTIVITAMIN WITH MINERALS) TABS tablet Take 1 tablet by mouth daily. 90 tablet 2   spironolactone  (ALDACTONE ) 25 MG tablet Take 1 tablet (25 mg total) by mouth daily. 90 tablet 1   torsemide  (DEMADEX ) 20 MG tablet Take 1 tablet (20 mg total) by  mouth daily. 90 tablet 2   No current facility-administered medications for this visit.    Allergies  Allergen Reactions   Lactose Intolerance (Gi) Diarrhea   Statins     myalgias      Social History   Socioeconomic History   Marital status: Single    Spouse name: Not on file   Number of children: 4   Years of education: Not on file   Highest education level: 10th grade  Occupational History   Not on file  Tobacco Use   Smoking status: Some Days    Current packs/day: 0.00    Types: Cigarettes    Last attempt to quit: 03/20/2015    Years since quitting: 8.2   Smokeless tobacco: Never  Vaping Use   Vaping status: Never Used  Substance and Sexual Activity   Alcohol use: Yes    Alcohol/week: 36.0 standard drinks of alcohol    Types: 36 Cans of beer per week    Comment: 18 cans of beer on Friday and again on Saturday nights   Drug use: No   Sexual activity: Yes    Birth control/protection: Coitus interruptus, Condom  Other Topics Concern   Not on file  Social History Narrative   Not on file   Social Drivers of Health   Financial Resource Strain: Medium Risk (05/22/2023)   Overall Financial Resource Strain (CARDIA)    Difficulty of Paying Living Expenses: Somewhat hard  Food Insecurity: No Food Insecurity (05/20/2023)   Hunger Vital Sign    Worried About Running Out of Food in the Last Year: Never true    Ran Out of Food in the Last Year: Never true  Transportation Needs: No Transportation Needs (05/22/2023)   PRAPARE - Administrator, Civil Service (Medical): No    Lack of Transportation (Non-Medical): No  Physical Activity: Not on file  Stress: Not on file  Social Connections: Not on file  Intimate Partner Violence: Not At Risk (05/20/2023)   Humiliation, Afraid, Rape, and Kick questionnaire    Fear of Current or Ex-Partner: No    Emotionally Abused: No    Physically Abused: No    Sexually Abused: No     No family history on file.  Vitals:    07/04/23 1523 07/04/23 1524  BP: 126/88   Pulse: 84 81  SpO2: 97% 100%  Weight: (!) 366 lb (166 kg)     Wt Readings from Last 3 Encounters:  07/04/23 (!) 366 lb (166 kg)  06/21/23 (!) 360 lb (163.3 kg)  06/16/23 (!) 369 lb (167.4 kg)   Lab Results  Component Value Date   CREATININE 1.14 06/16/2023   CREATININE 1.88 (H) 05/26/2023  CREATININE 1.47 (H) 05/24/2023    PHYSICAL EXAM: General:  Sitting in chair No resp difficulty HEENT: normal Neck: supple. no JVD. Carotids 2+ bilat; no bruits. No lymphadenopathy or thryomegaly appreciated. Cor: Regular rate & rhythm. No rubs, gallops or murmurs. Lungs: clear Abdomen: obese soft, nontender, nondistended.No bruits or masses. Good bowel sounds. Extremities: no cyanosis, clubbing, rash, edema Neuro: alert & orientedx3, cranial nerves grossly intact. moves all 4 extremities w/o difficulty. Affect pleasant    ASSESSMENT & PLAN:  1. Chronic systolic heart failure, likely combine iCM/NICM - Mentions of compaction CM from [facility] cards notes. - RHC 05/24: Normal LV and RV filling pressures. Mild pulmonary hypertension with mild elevation of PVL. Low cardiac index 1.8  - Echo 03/25 EF <20%, LV with GHK, LV severely dilated, RV mod reduced, LA severely dilated, mild-mod MR - Doing well. NYHA I-II - weight stable - Start Jardiance  10 mg daily (just got it in the mail) - Continue Losartan  25 mg daily; could consider changing to entresto  although concern about compliance with BID dosing - Increase Toprol  12.5 -> 25 mg daily - Continue Spironolactone  25 mg daily  - Continue Torsemide  20 mg daily. Hasn't need to take extra - Had previous discussion regarding advanced therapies down the road with particular attention to VAD given his size. He said he would consider if needed but realizes there are barriers currently to get there - If EF not improving he will need ICD. We discussed this - Continue Eliquis  in setting of potential LVNC   2.  CAD - LHC 5/24 at [facility]: Severe multivessel CAD including 80% mid RCA, 100% RPDA, 100% mid LCx, 80% OM1, 80% mid-distal LAD.  - Medical management recommended at [facility] cards 05/24. Will need to review films to see if potentially could be CABG candidate - No current s/s angina - Continue Zetia , ASA - Continue Metoprolol  XL 12.5 QHS - Continue Eliquis  5 bid   3. PVCs - Zio 2024 with 17% burden - Much improved with switch from amio to mexilitene - Will need sleep study when he has insurance - Will repeat Zio to assess burden (will only do 48-hr monitor as had severe irritation with last patch)  4. HTN - BP 131/72; did not take meds before coming - Has referral to [facility]   Titrate GDMT as above  5. Hyperlipidemia - intolerant of statins - Continue Zetia  - previous repatha use - LDL 05/23/23 was 93   6. Morbid obesity - Body mass index is 46.12 kg/m. - ideally would use GLP1RA but does not have insurance   7. Tobacco / ETOH use - has cut smoking down 1ppd -> 1/2 ppd - Had 2 mugs of beer when eating out recently, nothing more frequent - Discussed need for cessation to permit advanced therapy consideration  8. OSA - needs sleep study but doesn't have insurance - applying for Medicaid   I spent a total of 43 minutes today: 1) reviewing the patient's medical records including previous charts, labs and recent notes from other providers; 2) examining the patient and counseling them on their medical issues/explaining the plan of care; 3) adjusting meds as needed and 4) ordering lab work or other needed tests.   Jules Oar, MD 07/04/23

## 2023-07-13 ENCOUNTER — Other Ambulatory Visit: Payer: Self-pay

## 2023-07-19 ENCOUNTER — Other Ambulatory Visit: Payer: Self-pay

## 2023-07-25 NOTE — Addendum Note (Signed)
 Encounter addended by: Stan Eans, RN on: 07/25/2023 12:13 PM  Actions taken: Imaging Exam ended

## 2023-08-07 ENCOUNTER — Ambulatory Visit (HOSPITAL_COMMUNITY): Payer: Self-pay | Admitting: Internal Medicine

## 2023-08-08 ENCOUNTER — Other Ambulatory Visit: Payer: Self-pay

## 2023-08-08 MED ORDER — MEXILETINE HCL 150 MG PO CAPS
300.0000 mg | ORAL_CAPSULE | Freq: Two times a day (BID) | ORAL | 6 refills | Status: DC
  Filled 2023-08-08 – 2023-08-25 (×2): qty 120, 30d supply, fill #0

## 2023-08-08 NOTE — Telephone Encounter (Signed)
-----   Message from Nurse Emer C sent at 08/08/2023 10:45 AM EDT -----  ----- Message ----- From: Mardell Shade, MD Sent: 08/07/2023   8:01 PM EDT To: [facility] Triage Pool  Still with very frequent PVCs. - increase mexilitene to 300 bid - please check if he can get sleep study yet - has been hesitant about ICD but I will need to f/u with him at next visit

## 2023-08-08 NOTE — Telephone Encounter (Signed)
 Spoke to pt about zio results. Pt agreeable to medication changes. Pt states that his medicaid was denied and he does not know what to do from here to appeal. So unfortunately he is unable to do sleep study. Verified upcoming appointment in June. No further questions at this time.

## 2023-08-21 ENCOUNTER — Other Ambulatory Visit: Payer: Self-pay

## 2023-08-25 ENCOUNTER — Other Ambulatory Visit: Payer: Self-pay

## 2023-08-30 ENCOUNTER — Encounter: Admitting: Internal Medicine

## 2023-09-07 ENCOUNTER — Other Ambulatory Visit: Payer: Self-pay

## 2023-09-11 ENCOUNTER — Other Ambulatory Visit: Payer: Self-pay

## 2023-09-12 ENCOUNTER — Other Ambulatory Visit: Payer: Self-pay

## 2023-09-14 ENCOUNTER — Emergency Department
Admission: EM | Admit: 2023-09-14 | Discharge: 2023-09-14 | Disposition: A | Discharge status: Home / Self Care | Attending: Emergency Medicine | Admitting: Emergency Medicine

## 2023-09-14 DIAGNOSIS — I5022 Chronic systolic (congestive) heart failure: Secondary | ICD-10-CM | POA: Insufficient documentation

## 2023-09-14 DIAGNOSIS — Z720 Tobacco use: Secondary | ICD-10-CM | POA: Insufficient documentation

## 2023-09-14 DIAGNOSIS — I130 Hypertensive heart and chronic kidney disease with heart failure and stage 1 through stage 4 chronic kidney disease, or unspecified chronic kidney disease: Secondary | ICD-10-CM | POA: Insufficient documentation

## 2023-09-14 DIAGNOSIS — M10061 Idiopathic gout, right knee: Secondary | ICD-10-CM | POA: Diagnosis not present

## 2023-09-14 DIAGNOSIS — N182 Chronic kidney disease, stage 2 (mild): Secondary | ICD-10-CM | POA: Diagnosis not present

## 2023-09-14 DIAGNOSIS — M25561 Pain in right knee: Secondary | ICD-10-CM | POA: Diagnosis present

## 2023-09-14 DIAGNOSIS — I509 Heart failure, unspecified: Secondary | ICD-10-CM

## 2023-09-14 DIAGNOSIS — E119 Type 2 diabetes mellitus without complications: Secondary | ICD-10-CM

## 2023-09-14 DIAGNOSIS — M25461 Effusion, right knee: Secondary | ICD-10-CM

## 2023-09-14 DIAGNOSIS — I2510 Atherosclerotic heart disease of native coronary artery without angina pectoris: Secondary | ICD-10-CM | POA: Insufficient documentation

## 2023-09-14 LAB — BASIC METABOLIC PANEL WITH GFR
Anion gap: 12 (ref 5–15)
BUN: 22 mg/dL — ABNORMAL HIGH (ref 6–20)
CO2: 24 mmol/L (ref 22–32)
Calcium: 9.1 mg/dL (ref 8.9–10.3)
Chloride: 99 mmol/L (ref 98–111)
Creatinine, Ser: 1.25 mg/dL — ABNORMAL HIGH (ref 0.61–1.24)
GFR, Estimated: >60 mL/min (ref >60)
Glucose, Bld: 119 mg/dL — ABNORMAL HIGH (ref 70–99)
Potassium: 3.5 mmol/L (ref 3.5–5.1)
Sodium: 135 mmol/L (ref 135–145)

## 2023-09-14 LAB — CBC WITH DIFFERENTIAL/PLATELET
Abs Immature Granulocytes: 0.04 10*3/uL (ref 0.00–0.07)
Basophils Absolute: 0.1 10*3/uL (ref 0.0–0.1)
Basophils Relative: 1 %
Eosinophils Absolute: 0.1 10*3/uL (ref 0.0–0.5)
Eosinophils Relative: 1 %
HCT: 41.9 % (ref 39.0–52.0)
Hemoglobin: 13.1 g/dL (ref 13.0–17.0)
Immature Granulocytes: 0 %
Lymphocytes Relative: 22 %
Lymphs Abs: 2.2 10*3/uL (ref 0.7–4.0)
MCH: 25.5 pg — ABNORMAL LOW (ref 26.0–34.0)
MCHC: 31.3 g/dL (ref 30.0–36.0)
MCV: 81.5 fL (ref 80.0–100.0)
Monocytes Absolute: 1.2 10*3/uL — ABNORMAL HIGH (ref 0.1–1.0)
Monocytes Relative: 11 %
Neutro Abs: 6.6 10*3/uL (ref 1.7–7.7)
Neutrophils Relative %: 65 %
Platelets: 324 10*3/uL (ref 150–400)
RBC: 5.14 MIL/uL (ref 4.22–5.81)
RDW: 17.2 % — ABNORMAL HIGH (ref 11.5–15.5)
WBC: 10.2 10*3/uL (ref 4.0–10.5)
nRBC: 0 % (ref 0.0–0.2)

## 2023-09-14 LAB — URIC ACID: Uric Acid, Serum: 10 mg/dL — ABNORMAL HIGH (ref 3.7–8.6)

## 2023-09-14 MED ORDER — COLCHICINE 0.6 MG PO TABS
0.6000 mg | ORAL_TABLET | ORAL | Status: AC
  Administered 2023-09-14: 0.6 mg via ORAL
  Filled 2023-09-14: qty 1

## 2023-09-14 MED ORDER — SPIRONOLACTONE 25 MG PO TABS: 25.0000 mg | ORAL_TABLET | Freq: Every day | ORAL | 0 refills | Status: DC

## 2023-09-14 MED ORDER — TORSEMIDE 20 MG PO TABS: 20.0000 mg | ORAL_TABLET | Freq: Every day | ORAL | 0 refills | Status: DC

## 2023-09-14 MED ORDER — ELIQUIS 5 MG PO TABS: 5.0000 mg | ORAL_TABLET | Freq: Two times a day (BID) | ORAL | 0 refills | Status: DC

## 2023-09-14 MED ORDER — ALLOPURINOL 100 MG PO TABS: 100.0000 mg | ORAL_TABLET | Freq: Every day | ORAL | 2 refills | Status: DC

## 2023-09-14 MED ORDER — NAPROXEN 500 MG PO TABS
500.0000 mg | ORAL_TABLET | Freq: Once | ORAL | Status: AC
  Administered 2023-09-14: 500 mg via ORAL
  Filled 2023-09-14: qty 1

## 2023-09-14 MED ORDER — NAPROXEN 500 MG PO TABS: 500.0000 mg | ORAL_TABLET | Freq: Two times a day (BID) | ORAL | 0 refills | Status: DC

## 2023-09-14 MED ORDER — EMPAGLIFLOZIN 10 MG PO TABS: 10.0000 mg | ORAL_TABLET | Freq: Every day | ORAL | 0 refills | Status: DC

## 2023-09-14 MED ORDER — EZETIMIBE 10 MG PO TABS: 10.0000 mg | ORAL_TABLET | Freq: Every day | ORAL | 0 refills | Status: DC

## 2023-09-14 MED ORDER — OXYCODONE-ACETAMINOPHEN 5-325 MG PO TABS
1.0000 | ORAL_TABLET | Freq: Once | ORAL | Status: AC
  Administered 2023-09-14: 1 via ORAL
  Filled 2023-09-14: qty 1

## 2023-09-14 NOTE — ED Triage Notes (Signed)
 Pt arrived POV for R knee pain. Patient thinks it may be gout. Patient has a hx of it but no meds for treatment. Pt c/o pain with ambulation and movement.

## 2023-09-14 NOTE — ED Provider Notes (Signed)
 [facility] Provider Note    Event Date/Time   First MD Initiated Contact with Patient 09/14/23 1640     (approximate)   History   Chief Complaint: Knee Pain   HPI  Philip Monroe is a 48 y.o. male with a history of CAD CKD gout hypertension morbid obesity who comes ED complaining of pain and swelling in the right knee for the past 2 days.  No injuries or falls.  Denies fever or chills.  Not sexually active, no penile discharge.        Past Medical History:  Diagnosis Date   Aortic root dilatation (HCC)    a. 07/2022 Echo: Ao root 4.4cm, Asc Ao 4.0cm.   CAD (coronary artery disease)    a. 08/2017 Cath: LM nl, LAD 65m/d, D1 70ost, LCX 140m, OM1 90 inf branch, 60 sup branch, OM2 small, RCA 30 diffuse, RPDA 100 w/ L->R collats; b. 07/2022 Echo: LM nl, LAD 50m, D1 80, LCX 160m, OM1 80, RCA 44m, RPDA 100-->No targets for PCI/CABG-->Med rx.   Chronic HFrEF (heart failure with reduced ejection fraction) (HCC)    a. 03/2015 Echo: EF 35-40%; b. 08/2017 Echo: EF 15%; c. 08/2017 cMRI: EF 35%; d. 06/2018 Echo: EF 10-15%; e. 07/2022 Echo: EF 15-20%, grIII DD, mild MR, sev dil LA, mod-sev PH, Ao root 4.4cm, Asc Ao 4.0cm; f. 07/2022 RHC: RA 4, RV 38/3, PA 43/18 (26), PCWP 11. CO/CI 5.3/1.8.   CKD (chronic kidney disease), stage II    Gout    Hyperlipidemia LDL goal <70    Hypertension    Ischemic cardiomyopathy    a. 03/2015 Echo: EF 35-40%; b. 08/2017 Echo: EF 15%; c. 08/2017 cMRI: EF 35%; d. 06/2018 Echo: EF 10-15%; e. 07/2022 Echo: EF 15-20%, grIII DD.   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 09/2022 Event monitor: predominantly sinus rhythm, avg HR 81 (46-167). 17 runs NSVT (fastest/longest 13 beats x 167). Rare PACs, 17.5% PVC burden.   PVC's (premature ventricular contractions)    a. 09/2022 Event Monitor: 17.5% PVC burden.   Statin intolerance     Tobacco abuse     Current Outpatient Rx   Order #: [id number] Class: Normal   Order #: [id number] Class: Normal   Order #: [id number] Class: Normal   Order #: [id number] Class: Normal   Order #: [id number] Class: Normal   Order #: [id number] Class: Normal   Order #: [id number] Class: Normal   Order #: [id number] Class: Normal   Order #: [id number] Class: Normal   Order #: [id number] Class: Normal   Order #: [id number] Class: Normal   Order #: [id number] Class: Normal   Order #: [id number] Class: Normal   Order #: [id number] Class: Normal    No past surgical history on file.  Physical Exam   Triage Vital Signs: ED Triage Vitals  Encounter Vitals Group     BP 09/14/23 1631 (!) 146/115     Girls Systolic BP Percentile --      Girls Diastolic BP Percentile --      Boys Systolic BP Percentile --      Boys Diastolic BP Percentile --      Pulse Rate 09/14/23 1631 (!) 44     Resp 09/14/23 1631 16     Temp 09/14/23 1631 98.2 F (36.8 C)     Temp Source 09/14/23 1631 Oral     SpO2 09/14/23 1631 97 %  Weight 09/14/23 1633 (!) 360 lb (163.3 kg)     Height 09/14/23 1633 6' 3 (1.905 m)     Head Circumference --      Peak Flow --      Pain Score 09/14/23 1632 2     Pain Loc --      Pain Education --      Exclude from Growth Chart --     Most recent vital signs: Vitals:   09/14/23 1631  BP: (!) 146/115  Pulse: (!) 44  Resp: 16  Temp: 98.2 F (36.8 C)  SpO2: 97%    General: Awake, no distress.  CV:  Good peripheral perfusion.  Normal DP pulse, regular rate Resp:  Normal effort.  Abd:  No distention.  Other:  Mild right knee effusion on exam.  No joint line tenderness or focal bony tenderness.  Joint is stable.   ED Results / Procedures / Treatments   Labs (all labs ordered are listed, but only abnormal results are displayed) Labs Reviewed  CBC WITH DIFFERENTIAL/PLATELET - Abnormal; Notable for the following components:      Result Value   MCH 25.5 (*)    RDW 17.2 (*)    Monocytes  Absolute 1.2 (*)    All other components within normal limits  BASIC METABOLIC PANEL WITH GFR - Abnormal; Notable for the following components:   Glucose, Bld 119 (*)    BUN 22 (*)    Creatinine, Ser 1.25 (*)    All other components within normal limits  URIC ACID - Abnormal; Notable for the following components:   Uric Acid, Serum 10.0 (*)    All other components within normal limits     EKG    RADIOLOGY    PROCEDURES:  Procedures   MEDICATIONS ORDERED IN ED: Medications  colchicine tablet 0.6 mg (has no administration in time range)  oxyCODONE-acetaminophen  (PERCOCET/ROXICET) 5-325 MG per tablet 1 tablet (1 tablet Oral Given 09/14/23 1723)  colchicine tablet 0.6 mg (0.6 mg Oral Given 09/14/23 1829)  naproxen (NAPROSYN) tablet 500 mg (500 mg Oral Given 09/14/23 1829)     IMPRESSION / MDM / ASSESSMENT AND PLAN / ED COURSE  I reviewed the triage vital signs and the nursing notes.  DDx: Gout, electrolyte derangement, AKI, unlikely septic arthritis  Patient's presentation is most consistent with acute presentation with potential threat to life or bodily function.  Patient presents with right knee pain, most likely gout.  He is afebrile.  Will check labs, if no leukocytosis proceed with gout management.    ----------------------------------------- 7:02 PM on 09/14/2023 ----------------------------------------- Labs normal, uric acid elevated.  GFR greater than 60.  Colchicine given, will continue NSAIDs.  Refills for home medications provided as well.     FINAL CLINICAL IMPRESSION(S) / ED DIAGNOSES   Final diagnoses:  Acute idiopathic gout of right knee  Effusion of right knee  Chronic heart failure, unspecified heart failure type (HCC)  Type 2 diabetes mellitus without complication, without long-term current use of insulin (HCC)     Rx / DC Orders   ED Discharge Orders          Ordered    empagliflozin  (JARDIANCE ) 10 MG TABS tablet  Daily        09/14/23  1900    ezetimibe  (ZETIA ) 10 MG tablet  Daily at bedtime        09/14/23 1900    spironolactone  (ALDACTONE ) 25 MG tablet  Daily  09/14/23 1900    torsemide  (DEMADEX ) 20 MG tablet  Daily        09/14/23 1900    ELIQUIS  5 MG TABS tablet  2 times daily        09/14/23 1900    allopurinol  (ZYLOPRIM ) 100 MG tablet  Daily        09/14/23 1900    Ambulatory Referral to Primary Care (Establish Care)  Status:  Canceled        09/14/23 1901    Ambulatory Referral to Primary Care (Establish Care)        09/14/23 1901    naproxen (NAPROSYN) 500 MG tablet  2 times daily with meals        09/14/23 1902             Note:  This document was prepared using Dragon voice recognition software and may include unintentional dictation errors.   Viviann Pastor, MD 09/14/23 RETHA

## 2023-09-25 ENCOUNTER — Other Ambulatory Visit: Payer: Self-pay

## 2023-09-25 ENCOUNTER — Observation Stay
Admission: EM | Admit: 2023-09-25 | Discharge: 2023-09-26 | Disposition: A | Discharge status: Home / Self Care | Attending: Emergency Medicine | Admitting: Emergency Medicine

## 2023-09-25 ENCOUNTER — Emergency Department

## 2023-09-25 ENCOUNTER — Observation Stay

## 2023-09-25 DIAGNOSIS — I5023 Acute on chronic systolic (congestive) heart failure: Principal | ICD-10-CM | POA: Insufficient documentation

## 2023-09-25 DIAGNOSIS — F17200 Nicotine dependence, unspecified, uncomplicated: Secondary | ICD-10-CM | POA: Diagnosis present

## 2023-09-25 DIAGNOSIS — I130 Hypertensive heart and chronic kidney disease with heart failure and stage 1 through stage 4 chronic kidney disease, or unspecified chronic kidney disease: Secondary | ICD-10-CM | POA: Insufficient documentation

## 2023-09-25 DIAGNOSIS — R0602 Shortness of breath: Secondary | ICD-10-CM | POA: Diagnosis present

## 2023-09-25 DIAGNOSIS — Z79899 Other long term (current) drug therapy: Secondary | ICD-10-CM | POA: Diagnosis not present

## 2023-09-25 DIAGNOSIS — Z7982 Long term (current) use of aspirin: Secondary | ICD-10-CM | POA: Diagnosis not present

## 2023-09-25 DIAGNOSIS — F17210 Nicotine dependence, cigarettes, uncomplicated: Secondary | ICD-10-CM | POA: Diagnosis not present

## 2023-09-25 DIAGNOSIS — I2510 Atherosclerotic heart disease of native coronary artery without angina pectoris: Secondary | ICD-10-CM | POA: Diagnosis not present

## 2023-09-25 DIAGNOSIS — M25461 Effusion, right knee: Principal | ICD-10-CM

## 2023-09-25 DIAGNOSIS — I493 Ventricular premature depolarization: Secondary | ICD-10-CM | POA: Diagnosis not present

## 2023-09-25 DIAGNOSIS — I5022 Chronic systolic (congestive) heart failure: Secondary | ICD-10-CM | POA: Diagnosis present

## 2023-09-25 DIAGNOSIS — N182 Chronic kidney disease, stage 2 (mild): Secondary | ICD-10-CM | POA: Diagnosis not present

## 2023-09-25 LAB — BRAIN NATRIURETIC PEPTIDE: B Natriuretic Peptide: 1509.6 pg/mL — ABNORMAL HIGH (ref 0.0–100.0)

## 2023-09-25 LAB — BASIC METABOLIC PANEL WITH GFR
Anion gap: 14 (ref 5–15)
BUN: 23 mg/dL — ABNORMAL HIGH (ref 6–20)
CO2: 21 mmol/L — ABNORMAL LOW (ref 22–32)
Calcium: 9.2 mg/dL (ref 8.9–10.3)
Chloride: 102 mmol/L (ref 98–111)
Creatinine, Ser: 1.27 mg/dL — ABNORMAL HIGH (ref 0.61–1.24)
GFR, Estimated: >60 mL/min (ref >60)
Glucose, Bld: 130 mg/dL — ABNORMAL HIGH (ref 70–99)
Potassium: 3.3 mmol/L — ABNORMAL LOW (ref 3.5–5.1)
Sodium: 137 mmol/L (ref 135–145)

## 2023-09-25 LAB — CBC
HCT: 42.7 % (ref 39.0–52.0)
Hemoglobin: 13.6 g/dL (ref 13.0–17.0)
MCH: 25.3 pg — ABNORMAL LOW (ref 26.0–34.0)
MCHC: 31.9 g/dL (ref 30.0–36.0)
MCV: 79.5 fL — ABNORMAL LOW (ref 80.0–100.0)
Platelets: 363 K/uL (ref 150–400)
RBC: 5.37 MIL/uL (ref 4.22–5.81)
RDW: 17.2 % — ABNORMAL HIGH (ref 11.5–15.5)
WBC: 10.4 K/uL (ref 4.0–10.5)
nRBC: 0 % (ref 0.0–0.2)

## 2023-09-25 LAB — MAGNESIUM: Magnesium: 1.9 mg/dL (ref 1.7–2.4)

## 2023-09-25 MED ORDER — PREDNISONE 10 MG (21) PO TBPK: 20.0000 mg | ORAL_TABLET | Freq: Every evening | ORAL | Status: DC

## 2023-09-25 MED ORDER — MORPHINE SULFATE (PF) 4 MG/ML IV SOLN
6.0000 mg | Freq: Once | INTRAVENOUS | Status: AC
  Administered 2023-09-25: 6 mg via INTRAVENOUS
  Filled 2023-09-25: qty 2

## 2023-09-25 MED ORDER — FUROSEMIDE 10 MG/ML IJ SOLN
60.0000 mg | Freq: Once | INTRAMUSCULAR | Status: AC
  Administered 2023-09-25: 60 mg via INTRAVENOUS
  Filled 2023-09-25: qty 8

## 2023-09-25 MED ORDER — PREDNISONE 10 MG (21) PO TBPK
20.0000 mg | ORAL_TABLET | Freq: Every morning | ORAL | Status: DC
  Filled 2023-09-25: qty 21

## 2023-09-25 MED ORDER — NICOTINE 7 MG/24HR TD PT24
7.0000 mg | MEDICATED_PATCH | Freq: Every day | TRANSDERMAL | Status: DC
  Administered 2023-09-25 – 2023-09-26 (×2): 7 mg via TRANSDERMAL
  Filled 2023-09-25 (×2): qty 1

## 2023-09-25 MED ORDER — LOSARTAN POTASSIUM 50 MG PO TABS
25.0000 mg | ORAL_TABLET | Freq: Every day | ORAL | Status: DC
Start: 2023-09-25 — End: 2023-09-26
  Administered 2023-09-25 – 2023-09-26 (×2): 25 mg via ORAL
  Filled 2023-09-25 (×2): qty 1

## 2023-09-25 MED ORDER — ACETAMINOPHEN 650 MG RE SUPP: 650.0000 mg | Freq: Four times a day (QID) | RECTAL | Status: DC | PRN

## 2023-09-25 MED ORDER — PREDNISONE 10 MG (21) PO TBPK: 10.0000 mg | ORAL_TABLET | ORAL | Status: DC

## 2023-09-25 MED ORDER — PREDNISONE 10 MG (21) PO TBPK: 10.0000 mg | ORAL_TABLET | Freq: Three times a day (TID) | ORAL | Status: DC

## 2023-09-25 MED ORDER — EZETIMIBE 10 MG PO TABS
10.0000 mg | ORAL_TABLET | Freq: Every day | ORAL | Status: DC
  Administered 2023-09-25: 10 mg via ORAL
  Filled 2023-09-25: qty 1

## 2023-09-25 MED ORDER — MEXILETINE HCL 150 MG PO CAPS
300.0000 mg | ORAL_CAPSULE | Freq: Two times a day (BID) | ORAL | Status: DC
  Administered 2023-09-25 – 2023-09-26 (×2): 300 mg via ORAL
  Filled 2023-09-25 (×2): qty 2

## 2023-09-25 MED ORDER — EMPAGLIFLOZIN 10 MG PO TABS
10.0000 mg | ORAL_TABLET | Freq: Every day | ORAL | Status: DC
  Administered 2023-09-25 – 2023-09-26 (×2): 10 mg via ORAL
  Filled 2023-09-25 (×2): qty 1

## 2023-09-25 MED ORDER — PREDNISONE 10 MG (21) PO TBPK
ORAL_TABLET | Freq: Once | ORAL | Status: DC
  Filled 2023-09-25: qty 21

## 2023-09-25 MED ORDER — ACETAMINOPHEN 325 MG PO TABS: 650.0000 mg | ORAL_TABLET | Freq: Four times a day (QID) | ORAL | Status: DC | PRN

## 2023-09-25 MED ORDER — ASPIRIN 81 MG PO CHEW
81.0000 mg | CHEWABLE_TABLET | Freq: Every day | ORAL | Status: DC
  Administered 2023-09-25 – 2023-09-26 (×2): 81 mg via ORAL
  Filled 2023-09-25 (×2): qty 1

## 2023-09-25 MED ORDER — PREDNISONE 20 MG PO TABS: 40.0000 mg | ORAL_TABLET | Freq: Every day | ORAL | Status: DC

## 2023-09-25 MED ORDER — PREDNISONE 20 MG PO TABS: 30.0000 mg | ORAL_TABLET | Freq: Every day | ORAL | Status: DC

## 2023-09-25 MED ORDER — PREDNISONE 20 MG PO TABS: 20.0000 mg | ORAL_TABLET | Freq: Every day | ORAL | Status: DC

## 2023-09-25 MED ORDER — PREDNISONE 10 MG (21) PO TBPK: 10.0000 mg | ORAL_TABLET | Freq: Four times a day (QID) | ORAL | Status: DC

## 2023-09-25 MED ORDER — COLCHICINE 0.6 MG PO TABS
0.6000 mg | ORAL_TABLET | Freq: Two times a day (BID) | ORAL | Status: DC
  Administered 2023-09-25 – 2023-09-26 (×2): 0.6 mg via ORAL
  Filled 2023-09-25 (×2): qty 1

## 2023-09-25 MED ORDER — PREDNISONE 20 MG PO TABS
50.0000 mg | ORAL_TABLET | Freq: Once | ORAL | Status: AC
  Administered 2023-09-25: 50 mg via ORAL
  Filled 2023-09-25: qty 1

## 2023-09-25 MED ORDER — POTASSIUM CHLORIDE CRYS ER 20 MEQ PO TBCR
40.0000 meq | EXTENDED_RELEASE_TABLET | Freq: Once | ORAL | Status: AC
  Administered 2023-09-25: 40 meq via ORAL
  Filled 2023-09-25: qty 2

## 2023-09-25 MED ORDER — PREDNISONE 50 MG PO TABS
50.0000 mg | ORAL_TABLET | Freq: Every day | ORAL | Status: AC
  Administered 2023-09-26: 50 mg via ORAL
  Filled 2023-09-25: qty 1

## 2023-09-25 MED ORDER — METOPROLOL SUCCINATE ER 25 MG PO TB24
25.0000 mg | ORAL_TABLET | Freq: Every day | ORAL | Status: DC
Start: 2023-09-25 — End: 2023-09-26
  Administered 2023-09-25 – 2023-09-26 (×2): 25 mg via ORAL
  Filled 2023-09-25 (×2): qty 1

## 2023-09-25 MED ORDER — PREDNISONE 10 MG PO TABS: 10.0000 mg | ORAL_TABLET | Freq: Every day | ORAL | Status: DC

## 2023-09-25 MED ORDER — MORPHINE SULFATE (PF) 4 MG/ML IV SOLN
4.0000 mg | INTRAVENOUS | Status: DC | PRN
  Administered 2023-09-25: 4 mg via INTRAVENOUS
  Filled 2023-09-25: qty 1

## 2023-09-25 MED ORDER — PREDNISONE 50 MG PO TABS
60.0000 mg | ORAL_TABLET | Freq: Once | ORAL | Status: AC
  Administered 2023-09-25: 60 mg via ORAL
  Filled 2023-09-25: qty 1

## 2023-09-25 MED ORDER — SPIRONOLACTONE 25 MG PO TABS
25.0000 mg | ORAL_TABLET | Freq: Every day | ORAL | Status: DC
  Administered 2023-09-25 – 2023-09-26 (×2): 25 mg via ORAL
  Filled 2023-09-25 (×2): qty 1

## 2023-09-25 MED ORDER — TORSEMIDE 20 MG PO TABS
20.0000 mg | ORAL_TABLET | Freq: Every day | ORAL | Status: DC
  Administered 2023-09-25 – 2023-09-26 (×2): 20 mg via ORAL
  Filled 2023-09-25 (×2): qty 1

## 2023-09-25 MED ORDER — APIXABAN 5 MG PO TABS
5.0000 mg | ORAL_TABLET | Freq: Two times a day (BID) | ORAL | Status: DC
  Administered 2023-09-25 – 2023-09-26 (×2): 5 mg via ORAL
  Filled 2023-09-25 (×2): qty 1

## 2023-09-25 MED ORDER — HYDROCODONE-ACETAMINOPHEN 5-325 MG PO TABS
1.0000 | ORAL_TABLET | ORAL | Status: DC | PRN
  Administered 2023-09-25: 1 via ORAL
  Filled 2023-09-25: qty 1

## 2023-09-25 NOTE — H&P (Signed)
 History and Physical    [name] FMW:[id number] DOB: 1976-01-23 DOA: 09/25/2023 DOS: the patient was seen and examined on 09/25/2023 PCP: Pcp, No  Patient coming from: Home  Chief Complaint:  Chief Complaint  Patient presents with   Leg Swelling   Shortness of Breath   HPI: [name] is a 48 y.o. male with medical history significant of multivessel CAD, chronic systolic heart failure, mild pulmonary hypertension, presenting to the emergency department for evaluation of right leg pain and swelling that is been present for about 2 weeks.  Pain is worse with movement and weightbearing, no relieving factors.  He was in the emergency department on the third of this month (11 days ago) and diagnosed with gout.  At that time he was prescribed colchicine  and NSAIDs which provided temporary relief, however he is now out of these medications and the symptoms recurred.  He denies associated fever, chills, nausea, penetrating trauma, rashes, numbness or tingling of the leg or foot. Reports chronic SOB and dry cough.  He has had similar episodes of pain and swelling in his ankles since he was young.  In the ED he was afebrile, hypertensive, saturating appropriately on room air. Multiple PVC's noted on the monitor, patient has known history of this and is being followed closely by Cardiology. No leukocytosis.  BNP >1500,  CXR unremarkable.  RLE doppler negative for DVT, but did reveal right knee effusion. He was given a dose of steroids and lasix  and Hospitalist consulted for admission.   Review of Systems: Review of Systems  Constitutional:  Positive for chills. Negative for fever, malaise/fatigue and weight loss.  Respiratory:  Positive for cough and shortness of breath. Negative for sputum production and wheezing.   Cardiovascular:  Positive for leg swelling. Negative for chest pain and palpitations.  Gastrointestinal:  Negative for abdominal pain, constipation, diarrhea, nausea and  vomiting.  Genitourinary:  Negative for dysuria, frequency and urgency.  Musculoskeletal:  Positive for joint pain. Negative for back pain and neck pain.  Skin:  Negative for itching and rash.  Neurological:  Negative for dizziness, focal weakness and loss of consciousness.  Psychiatric/Behavioral:  Negative for substance abuse and suicidal ideas.     Past Medical History:  Diagnosis Date   Aortic root dilatation (HCC)    a. 07/2022 Echo: Ao root 4.4cm, Asc Ao 4.0cm.   CAD (coronary artery disease)    a. 08/2017 Cath: LM nl, LAD 60m/d, D1 70ost, LCX 125m, OM1 90 inf branch, 60 sup branch, OM2 small, RCA 30 diffuse, RPDA 100 w/ L->R collats; b. 07/2022 Echo: LM nl, LAD 33m, D1 80, LCX 161m, OM1 80, RCA 81m, RPDA 100-->No targets for PCI/CABG-->Med rx.   CHF (congestive heart failure) (HCC)    takes lasix    Chronic HFrEF (heart failure with reduced ejection fraction) (HCC)    a. 03/2015 Echo: EF 35-40%; b. 08/2017 Echo: EF 15%; c. 08/2017 cMRI: EF 35%; d. 06/2018 Echo: EF 10-15%; e. 07/2022 Echo: EF 15-20%, grIII DD, mild MR, sev dil LA, mod-sev PH, Ao root 4.4cm, Asc Ao 4.0cm; f. 07/2022 RHC: RA 4, RV 38/3, PA 43/18 (26), PCWP 11. CO/CI 5.3/1.8.   CKD (chronic kidney disease), stage II    Gout    Hyperlipidemia LDL goal <70    Hypertension    Ischemic cardiomyopathy    a. 03/2015 Echo: EF 35-40%; b. 08/2017 Echo: EF 15%; c. 08/2017 cMRI: EF 35%; d. 06/2018 Echo: EF 10-15%; e. 07/2022 Echo: EF 15-20%,  grIII DD.   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 09/2022 Event monitor: predominantly sinus rhythm, avg HR 81 (46-167). 17 runs NSVT (fastest/longest 13 beats x 167). Rare PACs, 17.5% PVC burden.   PVC's (premature ventricular contractions)    a. 09/2022 Event Monitor: 17.5% PVC burden.   Statin intolerance    Tobacco abuse    History reviewed. No pertinent surgical  history. Social History:  reports that he has been smoking cigarettes. He has never used smokeless tobacco. He reports current alcohol use of about 36.0 standard drinks of alcohol per week. He reports that he does not use drugs.  Allergies  Allergen Reactions   Lactose Intolerance (Gi) Diarrhea   Statins     myalgias    History reviewed. No pertinent family history.  Prior to Admission medications   Medication Sig Start Date End Date Taking? Authorizing Provider  allopurinol  (ZYLOPRIM ) 100 MG tablet Take 1 tablet (100 mg total) by mouth daily. 09/14/23 12/13/23  Viviann Pastor, MD  aspirin  81 MG chewable tablet Chew 1 tablet (81 mg total) by mouth daily. 05/24/23   Amin, Sumayya, MD  cyanocobalamin  (VITAMIN B12) 1000 MCG tablet Take 1 tablet (1,000 mcg total) by mouth daily. 05/24/23   Amin, Sumayya, MD  ELIQUIS  5 MG TABS tablet Take 1 tablet (5 mg total) by mouth 2 (two) times daily. 09/14/23 12/13/23  Viviann Pastor, MD  empagliflozin  (JARDIANCE ) 10 MG TABS tablet Take 1 tablet (10 mg total) by mouth daily. 09/14/23 12/13/23  Viviann Pastor, MD  ezetimibe  (ZETIA ) 10 MG tablet Take 1 tablet (10 mg total) by mouth at bedtime. 09/14/23 12/13/23  Viviann Pastor, MD  HYDROcodone -acetaminophen  (NORCO/VICODIN) 5-325 MG tablet Take 1 tablet by mouth every 4 (four) hours as needed. 06/21/23   Margrette, Myah A, PA-C  losartan  (COZAAR ) 25 MG tablet Take 1 tablet (25 mg total) by mouth daily. 06/22/23   Donette Ellouise LABOR, FNP  metoprolol  succinate (TOPROL -XL) 25 MG 24 hr tablet Take 1 tablet (25 mg total) by mouth daily. 07/04/23   Bensimhon, Toribio SAUNDERS, MD  mexiletine (MEXITIL ) 150 MG capsule Take 2 capsules (300 mg total) by mouth 2 (two) times daily. 08/08/23   Bensimhon, Daniel R, MD  Multiple Vitamin (MULTIVITAMIN WITH MINERALS) TABS tablet Take 1 tablet by mouth daily. 05/24/23   Amin, Sumayya, MD  naproxen  (NAPROSYN ) 500 MG tablet Take 1 tablet (500 mg total) by mouth 2 (two) times daily with a meal.  09/14/23   Viviann Pastor, MD  spironolactone  (ALDACTONE ) 25 MG tablet Take 1 tablet (25 mg total) by mouth daily. 09/14/23 12/13/23  Viviann Pastor, MD  torsemide  (DEMADEX ) 20 MG tablet Take 1 tablet (20 mg total) by mouth daily. 09/14/23 12/13/23  Viviann Pastor, MD    Physical Exam: Vitals:   09/25/23 1230 09/25/23 1400 09/25/23 1530 09/25/23 1545  BP: (!) 118/103 (!) 112/100 (!) 142/70   Pulse: (!) 39 (!) 40 (!) 34 71  Resp: (!) 21 (!) 21 19 16   Temp:      SpO2: 100% 96% 94% 97%  Weight:      Height:      Physical Exam Vitals and nursing note reviewed.  Constitutional:      General: He is not in acute distress.    Appearance: He is obese. He is not ill-appearing or toxic-appearing.  HENT:     Head: Normocephalic  and atraumatic.  Eyes:     Extraocular Movements: Extraocular movements intact.     Pupils: Pupils are equal, round, and reactive to light.  Cardiovascular:     Rate and Rhythm: Normal rate. Rhythm irregular.  Pulmonary:     Effort: Pulmonary effort is normal. No tachypnea or accessory muscle usage.     Breath sounds: Normal breath sounds.  Chest:     Chest wall: No tenderness or edema.  Abdominal:     General: Bowel sounds are normal. There is no distension.     Palpations: Abdomen is soft.     Tenderness: There is no abdominal tenderness.  Musculoskeletal:     Cervical back: Neck supple.     Right knee: Swelling and effusion present. No ecchymosis or lacerations. Decreased range of motion. Tenderness present. Normal pulse.     Left knee: No effusion or erythema. Normal range of motion. No tenderness. Normal pulse.     Right lower leg: Tenderness present. 2+ Edema present.     Left lower leg: Tenderness present. 1+ Edema present.     Right ankle: Normal.     Left ankle: Normal.     Right foot: Swelling present. Normal pulse.     Left foot: Swelling present. Normal pulse.  Skin:    General: Skin is warm and dry.     Capillary Refill: Capillary refill takes  less than 2 seconds.  Neurological:     General: No focal deficit present.     Mental Status: He is alert and oriented to person, place, and time.  Psychiatric:        Mood and Affect: Mood normal.        Behavior: Behavior normal.     Data Reviewed:    Labs on Admission: I have personally reviewed following labs and imaging studies  CBC: Recent Labs  Lab 09/25/23 0958  WBC 10.4  HGB 13.6  HCT 42.7  MCV 79.5*  PLT 363   Basic Metabolic Panel: Recent Labs  Lab 09/25/23 0958  NA 137  K 3.3*  CL 102  CO2 21*  GLUCOSE 130*  BUN 23*  CREATININE 1.27*  CALCIUM 9.2  MG 1.9   GFR: Estimated Creatinine Clearance: 122.6 mL/min (A) (by C-G formula based on SCr of 1.27 mg/dL (H)). Liver Function Tests: No results for input(s): AST, ALT, ALKPHOS, BILITOT, PROT, ALBUMIN in the last 168 hours. No results for input(s): LIPASE, AMYLASE in the last 168 hours. No results for input(s): AMMONIA in the last 168 hours. Coagulation Profile: No results for input(s): INR, PROTIME in the last 168 hours. Cardiac Enzymes: No results for input(s): CKTOTAL, CKMB, CKMBINDEX, TROPONINI in the last 168 hours. BNP (last 3 results) No results for input(s): PROBNP in the last 8760 hours. HbA1C: No results for input(s): HGBA1C in the last 72 hours. CBG: No results for input(s): GLUCAP in the last 168 hours. Lipid Profile: No results for input(s): CHOL, HDL, LDLCALC, TRIG, CHOLHDL, LDLDIRECT in the last 72 hours. Thyroid Function Tests: No results for input(s): TSH, T4TOTAL, FREET4, T3FREE, THYROIDAB in the last 72 hours. Anemia Panel: No results for input(s): VITAMINB12, FOLATE, FERRITIN, TIBC, IRON, RETICCTPCT in the last 72 hours. Urine analysis: No results found for: COLORURINE, APPEARANCEUR, LABSPEC, PHURINE, GLUCOSEU, HGBUR, BILIRUBINUR, KETONESUR, PROTEINUR, UROBILINOGEN, NITRITE,  LEUKOCYTESUR  Radiological Exams on Admission: US  Venous Img Lower Unilateral Right Result Date: 09/25/2023 CLINICAL DATA:  SWELLING, PAIN. EVAL DVT. HX CHF EXAM: RIGHT LOWER EXTREMITY VENOUS DOPPLER ULTRASOUND TECHNIQUE: Gray-scale  sonography with graded compression, as well as color Doppler and duplex ultrasound were performed to evaluate the lower extremity deep venous systems from the level of the common femoral vein and including the common femoral, femoral, profunda femoral, popliteal and calf veins including the posterior tibial, peroneal and gastrocnemius veins when visible. The superficial great saphenous vein was also interrogated. Spectral Doppler was utilized to evaluate flow at rest and with distal augmentation maneuvers in the common femoral, femoral and popliteal veins. COMPARISON:  None Available. FINDINGS: Contralateral Common Femoral Vein: Respiratory phasicity is normal and symmetric with the symptomatic side. No evidence of thrombus. Normal compressibility. Common Femoral Vein: No evidence of thrombus. Normal compressibility, respiratory phasicity and response to augmentation. Saphenofemoral Junction: No evidence of thrombus. Normal compressibility and flow on color Doppler imaging. Profunda Femoral Vein: No evidence of thrombus. Normal compressibility and flow on color Doppler imaging. Femoral Vein: No evidence of thrombus. Normal compressibility, respiratory phasicity and response to augmentation. Popliteal Vein: No evidence of thrombus. Normal compressibility, respiratory phasicity and response to augmentation. Calf Veins: No evidence of thrombus. Normal compressibility and flow on color Doppler imaging. Superficial Great Saphenous Vein: No evidence of thrombus. Normal compressibility. Other Findings: Moderate knee joint effusion in the area of patient's pain. IMPRESSION: 1. Negative for deep venous thrombosis in the right leg. 2. Moderate knee joint effusion in the area of patient's  pain. Electronically Signed   By: Rogelia Myers M.D.   On: 09/25/2023 13:29   DG Chest 2 View Result Date: 09/25/2023 CLINICAL DATA:  Shortness of breath. EXAM: CHEST - 2 VIEW COMPARISON:  May 19, 2023 FINDINGS: Stable cardiomegaly. Both lungs are clear. The visualized skeletal structures are unremarkable. IMPRESSION: No active cardiopulmonary disease. Electronically Signed   By: Lynwood Landy Raddle M.D.   On: 09/25/2023 10:26       Assessment and Plan: No notes have been filed under this hospital service. Service: Hospitalist   48 y.o. male with medical history significant of multivessel CAD, chronic systolic heart failure, mild pulmonary hypertension, admitted for further evaluation/management of left knee effusion in setting of mild CHF exacerbation   R knee effusion  - suspect inflammatory more so than infectious etiology at this time.  No DVT on RLE doppler.  Will get dedicated R knee XR to assess for any OA -- discussed briefly with on call Orthopedic Surgery, recommends colchicine , medrol dose pack and outpatient follow up  - low suspicion for septic joint at this time, will hold abx for now. If rapid symptomatic improvement not observed with steroid given in the ED consider empiric abx/arthrocentesis to r/o septic joint.   -- pain control, hold NSAIDS   -- physical therapy   Chronic systolic heart failure CAD  Frequent PVCs - Echo 3/25 EF less than 20%.  No supplemental O2 requirement or vascular congestion on CXR, but with significant peripheral edema (in the above setting)  --  will continue torsemide  and spironolactone  for now -- cont eliquis  in setting of potential LV non-compaction cardiomyopathy (most recent office visit note reviewed)  -- continue other GDMT and mexiletine  -- Follow-up with primary cardiologist, pending ICD placement -- monitor I/O, daily weights   Tobacco use d/o  - nicotine  patch - cessation encouraged   Morbid obesity    Eliquis  No IVF   Monitor/replace electrolytes  Heart healthy diet   Advance Care Planning:   Code Status: Full Code discussed with patient at time of admission     Severity of Illness: The  appropriate patient status for this patient is OBSERVATION. Observation status is judged to be reasonable and necessary in order to provide the required intensity of service to ensure the patient's safety. The patient's presenting symptoms, physical exam findings, and initial radiographic and laboratory data in the context of their medical condition is felt to place them at decreased risk for further clinical deterioration. Furthermore, it is anticipated that the patient will be medically stable for discharge from the hospital within 2 midnights of admission.   Author: Daved JAYSON Pump, DO 09/25/2023 3:59 PM  For on call review [email or web address].

## 2023-09-25 NOTE — Progress Notes (Signed)
 Patient arrived to room and placed on tele. Pt is alert and oriented x4 and is walking with assistance of crutches. Pt educated on fall risk and asked to call for assistance. Pt verbalizes understanding. Bed locked and low,  call bell in reach, phone at bedside, urinal at bedside. Pt educated to call when he uses urinal to keep track of urine. Pt verbalizes understanding.

## 2023-09-25 NOTE — ED Triage Notes (Signed)
 Pt to ED for RLE swelling and pain since over 1 week. RLE is tight and swollen with +1 pitting. Has pain to sides of knee but not calf. Also c/o SOB with walking and orthopnea since last 2 days. Hx CHF, on lasix .  Blue top sent.  Slightly tachypneic, respirations unlabored. Skin dry.

## 2023-09-25 NOTE — ED Provider Notes (Signed)
 [facility] Provider Note    Event Date/Time   First MD Initiated Contact with Patient 09/25/23 1015     (approximate)   History   Leg Swelling and Shortness of Breath   HPI  Philip Monroe is a 48 y.o. male who presents to the ED for evaluation of Leg Swelling and Shortness of Breath   Review of CHF clinic visit from April.  Morbidly obese patient with history of multivessel CAD, ischemic cardiomyopathy with noncompaction, HTN, ethanol abuse, noncompliance.  Echo in March with EF less than 20% Patient seen in the ED 11 days ago for concerns for gout of the right knee.  Provided colchicine   Patient presents to the ED for evaluation of worsening right leg pain, diffuse right leg swelling, orthopnea and dyspnea on exertion.  Reports running out of few his medications, including spironolactone .  Said no fevers, cough, chest pain   Physical Exam   Triage Vital Signs: ED Triage Vitals  Encounter Vitals Group     BP 09/25/23 0957 (!) 145/107     Girls Systolic BP Percentile --      Girls Diastolic BP Percentile --      Boys Systolic BP Percentile --      Boys Diastolic BP Percentile --      Pulse Rate 09/25/23 0957 (!) 45     Resp 09/25/23 0957 (!) 22     Temp 09/25/23 0957 97.6 F (36.4 C)     Temp src --      SpO2 09/25/23 0957 94 %     Weight 09/25/23 0955 (!) 385 lb (174.6 kg)     Height 09/25/23 0955 6' 3 (1.905 m)     Head Circumference --      Peak Flow --      Pain Score 09/25/23 0953 10     Pain Loc --      Pain Education --      Exclude from Growth Chart --     Most recent vital signs: Vitals:   09/25/23 1215 09/25/23 1230  BP: (!) 140/93 (!) 118/103  Pulse: 74 (!) 39  Resp: 15 (!) 21  Temp:    SpO2: 96% 100%    General: Awake, no distress.  Obese, sitting upright, no distress CV:  Good peripheral perfusion.  Resp:  Normal effort.  Abd:  No distention.  MSK:  No deformity noted.  Asymmetric lower extremity swelling, right  greater than left.  No overlying skin changes/rash Neuro:  No focal deficits appreciated. Other:     ED Results / Procedures / Treatments   Labs (all labs ordered are listed, but only abnormal results are displayed) Labs Reviewed  BASIC METABOLIC PANEL WITH GFR - Abnormal; Notable for the following components:      Result Value   Potassium 3.3 (*)    CO2 21 (*)    Glucose, Bld 130 (*)    BUN 23 (*)    Creatinine, Ser 1.27 (*)    All other components within normal limits  CBC - Abnormal; Notable for the following components:   MCV 79.5 (*)    MCH 25.3 (*)    RDW 17.2 (*)    All other components within normal limits  BRAIN NATRIURETIC PEPTIDE - Abnormal; Notable for the following components:   B Natriuretic Peptide 1,509.6 (*)    All other components within normal limits  MAGNESIUM    EKG Sinus rhythm with a rate of 85 bpm.  Leftward axis.  Frequent PVCs.  No STEMI.  RADIOLOGY 2 view CXR interpreted me with cardiomegaly and pulmonary vascular congestion Ultrasound right leg interpreted by me without DVT  Official radiology report(s): US  Venous Img Lower Unilateral Right Result Date: 09/25/2023 CLINICAL DATA:  SWELLING, PAIN. EVAL DVT. HX CHF EXAM: RIGHT LOWER EXTREMITY VENOUS DOPPLER ULTRASOUND TECHNIQUE: Gray-scale sonography with graded compression, as well as color Doppler and duplex ultrasound were performed to evaluate the lower extremity deep venous systems from the level of the common femoral vein and including the common femoral, femoral, profunda femoral, popliteal and calf veins including the posterior tibial, peroneal and gastrocnemius veins when visible. The superficial great saphenous vein was also interrogated. Spectral Doppler was utilized to evaluate flow at rest and with distal augmentation maneuvers in the common femoral, femoral and popliteal veins. COMPARISON:  None Available. FINDINGS: Contralateral Common Femoral Vein: Respiratory phasicity is normal and  symmetric with the symptomatic side. No evidence of thrombus. Normal compressibility. Common Femoral Vein: No evidence of thrombus. Normal compressibility, respiratory phasicity and response to augmentation. Saphenofemoral Junction: No evidence of thrombus. Normal compressibility and flow on color Doppler imaging. Profunda Femoral Vein: No evidence of thrombus. Normal compressibility and flow on color Doppler imaging. Femoral Vein: No evidence of thrombus. Normal compressibility, respiratory phasicity and response to augmentation. Popliteal Vein: No evidence of thrombus. Normal compressibility, respiratory phasicity and response to augmentation. Calf Veins: No evidence of thrombus. Normal compressibility and flow on color Doppler imaging. Superficial Great Saphenous Vein: No evidence of thrombus. Normal compressibility. Other Findings: Moderate knee joint effusion in the area of patient's pain. IMPRESSION: 1. Negative for deep venous thrombosis in the right leg. 2. Moderate knee joint effusion in the area of patient's pain. Electronically Signed   By: Rogelia Myers M.D.   On: 09/25/2023 13:29   DG Chest 2 View Result Date: 09/25/2023 CLINICAL DATA:  Shortness of breath. EXAM: CHEST - 2 VIEW COMPARISON:  May 19, 2023 FINDINGS: Stable cardiomegaly. Both lungs are clear. The visualized skeletal structures are unremarkable. IMPRESSION: No active cardiopulmonary disease. Electronically Signed   By: Lynwood Landy Raddle M.D.   On: 09/25/2023 10:26    PROCEDURES and INTERVENTIONS:  .1-3 Lead EKG Interpretation  Performed by: Claudene Rover, MD Authorized by: Claudene Rover, MD     Interpretation: normal     ECG rate:  60   ECG rate assessment: normal     Rhythm: sinus rhythm     Ectopy: none     Conduction: normal     Medications  potassium chloride  SA (KLOR-CON  M) CR tablet 40 mEq (40 mEq Oral Given 09/25/23 1046)  furosemide  (LASIX ) injection 60 mg (60 mg Intravenous Given 09/25/23 1043)  morphine  (PF) 4  MG/ML injection 6 mg (6 mg Intravenous Given 09/25/23 1055)  predniSONE  (DELTASONE ) tablet 50 mg (50 mg Oral Given 09/25/23 1051)     IMPRESSION / MDM / ASSESSMENT AND PLAN / ED COURSE  I reviewed the triage vital signs and the nursing notes.  Differential diagnosis includes, but is not limited to, ACS, PTX, PNA, muscle strain/spasm, PE, dissection, anxiety, pleural effusion, septic joint, gout  {Patient presents with symptoms of an acute illness or injury that is potentially life-threatening.  Patient presents with signs of CHF exacerbation superimposed on gout flare of his right knee.  Less likely a septic joint.  No signs of DVT on ultrasound.  Renal dysfunction near baseline.  Elevated BNP.  Normal CBC.  Initiate diuresis, steroids and analgesia.  Clinical Course as of 09/25/23 1414  Mon Sep 25, 2023  1410 I consult with medicine who agrees to admit [DS]    Clinical Course User Index [DS] Claudene Rover, MD     FINAL CLINICAL IMPRESSION(S) / ED DIAGNOSES   Final diagnoses:  Acute on chronic systolic congestive heart failure (HCC)     Rx / DC Orders   ED Discharge Orders     None        Note:  This document was prepared using Dragon voice recognition software and may include unintentional dictation errors.   Claudene Rover, MD 09/25/23 1414

## 2023-09-26 ENCOUNTER — Telehealth (HOSPITAL_COMMUNITY): Payer: Self-pay | Admitting: Pharmacy Technician

## 2023-09-26 ENCOUNTER — Other Ambulatory Visit: Payer: Self-pay

## 2023-09-26 ENCOUNTER — Other Ambulatory Visit (HOSPITAL_COMMUNITY): Payer: Self-pay

## 2023-09-26 DIAGNOSIS — I5023 Acute on chronic systolic (congestive) heart failure: Secondary | ICD-10-CM

## 2023-09-26 DIAGNOSIS — M25461 Effusion, right knee: Secondary | ICD-10-CM | POA: Diagnosis not present

## 2023-09-26 DIAGNOSIS — I493 Ventricular premature depolarization: Secondary | ICD-10-CM

## 2023-09-26 DIAGNOSIS — I5022 Chronic systolic (congestive) heart failure: Secondary | ICD-10-CM

## 2023-09-26 DIAGNOSIS — F17200 Nicotine dependence, unspecified, uncomplicated: Secondary | ICD-10-CM

## 2023-09-26 LAB — CBC
HCT: 42.2 % (ref 39.0–52.0)
Hemoglobin: 13.2 g/dL (ref 13.0–17.0)
MCH: 25.1 pg — ABNORMAL LOW (ref 26.0–34.0)
MCHC: 31.3 g/dL (ref 30.0–36.0)
MCV: 80.2 fL (ref 80.0–100.0)
Platelets: 343 K/uL (ref 150–400)
RBC: 5.26 MIL/uL (ref 4.22–5.81)
RDW: 17.1 % — ABNORMAL HIGH (ref 11.5–15.5)
WBC: 12.6 K/uL — ABNORMAL HIGH (ref 4.0–10.5)
nRBC: 0 % (ref 0.0–0.2)

## 2023-09-26 LAB — BASIC METABOLIC PANEL WITH GFR
Anion gap: 10 (ref 5–15)
BUN: 26 mg/dL — ABNORMAL HIGH (ref 6–20)
CO2: 24 mmol/L (ref 22–32)
Calcium: 9 mg/dL (ref 8.9–10.3)
Chloride: 102 mmol/L (ref 98–111)
Creatinine, Ser: 1.15 mg/dL (ref 0.61–1.24)
GFR, Estimated: >60 mL/min (ref >60)
Glucose, Bld: 137 mg/dL — ABNORMAL HIGH (ref 70–99)
Potassium: 3.9 mmol/L (ref 3.5–5.1)
Sodium: 136 mmol/L (ref 135–145)

## 2023-09-26 LAB — SYNOVIAL CELL COUNT + DIFF, W/ CRYSTALS
Eosinophils-Synovial: 0 %
Lymphocytes-Synovial Fld: 0 %
Monocyte-Macrophage-Synovial Fluid: 3 %
Neutrophil, Synovial: 97 %
WBC, Synovial: 27028 /mm3 — ABNORMAL HIGH (ref 0–200)

## 2023-09-26 MED ORDER — FUROSEMIDE 10 MG/ML IJ SOLN
60.0000 mg | Freq: Once | INTRAMUSCULAR | Status: AC
  Administered 2023-09-26: 60 mg via INTRAVENOUS
  Filled 2023-09-26: qty 6

## 2023-09-26 MED ORDER — TRIAMCINOLONE ACETONIDE 40 MG/ML IJ SUSP
80.0000 mg | Freq: Once | INTRAMUSCULAR | Status: AC
  Administered 2023-09-26: 80 mg via INTRA_ARTICULAR
  Filled 2023-09-26: qty 2

## 2023-09-26 MED ORDER — TORSEMIDE 20 MG PO TABS: 40.0000 mg | ORAL_TABLET | Freq: Every day | ORAL | Status: DC

## 2023-09-26 MED ORDER — COLCHICINE 0.6 MG PO TABS: 0.6000 mg | ORAL_TABLET | Freq: Two times a day (BID) | ORAL | 0 refills | Status: DC

## 2023-09-26 MED ORDER — ORAL CARE MOUTH RINSE: 15.0000 mL | OROMUCOSAL | Status: DC | PRN

## 2023-09-26 MED ORDER — TORSEMIDE 40 MG PO TABS: 40.0000 mg | ORAL_TABLET | Freq: Every day | ORAL | 2 refills | Status: DC

## 2023-09-26 MED ORDER — BUPIVACAINE HCL (PF) 0.5 % IJ SOLN
10.0000 mL | Freq: Once | INTRAMUSCULAR | Status: AC
  Administered 2023-09-26: 10 mL via INTRA_ARTICULAR
  Filled 2023-09-26: qty 10

## 2023-09-26 MED ORDER — PREDNISONE 10 MG PO TABS: ORAL_TABLET | ORAL | 0 refills | Status: AC

## 2023-09-26 NOTE — Evaluation (Signed)
 Physical Therapy Evaluation Patient Details Name: Philip Monroe MRN: [id number] DOB: December 29, 1975 Today's Date: 09/26/2023  History of Present Illness  Pt is a 48 y.o. male with medical history significant of multivessel CAD, chronic systolic heart failure, mild pulmonary hypertension, presenting to the emergency department for evaluation of right leg pain and swelling that is been present for about 2 weeks.  Pain is worse with movement and weightbearing, no relieving factors.  He was in the emergency department on the third of this month (11 days ago) and diagnosed with gout.   Clinical Impression  Patient alert up in room speaking with chaplain. At baseline the pt stated he has been modI with his crutches for several months now (has struggled with gout in his feet). Has been able to navigate the stairs with his crutches as well. Noted for crutches to be slightly high for pt, but politely declined adjustment. He was able to ambulate ~79ft without crutches and noted for RLE antalgic gait, improved cadence and comfort with crutches. Pt kept on acute PT caseload to ensure continued improvement in RLE symptoms, as well as for outpatient PT follow up to return to independent baseline.         If plan is discharge home, recommend the following: Assist for transportation   Can travel by private vehicle        Equipment Recommendations None recommended by PT  Recommendations for Other Services       Functional Status Assessment Patient has had a recent decline in their functional status and demonstrates the ability to make significant improvements in function in a reasonable and predictable amount of time.     Precautions / Restrictions Precautions Recall of Precautions/Restrictions: Intact Restrictions Weight Bearing Restrictions Per Provider Order: No      Mobility  Bed Mobility               General bed mobility comments: pt standing in room upon PT entrance     Transfers Overall transfer level: Independent                      Ambulation/Gait Ambulation/Gait assistance: Modified independent (Device/Increase time) Gait Distance (Feet): 15 Feet Assistive device: None, Crutches         General Gait Details: 65ft without crutches, 8ft with to bathroom, no unsteadiness.  Stairs            Wheelchair Mobility     Tilt Bed    Modified Rankin (Stroke Patients Only)       Balance Overall balance assessment: Modified Independent                                           Pertinent Vitals/Pain Pain Assessment Pain Assessment: Faces Faces Pain Scale: Hurts a little bit Pain Location: RLE with weight bearing Pain Descriptors / Indicators: Grimacing Pain Intervention(s): Limited activity within patient's tolerance, Monitored during session, Premedicated before session    Home Living Family/patient expects to be discharged to:: Private residence Living Arrangements: Alone Available Help at Discharge: Family Type of Home: House Home Access: Stairs to enter   Secretary/administrator of Steps: flight            Prior Function Prior Level of Function : Independent/Modified Independent             Mobility Comments: has been using crutches for a  few months now, initially for gout pain, now for R knee pain       Extremity/Trunk Assessment   Upper Extremity Assessment Upper Extremity Assessment: Overall WFL for tasks assessed    Lower Extremity Assessment Lower Extremity Assessment:  (able to move against gravity but not official MMT)       Communication        Cognition Arousal: Alert Behavior During Therapy: WFL for tasks assessed/performed   PT - Cognitive impairments: No apparent impairments                         Following commands: Intact       Cueing       General Comments      Exercises     Assessment/Plan    PT Assessment Patient needs continued  PT services  PT Problem List Pain;Decreased range of motion;Decreased activity tolerance;Decreased balance;Decreased mobility       PT Treatment Interventions DME instruction;Balance training;Gait training;Neuromuscular re-education;Stair training;Functional mobility training;Patient/family education;Therapeutic activities;Therapeutic exercise    PT Goals (Current goals can be found in the Care Plan section)  Acute Rehab PT Goals Patient Stated Goal: to have no pain PT Goal Formulation: With patient Time For Goal Achievement: 10/10/23 Potential to Achieve Goals: Good Additional Goals Additional Goal #1: Pt will ambulate independently during of a distance >1029ft to indicate return to unlimited community ambulator.    Frequency Min 1X/week     Co-evaluation               AM-PAC PT 6 Clicks Mobility  Outcome Measure Help needed turning from your back to your side while in a flat bed without using bedrails?: None Help needed moving from lying on your back to sitting on the side of a flat bed without using bedrails?: None Help needed moving to and from a bed to a chair (including a wheelchair)?: None Help needed standing up from a chair using your arms (e.g., wheelchair or bedside chair)?: None Help needed to walk in hospital room?: None Help needed climbing 3-5 steps with a railing? : None 6 Click Score: 24    End of Session   Activity Tolerance: Patient tolerated treatment well Patient left: Other (comment) (in bathroom)   PT Visit Diagnosis: Difficulty in walking, not elsewhere classified (R26.2);Pain Pain - Right/Left: Right Pain - part of body: Knee    Time: 8956-8951 PT Time Calculation (min) (ACUTE ONLY): 5 min   Charges:   PT Evaluation $PT Eval Low Complexity: 1 Low   PT General Charges $$ ACUTE PT VISIT: 1 Visit         Doyal Shams PT, DPT 11:29 AM,09/26/23

## 2023-09-26 NOTE — Telephone Encounter (Signed)
 Pharmacy Patient Advocate Encounter   Received notification from Inpatient Request that prior authorization for Jardiance  10MG  tablets is required/requested.   Insurance verification completed.   The patient is insured through [facility] .   Per test claim: PA required; PA submitted to above mentioned insurance via CoverMyMeds Key/confirmation #/EOC [id number] Status is pending

## 2023-09-26 NOTE — Hospital Course (Addendum)
 Taken from H&P.   Philip Monroe is a 48 y.o. male with medical history significant of multivessel CAD, chronic systolic heart failure, mild pulmonary hypertension, presenting to the emergency department for evaluation of right leg pain and swelling that is been present for about 2 weeks.   11 days ago he came to ED for similar complaints and was diagnosed with gout, prescribed colchicine  and NSAID.  Patient now ran out of those medications presented to ED again.  On presentation hemodynamically stable.  Labs with mild hypokalemia at 3.3, creatinine of 1.27 which is around his baseline, magnesium 1.9, BNP 1509.  Uric acid checked on 7/3 was 10.  Knee imaging with mild degenerative disease and suprapatellar joint effusion.  No fractures or dislocation. Lower extremity venous Doppler negative for DVT, did show moderate knee joint effusion.  CXR with no abnormality.  Patient was given a dose of steroid and Lasix .  7/15: Vital stable, mild leukocytosis likely secondary to steroid yesterday, renal function improved to 1.15 Orthopedic surgery was consulted s/p arthrocentesis and steroid intra-articular injection.  Symptoms started improving this morning.  Synovial fluid consistent with gout.  Patient is being discharged with a course of steroid and colchicine .  He need to have a close follow-up with PCP.  He was also given another dose of IV Lasix  at 60 mg, home torsemide  dose was increased to 40 mg daily.  Lower extremity edema with significant improvement.  Patient was advised to follow-up with his cardiologist closely for further assistance.  Patient has an history of EF less than 20% and currently is being worked up for ICD placement by his cardiologist.  Patient will continue on current medications and need to have a close follow-up with his providers for further assistance.

## 2023-09-26 NOTE — Progress Notes (Signed)
 Heart Failure Navigator Progress Note  Assessed for Heart & Vascular TOC clinic readiness.  Patient does not meet criteria due to current Advanced Heart Failure Team patient of Jules Oar, MD.   Navigator will sign off at this time.  Celedonio Coil, RN, BSN [facility] Heart Failure Navigator Secure Chat Only

## 2023-09-26 NOTE — Discharge Summary (Signed)
 Physician Discharge Summary   [name] MRN: [id number] DOB: [birth year]  Admit date:     09/25/2023  Discharge date: 09/26/23  Discharge Physician: Amaryllis Dare   PCP: Pcp, No   Recommendations at discharge:  Please obtain CBC and BMP and follow-up Follow-up with primary care provider Follow-up with cardiology-torsemide  dose was increased due to elevated BNP and lower extremity edema. Follow-up with orthopedic surgery.  Discharge Diagnoses: Principal Problem:   Effusion of right knee Active Problems:   PVC's (premature ventricular contractions)   Morbid obesity (HCC)   Chronic systolic heart failure (HCC)   Tobacco use disorder   Hospital Course: Taken from H&P.   Philip Monroe is a 48 y.o. male with medical history significant of multivessel CAD, chronic systolic heart failure, mild pulmonary hypertension, presenting to the emergency department for evaluation of right leg pain and swelling that is been present for about 2 weeks.   11 days ago he came to ED for similar complaints and was diagnosed with gout, prescribed colchicine  and NSAID.  Patient now ran out of those medications presented to ED again.  On presentation hemodynamically stable.  Labs with mild hypokalemia at 3.3, creatinine of 1.27 which is around his baseline, magnesium 1.9, BNP 1509.  Uric acid checked on 7/3 was 10.  Knee imaging with mild degenerative disease and suprapatellar joint effusion.  No fractures or dislocation. Lower extremity venous Doppler negative for DVT, did show moderate knee joint effusion.  CXR with no abnormality.  Patient was given a dose of steroid and Lasix .  7/15: Vital stable, mild leukocytosis likely secondary to steroid yesterday, renal function improved to 1.15 Orthopedic surgery was consulted s/p arthrocentesis and steroid intra-articular injection.  Symptoms started improving this morning.  Synovial fluid consistent with gout.  Patient is being discharged with a  course of steroid and colchicine .  He need to have a close follow-up with PCP.  He was also given another dose of IV Lasix  at 60 mg, home torsemide  dose was increased to 40 mg daily.  Lower extremity edema with significant improvement.  Patient was advised to follow-up with his cardiologist closely for further assistance.  Patient has an history of EF less than 20% and currently is being worked up for ICD placement by his cardiologist.  Patient will continue on current medications and need to have a close follow-up with his providers for further assistance.  Consultants: Orthopedic surgery Procedures performed: Arthrocentesis and intra-articular steroid injection of right knee Disposition: Home Diet recommendation:  Discharge Diet Orders (From admission, onward)     Start     Ordered   09/26/23 0000  Diet - low sodium heart healthy        09/26/23 1255           Cardiac diet DISCHARGE MEDICATION: Allergies as of 09/26/2023       Reactions   Lactose Intolerance (gi) Diarrhea   Statins    myalgias        Medication List     TAKE these medications    allopurinol  100 MG tablet Commonly known as: Zyloprim  Take 1 tablet (100 mg total) by mouth daily.   aspirin  81 MG chewable tablet Chew 1 tablet (81 mg total) by mouth daily.   B-12 1000 MCG Tabs Take 1 tablet (1,000 mcg total) by mouth daily.   CertaVite/Antioxidants Tabs Take 1 tablet by mouth daily.   colchicine  0.6 MG tablet Take 1 tablet (0.6 mg total) by mouth 2 (two) times  daily.   Eliquis  5 MG Tabs tablet Generic drug: apixaban  Take 1 tablet (5 mg total) by mouth 2 (two) times daily.   empagliflozin  10 MG Tabs tablet Commonly known as: JARDIANCE  Take 1 tablet (10 mg total) by mouth daily.   ezetimibe  10 MG tablet Commonly known as: ZETIA  Take 1 tablet (10 mg total) by mouth at bedtime.   HYDROcodone -acetaminophen  5-325 MG tablet Commonly known as: NORCO/VICODIN Take 1 tablet by mouth every 4  (four) hours as needed.   losartan  25 MG tablet Commonly known as: COZAAR  Take 1 tablet (25 mg total) by mouth daily.   metoprolol  succinate 25 MG 24 hr tablet Commonly known as: TOPROL -XL Take 1 tablet (25 mg total) by mouth daily.   mexiletine 150 MG capsule Commonly known as: MEXITIL  Take 2 capsules (300 mg total) by mouth 2 (two) times daily.   naproxen  500 MG tablet Commonly known as: Naprosyn  Take 1 tablet (500 mg total) by mouth 2 (two) times daily with a meal.   predniSONE  10 MG tablet Commonly known as: DELTASONE  Take 4 tablets (40 mg total) by mouth daily with breakfast for 1 day, THEN 3 tablets (30 mg total) daily with breakfast for 1 day, THEN 2 tablets (20 mg total) daily with breakfast for 1 day, THEN 1 tablet (10 mg total) daily with breakfast for 2 days. Start taking on: September 27, 2023   spironolactone  25 MG tablet Commonly known as: ALDACTONE  Take 1 tablet (25 mg total) by mouth daily.   Torsemide  40 MG Tabs Take 40 mg by mouth daily. Start taking on: September 27, 2023 What changed:  medication strength how much to take        Follow-up Information     Poggi, Norleen PARAS, MD Follow up in 2 week(s).   Specialty: Orthopedic Surgery Contact information: [address] [facility] [place] [address] [phone]         Bensimhon, Toribio SAUNDERS, MD. Schedule an appointment as soon as possible for a visit in 1 week(s).   Specialty: Cardiology Contact information: [address] [place] [address] [phone]                Discharge Exam: Philip Monroe   09/25/23 0955 09/26/23 0500  Weight: (!) 174.6 kg (!) 155.9 kg   General.  Morbidly obese gentleman, in no acute distress. Pulmonary.  Lungs clear bilaterally, normal respiratory effort. CV.  Regular rate and rhythm, no JVD, rub or murmur. Abdomen.  Soft, nontender, nondistended, BS positive. CNS.  Alert and oriented .  No focal neurologic  deficit. Extremities.  1+ LE edema bilaterally, right knee mild edema with no erythema or hyperthermia Psychiatry.  Judgment and insight appears normal.   Condition at discharge: stable  The results of significant diagnostics from this hospitalization (including imaging, microbiology, ancillary and laboratory) are listed below for reference.   Imaging Studies: DG Knee Complete 4 Views Right Result Date: 09/25/2023 CLINICAL DATA:  Right lower extremity pain and swelling for 1 week. No reported injury. EXAM: RIGHT KNEE - COMPLETE 4+ VIEW COMPARISON:  None Available. FINDINGS: Possible small suprapatellar joint effusion. No definite fracture or dislocation. Mild narrowing and osteophyte formation is noted laterally. IMPRESSION: Mild degenerative joint disease is noted laterally. Small suprapatellar joint effusion. No fracture or dislocation. Electronically Signed   By: Lynwood Landy Raddle M.D.   On: 09/25/2023 16:30   US  Venous Img Lower Unilateral Right Result Date: 09/25/2023 CLINICAL DATA:  SWELLING,  PAIN. EVAL DVT. HX CHF EXAM: RIGHT LOWER EXTREMITY VENOUS DOPPLER ULTRASOUND TECHNIQUE: Gray-scale sonography with graded compression, as well as color Doppler and duplex ultrasound were performed to evaluate the lower extremity deep venous systems from the level of the common femoral vein and including the common femoral, femoral, profunda femoral, popliteal and calf veins including the posterior tibial, peroneal and gastrocnemius veins when visible. The superficial great saphenous vein was also interrogated. Spectral Doppler was utilized to evaluate flow at rest and with distal augmentation maneuvers in the common femoral, femoral and popliteal veins. COMPARISON:  None Available. FINDINGS: Contralateral Common Femoral Vein: Respiratory phasicity is normal and symmetric with the symptomatic side. No evidence of thrombus. Normal compressibility. Common Femoral Vein: No evidence of thrombus. Normal  compressibility, respiratory phasicity and response to augmentation. Saphenofemoral Junction: No evidence of thrombus. Normal compressibility and flow on color Doppler imaging. Profunda Femoral Vein: No evidence of thrombus. Normal compressibility and flow on color Doppler imaging. Femoral Vein: No evidence of thrombus. Normal compressibility, respiratory phasicity and response to augmentation. Popliteal Vein: No evidence of thrombus. Normal compressibility, respiratory phasicity and response to augmentation. Calf Veins: No evidence of thrombus. Normal compressibility and flow on color Doppler imaging. Superficial Great Saphenous Vein: No evidence of thrombus. Normal compressibility. Other Findings: Moderate knee joint effusion in the area of patient's pain. IMPRESSION: 1. Negative for deep venous thrombosis in the right leg. 2. Moderate knee joint effusion in the area of patient's pain. Electronically Signed   By: Rogelia Myers M.D.   On: 09/25/2023 13:29   DG Chest 2 View Result Date: 09/25/2023 CLINICAL DATA:  Shortness of breath. EXAM: CHEST - 2 VIEW COMPARISON:  May 19, 2023 FINDINGS: Stable cardiomegaly. Both lungs are clear. The visualized skeletal structures are unremarkable. IMPRESSION: No active cardiopulmonary disease. Electronically Signed   By: Lynwood Landy Raddle M.D.   On: 09/25/2023 10:26    Microbiology: No results found for this or any previous visit.  Labs: CBC: Recent Labs  Lab 09/25/23 0958 09/26/23 0337  WBC 10.4 12.6*  HGB 13.6 13.2  HCT 42.7 42.2  MCV 79.5* 80.2  PLT 363 343   Basic Metabolic Panel: Recent Labs  Lab 09/25/23 0958 09/26/23 0337  NA 137 136  K 3.3* 3.9  CL 102 102  CO2 21* 24  GLUCOSE 130* 137*  BUN 23* 26*  CREATININE 1.27* 1.15  CALCIUM 9.2 9.0  MG 1.9  --    Liver Function Tests: No results for input(s): AST, ALT, ALKPHOS, BILITOT, PROT, ALBUMIN in the last 168 hours. CBG: No results for input(s): GLUCAP in the last 168  hours.  Discharge time spent: greater than 30 minutes.  This record has been created using Conservation officer, historic buildings. Errors have been sought and corrected,but may not always be located. Such creation errors do not reflect on the standard of care.   Signed: Amaryllis Dare, MD [facility] 09/26/2023

## 2023-09-26 NOTE — Consult Note (Signed)
 ORTHOPAEDIC CONSULTATION  REQUESTING PHYSICIAN: Caleen Qualia, MD  Chief Complaint:   Right knee pain and swelling.  History of Present Illness: Philip Monroe is a 48 y.o. male with multiple medical problems including coronary artery disease, congestive heart failure, chronic kidney disease, hypertension, hyperlipidemia, gout, and morbid obesity who normally lives independently.  The patient began to notice increased pain and swelling in his right knee several weeks ago.  He presented to the emergency room where he was diagnosed with gout and started on colchicine , naproxen , and Percocet.  Initially, he felt that his symptoms were improving, but then they again worsened, prompting him to return to the emergency room yesterday where he subsequently was admitted for further work-up and management of his symptoms.  In addition, orthopedic consultation was requested.  The patient denies any specific injury to the knee.  Normally he ambulates without any assistive devices, but has been noticing increased pain with weightbearing, prompting him to use crutches.  He also denies any fevers or chills, no urgency no any numbness or paresthesias down his leg to his foot.  Past Medical History:  Diagnosis Date   Aortic root dilatation (HCC)    a. 07/2022 Echo: Ao root 4.4cm, Asc Ao 4.0cm.   CAD (coronary artery disease)    a. 08/2017 Cath: LM nl, LAD 15m/d, D1 70ost, LCX 165m, OM1 90 inf branch, 60 sup branch, OM2 small, RCA 30 diffuse, RPDA 100 w/ L->R collats; b. 07/2022 Echo: LM nl, LAD 10m, D1 80, LCX 137m, OM1 80, RCA 62m, RPDA 100-->No targets for PCI/CABG-->Med rx.   CHF (congestive heart failure) (HCC)    takes lasix    Chronic HFrEF (heart failure with reduced ejection fraction) (HCC)    a. 03/2015 Echo: EF 35-40%; b. 08/2017 Echo: EF 15%; c. 08/2017 cMRI: EF 35%; d. 06/2018 Echo: EF 10-15%; e. 07/2022 Echo: EF 15-20%, grIII DD, mild MR, sev dil  LA, mod-sev PH, Ao root 4.4cm, Asc Ao 4.0cm; f. 07/2022 RHC: RA 4, RV 38/3, PA 43/18 (26), PCWP 11. CO/CI 5.3/1.8.   CKD (chronic kidney disease), stage II    Gout    Hyperlipidemia LDL goal <70    Hypertension    Ischemic cardiomyopathy    a. 03/2015 Echo: EF 35-40%; b. 08/2017 Echo: EF 15%; c. 08/2017 cMRI: EF 35%; d. 06/2018 Echo: EF 10-15%; e. 07/2022 Echo: EF 15-20%, grIII DD.   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 09/2022 Event monitor: predominantly sinus rhythm, avg HR 81 (46-167). 17 runs NSVT (fastest/longest 13 beats x 167). Rare PACs, 17.5% PVC burden.   PVC's (premature ventricular contractions)    a. 09/2022 Event Monitor: 17.5% PVC burden.   Statin intolerance    Tobacco abuse    History reviewed. No pertinent surgical history. Social History   Socioeconomic History   Marital status: Single    Spouse name: Not on file   Number of children: 4   Years of education: Not on file   Highest education level: 10th grade  Occupational History   Not on file  Tobacco Use   Smoking status: Some Days    Current packs/day: 0.00    Types: Cigarettes    Last attempt to quit: 03/20/2015    Years since quitting: 8.5   Smokeless tobacco: Never  Vaping Use   Vaping status: Never Used  Substance and Sexual Activity  Alcohol use: Yes    Alcohol/week: 36.0 standard drinks of alcohol    Types: 36 Cans of beer per week    Comment: 18 cans of beer on Friday and again on Saturday nights   Drug use: No   Sexual activity: Yes    Birth control/protection: Coitus interruptus, Condom  Other Topics Concern   Not on file  Social History Narrative   Not on file   Social Drivers of Health   Financial Resource Strain: Medium Risk (05/22/2023)   Overall Financial Resource Strain (CARDIA)    Difficulty of Paying Living Expenses: Somewhat hard  Food Insecurity: No Food  Insecurity (05/20/2023)   Hunger Vital Sign    Worried About Running Out of Food in the Last Year: Never true    Ran Out of Food in the Last Year: Never true  Transportation Needs: No Transportation Needs (05/22/2023)   PRAPARE - Administrator, Civil Service (Medical): No    Lack of Transportation (Non-Medical): No  Physical Activity: Not on file  Stress: Not on file  Social Connections: Not on file   History reviewed. No pertinent family history. Allergies  Allergen Reactions   Lactose Intolerance (Gi) Diarrhea   Statins     myalgias   Prior to Admission medications   Medication Sig Start Date End Date Taking? Authorizing Provider  allopurinol  (ZYLOPRIM ) 100 MG tablet Take 1 tablet (100 mg total) by mouth daily. 09/14/23 12/13/23 Yes Viviann Pastor, MD  aspirin  81 MG chewable tablet Chew 1 tablet (81 mg total) by mouth daily. 05/24/23  Yes Caleen Qualia, MD  cyanocobalamin  (VITAMIN B12) 1000 MCG tablet Take 1 tablet (1,000 mcg total) by mouth daily. 05/24/23  Yes Caleen Qualia, MD  ELIQUIS  5 MG TABS tablet Take 1 tablet (5 mg total) by mouth 2 (two) times daily. 09/14/23 12/13/23 Yes Viviann Pastor, MD  empagliflozin  (JARDIANCE ) 10 MG TABS tablet Take 1 tablet (10 mg total) by mouth daily. 09/14/23 12/13/23 Yes Viviann Pastor, MD  ezetimibe  (ZETIA ) 10 MG tablet Take 1 tablet (10 mg total) by mouth at bedtime. 09/14/23 12/13/23 Yes Viviann Pastor, MD  HYDROcodone -acetaminophen  (NORCO/VICODIN) 5-325 MG tablet Take 1 tablet by mouth every 4 (four) hours as needed. 06/21/23  Yes Harrison, Myah A, PA-C  losartan  (COZAAR ) 25 MG tablet Take 1 tablet (25 mg total) by mouth daily. 06/22/23  Yes [place] A, FNP  metoprolol  succinate (TOPROL -XL) 25 MG 24 hr tablet Take 1 tablet (25 mg total) by mouth daily. 07/04/23  Yes Bensimhon, Toribio SAUNDERS, MD  mexiletine (MEXITIL ) 150 MG capsule Take 2 capsules (300 mg total) by mouth 2 (two) times daily. 08/08/23  Yes Bensimhon, Daniel R, MD  Multiple  Vitamin (MULTIVITAMIN WITH MINERALS) TABS tablet Take 1 tablet by mouth daily. 05/24/23  Yes Caleen Qualia, MD  naproxen  (NAPROSYN ) 500 MG tablet Take 1 tablet (500 mg total) by mouth 2 (two) times daily with a meal. 09/14/23  Yes Viviann Pastor, MD  spironolactone  (ALDACTONE ) 25 MG tablet Take 1 tablet (25 mg total) by mouth daily. 09/14/23 12/13/23 Yes Viviann Pastor, MD  torsemide  (DEMADEX ) 20 MG tablet Take 1 tablet (20 mg total) by mouth daily. 09/14/23 12/13/23 Yes Viviann Pastor, MD   DG Knee Complete 4 Views Right Result Date: 09/25/2023 CLINICAL DATA:  Right lower extremity pain and swelling for 1 week. No reported injury. EXAM: RIGHT KNEE - COMPLETE 4+ VIEW COMPARISON:  None Available. FINDINGS: Possible small suprapatellar joint effusion. No definite fracture or dislocation. Mild  narrowing and osteophyte formation is noted laterally. IMPRESSION: Mild degenerative joint disease is noted laterally. Small suprapatellar joint effusion. No fracture or dislocation. Electronically Signed   By: Lynwood Landy Raddle M.D.   On: 09/25/2023 16:30   US  Venous Img Lower Unilateral Right Result Date: 09/25/2023 CLINICAL DATA:  SWELLING, PAIN. EVAL DVT. HX CHF EXAM: RIGHT LOWER EXTREMITY VENOUS DOPPLER ULTRASOUND TECHNIQUE: Gray-scale sonography with graded compression, as well as color Doppler and duplex ultrasound were performed to evaluate the lower extremity deep venous systems from the level of the common femoral vein and including the common femoral, femoral, profunda femoral, popliteal and calf veins including the posterior tibial, peroneal and gastrocnemius veins when visible. The superficial great saphenous vein was also interrogated. Spectral Doppler was utilized to evaluate flow at rest and with distal augmentation maneuvers in the common femoral, femoral and popliteal veins. COMPARISON:  None Available. FINDINGS: Contralateral Common Femoral Vein: Respiratory phasicity is normal and symmetric with the  symptomatic side. No evidence of thrombus. Normal compressibility. Common Femoral Vein: No evidence of thrombus. Normal compressibility, respiratory phasicity and response to augmentation. Saphenofemoral Junction: No evidence of thrombus. Normal compressibility and flow on color Doppler imaging. Profunda Femoral Vein: No evidence of thrombus. Normal compressibility and flow on color Doppler imaging. Femoral Vein: No evidence of thrombus. Normal compressibility, respiratory phasicity and response to augmentation. Popliteal Vein: No evidence of thrombus. Normal compressibility, respiratory phasicity and response to augmentation. Calf Veins: No evidence of thrombus. Normal compressibility and flow on color Doppler imaging. Superficial Great Saphenous Vein: No evidence of thrombus. Normal compressibility. Other Findings: Moderate knee joint effusion in the area of patient's pain. IMPRESSION: 1. Negative for deep venous thrombosis in the right leg. 2. Moderate knee joint effusion in the area of patient's pain. Electronically Signed   By: Rogelia Myers M.D.   On: 09/25/2023 13:29   DG Chest 2 View Result Date: 09/25/2023 CLINICAL DATA:  Shortness of breath. EXAM: CHEST - 2 VIEW COMPARISON:  May 19, 2023 FINDINGS: Stable cardiomegaly. Both lungs are clear. The visualized skeletal structures are unremarkable. IMPRESSION: No active cardiopulmonary disease. Electronically Signed   By: Lynwood Landy Raddle M.D.   On: 09/25/2023 10:26    Positive ROS: All other systems have been reviewed and were otherwise negative with the exception of those mentioned in the HPI and as above.  Physical Exam: General:  Alert, no acute distress Psychiatric:  Patient is competent for consent with normal mood and affect   Cardiovascular:  No pedal edema Respiratory:  No wheezing, non-labored breathing GI:  Abdomen is soft and non-tender Skin:  No lesions in the area of chief complaint Neurologic:  Sensation intact distally Lymphatic:   No axillary or cervical lymphadenopathy  Orthopedic Exam:  Orthopedic examination is limited to the right knee and lower extremity.  Skin inspection around the right knee is notable for mild swelling, but otherwise is unremarkable.  No erythema, ecchymosis, abrasions, or other skin abnormalities are identified.  He has a small effusion.  He has mild tenderness diffusely along the medial and lateral aspects of the knee.  Actively, he is able to range his knee from approximately 20 degrees to 90 degrees.  Passively, he can be extended to near 0 degrees and flexed to 100 degrees.  He notes moderate pain with knee range of motion.  His patella tracks well and is without crepitus.  No ligamentous laxity is noted.  He is grossly neurovascularly intact to the right lower extremity and foot.  X-rays:  Recent AP, lateral, and oblique views of the right knee are available for review and have been reviewed by myself.  The findings are as described above.  Assessment: Acute right knee pain and effusion secondary to presumed gouty flare with mild underlying degenerative joint disease.  Plan: The treatment options have been discussed with the patient, including a right knee aspiration with steroid injection.  The patient would like to proceed with this option.  Therefore, after obtaining verbal consent, the right knee is aspirated sterilely of approximately 10 cc of dark straw-colored fluid before the knee is injected using a solution of 8 cc of 0.5% Sensorcaine  and 2 cc of Kenalog  40 (80 mg).  The patient tolerated the procedure well.    The patient may be mobilized with physical therapy, weightbearing as tolerated on the right leg, and using crutches or a walker as necessary for balance and support.  He may receive medication for pain as deemed appropriate medically, as well as to continue oral management of his underlying gout, especially given the recent uric acid level of 10.0.  Thank you for asking me to  participate in the care of this pleasant yet unfortunate man.  I we will sign off at this time.  Please make arrangements for him to follow-up with one of our PAs in our office in 3 to 4 weeks as needed.  If you have further need of orthopedic input during this hospitalization, please reconsult me.   DOROTHA Reyes Maltos, MD  Beeper #:  [phone]  09/26/2023 12:39 PM

## 2023-09-26 NOTE — Progress Notes (Signed)
 Heart Failure Stewardship Pharmacy Note  PCP: Pcp, No PCP-Cardiologist: None  HPI: Philip Monroe is a 48 y.o. male with multivessel CAD, chronic systolic heart failure, mild pulmonary hypertension, alcohol abuse who presented with worsening leg pain, swelling, orthopnea, and dyspnea on exertion. Patient has been nonadherent to medications. On admission, BNP was 1509.6, HS-troponin was not measured, and body fluid culture pending. Chest x-ray noted no active disease. US  negative for DVT.    Pertinent cardiac history: Previously followed by [facility] cardiology for ICM/noncompaction CM. RHC 07/2022 with normal LV and RV filling pressures, mild pulmonary hypertension with mild elevation of PVL, low cardiac index 1.8. LHC 07/2022 with severe multivessel CAD including 80% mid RCA, 100% RPDA, 100% mid LCx, 80% OM1, 80% mid-distal LAD, medically managed. Admitted 05/19/23 with progressive HF symptoms after being noncompliant with meds. Echo EF <20%, LV with GHK, LV severely dilated, RV mod reduced, LA severely dilated, mild-mod MR. D/c weight 360 pounds.  Pertinent Lab Values: Creatinine, Ser  Date Value Ref Range Status  09/26/2023 1.15 0.61 - 1.24 mg/dL Final   BUN  Date Value Ref Range Status  09/26/2023 26 (H) 6 - 20 mg/dL Final   Potassium  Date Value Ref Range Status  09/26/2023 3.9 3.5 - 5.1 mmol/L Final   Sodium  Date Value Ref Range Status  09/26/2023 136 135 - 145 mmol/L Final   B Natriuretic Peptide  Date Value Ref Range Status  09/25/2023 1,509.6 (H) 0.0 - 100.0 pg/mL Final    Comment:    Performed at [facility], [address] [place], [place] [address]   Magnesium  Date Value Ref Range Status  09/25/2023 1.9 1.7 - 2.4 mg/dL Final    Comment:    Performed at [facility], [address] [place], [place] [address]   TSH  Date Value Ref Range Status  05/19/2023 1.160 0.350 - 4.500 uIU/mL Final    Comment:    Performed by a 3rd Generation assay with a  functional sensitivity of <=0.01 uIU/mL. Performed at [facility], [address] [place], [place] [address]     Vital Signs: Temp:  [97.5 F (36.4 C)-99.9 F (37.7 C)] 97.5 F (36.4 C) (07/15 0344) Pulse Rate:  [34-86] 74 (07/15 0344) Cardiac Rhythm: Heart block (07/14 2000) Resp:  [15-22] 16 (07/15 0500) BP: (112-145)/(70-129) 112/88 (07/15 0344) SpO2:  [92 %-100 %] 94 % (07/15 0344) Weight:  [155.9 kg (343 lb 11.2 oz)-174.6 kg (385 lb)] 155.9 kg (343 lb 11.2 oz) (07/15 0500)  Intake/Output Summary (Last 24 hours) at 09/26/2023 0748 Last data filed at 09/26/2023 0500 Gross per 24 hour  Intake 980 ml  Output 2070 ml  Net -1090 ml    Current Heart Failure Medications:  Loop diuretic: torsemide  20 mg daily Beta-Blocker: metoprolol  succinate 25 mg daily ACEI/ARB/ARNI: losartan  25 mg daily MRA: spironolactone  25 mg daily SGLT2i: Jardiance  10 mg daily Other: mexiletine  Prior to admission Heart Failure Medications:  Loop diuretic: torsemide  20 mg daily (not taking) Beta-Blocker: metoprolol  succinate 12.5 mg daily ACEI/ARB/ARNI: losartan  25 mg daily (not taking) MRA: spironolactone  25 mg daily (not taking) SGLT2i: Jardiance  10 mg daily  Other: mexiletine 200 mg BID (not taking)  Assessment: 1. Acute on chronic systolic heart failure (LVEF <20%) with moderately reduced RV function, due to NICM. NYHA class III symptoms.  -Symptoms: Reports shortness of breath is improving. Persistent orthopnea. No left LEE today and improving right LEE. Appetite is fair. -Volume: Mildly hypervolemic on exam,  Creatinine is improved form yesterday. Torsemide  increased today to 40 mg daily. -Hemodynamics: BP elevated on admission, now WNL. HR 70-100s. -BB: Continue metoprolol  succinate 25 mg daily. Appears to be tolerating fine with no low output symptoms at this time. -ACEI/ARB/ARNI: Continue losartan  25 mg daily. Patient assistance for Entresto  was declined, however, patient reports  having new insurance. -MRA: Continue spironolactone  25 mg daily -SGLT2i: Continue Jardiance  10 mg daily. Patient received via patient assistance.  Plan: 1) Medication changes recommended at this time: -None  2) Patient assistance: -Receives Jardiance  via patient assistance -Receives generic medications via medication management at [facility]  3) Education: - Patient has been educated on current HF medications and potential additions to HF medication regimen - Patient verbalizes understanding that over the next few months, these medication doses may change and more medications may be added to optimize HF regimen - Patient has been educated on basic disease state pathophysiology and goals of therapy  Medication Assistance / Insurance Benefits Check: Does the patient have prescription insurance?    Type of insurance plan:  Does the patient qualify for medication assistance through manufacturers or grants? Yes  Eligible grants and/or patient assistance programs: Eliquis  and Jardiance   Medication assistance applications in progress: none  Medication assistance applications approved: Eliquis  and Jardiance  Approved medication assistance renewals will be completed by: Herchel Ferries  Outpatient Pharmacy: Prior to admission outpatient pharmacy: [facility]      Please do not hesitate to reach out with questions or concerns,  Jaun Bash, PharmD, CPP, BCPS, [facility] Heart Failure Pharmacist  Phone - [phone] 09/26/2023 11:11 AM

## 2023-09-26 NOTE — Discharge Instructions (Signed)
 Follow up with PCP and pick up medications from pharmacy. Make sure to take all antibiotics. Do not double up on narcotic/pain medication or drink alcohol during this time. Do not drive while taking narcotic medication. Call 911 or return to ER for life threatening issues or other concerns.

## 2023-09-26 NOTE — Progress Notes (Signed)
   09/26/23 1300  Spiritual Encounters  Type of Visit Initial  Care provided to: Patient  Reason for visit Advance directives  OnCall Visit Yes  Interventions  Spiritual Care Interventions Made Established relationship of care and support  Intervention Outcomes  Outcomes Connection to spiritual care   Chaplain shared Advance Directive package with the patient. Patient took the information and the Chaplain said they would follow-up on AD later.

## 2023-09-26 NOTE — Telephone Encounter (Signed)
 Patient Product/process development scientist completed.    The patient is insured through [employer].     Ran test claim for Entresto 24-26 mg and the current 30 day co-pay is $4.00.  Ran test claim for Farxiga 10 mg and Requires Prior Authorization.  Ran test claim for Jardiance 10 mg and Requires Prior Authorization.  This test claim was processed through [facility]- copay amounts may vary at other pharmacies due to pharmacy/plan contracts, or as the patient moves through the different stages of their insurance plan.     Roland Earl, CPHT Pharmacy Technician III Certified Patient Advocate [facility] Patient Advocate Team Direct Number: [phone]  Fax: [phone]

## 2023-09-26 NOTE — Telephone Encounter (Signed)
 Pharmacy Patient Advocate Encounter  Received notification from [facility] that Prior Authorization for Jardiance  10MG  tablets  has been APPROVED from 09/26/2023 to 09/25/2024. Ran test claim, Copay is $4.00. This test claim was processed through [facility]- copay amounts may vary at other pharmacies due to pharmacy/plan contracts, or as the patient moves through the different stages of their insurance plan.   PA #/Case ID/Reference #: [id number]

## 2023-09-26 NOTE — Plan of Care (Addendum)
 This patient remains on AR-2A as of time of writing. The patient is AA+Ox4. No supplemental O2 requirement at this time. The patient is on telemetry monitoring. Per the patient, his chief concern overnight is the RLE pain for which he is receiving scheduled medications. Consult placed to Heart Failure Navigation.   Problem: Education: Goal: Knowledge of General Education information will improve Description: Including pain rating scale, medication(s)/side effects and non-pharmacologic comfort measures Outcome: Progressing   Problem: Health Behavior/Discharge Planning: Goal: Ability to manage health-related needs will improve Outcome: Progressing   Problem: Clinical Measurements: Goal: Ability to maintain clinical measurements within normal limits will improve Outcome: Progressing Goal: Will remain free from infection Outcome: Progressing Goal: Diagnostic test results will improve Outcome: Progressing Goal: Respiratory complications will improve Outcome: Progressing Goal: Cardiovascular complication will be avoided Outcome: Progressing   Problem: Activity: Goal: Risk for activity intolerance will decrease Outcome: Progressing   Problem: Nutrition: Goal: Adequate nutrition will be maintained Outcome: Progressing   Problem: Coping: Goal: Level of anxiety will decrease Outcome: Progressing   Problem: Elimination: Goal: Will not experience complications related to bowel motility Outcome: Progressing Goal: Will not experience complications related to urinary retention Outcome: Progressing   Problem: Pain Managment: Goal: General experience of comfort will improve and/or be controlled Outcome: Progressing   Problem: Safety: Goal: Ability to remain free from injury will improve Outcome: Progressing   Problem: Skin Integrity: Goal: Risk for impaired skin integrity will decrease Outcome: Progressing

## 2023-09-26 NOTE — Progress Notes (Signed)
 Nsg Discharge Note  Admit Date:  09/25/2023 Discharge date: 09/26/2023   Philip Monroe to be D/C'd Home per MD order.  AVS completed.  Copy for chart, and copy for patient signed, and dated. Patient/caregiver able to verbalize understanding.  IV catheter discontinued intact. Site without signs and symptoms of complications - no redness or edema noted at insertion site, patient denies c/o pain - only slight tenderness at site.  Dressing with slight pressure applied.  D/c Instructions-Education: Discharge instructions given to patient/family with verbalized understanding. D/c education completed with patient/family including follow up instructions, medication list, d/c activities limitations if indicated, with other d/c instructions as indicated by MD - patient able to verbalize understanding, all questions fully answered. Patient instructed to return to ED, call 911, or call MD for any changes in condition.  Patient escorted via WC, to DC lounge to wait for his ride and D/C home via private auto.

## 2023-09-29 LAB — BODY FLUID CULTURE W GRAM STAIN: Culture: NO GROWTH

## 2023-11-24 ENCOUNTER — Inpatient Hospital Stay
Admission: EM | Admit: 2023-11-24 | Discharge: 2023-12-04 | DRG: 286 | Disposition: A | Discharge status: Home / Self Care | Attending: Internal Medicine | Admitting: Internal Medicine

## 2023-11-24 ENCOUNTER — Emergency Department

## 2023-11-24 ENCOUNTER — Other Ambulatory Visit: Payer: Self-pay

## 2023-11-24 ENCOUNTER — Encounter: Payer: Self-pay | Admitting: Intensive Care

## 2023-11-24 DIAGNOSIS — I424 Endocardial fibroelastosis: Secondary | ICD-10-CM | POA: Diagnosis present

## 2023-11-24 DIAGNOSIS — I2510 Atherosclerotic heart disease of native coronary artery without angina pectoris: Secondary | ICD-10-CM | POA: Diagnosis present

## 2023-11-24 DIAGNOSIS — N182 Chronic kidney disease, stage 2 (mild): Secondary | ICD-10-CM | POA: Diagnosis present

## 2023-11-24 DIAGNOSIS — Z91011 Allergy to milk products: Secondary | ICD-10-CM

## 2023-11-24 DIAGNOSIS — I513 Intracardiac thrombosis, not elsewhere classified: Secondary | ICD-10-CM | POA: Diagnosis not present

## 2023-11-24 DIAGNOSIS — I471 Supraventricular tachycardia, unspecified: Secondary | ICD-10-CM | POA: Diagnosis present

## 2023-11-24 DIAGNOSIS — I272 Pulmonary hypertension, unspecified: Secondary | ICD-10-CM | POA: Diagnosis present

## 2023-11-24 DIAGNOSIS — F17200 Nicotine dependence, unspecified, uncomplicated: Secondary | ICD-10-CM | POA: Diagnosis not present

## 2023-11-24 DIAGNOSIS — I493 Ventricular premature depolarization: Secondary | ICD-10-CM | POA: Diagnosis present

## 2023-11-24 DIAGNOSIS — I2489 Other forms of acute ischemic heart disease: Secondary | ICD-10-CM | POA: Diagnosis present

## 2023-11-24 DIAGNOSIS — I5023 Acute on chronic systolic (congestive) heart failure: Secondary | ICD-10-CM | POA: Diagnosis not present

## 2023-11-24 DIAGNOSIS — E66812 Obesity, class 2: Secondary | ICD-10-CM | POA: Diagnosis present

## 2023-11-24 DIAGNOSIS — Z888 Allergy status to other drugs, medicaments and biological substances status: Secondary | ICD-10-CM

## 2023-11-24 DIAGNOSIS — E785 Hyperlipidemia, unspecified: Secondary | ICD-10-CM | POA: Diagnosis present

## 2023-11-24 DIAGNOSIS — Z951 Presence of aortocoronary bypass graft: Secondary | ICD-10-CM | POA: Diagnosis not present

## 2023-11-24 DIAGNOSIS — I77810 Thoracic aortic ectasia: Secondary | ICD-10-CM | POA: Diagnosis present

## 2023-11-24 DIAGNOSIS — I10 Essential (primary) hypertension: Secondary | ICD-10-CM | POA: Diagnosis present

## 2023-11-24 DIAGNOSIS — Z7901 Long term (current) use of anticoagulants: Secondary | ICD-10-CM

## 2023-11-24 DIAGNOSIS — F17210 Nicotine dependence, cigarettes, uncomplicated: Secondary | ICD-10-CM | POA: Diagnosis present

## 2023-11-24 DIAGNOSIS — I428 Other cardiomyopathies: Secondary | ICD-10-CM | POA: Diagnosis not present

## 2023-11-24 DIAGNOSIS — M109 Gout, unspecified: Secondary | ICD-10-CM | POA: Diagnosis present

## 2023-11-24 DIAGNOSIS — Z555 Less than a high school diploma: Secondary | ICD-10-CM

## 2023-11-24 DIAGNOSIS — I5021 Acute systolic (congestive) heart failure: Secondary | ICD-10-CM | POA: Diagnosis not present

## 2023-11-24 DIAGNOSIS — I5043 Acute on chronic combined systolic (congestive) and diastolic (congestive) heart failure: Secondary | ICD-10-CM | POA: Diagnosis present

## 2023-11-24 DIAGNOSIS — E871 Hypo-osmolality and hyponatremia: Secondary | ICD-10-CM | POA: Diagnosis present

## 2023-11-24 DIAGNOSIS — I130 Hypertensive heart and chronic kidney disease with heart failure and stage 1 through stage 4 chronic kidney disease, or unspecified chronic kidney disease: Principal | ICD-10-CM | POA: Diagnosis present

## 2023-11-24 DIAGNOSIS — T501X6A Underdosing of loop [high-ceiling] diuretics, initial encounter: Secondary | ICD-10-CM | POA: Diagnosis present

## 2023-11-24 DIAGNOSIS — R739 Hyperglycemia, unspecified: Secondary | ICD-10-CM | POA: Diagnosis present

## 2023-11-24 DIAGNOSIS — Z7982 Long term (current) use of aspirin: Secondary | ICD-10-CM

## 2023-11-24 DIAGNOSIS — M25561 Pain in right knee: Secondary | ICD-10-CM | POA: Diagnosis present

## 2023-11-24 DIAGNOSIS — Z91148 Patient's other noncompliance with medication regimen for other reason: Secondary | ICD-10-CM

## 2023-11-24 DIAGNOSIS — Z7984 Long term (current) use of oral hypoglycemic drugs: Secondary | ICD-10-CM | POA: Diagnosis not present

## 2023-11-24 DIAGNOSIS — I255 Ischemic cardiomyopathy: Secondary | ICD-10-CM | POA: Diagnosis present

## 2023-11-24 DIAGNOSIS — Z5986 Financial insecurity: Secondary | ICD-10-CM

## 2023-11-24 DIAGNOSIS — Z6837 Body mass index (BMI) 37.0-37.9, adult: Secondary | ICD-10-CM

## 2023-11-24 DIAGNOSIS — N189 Chronic kidney disease, unspecified: Secondary | ICD-10-CM | POA: Diagnosis not present

## 2023-11-24 DIAGNOSIS — I5084 End stage heart failure: Secondary | ICD-10-CM | POA: Diagnosis present

## 2023-11-24 DIAGNOSIS — Z5982 Transportation insecurity: Secondary | ICD-10-CM

## 2023-11-24 DIAGNOSIS — I5022 Chronic systolic (congestive) heart failure: Secondary | ICD-10-CM | POA: Diagnosis not present

## 2023-11-24 DIAGNOSIS — Z79899 Other long term (current) drug therapy: Secondary | ICD-10-CM

## 2023-11-24 DIAGNOSIS — I509 Heart failure, unspecified: Principal | ICD-10-CM

## 2023-11-24 LAB — BASIC METABOLIC PANEL WITH GFR
Anion gap: 10 (ref 5–15)
BUN: 22 mg/dL — ABNORMAL HIGH (ref 6–20)
CO2: 22 mmol/L (ref 22–32)
Calcium: 8.6 mg/dL — ABNORMAL LOW (ref 8.9–10.3)
Chloride: 99 mmol/L (ref 98–111)
Creatinine, Ser: 1.28 mg/dL — ABNORMAL HIGH (ref 0.61–1.24)
GFR, Estimated: >60 mL/min (ref >60)
Glucose, Bld: 114 mg/dL — ABNORMAL HIGH (ref 70–99)
Potassium: 3.9 mmol/L (ref 3.5–5.1)
Sodium: 131 mmol/L — ABNORMAL LOW (ref 135–145)

## 2023-11-24 LAB — HEPATIC FUNCTION PANEL
ALT: 20 U/L (ref 0–44)
AST: 23 U/L (ref 15–41)
Albumin: 3.1 g/dL — ABNORMAL LOW (ref 3.5–5.0)
Alkaline Phosphatase: 64 U/L (ref 38–126)
Bilirubin, Direct: 0.6 mg/dL — ABNORMAL HIGH (ref 0.0–0.2)
Indirect Bilirubin: 1.2 mg/dL — ABNORMAL HIGH (ref 0.3–0.9)
Total Bilirubin: 1.8 mg/dL — ABNORMAL HIGH (ref 0.0–1.2)
Total Protein: 6.9 g/dL (ref 6.5–8.1)

## 2023-11-24 LAB — CBC
HCT: 40.3 % (ref 39.0–52.0)
Hemoglobin: 12.8 g/dL — ABNORMAL LOW (ref 13.0–17.0)
MCH: 25 pg — ABNORMAL LOW (ref 26.0–34.0)
MCHC: 31.8 g/dL (ref 30.0–36.0)
MCV: 78.6 fL — ABNORMAL LOW (ref 80.0–100.0)
Platelets: 355 K/uL (ref 150–400)
RBC: 5.13 MIL/uL (ref 4.22–5.81)
RDW: 17.9 % — ABNORMAL HIGH (ref 11.5–15.5)
WBC: 13.5 K/uL — ABNORMAL HIGH (ref 4.0–10.5)
nRBC: 0 % (ref 0.0–0.2)

## 2023-11-24 LAB — TROPONIN I (HIGH SENSITIVITY)
Troponin I (High Sensitivity): 76 ng/L — ABNORMAL HIGH (ref <18)
Troponin I (High Sensitivity): 64 ng/L — ABNORMAL HIGH (ref <18)

## 2023-11-24 LAB — BRAIN NATRIURETIC PEPTIDE: B Natriuretic Peptide: 1700.4 pg/mL — ABNORMAL HIGH (ref 0.0–100.0)

## 2023-11-24 LAB — URIC ACID: Uric Acid, Serum: 6.7 mg/dL (ref 3.7–8.6)

## 2023-11-24 MED ORDER — EZETIMIBE 10 MG PO TABS
10.0000 mg | ORAL_TABLET | Freq: Every day | ORAL | Status: DC
  Administered 2023-11-24 – 2023-12-03 (×10): 10 mg via ORAL
  Filled 2023-11-24 (×10): qty 1

## 2023-11-24 MED ORDER — LOSARTAN POTASSIUM 50 MG PO TABS
25.0000 mg | ORAL_TABLET | Freq: Every day | ORAL | Status: DC
Start: 2023-11-24 — End: 2023-11-29
  Administered 2023-11-25 – 2023-11-28 (×4): 25 mg via ORAL
  Filled 2023-11-24 (×4): qty 1

## 2023-11-24 MED ORDER — EMPAGLIFLOZIN 10 MG PO TABS
10.0000 mg | ORAL_TABLET | Freq: Every day | ORAL | Status: DC
Start: 2023-11-25 — End: 2023-12-04
  Administered 2023-11-25 – 2023-12-04 (×9): 10 mg via ORAL
  Filled 2023-11-24 (×10): qty 1

## 2023-11-24 MED ORDER — APIXABAN 5 MG PO TABS
5.0000 mg | ORAL_TABLET | Freq: Two times a day (BID) | ORAL | Status: DC
Start: 2023-11-24 — End: 2023-11-27
  Administered 2023-11-24 – 2023-11-26 (×5): 5 mg via ORAL
  Filled 2023-11-24 (×5): qty 1

## 2023-11-24 MED ORDER — FUROSEMIDE 10 MG/ML IJ SOLN
60.0000 mg | Freq: Two times a day (BID) | INTRAMUSCULAR | Status: DC
  Administered 2023-11-25 – 2023-11-28 (×7): 60 mg via INTRAVENOUS
  Filled 2023-11-24 (×7): qty 6

## 2023-11-24 MED ORDER — ALLOPURINOL 100 MG PO TABS
100.0000 mg | ORAL_TABLET | Freq: Every day | ORAL | Status: DC
Start: 2023-11-25 — End: 2023-12-04
  Administered 2023-11-25 – 2023-12-04 (×10): 100 mg via ORAL
  Filled 2023-11-24 (×10): qty 1

## 2023-11-24 MED ORDER — ASPIRIN 81 MG PO CHEW
81.0000 mg | CHEWABLE_TABLET | Freq: Every day | ORAL | Status: DC
Start: 2023-11-24 — End: 2023-11-27
  Administered 2023-11-24 – 2023-11-26 (×3): 81 mg via ORAL
  Filled 2023-11-24 (×3): qty 1

## 2023-11-24 MED ORDER — SODIUM CHLORIDE 0.9 % IV SOLN: 250.0000 mL | INTRAVENOUS | Status: AC | PRN

## 2023-11-24 MED ORDER — FUROSEMIDE 10 MG/ML IJ SOLN: 60.0000 mg | Freq: Two times a day (BID) | INTRAMUSCULAR | Status: DC

## 2023-11-24 MED ORDER — COLCHICINE 0.6 MG PO TABS
0.6000 mg | ORAL_TABLET | Freq: Every day | ORAL | Status: DC
  Administered 2023-11-25 – 2023-12-04 (×10): 0.6 mg via ORAL
  Filled 2023-11-24 (×11): qty 1

## 2023-11-24 MED ORDER — COLCHICINE 0.6 MG PO TABS: 1.2000 mg | ORAL_TABLET | Freq: Once | ORAL | Status: DC

## 2023-11-24 MED ORDER — HYDROCODONE-ACETAMINOPHEN 5-325 MG PO TABS
1.0000 | ORAL_TABLET | ORAL | Status: DC | PRN
  Administered 2023-11-24 – 2023-11-27 (×6): 1 via ORAL
  Filled 2023-11-24 (×6): qty 1

## 2023-11-24 MED ORDER — METOPROLOL SUCCINATE ER 25 MG PO TB24
25.0000 mg | ORAL_TABLET | Freq: Every day | ORAL | Status: DC
Start: 2023-11-24 — End: 2023-11-28
  Administered 2023-11-24 – 2023-11-28 (×5): 25 mg via ORAL
  Filled 2023-11-24 (×5): qty 1

## 2023-11-24 MED ORDER — SODIUM CHLORIDE 0.9% FLUSH: 3.0000 mL | INTRAVENOUS | Status: DC | PRN

## 2023-11-24 MED ORDER — MEXILETINE HCL 150 MG PO CAPS
300.0000 mg | ORAL_CAPSULE | Freq: Two times a day (BID) | ORAL | Status: DC
  Administered 2023-11-24 – 2023-12-04 (×20): 300 mg via ORAL
  Filled 2023-11-24 (×20): qty 2

## 2023-11-24 MED ORDER — SPIRONOLACTONE 25 MG PO TABS
25.0000 mg | ORAL_TABLET | Freq: Every day | ORAL | Status: DC
  Administered 2023-11-24 – 2023-12-04 (×11): 25 mg via ORAL
  Filled 2023-11-24 (×11): qty 1

## 2023-11-24 MED ORDER — FUROSEMIDE 10 MG/ML IJ SOLN
80.0000 mg | Freq: Once | INTRAMUSCULAR | Status: AC
  Administered 2023-11-24: 80 mg via INTRAVENOUS
  Filled 2023-11-24: qty 8

## 2023-11-24 MED ORDER — ONDANSETRON HCL 4 MG/2ML IJ SOLN: 4.0000 mg | Freq: Four times a day (QID) | INTRAMUSCULAR | Status: DC | PRN

## 2023-11-24 MED ORDER — ACETAMINOPHEN 325 MG PO TABS
650.0000 mg | ORAL_TABLET | ORAL | Status: DC | PRN
  Administered 2023-11-29: 650 mg via ORAL
  Filled 2023-11-24 (×2): qty 2

## 2023-11-24 MED ORDER — SODIUM CHLORIDE 0.9% FLUSH
3.0000 mL | Freq: Two times a day (BID) | INTRAVENOUS | Status: DC
  Administered 2023-11-24 – 2023-12-02 (×16): 3 mL via INTRAVENOUS

## 2023-11-24 NOTE — Assessment & Plan Note (Signed)
 Noted. The patient's creatinine on presentation is 1.28. This is not far off of baseline of 1.14. Monitor and hold naproxen  while diuresing.

## 2023-11-24 NOTE — H&P (Signed)
 History and Physical    [name] FMW:[id number] DOB: 12/12/1975 DOA: 11/24/2023 DOS: the patient was seen and examined on 11/24/2023 PCP: Pcp, No  Patient coming from: Home  Chief Complaint:  Chief Complaint  Patient presents with   Shortness of Breath   Ankle Pain   HPI: Philip Monroe is a 48 y.o. male with medical history significant for  HFrEF with recent EF of 19.2 %, mild PAH, CAD, CKD II, Gout, morbid obesity, NSVT, tobacco abuse, and possible history of apical thrombus for which he has been on anticoagulation since 06/19/2018.   The patient has been experiencing increase pain and swelling of his joints recently and has not been taking his diuretics as this makes it difficult for him to get to the toilet. He presents with shortness of breath and increased swelling to lower extremities.  In the ED he is found to have a BNP of 1700, low Na at 131, elevated WBC at 13.5, . He is saturating 97% on room air. CXR demonstrates pulmonary edema.  The patient will be admitted to a telemetry bed. Troponins have been ordered. Echocardiogram will be ordered. He will be diuresed. The patient received 1.2 of colchicine  in the ED. This will be continued as his daily dose.   Review of Systems: As mentioned in the history of present illness. All other systems reviewed and are negative. Past Medical History:  Diagnosis Date   Aortic root dilatation (HCC)    a. 07/2022 Echo: Ao root 4.4cm, Asc Ao 4.0cm.   CAD (coronary artery disease)    a. 08/2017 Cath: LM nl, LAD 44m/d, D1 70ost, LCX 171m, OM1 90 inf branch, 60 sup branch, OM2 small, RCA 30 diffuse, RPDA 100 w/ L->R collats; b. 07/2022 Echo: LM nl, LAD 49m, D1 80, LCX 121m, OM1 80, RCA 46m, RPDA 100-->No targets for PCI/CABG-->Med rx.   CHF (congestive heart failure) (HCC)    takes lasix    Chronic HFrEF (heart failure with reduced ejection fraction) (HCC)    a. 03/2015 Echo: EF 35-40%; b. 08/2017 Echo: EF 15%; c. 08/2017 cMRI: EF 35%; d.  06/2018 Echo: EF 10-15%; e. 07/2022 Echo: EF 15-20%, grIII DD, mild MR, sev dil LA, mod-sev PH, Ao root 4.4cm, Asc Ao 4.0cm; f. 07/2022 RHC: RA 4, RV 38/3, PA 43/18 (26), PCWP 11. CO/CI 5.3/1.8.   CKD (chronic kidney disease), stage II    Gout    Hyperlipidemia LDL goal <70    Hypertension    Ischemic cardiomyopathy    a. 03/2015 Echo: EF 35-40%; b. 08/2017 Echo: EF 15%; c. 08/2017 cMRI: EF 35%; d. 06/2018 Echo: EF 10-15%; e. 07/2022 Echo: EF 15-20%, grIII DD.   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 09/2022 Event monitor: predominantly sinus rhythm, avg HR 81 (46-167). 17 runs NSVT (fastest/longest 13 beats x 167). Rare PACs, 17.5% PVC burden.   PVC's (premature ventricular contractions)    a. 09/2022 Event Monitor: 17.5% PVC burden.   Statin intolerance    Tobacco abuse    History reviewed. No pertinent surgical history. Social History:  reports that he has been smoking cigarettes. He has never used smokeless tobacco. He reports that he does not currently use alcohol after a past usage of about 36.0 standard drinks of alcohol per week. He reports that he does not use drugs.  Allergies  Allergen Reactions  Lactose Intolerance (Gi) Diarrhea   Statins     myalgias    History reviewed. No pertinent family history.  Prior to Admission medications   Medication Sig Start Date End Date Taking? Authorizing Provider  allopurinol  (ZYLOPRIM ) 100 MG tablet Take 1 tablet (100 mg total) by mouth daily. 09/14/23 12/13/23  Viviann Pastor, MD  aspirin  81 MG chewable tablet Chew 1 tablet (81 mg total) by mouth daily. 05/24/23   Caleen Qualia, MD  colchicine  0.6 MG tablet Take 1 tablet (0.6 mg total) by mouth 2 (two) times daily. 09/26/23   Amin, Sumayya, MD  cyanocobalamin  (VITAMIN B12) 1000 MCG tablet Take 1 tablet (1,000 mcg total) by mouth daily. 05/24/23   Caleen Qualia, MD  ELIQUIS   5 MG TABS tablet Take 1 tablet (5 mg total) by mouth 2 (two) times daily. 09/14/23 12/13/23  Viviann Pastor, MD  empagliflozin  (JARDIANCE ) 10 MG TABS tablet Take 1 tablet (10 mg total) by mouth daily. 09/14/23 12/13/23  Viviann Pastor, MD  ezetimibe  (ZETIA ) 10 MG tablet Take 1 tablet (10 mg total) by mouth at bedtime. 09/14/23 12/13/23  Viviann Pastor, MD  HYDROcodone -acetaminophen  (NORCO/VICODIN) 5-325 MG tablet Take 1 tablet by mouth every 4 (four) hours as needed. 06/21/23   Margrette, Myah A, PA-C  losartan  (COZAAR ) 25 MG tablet Take 1 tablet (25 mg total) by mouth daily. 06/22/23   Donette Ellouise LABOR, FNP  metoprolol  succinate (TOPROL -XL) 25 MG 24 hr tablet Take 1 tablet (25 mg total) by mouth daily. 07/04/23   Bensimhon, Toribio SAUNDERS, MD  mexiletine (MEXITIL ) 150 MG capsule Take 2 capsules (300 mg total) by mouth 2 (two) times daily. 08/08/23   Bensimhon, Daniel R, MD  Multiple Vitamin (MULTIVITAMIN WITH MINERALS) TABS tablet Take 1 tablet by mouth daily. 05/24/23   Amin, Sumayya, MD  naproxen  (NAPROSYN ) 500 MG tablet Take 1 tablet (500 mg total) by mouth 2 (two) times daily with a meal. 09/14/23   Viviann Pastor, MD  spironolactone  (ALDACTONE ) 25 MG tablet Take 1 tablet (25 mg total) by mouth daily. 09/14/23 12/13/23  Viviann Pastor, MD  torsemide  40 MG TABS Take 40 mg by mouth daily. 09/27/23   Caleen Qualia, MD    Physical Exam: Vitals:   11/24/23 1608 11/24/23 1611  BP:  (!) 123/100  Pulse:  92  Resp:  18  Temp:  97.9 F (36.6 C)  TempSrc:  Oral  SpO2:  97%  Weight: (!) 176.9 kg   Height: 6' 3 (1.905 m)    Exam:  Constitutional:  The patient is awake, alert, and oriented x 3. No acute distress. Eyes:  pupils and irises appear normal Normal lids and conjunctivae ENMT:  grossly normal hearing  Lips appear normal external ears, nose appear normal Oropharynx: mucosa, tongue,posterior pharynx appear normal Neck:  neck appears normal, no masses, normal ROM, supple no  thyromegaly Respiratory:  No increased work of breathing. Positive for rales. No tactile fremitus Cardiovascular:  Regular rate and rhythm No murmurs, ectopy, or gallups. No lateral PMI. No thrills. Abdomen:  Abdomen is soft, non-tender, non-distended No hernias, masses, or organomegaly Normoactive bowel sounds.  Musculoskeletal:  Positive for 2-3+ pitting edema of the lower extremities Skin:  No rashes, lesions, ulcers palpation of skin: no induration or nodules Neurologic:  CN 2-12 intact Sensation all 4 extremities intact Psychiatric:  Mental status Mood, affect appropriate Orientation to person, place, time  judgment and insight appear intact  Data Reviewed:  CBC BMP CXR BNP  Assessment and Plan:Acute on chronic  combined systolic and diastolic CHF (congestive heart failure) (HCC) Most recent echo 05/20/2023 demonstrated EF of less than 20% (19.2). LV with severely decreased function and global HK. LV internal cavity size is severely dilated and diastolic parameters ar indeterminate. RV function is moderately reduced with normal right ventricular size is normal. He ordinarily takes torsemide  40 mg daily, but he has not been taking it lately, because his joints hurt and it is hard for him to get to the toilet. He presents with increased shortness of breath. BNP is elevated at 1700. His BNP was 1500 in July and 204 in March of this year. The patient will be admitted to a telemetry bed. He will be diuresed. Echocardiogram will be ordered. Troponins have been ordered.  Apical mural thrombus There was a possible apical thrombus identified on TTE from 06/19/2018. The patient has been on anticoagulation since then. Question if it remains essential, if there is no sign of thrombus on recent echo.   Tobacco use disorder Noted. Nicotine  patch will be offered.  Acute on chronic congestive heart failure (HCC) Primarily due to patient's non-compliance with medications. Echocardiogram  has been ordered. The patient will receive IV lasix  to diurese. Monitor BNP. Consider cardiology consult, although this seems to be an issue of compliance with diuretics.   PVC's (premature ventricular contractions) Continue Mexitil .  Hypertension Continue metoprolol , losartan , and spironolactone  as outpatient.  Hyperlipidemia LDL goal <70 Continue Zetia  as at home.  CKD (chronic kidney disease), stage II Noted. The patient's creatinine on presentation is 1.28. This is not far off of baseline of 1.14. Monitor and hold naproxen  while diuresing.  I have seen and examined this patient myself. I have spent 56 minutes in his evaluation and admission.   Advance Care Planning: Full Code  Consults: None  Family Communication: None available  Severity of Illness: The appropriate patient status for this patient is INPATIENT. Inpatient status is judged to be reasonable and necessary in order to provide the required intensity of service to ensure the patient's safety. The patient's presenting symptoms, physical exam findings, and initial radiographic and laboratory data in the context of their chronic comorbidities is felt to place them at high risk for further clinical deterioration. Furthermore, it is not anticipated that the patient will be medically stable for discharge from the hospital within 2 midnights of admission.   * I certify that at the point of admission it is my clinical judgment that the patient will require inpatient hospital care spanning beyond 2 midnights from the point of admission due to high intensity of service, high risk for further deterioration and high frequency of surveillance required.*  Author: Kurtiss Wence, DO 11/24/2023 6:54 PM  For on call review [email or web address].

## 2023-11-24 NOTE — ED Provider Notes (Signed)
 [facility] Emergency Department Provider Note     Event Date/Time   First MD Initiated Contact with Patient 11/24/23 1655     (approximate)   History   Shortness of Breath and Ankle Pain   HPI  Philip Monroe is a 48 y.o. male with a history of HTN, CHF, HLD, CAD, CKD, obesity, and gout, presents to the ED via EMS from home.  Patient reports shortness of breath.  He is also endorsing some swelling to his ankle and lower legs bilaterally.  Patient admits to being noncompliant with his home meds lately.  He reports symptoms that feels like he is retaining fluid. He reports noncompliance with his Lasix  secondary to increased pain at the knee joint and ankles bilaterally.  He was previously prescribed colchicine  which he was taking daily, and he has since run out of that medication.  Physical Exam   Triage Vital Signs: ED Triage Vitals  Encounter Vitals Group     BP 11/24/23 1611 (!) 123/100     Girls Systolic BP Percentile --      Girls Diastolic BP Percentile --      Boys Systolic BP Percentile --      Boys Diastolic BP Percentile --      Pulse Rate 11/24/23 1611 92     Resp 11/24/23 1611 18     Temp 11/24/23 1611 97.9 F (36.6 C)     Temp Source 11/24/23 1611 Oral     SpO2 11/24/23 1611 97 %     Weight 11/24/23 1608 (!) 390 lb (176.9 kg)     Height 11/24/23 1608 6' 3 (1.905 m)     Head Circumference --      Peak Flow --      Pain Score 11/24/23 1608 10     Pain Loc --      Pain Education --      Exclude from Growth Chart --     Most recent vital signs: Vitals:   11/24/23 1611  BP: (!) 123/100  Pulse: 92  Resp: 18  Temp: 97.9 F (36.6 C)  SpO2: 97%    General Awake, no distress. NAD HEENT NCAT. PERRL. EOMI. No rhinorrhea. Mucous membranes are moist.  CV:  Good peripheral perfusion. RRR; 2+ pitting edema BLE RESP:  Normal effort.  ABD:  No distention.  MSK:  AROM of all extremities.  Mildly tender to palpation to the right knee  and ankle joints bilaterally.  Minimal joint effusion noted to the right knee.  No evidence of internal derangement to the right knee.   ED Results / Procedures / Treatments   Labs (all labs ordered are listed, but only abnormal results are displayed) Labs Reviewed  BASIC METABOLIC PANEL WITH GFR - Abnormal; Notable for the following components:      Result Value   Sodium 131 (*)    Glucose, Bld 114 (*)    BUN 22 (*)    Creatinine, Ser 1.28 (*)    Calcium 8.6 (*)    All other components within normal limits  CBC - Abnormal; Notable for the following components:   WBC 13.5 (*)    Hemoglobin 12.8 (*)    MCV 78.6 (*)    MCH 25.0 (*)    RDW 17.9 (*)    All other components within normal limits  HEPATIC FUNCTION PANEL - Abnormal; Notable for the following components:   Albumin 3.1 (*)    Total Bilirubin 1.8 (*)  Bilirubin, Direct 0.6 (*)    Indirect Bilirubin 1.2 (*)    All other components within normal limits  BRAIN NATRIURETIC PEPTIDE - Abnormal; Notable for the following components:   B Natriuretic Peptide 1,700.4 (*)    All other components within normal limits  URIC ACID    EKG  Vent. rate 79 BPM PR interval 196 ms QRS duration 134 ms QT/QTcB 430/493 ms P-R-T axes 8 -22 114 Normal sinus rhythm Non-specific intra-ventricular conduction block Minimal voltage criteria for LVH, may be normal variant ( Cornell product ) T wave abnormality, consider lateral ischemia Abnormal ECG When compared with ECG of 24-Nov-2023 16:17, (unconfirmed) No STEMI  RADIOLOGY  I personally viewed and evaluated these images as part of my medical decision making, as well as reviewing the written report by the radiologist.  ED Provider Interpretation: No acute findings  DG Chest 2 View Result Date: 11/24/2023 EXAM: 2 VIEW(S) XRAY OF THE CHEST 11/24/2023 04:29:00 PM COMPARISON: 09/25/2023 CLINICAL HISTORY: Reports SOB since last Monday. Ankle pain and swelling gradually worsening. Now  having pain in bilateral elbows. Non-compliant with meds recently due to not being able to ambulate well from ankle issues. Patient feels like he has fluid gain. History of CHF. FINDINGS: LUNGS AND PLEURA: Mild pulmonary vascular congestion, stable to slightly improved. No focal pulmonary opacity. No pulmonary edema. No pleural effusion. No pneumothorax. HEART AND MEDIASTINUM: Cardiomegaly, unchanged. No acute abnormality of the cardiac and mediastinal silhouettes. BONES AND SOFT TISSUES: No acute osseous abnormality. IMPRESSION: 1. Cardiomegaly, unchanged. 2. Mild pulmonary vascular congestion, stable to slightly improved. Electronically signed by: Lonni Necessary MD 11/24/2023 04:33 PM EDT RP Workstation: [id number]     PROCEDURES:  Critical Care performed: No  Procedures   MEDICATIONS ORDERED IN ED: Medications  furosemide  (LASIX ) injection 80 mg (has no administration in time range)  colchicine  tablet 1.2 mg (has no administration in time range)     IMPRESSION / MDM / ASSESSMENT AND PLAN / ED COURSE  I reviewed the triage vital signs and the nursing notes.                              Differential diagnosis includes, but is not limited to, viral syndrome, bronchitis including COPD exacerbation, pneumonia, reactive airway disease including asthma, CHF including exacerbation with or without pulmonary/interstitial edema, pneumothorax, ACS, thoracic trauma, and pulmonary embolism.   Patient's presentation is most consistent with acute presentation with potential threat to life or bodily function.  Patient's diagnosis is consistent with exacerbation of CHF and joint pain likely due to gouty arthritis.  Patient presents to the ED via EMS from home, endorsing some intermittent shortness of breath as well as ongoing right knee pain.  Patient by his report has had increasing pain to the ankles and knee joint due to gout.  This pain has decreased his mobility, and as such she has been  noncompliant with his Lasix  as well as several other medications.  He presents to the ED in no acute distress.  Exam overall is reassuring.  Patient was found to have a BNP of 1700.  Remaining labs are consistent with prior history of CKD and mildly elevated WBCs stable and likely not indicating acute infectious process.  Patient will be given IV diuretics, but a service for acute CHF exacerbation.  Patient is agreeable to the plan at this time.  ----------------------------------------- 6:21 PM on 11/24/2023 ----------------------------------------- Hospital service has been consulted, and will  admit the patient for CHF exacerbation as discussed.  FINAL CLINICAL IMPRESSION(S) / ED DIAGNOSES   Final diagnoses:  Acute on chronic congestive heart failure, unspecified heart failure type (HCC)  Arthralgia of right knee     Rx / DC Orders   ED Discharge Orders     None        Note:  This document was prepared using Dragon voice recognition software and may include unintentional dictation errors.    Loyd Candida LULLA Aldona, PA-C 11/24/23 TRENNA Floy Roberts, MD 11/24/23 [phone]

## 2023-11-24 NOTE — Assessment & Plan Note (Signed)
 Continue metoprolol , losartan , and spironolactone  as outpatient.

## 2023-11-24 NOTE — ED Notes (Signed)
 Pt refused male purewick and was given a urinal to use. Pt has urinal and call bell within reach.

## 2023-11-24 NOTE — Assessment & Plan Note (Signed)
 There was a possible apical thrombus identified on TTE from 06/19/2018. The patient has been on anticoagulation since then. Question if it remains essential, if there is no sign of thrombus on recent echo.

## 2023-11-24 NOTE — Assessment & Plan Note (Addendum)
 Noted. Nicotine  patch will be offered.

## 2023-11-24 NOTE — ED Triage Notes (Signed)
 Arrived by [facility] from home. Reports sob. Since last Monday his ankles have been gradually getting worse with pain and swelling. Now having pain in bilateral elbows. Non compliant with meds recently due to not being able to ambulate well from ankle issues. Patient feels like he has fluid gain   History CHF

## 2023-11-24 NOTE — Assessment & Plan Note (Addendum)
 Most recent echo 05/20/2023 demonstrated EF of less than 20% (19.2). LV with severely decreased function and global HK. LV internal cavity size is severely dilated and diastolic parameters ar indeterminate. RV function is moderately reduced with normal right ventricular size is normal. He ordinarily takes torsemide  40 mg daily, but he has not been taking it lately, because his joints hurt and it is hard for him to get to the toilet. He presents with increased shortness of breath. BNP is elevated at 1700. His BNP was 1500 in July and 204 in March of this year. The patient will be admitted to a telemetry bed. He will be diuresed. Echocardiogram will be ordered. Troponins have been ordered.

## 2023-11-24 NOTE — Assessment & Plan Note (Signed)
 Continue Zetia  as at home.

## 2023-11-24 NOTE — Assessment & Plan Note (Signed)
 Primarily due to patient's non-compliance with medications. Echocardiogram has been ordered. The patient will receive IV lasix  to diurese. Monitor BNP. Consider cardiology consult, although this seems to be an issue of compliance with diuretics.

## 2023-11-24 NOTE — Assessment & Plan Note (Signed)
 Continue Mexitil .

## 2023-11-24 NOTE — ED Notes (Signed)
 Lab called to add on Troponin.

## 2023-11-25 ENCOUNTER — Inpatient Hospital Stay

## 2023-11-25 DIAGNOSIS — I5043 Acute on chronic combined systolic (congestive) and diastolic (congestive) heart failure: Secondary | ICD-10-CM | POA: Diagnosis not present

## 2023-11-25 LAB — BASIC METABOLIC PANEL WITH GFR
Anion gap: 9 (ref 5–15)
BUN: 25 mg/dL — ABNORMAL HIGH (ref 6–20)
CO2: 24 mmol/L (ref 22–32)
Calcium: 8.6 mg/dL — ABNORMAL LOW (ref 8.9–10.3)
Chloride: 99 mmol/L (ref 98–111)
Creatinine, Ser: 1.36 mg/dL — ABNORMAL HIGH (ref 0.61–1.24)
GFR, Estimated: >60 mL/min (ref >60)
Glucose, Bld: 93 mg/dL (ref 70–99)
Potassium: 3.6 mmol/L (ref 3.5–5.1)
Sodium: 132 mmol/L — ABNORMAL LOW (ref 135–145)

## 2023-11-25 NOTE — Progress Notes (Signed)
 Progress Note   [name] FMW:[id number] DOB: March 27, 1975 DOA: 11/24/2023     1 DOS: the patient was seen and examined on 11/25/2023   Brief hospital course: Philip Monroe is a 48 y.o. male with medical history significant for  HFrEF with recent EF of 19.2 %, mild PAH, CAD, CKD II, Gout, morbid obesity, NSVT, tobacco abuse, and possible history of apical thrombus for which he has been on anticoagulation since 06/19/2018.    The patient has been experiencing increase pain and swelling of his joints recently and has not been taking his diuretics as this makes it difficult for him to get to the toilet. He presents with shortness of breath and increased swelling to lower extremities.   In the ED he is found to have a BNP of 1700, low Na at 131, elevated WBC at 13.5, . He is saturating 97% on room air. CXR demonstrates pulmonary edema.   The patient will be admitted to a telemetry bed. Troponins have been ordered. Echocardiogram will be ordered. He will be diuresed. The patient received 1.2 of colchicine  in the ED. This will be continued as his daily dose.   Assessment and Plan:  Acute on chronic combined systolic and diastolic CHF (congestive heart failure) (HCC) Most recent echo 05/20/2023 demonstrated EF of less than 20% BNP is elevated at 1700.  His BNP was 1500 in July and 204 in March of this year.  Follow-up on echocardiogram Continue diuresis Monitor input and output   Apical mural thrombus There was a possible apical thrombus identified on TTE from 06/19/2018.  Patient takes Eliquis    Tobacco use disorder Noted. Nicotine  patch will be offered.   PVC's (premature ventricular contractions) Continue Mexitil .   Hypertension Continue metoprolol , losartan , and spironolactone  as outpatient.   Hyperlipidemia LDL goal <70 Continue Zetia  as at home.   CKD (chronic kidney disease), stage II Noted. The patient's creatinine on presentation is 1.28. This is not far off of  baseline of 1.14. Monitor and hold naproxen  while diuresing.     Advance Care Planning: Full Code   Consults: None   Family Communication: None available    Subjective:  Patient seen and examined at bedside this morning He tells me he is slightly better than yesterday Denies chest pain nausea or vomiting  Physical Exam:  Constitutional:  The patient is awake, alert, and oriented x 3. No acute distress.  Respiratory:  No increased work of breathing. Positive for rales. No tactile fremitus Cardiovascular:  Regular rate and rhythm No murmurs, ectopy, or gallups. No lateral PMI. No thrills. Abdomen:  Abdomen is soft, non-tender, non-distended No hernias, masses, or organomegaly Normoactive bowel sounds.  Musculoskeletal:  Positive for 2-3+ pitting edema of the lower extremities Skin:  No rashes, lesions, ulcers palpation of skin: no induration or nodules Neurologic:  CN 2-12 intact    Vitals:   11/25/23 0400 11/25/23 0500 11/25/23 0832 11/25/23 1204  BP: 111/73  105/62 95/69  Pulse: 78  72 69  Resp: 18     Temp: 98.8 F (37.1 C)  98.1 F (36.7 C) 97.7 F (36.5 C)  TempSrc: Oral  Oral   SpO2: 100%  95% 97%  Weight:  (!) 137 kg    Height:        Data Reviewed: X-ray review showing findings of pulmonary edema    Latest Ref Rng & Units 11/24/2023    4:11 PM 09/26/2023    3:37 AM 09/25/2023    9:58 AM  CBC  WBC 4.0 - 10.5 K/uL 13.5  12.6  10.4   Hemoglobin 13.0 - 17.0 g/dL 87.1  86.7  86.3   Hematocrit 39.0 - 52.0 % 40.3  42.2  42.7   Platelets 150 - 400 K/uL 355  343  363        Latest Ref Rng & Units 11/25/2023    4:27 AM 11/24/2023    4:11 PM 09/26/2023    3:37 AM  BMP  Glucose 70 - 99 mg/dL 93  885  862   BUN 6 - 20 mg/dL 25  22  26    Creatinine 0.61 - 1.24 mg/dL 8.63  8.71  8.84   Sodium 135 - 145 mmol/L 132  131  136   Potassium 3.5 - 5.1 mmol/L 3.6  3.9  3.9   Chloride 98 - 111 mmol/L 99  99  102   CO2 22 - 32 mmol/L 24  22  24    Calcium 8.9  - 10.3 mg/dL 8.6  8.6  9.0      Family Communication: None present at bedside  Disposition: Status is: Inpatient   Time spent: 52 minutes  Author: Drue ONEIDA Potter, MD 11/25/2023 3:24 PM  For on call review [email or web address].

## 2023-11-25 NOTE — Plan of Care (Signed)
  Problem: Clinical Measurements: Goal: Respiratory complications will improve Outcome: Progressing   Problem: Nutrition: Goal: Adequate nutrition will be maintained Outcome: Progressing   Problem: Safety: Goal: Ability to remain free from injury will improve Outcome: Progressing   

## 2023-11-25 NOTE — Plan of Care (Signed)
   Problem: Activity: Goal: Risk for activity intolerance will decrease Outcome: Progressing   Problem: Nutrition: Goal: Adequate nutrition will be maintained Outcome: Progressing   Problem: Coping: Goal: Level of anxiety will decrease Outcome: Progressing

## 2023-11-26 ENCOUNTER — Inpatient Hospital Stay
Admit: 2023-11-26 | Discharge: 2023-11-26 | Disposition: A | Discharge status: Home / Self Care | Attending: Internal Medicine | Admitting: Internal Medicine

## 2023-11-26 DIAGNOSIS — I5021 Acute systolic (congestive) heart failure: Secondary | ICD-10-CM

## 2023-11-26 DIAGNOSIS — I5043 Acute on chronic combined systolic (congestive) and diastolic (congestive) heart failure: Secondary | ICD-10-CM | POA: Diagnosis not present

## 2023-11-26 LAB — ECHOCARDIOGRAM COMPLETE
AR max vel: 3.31 cm2
AV Peak grad: 7.8 mmHg
Ao pk vel: 1.4 m/s
Area-P 1/2: 4.49 cm2
Calc EF: 22.3 %
Est EF: <20
Height: 75 in
MV M vel: 4.69 m/s
MV Peak grad: 88 mmHg
Radius: 0.6 cm
S' Lateral: 9.1 cm
Single Plane A2C EF: 21.4 %
Single Plane A4C EF: 26.8 %
Weight: 4832.48 [oz_av]

## 2023-11-26 LAB — BASIC METABOLIC PANEL WITH GFR
Anion gap: 10 (ref 5–15)
BUN: 27 mg/dL — ABNORMAL HIGH (ref 6–20)
CO2: 27 mmol/L (ref 22–32)
Calcium: 8.7 mg/dL — ABNORMAL LOW (ref 8.9–10.3)
Chloride: 96 mmol/L — ABNORMAL LOW (ref 98–111)
Creatinine, Ser: 1.22 mg/dL (ref 0.61–1.24)
GFR, Estimated: >60 mL/min (ref >60)
Glucose, Bld: 88 mg/dL (ref 70–99)
Potassium: 3.9 mmol/L (ref 3.5–5.1)
Sodium: 133 mmol/L — ABNORMAL LOW (ref 135–145)

## 2023-11-26 LAB — CBC WITH DIFFERENTIAL/PLATELET
Abs Immature Granulocytes: 0.05 K/uL (ref 0.00–0.07)
Basophils Absolute: 0.1 K/uL (ref 0.0–0.1)
Basophils Relative: 1 %
Eosinophils Absolute: 0.2 K/uL (ref 0.0–0.5)
Eosinophils Relative: 2 %
HCT: 40.4 % (ref 39.0–52.0)
Hemoglobin: 12.7 g/dL — ABNORMAL LOW (ref 13.0–17.0)
Immature Granulocytes: 1 %
Lymphocytes Relative: 34 %
Lymphs Abs: 3.6 K/uL (ref 0.7–4.0)
MCH: 24.7 pg — ABNORMAL LOW (ref 26.0–34.0)
MCHC: 31.4 g/dL (ref 30.0–36.0)
MCV: 78.6 fL — ABNORMAL LOW (ref 80.0–100.0)
Monocytes Absolute: 1.1 K/uL — ABNORMAL HIGH (ref 0.1–1.0)
Monocytes Relative: 10 %
Neutro Abs: 5.7 K/uL (ref 1.7–7.7)
Neutrophils Relative %: 52 %
Platelets: 381 K/uL (ref 150–400)
RBC: 5.14 MIL/uL (ref 4.22–5.81)
RDW: 18.1 % — ABNORMAL HIGH (ref 11.5–15.5)
WBC: 10.7 K/uL — ABNORMAL HIGH (ref 4.0–10.5)
nRBC: 0 % (ref 0.0–0.2)

## 2023-11-26 MED ORDER — PERFLUTREN LIPID MICROSPHERE
1.0000 mL | INTRAVENOUS | Status: AC | PRN
  Administered 2023-11-26: 5 mL via INTRAVENOUS

## 2023-11-26 NOTE — Plan of Care (Signed)

## 2023-11-26 NOTE — Progress Notes (Signed)
  Echocardiogram 2D Echocardiogram has been performed. Definity  IV ultrasound imaging agent used on this study.  Philip Monroe 11/26/2023, 10:52 AM

## 2023-11-26 NOTE — Progress Notes (Signed)
 Progress Note   [name] FMW:[id number] DOB: 06-May-1975 DOA: 11/24/2023     2 DOS: the patient was seen and examined on 11/26/2023   Brief hospital course: Philip Monroe is a 48 y.o. male with medical history significant for  HFrEF with recent EF of 19.2 %, mild PAH, CAD, CKD II, Gout, morbid obesity, NSVT, tobacco abuse, and possible history of apical thrombus for which he has been on anticoagulation since 06/19/2018.    The patient has been experiencing increase pain and swelling of his joints recently and has not been taking his diuretics as this makes it difficult for him to get to the toilet. He presents with shortness of breath and increased swelling to lower extremities.   In the ED he is found to have a BNP of 1700, low Na at 131, elevated WBC at 13.5, . He is saturating 97% on room air. CXR demonstrates pulmonary edema.   The patient will be admitted to a telemetry bed. Troponins have been ordered. Echocardiogram will be ordered. He will be diuresed. The patient received 1.2 of colchicine  in the ED. This will be continued as his daily dose.    Assessment and Plan:   Acute on chronic combined systolic and diastolic CHF (congestive heart failure) (HCC) Most recent echo 05/20/2023 demonstrated EF of less than 20% BNP is elevated at 1700.  His BNP was 1500 in July and 204 in March of this year.  Follow-up on echocardiogram Continue diuresis Monitor input and output   Apical mural thrombus There was a possible apical thrombus identified on TTE from 06/19/2018.  Patient takes Eliquis    Tobacco use disorder Noted. Nicotine  patch will be offered.   PVC's (premature ventricular contractions) Continue Mexitil .   Hypertension Continue metoprolol , losartan , and spironolactone  as outpatient.   Hyperlipidemia LDL goal <70 Continue Zetia  as at home.   CKD (chronic kidney disease), stage II Noted. The patient's creatinine on presentation is 1.28. This is not far off of  baseline of 1.14. Monitor and hold naproxen  while diuresing.      Advance Care Planning: Full Code   Consults: None   Family Communication: None available       Subjective:  Patient seen and examined at bedside this morning He admits to improvement in respiratory function   Physical Exam:  Constitutional:  The patient is awake, alert, and oriented x 3. No acute distress.   Respiratory:  No increased work of breathing. Positive for rales. No tactile fremitus Cardiovascular:  Regular rate and rhythm No murmurs, ectopy, or gallups. No lateral PMI. No thrills. Abdomen:  Abdomen is soft, non-tender, non-distended No hernias, masses, or organomegaly Normoactive bowel sounds.  Musculoskeletal:  Positive for 2-3+ pitting edema of the lower extremities Skin:  No rashes, lesions, ulcers palpation of skin: no induration or nodules Neurologic:  CN 2-12 intact    Data Reviewed:    Latest Ref Rng & Units 11/26/2023    4:25 AM 11/25/2023    4:27 AM 11/24/2023    4:11 PM  BMP  Glucose 70 - 99 mg/dL 88  93  885   BUN 6 - 20 mg/dL 27  25  22    Creatinine 0.61 - 1.24 mg/dL 8.77  8.63  8.71   Sodium 135 - 145 mmol/L 133  132  131   Potassium 3.5 - 5.1 mmol/L 3.9  3.6  3.9   Chloride 98 - 111 mmol/L 96  99  99   CO2 22 - 32 mmol/L  27  24  22    Calcium 8.9 - 10.3 mg/dL 8.7  8.6  8.6       Vitals:   11/26/23 0500 11/26/23 0828 11/26/23 1129 11/26/23 1532  BP:  102/63 93/63 114/87  Pulse:  (!) 59 73 73  Resp:  18 18 20   Temp:  97.9 F (36.6 C) 97.8 F (36.6 C) 97.9 F (36.6 C)  TempSrc:  Oral Oral Oral  SpO2:  98% 96% 98%  Weight: (!) 137 kg     Height:         Author: Drue ONEIDA Potter, MD 11/26/2023 4:31 PM  For on call review [email or web address].

## 2023-11-27 ENCOUNTER — Other Ambulatory Visit (HOSPITAL_COMMUNITY): Payer: Self-pay

## 2023-11-27 ENCOUNTER — Telehealth (HOSPITAL_COMMUNITY): Payer: Self-pay | Admitting: Pharmacy Technician

## 2023-11-27 DIAGNOSIS — I5043 Acute on chronic combined systolic (congestive) and diastolic (congestive) heart failure: Secondary | ICD-10-CM | POA: Diagnosis not present

## 2023-11-27 DIAGNOSIS — I493 Ventricular premature depolarization: Secondary | ICD-10-CM

## 2023-11-27 DIAGNOSIS — I2510 Atherosclerotic heart disease of native coronary artery without angina pectoris: Secondary | ICD-10-CM | POA: Diagnosis not present

## 2023-11-27 DIAGNOSIS — I5022 Chronic systolic (congestive) heart failure: Secondary | ICD-10-CM

## 2023-11-27 LAB — APTT
aPTT: 30 s (ref 24–36)
aPTT: 51 s — ABNORMAL HIGH (ref 24–36)

## 2023-11-27 LAB — BASIC METABOLIC PANEL WITH GFR
Anion gap: 10 (ref 5–15)
BUN: 28 mg/dL — ABNORMAL HIGH (ref 6–20)
CO2: 28 mmol/L (ref 22–32)
Calcium: 8.5 mg/dL — ABNORMAL LOW (ref 8.9–10.3)
Chloride: 97 mmol/L — ABNORMAL LOW (ref 98–111)
Creatinine, Ser: 1.49 mg/dL — ABNORMAL HIGH (ref 0.61–1.24)
GFR, Estimated: 58 mL/min — ABNORMAL LOW (ref >60)
Glucose, Bld: 109 mg/dL — ABNORMAL HIGH (ref 70–99)
Potassium: 3.7 mmol/L (ref 3.5–5.1)
Sodium: 135 mmol/L (ref 135–145)

## 2023-11-27 LAB — CBC WITH DIFFERENTIAL/PLATELET
Abs Immature Granulocytes: 0.04 K/uL (ref 0.00–0.07)
Basophils Absolute: 0 K/uL (ref 0.0–0.1)
Basophils Relative: 0 %
Eosinophils Absolute: 0.3 K/uL (ref 0.0–0.5)
Eosinophils Relative: 3 %
HCT: 40.9 % (ref 39.0–52.0)
Hemoglobin: 12.5 g/dL — ABNORMAL LOW (ref 13.0–17.0)
Immature Granulocytes: 0 %
Lymphocytes Relative: 32 %
Lymphs Abs: 3 K/uL (ref 0.7–4.0)
MCH: 24.5 pg — ABNORMAL LOW (ref 26.0–34.0)
MCHC: 30.6 g/dL (ref 30.0–36.0)
MCV: 80 fL (ref 80.0–100.0)
Monocytes Absolute: 0.9 K/uL (ref 0.1–1.0)
Monocytes Relative: 9 %
Neutro Abs: 5.2 K/uL (ref 1.7–7.7)
Neutrophils Relative %: 56 %
Platelets: 392 K/uL (ref 150–400)
RBC: 5.11 MIL/uL (ref 4.22–5.81)
RDW: 17.9 % — ABNORMAL HIGH (ref 11.5–15.5)
WBC: 9.5 K/uL (ref 4.0–10.5)
nRBC: 0 % (ref 0.0–0.2)

## 2023-11-27 LAB — HEPARIN LEVEL (UNFRACTIONATED): Heparin Unfractionated: 1.02 [IU]/mL — ABNORMAL HIGH (ref 0.30–0.70)

## 2023-11-27 MED ORDER — PREDNISONE 10 MG PO TABS
40.0000 mg | ORAL_TABLET | Freq: Every day | ORAL | Status: AC
  Administered 2023-11-28 – 2023-12-02 (×5): 40 mg via ORAL
  Filled 2023-11-27 (×3): qty 4
  Filled 2023-11-27: qty 2
  Filled 2023-11-27: qty 4

## 2023-11-27 MED ORDER — HEPARIN (PORCINE) 25000 UT/250ML-% IV SOLN
2200.0000 [IU]/h | INTRAVENOUS | Status: DC
  Administered 2023-11-27: 1900 [IU]/h via INTRAVENOUS
  Filled 2023-11-27 (×2): qty 250

## 2023-11-27 MED ORDER — DIGOXIN 125 MCG PO TABS
0.1250 mg | ORAL_TABLET | Freq: Every day | ORAL | Status: DC
  Administered 2023-11-27 – 2023-12-04 (×8): 0.125 mg via ORAL
  Filled 2023-11-27 (×8): qty 1

## 2023-11-27 MED ORDER — FREE WATER
250.0000 mL | Freq: Once | Status: AC
  Administered 2023-11-28: 250 mL via ORAL

## 2023-11-27 MED ORDER — ASPIRIN 81 MG PO CHEW
81.0000 mg | CHEWABLE_TABLET | ORAL | Status: AC
  Administered 2023-11-28: 81 mg via ORAL
  Filled 2023-11-27: qty 1

## 2023-11-27 NOTE — Progress Notes (Signed)
 Progress Note   [name] FMW:[id number] DOB: 08/03/1975 DOA: 11/24/2023     3 DOS: the patient was seen and examined on 11/27/2023     Brief hospital course: Philip Monroe is a 48 y.o. male with medical history significant for  HFrEF with recent EF of 19.2 %, mild PAH, CAD, CKD II, Gout, morbid obesity, NSVT, tobacco abuse, and possible history of apical thrombus for which he has been on anticoagulation since 06/19/2018.    The patient has been experiencing increase pain and swelling of his joints recently and has not been taking his diuretics as this makes it difficult for him to get to the toilet. He presents with shortness of breath and increased swelling to lower extremities.   In the ED he is found to have a BNP of 1700, low Na at 131, elevated WBC at 13.5, . He is saturating 97% on room air. CXR demonstrates pulmonary edema.   The patient will be admitted to a telemetry bed. Troponins have been ordered. Echocardiogram will be ordered. He will be diuresed. The patient received 1.2 of colchicine  in the ED. This will be continued as his daily dose.    Assessment and Plan:   Acute on chronic combined systolic and diastolic CHF (congestive heart failure) (HCC) Most recent echo 05/20/2023 demonstrated EF of less than 20% BNP is elevated at 1700.  His BNP was 1500 in July and 204 in March of this year.  Echocardiogram still showing EF less than 20% Continue diuresis Monitor input and output Cardiology consulted we appreciate input   Apical mural thrombus There was a possible apical thrombus identified on TTE from 06/19/2018.  Patient takes Eliquis  at home Currently on heparin  drip given possible plans of cardiac catheterization   Tobacco use disorder Noted. Nicotine  patch will be offered.   PVC's (premature ventricular contractions) Continue Mexitil .   Hypertension Continue metoprolol , losartan , and spironolactone  as outpatient.   Hyperlipidemia LDL goal <70 Continue  Zetia  as at home.   CKD (chronic kidney disease), stage II Noted. The patient's creatinine on presentation is 1.28. This is not far off of baseline of 1.14. Monitor and hold naproxen  while diuresing.      Advance Care Planning: Full Code   Consults: None   Family Communication: None available       Subjective:  Patient seen and examined at bedside this morning He admits to improvement in respiratory function   Physical Exam:  Constitutional:  The patient is awake, alert, and oriented x 3. No acute distress.   Respiratory:  No increased work of breathing. Positive for rales. No tactile fremitus Cardiovascular:  Regular rate and rhythm No murmurs, ectopy, or gallups. No lateral PMI. No thrills. Abdomen:  Abdomen is soft, non-tender, non-distended No hernias, masses, or organomegaly Normoactive bowel sounds.  Musculoskeletal:  Positive for 2-3+ pitting edema of the lower extremities Skin:  No rashes, lesions, ulcers palpation of skin: no induration or nodules Neurologic:  CN 2-12 intact     Data Reviewed:    Latest Ref Rng & Units 11/27/2023    3:18 AM 11/26/2023    4:25 AM 11/25/2023    4:27 AM  BMP  Glucose 70 - 99 mg/dL 890  88  93   BUN 6 - 20 mg/dL 28  27  25    Creatinine 0.61 - 1.24 mg/dL 8.50  8.77  8.63   Sodium 135 - 145 mmol/L 135  133  132   Potassium 3.5 - 5.1 mmol/L  3.7  3.9  3.6   Chloride 98 - 111 mmol/L 97  96  99   CO2 22 - 32 mmol/L 28  27  24    Calcium 8.9 - 10.3 mg/dL 8.5  8.7  8.6        Latest Ref Rng & Units 11/27/2023    3:18 AM 11/26/2023    4:25 AM 11/24/2023    4:11 PM  CBC  WBC 4.0 - 10.5 K/uL 9.5  10.7  13.5   Hemoglobin 13.0 - 17.0 g/dL 87.4  87.2  87.1   Hematocrit 39.0 - 52.0 % 40.9  40.4  40.3   Platelets 150 - 400 K/uL 392  381  355     Vitals:   11/27/23 0500 11/27/23 0721 11/27/23 1118 11/27/23 1427  BP:  112/79 117/79 113/79  Pulse:  72 72 70  Resp:  18 18 19   Temp:  98.2 F (36.8 C) 98 F (36.7 C) 97.7 F (36.5  C)  TempSrc:    Oral  SpO2:  96% 99% 99%  Weight: 134.3 kg     Height:         Time spent: 51 minutes  Author: Drue ONEIDA Potter, MD 11/27/2023 3:44 PM  For on call review [email or web address].

## 2023-11-27 NOTE — Progress Notes (Signed)
 Heart Failure Navigator Progress Note  Assessed for Heart & Vascular TOC clinic readiness.  Patient does not meet criteria due to current Advanced Heart Failure Team patient.   Navigator will sign off at this time.  Roxy Horseman, RN, BSN [facility] Heart Failure Navigator Secure Chat Only

## 2023-11-27 NOTE — Discharge Instructions (Addendum)
 Call your assigned Medicaid worker to inquire about getting set up with a primary care provider.  Some PCP options in [place] area- not a comprehensive list  [facility]- [phone] [facility]- [phone] [facility]- [phone] [facility]- [phone] [facility]- [phone] Nichole Molly- [phone]  or [facility] Physician Referral Line [phone]     Transportation Resources for [facility] Area  Agency Name: [facility] Agency Address: [address] [place], [place] [address] Phone: [phone] Email: [email or web address] Website: [email or web address] Service(s) Offered: Housing services, self-sufficiency, congregate meal program, weatherization program, Field seismologist program, emergency food assistance,  housing counseling, home ownership program, wheels-towork program.  Agency Name: [facility] [employer] [phone]) Address: [address], [place], [place] [address] Phone: [phone] Website: [email or web address] Service(s) Offered: Transportation for [employer], subscription and demand response; Dial-a-Ride for citizens 40 years of age or older.  Agency Name: Department of Social Services Address: [address] [place], [place] [address] Phone: [phone] Service(s) Offered: Child support services; child welfare services; food stamps; Medicaid; work first family assistance; and aid with fuel,  rent, food and medicine, transportation assistance.  Agency Name: Disabled [employer] ([employer]) Transportation  Network Phone: [phone] Service(s) Offered: Transports veterans to the [facility] medical center. Call  forty-eight hours in advance and leave the name, telephone  number, date, and time of appointment. Veteran will be  contacted by the driver the day before the appointment to  arrange a pick up point    [employer]A currently provides door to door services. ACTA connects with PART daily for services to [facility]. ACTA also performs contract services to [employer] operates 27 vehicles, all but 3 mini-vans are equipped with lifts for special needs as well as the general public. ACTA drivers are each CDL certified and trained in First Aid and CPR. ACTA was established in 2002 by [employer]. An independent Industrial/product designer. ACTA operates via Cytogeneticist with required local 10% match funding from [place]. ACTA provides over 80,000 passenger trips each year, including [facility] and [employer] sites.  Call at least by 11 AM one business day prior to needing transportation  [employer].                      [place], [place] [address]     Office Hours: Monday-Friday  8 AM - 5 PM

## 2023-11-27 NOTE — Consult Note (Addendum)
 Advanced Heart Failure Team Consult Note   Primary Physician: Pcp, No Cardiologist:  None  Reason for Consultation: CHF  HPI:    Philip Monroe is seen today for evaluation of CHF at the request of Dr. Darron.   Patient is a 48 y.o. 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    Has been followed by [facility] cardiology for iCM/noncompaction CM. RHC 5/24 showed normal LV and RV filling pressures, mild pulmonary hypertension with mild elevation of PVR, low cardiac index 1.8. LHC 5/24 showed severe multivessel CAD including 80% mid RCA, 100% RPDA, 100% mid LCx, 80% OM1, 80% mid-distal LAD. Medically managed, not thought to have good targets for PCI or CABG.    Admitted 05/19/23 with progressive HF symptoms after being noncompliant with meds. Echo EF <20%, LV with GHK, LV severely dilated, RV mod reduced, LA severely dilated, mild-mod MR. Diuresed well with IV lasix . GDMT restarted. D/c weight 360 pounds.   Zio monitor in 5/25 showed 20.8% PVCs, Mexiletine was supposed to be increased to 300 mg daily but looks like he never started the higher dose.   Recently, patient has been having severe gout pain in his ankles and knees.  It has been hard for him to get to the bathroom so he cut back a lot on his torsemide  (only took twice during the week prior to admission).  He was also out of Toprol  XL, spironolactone , Zetia , mexiletine.  He has not been taking losartan .  He was only taking Jardiance , Eliquis , allopurinol , and occasionally torsemide  at home  He was admitted with gout pain as well as worsening dyspnea and lower extremity edema.  He is still smoking.  Feels like torsemide  is not working well, but has not been using regularly either.  Echo this admission showed EF < 20%, severe LV dilation, moderate RV enlargement with low normal systolic function, moderate MR, dilated IVC.   He was  started on Lasix  60 mg IV bid.  I/Os net negative 1917 yesterday, weight trending down.  He says that his breathing is better, still with knee and ankle pain.    Home Medications Prior to Admission medications   Medication Sig Start Date End Date Taking? Authorizing Provider  colchicine  0.6 MG tablet Take 1 tablet (0.6 mg total) by mouth 2 (two) times daily. 09/26/23  Yes Caleen Qualia, MD  allopurinol  (ZYLOPRIM ) 100 MG tablet Take 1 tablet (100 mg total) by mouth daily. Patient not taking: Reported on 11/24/2023 09/14/23 12/13/23  Viviann Pastor, MD  aspirin  81 MG chewable tablet Chew 1 tablet (81 mg total) by mouth daily. Patient not taking: Reported on 11/24/2023 05/24/23   Amin, Sumayya, MD  cyanocobalamin  (VITAMIN B12) 1000 MCG tablet Take 1 tablet (1,000 mcg total) by mouth daily. Patient not taking: Reported on 11/24/2023 05/24/23   Amin, Sumayya, MD  ELIQUIS  5 MG TABS tablet Take 1 tablet (5 mg total) by mouth 2 (two) times daily. Patient not taking: Reported on 11/24/2023 09/14/23 12/13/23  Viviann Pastor, MD  empagliflozin  (JARDIANCE ) 10 MG TABS tablet Take 1 tablet (10 mg total) by mouth daily. Patient not taking: Reported on 11/24/2023 09/14/23 12/13/23  Viviann Pastor, MD  ezetimibe  (ZETIA ) 10 MG tablet Take 1 tablet (10 mg total) by mouth at bedtime. Patient not taking: Reported on 11/24/2023 09/14/23 12/13/23  Viviann Pastor, MD  HYDROcodone -acetaminophen  (NORCO/VICODIN) 5-325 MG tablet Take 1 tablet by mouth every  4 (four) hours as needed. Patient not taking: Reported on 11/24/2023 06/21/23   Margrette Monte A, PA-C  losartan  (COZAAR ) 25 MG tablet Take 1 tablet (25 mg total) by mouth daily. Patient not taking: Reported on 11/24/2023 06/22/23   Donette Ellouise LABOR, FNP  metoprolol  succinate (TOPROL -XL) 25 MG 24 hr tablet Take 1 tablet (25 mg total) by mouth daily. Patient not taking: Reported on 11/24/2023 07/04/23   Bensimhon, Toribio SAUNDERS, MD  mexiletine (MEXITIL ) 150 MG capsule Take 2 capsules (300  mg total) by mouth 2 (two) times daily. Patient not taking: Reported on 11/24/2023 08/08/23   Bensimhon, Toribio SAUNDERS, MD  Multiple Vitamin (MULTIVITAMIN WITH MINERALS) TABS tablet Take 1 tablet by mouth daily. Patient not taking: Reported on 11/24/2023 05/24/23   Amin, Sumayya, MD  naproxen  (NAPROSYN ) 500 MG tablet Take 1 tablet (500 mg total) by mouth 2 (two) times daily with a meal. Patient not taking: Reported on 11/24/2023 09/14/23   Viviann Pastor, MD  spironolactone  (ALDACTONE ) 25 MG tablet Take 1 tablet (25 mg total) by mouth daily. Patient not taking: Reported on 11/24/2023 09/14/23 12/13/23  Viviann Pastor, MD  torsemide  (DEMADEX ) 20 MG tablet Take 40 mg by mouth daily. Patient not taking: Reported on 11/24/2023 09/26/23   [provider]    Past Medical History: Past Medical History:  Diagnosis Date   Aortic root dilatation (HCC)    a. 07/2022 Echo: Ao root 4.4cm, Asc Ao 4.0cm.   CAD (coronary artery disease)    a. 08/2017 Cath: LM nl, LAD 74m/d, D1 70ost, LCX 197m, OM1 90 inf branch, 60 sup branch, OM2 small, RCA 30 diffuse, RPDA 100 w/ L->R collats; b. 07/2022 Echo: LM nl, LAD 43m, D1 80, LCX 145m, OM1 80, RCA 82m, RPDA 100-->No targets for PCI/CABG-->Med rx.   CHF (congestive heart failure) (HCC)    takes lasix    Chronic HFrEF (heart failure with reduced ejection fraction) (HCC)    a. 03/2015 Echo: EF 35-40%; b. 08/2017 Echo: EF 15%; c. 08/2017 cMRI: EF 35%; d. 06/2018 Echo: EF 10-15%; e. 07/2022 Echo: EF 15-20%, grIII DD, mild MR, sev dil LA, mod-sev PH, Ao root 4.4cm, Asc Ao 4.0cm; f. 07/2022 RHC: RA 4, RV 38/3, PA 43/18 (26), PCWP 11. CO/CI 5.3/1.8.   CKD (chronic kidney disease), stage II    Gout    Hyperlipidemia LDL goal <70    Hypertension    Ischemic cardiomyopathy    a. 03/2015 Echo: EF 35-40%; b. 08/2017 Echo: EF 15%; c. 08/2017 cMRI: EF 35%; d. 06/2018 Echo: EF 10-15%; e. 07/2022 Echo: EF 15-20%, grIII DD.   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 09/2022 Event monitor: predominantly sinus rhythm, avg HR 81 (46-167). 17 runs NSVT (fastest/longest 13 beats x 167). Rare PACs, 17.5% PVC burden.   PVC's (premature ventricular contractions)    a. 09/2022 Event Monitor: 17.5% PVC burden.   Statin intolerance    Tobacco abuse     Past Surgical History: History reviewed. No pertinent surgical history.  Family History: History reviewed. No pertinent family history.  Social History: Social History   Socioeconomic History   Marital status: Single    Spouse name: Not on file   Number of children: 4   Years of education: Not on file   Highest education level: 10th grade  Occupational History   Not on file  Tobacco  Use   Smoking status: Every Day    Current packs/day: 0.00    Types: Cigarettes    Last attempt to quit: 03/20/2015    Years since quitting: 8.6   Smokeless tobacco: Never  Vaping Use   Vaping status: Never Used  Substance and Sexual Activity   Alcohol use: Not Currently    Alcohol/week: 36.0 standard drinks of alcohol    Types: 36 Cans of beer per week   Drug use: No   Sexual activity: Yes    Birth control/protection: Coitus interruptus, Condom  Other Topics Concern   Not on file  Social History Narrative   Not on file   Social Drivers of Health   Financial Resource Strain: Medium Risk (05/22/2023)   Overall Financial Resource Strain (CARDIA)    Difficulty of Paying Living Expenses: Somewhat hard  Food Insecurity: No Food Insecurity (11/25/2023)   Hunger Vital Sign    Worried About Running Out of Food in the Last Year: Never true    Ran Out of Food in the Last Year: Never true  Transportation Needs: Unmet Transportation Needs (11/25/2023)   PRAPARE - Administrator, Civil Service (Medical): Yes    Lack of Transportation (Non-Medical): Yes  Physical Activity: Not on file  Stress: Not on file   Social Connections: Not on file    Allergies:  Allergies  Allergen Reactions   Lactose Intolerance (Gi) Diarrhea   Statins     myalgias    Objective:    Vital Signs:   Temp:  [97.8 F (36.6 C)-98.6 F (37 C)] 98.2 F (36.8 C) (09/15 0721) Pulse Rate:  [54-73] 72 (09/15 0721) Resp:  [18-20] 18 (09/15 0721) BP: (92-114)/(63-87) 112/79 (09/15 0721) SpO2:  [96 %-98 %] 96 % (09/15 0721) Weight:  [134.3 kg] 134.3 kg (09/15 0500)    Weight change: Filed Weights   11/25/23 0500 11/26/23 0500 11/27/23 0500  Weight: (!) 137 kg (!) 137 kg 134.3 kg    Intake/Output:   Intake/Output Summary (Last 24 hours) at 11/27/2023 1003 Last data filed at 11/27/2023 0924 Gross per 24 hour  Intake 1318 ml  Output 3750 ml  Net -2432 ml      Physical Exam    General:  Well appearing. No resp difficulty HEENT: normal Neck: supple. JVP 12-14 cm. Carotids 2+ bilat; no bruits. No lymphadenopathy or thyromegaly appreciated. Cor: PMI lateral. Regular rate & rhythm. No rubs, gallops or murmurs. Lungs: clear Abdomen: soft, nontender, nondistended. No hepatosplenomegaly. No bruits or masses. Good bowel sounds. Extremities: no cyanosis, clubbing, rash. 1+ edema to knees.  Neuro: alert & orientedx3, cranial nerves grossly intact. moves all 4 extremities w/o difficulty. Affect pleasant   Telemetry   NSR with PVCs (personally reviewed)  EKG    NSR, IVCD 134 msec  Labs   Basic Metabolic Panel: Recent Labs  Lab 11/24/23 1611 11/25/23 0427 11/26/23 0425 11/27/23 0318  NA 131* 132* 133* 135  K 3.9 3.6 3.9 3.7  CL 99 99 96* 97*  CO2 22 24 27 28   GLUCOSE 114* 93 88 109*  BUN 22* 25* 27* 28*  CREATININE 1.28* 1.36* 1.22 1.49*  CALCIUM 8.6* 8.6* 8.7* 8.5*    Liver Function Tests: Recent Labs  Lab 11/24/23 1618  AST 23  ALT 20  ALKPHOS 64  BILITOT 1.8*  PROT 6.9  ALBUMIN 3.1*   No results for input(s): LIPASE, AMYLASE in the last 168 hours. No results for input(s):  AMMONIA in the last  168 hours.  CBC: Recent Labs  Lab 11/24/23 1611 11/26/23 0425 11/27/23 0318  WBC 13.5* 10.7* 9.5  NEUTROABS  --  5.7 5.2  HGB 12.8* 12.7* 12.5*  HCT 40.3 40.4 40.9  MCV 78.6* 78.6* 80.0  PLT 355 381 392    Cardiac Enzymes: No results for input(s): CKTOTAL, CKMB, CKMBINDEX, TROPONINI in the last 168 hours.  BNP: BNP (last 3 results) Recent Labs    05/26/23 1048 09/25/23 0958 11/24/23 1618  BNP 204.2* 1,509.6* 1,700.4*    ProBNP (last 3 results) No results for input(s): PROBNP in the last 8760 hours.   CBG: No results for input(s): GLUCAP in the last 168 hours.  Coagulation Studies: No results for input(s): LABPROT, INR in the last 72 hours.   Imaging   ECHOCARDIOGRAM COMPLETE Result Date: 11/26/2023    ECHOCARDIOGRAM REPORT   Patient Name:   Philip Monroe Date of Exam: 11/26/2023 Medical Rec #:  [id number]      Height:       75.0 in Accession #:    [id number]     Weight:       302.0 lb Date of Birth:  06-16-1975      BSA:          2.615 m Patient Age:    48 years       BP:           100/59 mmHg Patient Gender: M              HR:           71 bpm. Exam Location:  [facility] Procedure: 2D Echo, Cardiac Doppler, Color Doppler and Intracardiac            Opacification Agent (Both Spectral and Color Flow Doppler were            utilized during procedure). Indications:     CHF I50.21  History:         Patient has prior history of Echocardiogram examinations, most                  recent 05/20/2023.  Sonographer:     Thedora Louder RDCS, FASE Referring Phys:  [id number] [address] BUREAU Diagnosing Phys: Shelda Bruckner MD  Sonographer Comments: Technically difficult study due to poor echo windows. Image acquisition challenging due to patient body habitus. IMPRESSIONS  1. Left ventricular ejection fraction, by estimation, is <20%. The left ventricle has severely decreased function. The left ventricle demonstrates global hypokinesis. The left ventricular  internal cavity size was severely dilated. Indeterminate diastolic filling due to E-A fusion.  2. Right ventricular systolic function is low normal. The right ventricular size is moderately enlarged.  3. Left atrial size was severely dilated.  4. Right atrial size was moderately dilated.  5. The mitral valve is normal in structure. Moderate mitral valve regurgitation. No evidence of mitral stenosis.  6. The aortic valve is grossly normal. Aortic valve regurgitation is not visualized. No aortic stenosis is present.  7. The inferior vena cava is dilated in size with <50% respiratory variability, suggesting right atrial pressure of 15 mmHg. Comparison(s): Changes from prior study are noted. EF remains severely reduced, LV has further dilated. Conclusion(s)/Recommendation(s): No left ventricular mural or apical thrombus/thrombi. FINDINGS  Left Ventricle: Left ventricular ejection fraction, by estimation, is <20%. The left ventricle has severely decreased function. The left ventricle demonstrates global hypokinesis. Definity  contrast agent was given IV to delineate the left ventricular endocardial borders. The left ventricular internal  cavity size was severely dilated. There is no left ventricular hypertrophy. Indeterminate diastolic filling due to E-A fusion. Right Ventricle: The right ventricular size is moderately enlarged. Right vetricular wall thickness was not well visualized. Right ventricular systolic function is low normal. Left Atrium: Left atrial size was severely dilated. Right Atrium: Right atrial size was moderately dilated. Pericardium: Trivial pericardial effusion is present. Mitral Valve: The mitral valve is normal in structure. Moderate mitral valve regurgitation. No evidence of mitral valve stenosis. Tricuspid Valve: The tricuspid valve is grossly normal. Tricuspid valve regurgitation is trivial. No evidence of tricuspid stenosis. Aortic Valve: The aortic valve is grossly normal. Aortic valve  regurgitation is not visualized. No aortic stenosis is present. Aortic valve peak gradient measures 7.8 mmHg. Pulmonic Valve: The pulmonic valve was grossly normal. Pulmonic valve regurgitation is trivial. No evidence of pulmonic stenosis. Aorta: The aortic root, ascending aorta and aortic arch are all structurally normal, with no evidence of dilitation or obstruction. Venous: The inferior vena cava is dilated in size with less than 50% respiratory variability, suggesting right atrial pressure of 15 mmHg. IAS/Shunts: The atrial septum is grossly normal.  LEFT VENTRICLE PLAX 2D LVIDd:         10.00 cm     Diastology LVIDs:         9.10 cm      LV e' medial:    5.77 cm/s LV PW:         1.10 cm      LV E/e' medial:  23.4 LV IVS:        1.10 cm      LV e' lateral:   5.44 cm/s LVOT diam:     2.20 cm      LV E/e' lateral: 24.8 LV SV:         91 LV SV Index:   35 LVOT Area:     3.80 cm  LV Volumes (MOD) LV vol d, MOD A2C: 465.5 ml LV vol d, MOD A4C: 464.5 ml LV vol s, MOD A2C: 366.0 ml LV vol s, MOD A4C: 340.0 ml LV SV MOD A2C:     99.5 ml LV SV MOD A4C:     464.5 ml LV SV MOD BP:      99.7 ml RIGHT VENTRICLE RV Basal diam:  4.84 cm RV S prime:     10.40 cm/s TAPSE (M-mode): 2.5 cm LEFT ATRIUM              Index        RIGHT ATRIUM           Index LA diam:        6.50 cm  2.49 cm/m   RA Area:     28.80 cm LA Vol (A2C):   133.0 ml 50.87 ml/m  RA Volume:   95.60 ml  36.56 ml/m LA Vol (A4C):   152.0 ml 58.14 ml/m LA Biplane Vol: 153.0 ml 58.52 ml/m  AORTIC VALVE                 PULMONIC VALVE AV Area (Vmax): 3.31 cm     PV Vmax:        0.89 m/s AV Vmax:        140.00 cm/s  PV Peak grad:   3.2 mmHg AV Peak Grad:   7.8 mmHg     RVOT Peak grad: 5 mmHg LVOT Vmax:      122.00 cm/s LVOT Vmean:     81.500 cm/s LVOT  VTI:       0.239 m  AORTA Ao Root diam: 3.80 cm Ao Asc diam:  3.20 cm MITRAL VALVE MV Area (PHT): 4.49 cm       SHUNTS MV Decel Time: 169 msec       Systemic VTI:  0.24 m MR Peak grad:    88.0 mmHg    Systemic  Diam: 2.20 cm MR Mean grad:    55.5 mmHg MR Vmax:         469.00 cm/s MR Vmean:        352.0 cm/s MR PISA:         2.26 cm MR PISA Eff ROA: 14 mm MR PISA Radius:  0.60 cm MV E velocity: 135.00 cm/s Shelda Bruckner MD Electronically signed by Shelda Bruckner MD Signature Date/Time: 11/26/2023/2:40:25 PM    Final      Medications:     Current Medications:  allopurinol   100 mg Oral Daily   colchicine   0.6 mg Oral Daily   digoxin   0.125 mg Oral Daily   empagliflozin   10 mg Oral Daily   ezetimibe   10 mg Oral QHS   furosemide   60 mg Intravenous Q12H   losartan   25 mg Oral Daily   metoprolol  succinate  25 mg Oral Daily   mexiletine  300 mg Oral BID   [START ON 11/28/2023] predniSONE   40 mg Oral Q breakfast   sodium chloride  flush  3 mL Intravenous Q12H   spironolactone   25 mg Oral Daily    Infusions:     Assessment/Plan   1. 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  RHC 05/24 showed normal LV and RV filling pressures, mild pulmonary hypertension with mild elevation of PVR, low cardiac index 1.8.  Echo 03/25 with EF <20%, LV with GHK, LV severely dilated, RV mod reduced, LA severely dilated, mild-mod MR.  Echo this admission with EF < 20%, severe LV dilation, moderate RV enlargement with low normal systolic function, moderate MR, dilated IVC.  PVCs may also contribute, 20.8% on Zio monitor in 5/25.  He has run out of most medications and for the last few weeks was only taking Eliquis , torsemide , and Jardiance .  The week prior to admission, he only took torsemide  twice due to gout pain and difficulty getting to the bathroom.  He is volume overloaded on exam still though he seems to be diuresing well. No chest pain, doubt ACS (HS-TnI mildly elevated with no trend).  Creatinine mildly higher at 1.49. SBP 90s-100s, no room for GDMT titration today.  - Lasix  60 mg IV bid for now, follow response.  - Add  digoxin  with suspected low output HF.  - Losartan  25 mg daily restarted.  - spironolactone  25 mg daily restarted.  - Toprol  XL 25 mg daily restarted.  - Continue Jardiance  10 mg daily.  - Would be reasonable to get cardiac MRI to confirm noncompaction diagnosis (after diuresis).  - With weight and smoking, not a candidate for heart transplant.  - With consistently depressed EF, he is an ICD candidate. QRS likely not wide enough to benefit from CRT.  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  CO was low on RHC in 5/24.  Noncompliance with medications complicates situation. With rise in creatinine with diuresis and prior low output, I am concerned that he may need milrinone  gtt to achieve full diuresis. I will arrange for [facility] tomorrow to assess filling pressures,  cardiac output, and coronaries.  We discussed risks/benefits and he agrees to procedure.  Though compliance is a concern, I think that he would be a potential LVAD candidate.  2. CAD:  LHC 5/24 at [facility] showed severe multivessel CAD including 80% mid RCA, 100% RPDA, 100% mid LCx, 80% OM1, 80% mid-distal LAD. Medical management recommended by [facility] cardiology 05/24. No chest pain.  Mild elevation in HS-TnI with no trend this admission is likely due to demand ischemia from volume overload.  - No ASA given Eliquis  use and stable CAD.  - Continue Zetia , has not been able to tolerate statin.  Would refer to lipid clinic for Repatha as outpatient.  - As above, will plan coronary angiography tomorrow.  Will reassess coronaries prior to possible LVAD consideration.  CABG may be a consideration as well but the markedly low EF makes this very high risk. Will need to limit contrast with elevated creatinine.  3. PVCs: Zio 5/25 with 21% burden.  He did not tolerate amiodarone  well and was started on mexiletine. He has not been taking mexiletine recently (out of med x week).  - Restart mexiletine 300 mg bid this admission.  - Restart low dose  Toprol  XL (not taking at home).   - Will need sleep study as outpatient.  4. Morbid obesity: GLP-1 agonist would be ideal as outpatient.  5. Tobacco use: Smoking about 1/2 ppd.  6. 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  - On Eliquis , transition to heparin  gtt for cath then back to Eliquis .  7. Gout: Very symptomatic, knees and ankles.  Has been on allopurinol  and colchicine .  Had not been taking torsemide  regularly at home so that he did not have to walk to the bathroom with gout pain.  - Would add prednisone  burst for ongoing gouty pain.   Length of Stay: 3  Ezra Shuck, MD  11/27/2023, 10:03 AM    Advanced Heart Failure Team Pager [phone] (M-F; 7a - 5p)  Please contact [facility] for night-coverage after hours (4p -7a ) and weekends on [email or web address]

## 2023-11-27 NOTE — Consult Note (Signed)
 PHARMACY - ANTICOAGULATION CONSULT NOTE  Pharmacy Consult for Heparin  infusion Indication: LV thrombus  Allergies  Allergen Reactions   Lactose Intolerance (Gi) Diarrhea   Statins     myalgias    Patient Measurements: Height: 6' 3 (190.5 cm) Weight: 134.3 kg (296 lb 1.2 oz) IBW/kg (Calculated) : 84.5 HEPARIN  DW (KG): 115.2  Vital Signs: Temp: 98.2 F (36.8 C) (09/15 0721) BP: 112/79 (09/15 0721) Pulse Rate: 72 (09/15 0721)  Labs: Recent Labs    11/24/23 1611 11/24/23 1618 11/24/23 2257 11/25/23 0427 11/26/23 0425 11/27/23 0318  HGB 12.8*  --   --   --  12.7* 12.5*  HCT 40.3  --   --   --  40.4 40.9  PLT 355  --   --   --  381 392  CREATININE 1.28*  --   --  1.36* 1.22 1.49*  TROPONINIHS  --  76* 64*  --   --   --     Estimated Creatinine Clearance: 90.5 mL/min (A) (by C-G formula based on SCr of 1.49 mg/dL (H)).   Medical History: Past Medical History:  Diagnosis Date   Aortic root dilatation (HCC)    a. 07/2022 Echo: Ao root 4.4cm, Asc Ao 4.0cm.   CAD (coronary artery disease)    a. 08/2017 Cath: LM nl, LAD 59m/d, D1 70ost, LCX 157m, OM1 90 inf branch, 60 sup branch, OM2 small, RCA 30 diffuse, RPDA 100 w/ L->R collats; b. 07/2022 Echo: LM nl, LAD 55m, D1 80, LCX 14m, OM1 80, RCA 95m, RPDA 100-->No targets for PCI/CABG-->Med rx.   CHF (congestive heart failure) (HCC)    takes lasix    Chronic HFrEF (heart failure with reduced ejection fraction) (HCC)    a. 03/2015 Echo: EF 35-40%; b. 08/2017 Echo: EF 15%; c. 08/2017 cMRI: EF 35%; d. 06/2018 Echo: EF 10-15%; e. 07/2022 Echo: EF 15-20%, grIII DD, mild MR, sev dil LA, mod-sev PH, Ao root 4.4cm, Asc Ao 4.0cm; f. 07/2022 RHC: RA 4, RV 38/3, PA 43/18 (26), PCWP 11. CO/CI 5.3/1.8.   CKD (chronic kidney disease), stage II    Gout    Hyperlipidemia LDL goal <70    Hypertension    Ischemic cardiomyopathy    a. 03/2015 Echo: EF 35-40%; b. 08/2017 Echo: EF 15%; c. 08/2017 cMRI: EF 35%; d. 06/2018 Echo: EF 10-15%; e. 07/2022 Echo: EF  15-20%, grIII DD.   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 09/2022 Event monitor: predominantly sinus rhythm, avg HR 81 (46-167). 17 runs NSVT (fastest/longest 13 beats x 167). Rare PACs, 17.5% PVC burden.   PVC's (premature ventricular contractions)    a. 09/2022 Event Monitor: 17.5% PVC burden.   Statin intolerance    Tobacco abuse     Medications:  Apixaban  5mg  po BID - last dose 9/14 @ 2134  Assessment: Pateint with PMH including HFrEF with recent EF of 19.2 %, mild PAH, CAD, CKD II, Gout, morbid obesity, NSVT, and tobacco abuse. Patient reports taking Elqius PTA received by mail from patient assistance program.(last inpatient dose 9/14 at 2134).  Pharmacy consulted to transition Apixaban  to heparin  infusion for LV thrombus. Plan cath tomorrow 9/16.  Baseline aPTT and HL ordered. Hgb and plt stable. CrCl > 90  Goal of Therapy:  Heparin  level 0.3-0.7 units/ml aPTT 66-102 seconds Monitor platelets by anticoagulation protocol: Yes   Plan:  No BOLUS  due to recent Apixaban  use. Start heparin  infusion at 1900 units/hr Check aPTT 6 hours after start of infusion Continue to monitor H&H and platelets  Kelwin Gibler Rodriguez-Guzman PharmD, BCPS 11/27/2023 10:15 AM

## 2023-11-27 NOTE — Plan of Care (Signed)

## 2023-11-27 NOTE — Consult Note (Signed)
 PHARMACY - ANTICOAGULATION CONSULT NOTE  Pharmacy Consult for Heparin  infusion Indication: LV thrombus  Allergies  Allergen Reactions   Lactose Intolerance (Gi) Diarrhea   Statins     myalgias    Patient Measurements: Height: 6' 3 (190.5 cm) Weight: 134.3 kg (296 lb 1.2 oz) IBW/kg (Calculated) : 84.5 HEPARIN  DW (KG): 115.2  Vital Signs: Temp: 98.7 F (37.1 C) (09/15 1954) Temp Source: Oral (09/15 1427) BP: 113/84 (09/15 1954) Pulse Rate: 67 (09/15 1954)  Labs: Recent Labs    11/24/23 2257 11/25/23 0427 11/26/23 0425 11/27/23 0318 11/27/23 1013 11/27/23 1913  HGB  --   --  12.7* 12.5*  --   --   HCT  --   --  40.4 40.9  --   --   PLT  --   --  381 392  --   --   APTT  --   --   --   --  30 51*  HEPARINUNFRC  --   --   --   --  1.02*  --   CREATININE  --  1.36* 1.22 1.49*  --   --   TROPONINIHS 64*  --   --   --   --   --     Estimated Creatinine Clearance: 90.5 mL/min (A) (by C-G formula based on SCr of 1.49 mg/dL (H)).   Medical History: Past Medical History:  Diagnosis Date   Aortic root dilatation (HCC)    a. 07/2022 Echo: Ao root 4.4cm, Asc Ao 4.0cm.   CAD (coronary artery disease)    a. 08/2017 Cath: LM nl, LAD 97m/d, D1 70ost, LCX 16m, OM1 90 inf branch, 60 sup branch, OM2 small, RCA 30 diffuse, RPDA 100 w/ L->R collats; b. 07/2022 Echo: LM nl, LAD 27m, D1 80, LCX 12m, OM1 80, RCA 2m, RPDA 100-->No targets for PCI/CABG-->Med rx.   CHF (congestive heart failure) (HCC)    takes lasix    Chronic HFrEF (heart failure with reduced ejection fraction) (HCC)    a. 03/2015 Echo: EF 35-40%; b. 08/2017 Echo: EF 15%; c. 08/2017 cMRI: EF 35%; d. 06/2018 Echo: EF 10-15%; e. 07/2022 Echo: EF 15-20%, grIII DD, mild MR, sev dil LA, mod-sev PH, Ao root 4.4cm, Asc Ao 4.0cm; f. 07/2022 RHC: RA 4, RV 38/3, PA 43/18 (26), PCWP 11. CO/CI 5.3/1.8.   CKD (chronic kidney disease), stage II    Gout    Hyperlipidemia LDL goal <70    Hypertension    Ischemic cardiomyopathy    a.  03/2015 Echo: EF 35-40%; b. 08/2017 Echo: EF 15%; c. 08/2017 cMRI: EF 35%; d. 06/2018 Echo: EF 10-15%; e. 07/2022 Echo: EF 15-20%, grIII DD.   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 09/2022 Event monitor: predominantly sinus rhythm, avg HR 81 (46-167). 17 runs NSVT (fastest/longest 13 beats x 167). Rare PACs, 17.5% PVC burden.   PVC's (premature ventricular contractions)    a. 09/2022 Event Monitor: 17.5% PVC burden.   Statin intolerance    Tobacco abuse     Medications:  Apixaban  5mg  po BID - last dose 9/14 @ 2134  Assessment: Pateint with PMH including HFrEF with recent EF of 19.2 %, mild PAH, CAD, CKD II, Gout, morbid obesity, NSVT, and tobacco abuse. Patient reports taking Eliquis   PTA received by mail from patient assistance program.(last inpatient dose 9/14 at 2134).  Pharmacy consulted  to transition Apixaban  to heparin  infusion for LV thrombus. Plan cath tomorrow 9/16.  Baseline aPTT and HL ordered. Hgb and plt stable. CrCl > 90  Goal of Therapy:  Heparin  level 0.3-0.7 units/ml aPTT 66-102 seconds Monitor platelets by anticoagulation protocol: Yes   Date Time aPTT Rate/Comment  0915 1013 30 BL 0915 1913 51 Therapeutic x1     Plan:  APTT is therapeutic  Will continue heparin  infusion at 1900 units/hr Check aPTT 6 hours after start of infusion Continue to monitor H&H and platelets  Annabella LOISE Banks, PharmD Clinical Pharmacist 11/27/2023 8:28 PM

## 2023-11-27 NOTE — Telephone Encounter (Signed)
 Patient Product/process development scientist completed.    The patient is insured through [employer].     Ran test claim for Eliquis  5 mg and the current 30 day co-pay is $4.00.  Ran test claim for Entresto  24-26 mg and the current 30 day co-pay is $4.00.  Ran test claim for Jardiance  10mg  and the current 30 day co-pay is $4.00.  Ran test claim for Farxiga 10 mg and Requires Prior Authorization    This test claim was processed through [employer]- copay amounts may vary at other pharmacies due to [employer], or as the patient moves through the different stages of their insurance plan.     Reyes Sharps, CPHT Pharmacy Technician III Certified Patient Advocate [facility] Patient Advocate Team Direct Number: [phone]  Fax: [phone]

## 2023-11-27 NOTE — TOC CM/SW Note (Signed)
 Transition of Care [facility]) - Inpatient Brief Assessment   Patient Details  Name: Philip Monroe MRN: [id number] Date of Birth: [birth year]  Transition of Care [facility]) CM/SW Contact:    Lauraine JAYSON Carpen, LCSW Phone Number: 11/27/2023, 10:34 AM   Clinical Narrative: CSW reviewed chart. No PCP. CSW added list to AVS and put for patient to call his Medicaid worker to inquire about getting set up with a provider. SDOH flag for transportation. Added resources to AVS. No other TOC needs identified at this time. CSW will continue to follow progress. Please place [facility] consult if any needs arise.  Transition of Care Asessment: Insurance and Status: Insurance coverage has been reviewed Patient has primary care physician: No Home environment has been reviewed: Apartment Prior level of function:: Not documented Prior/Current Home Services: No current home services Social Drivers of Health Review: SDOH reviewed interventions complete Readmission risk has been reviewed: Yes Transition of care needs: no transition of care needs at this time

## 2023-11-28 ENCOUNTER — Other Ambulatory Visit: Payer: Self-pay

## 2023-11-28 ENCOUNTER — Encounter
Admission: EM | Disposition: A | Discharge status: Home / Self Care | Payer: Self-pay | Source: Home / Self Care | Attending: Student in an Organized Health Care Education/Training Program

## 2023-11-28 DIAGNOSIS — I513 Intracardiac thrombosis, not elsewhere classified: Secondary | ICD-10-CM

## 2023-11-28 DIAGNOSIS — N189 Chronic kidney disease, unspecified: Secondary | ICD-10-CM

## 2023-11-28 DIAGNOSIS — I2510 Atherosclerotic heart disease of native coronary artery without angina pectoris: Secondary | ICD-10-CM | POA: Diagnosis not present

## 2023-11-28 DIAGNOSIS — I5043 Acute on chronic combined systolic (congestive) and diastolic (congestive) heart failure: Secondary | ICD-10-CM | POA: Diagnosis not present

## 2023-11-28 HISTORY — PX: RIGHT/LEFT HEART CATH AND CORONARY ANGIOGRAPHY: CATH118266

## 2023-11-28 LAB — MRSA NEXT GEN BY PCR, NASAL: MRSA by PCR Next Gen: NOT DETECTED

## 2023-11-28 LAB — COOXEMETRY PANEL
Carboxyhemoglobin: 2.5 % — ABNORMAL HIGH (ref 0.5–1.5)
Methemoglobin: 1.2 % (ref 0.0–1.5)
O2 Saturation: 64.9 %
Total hemoglobin: 13.3 g/dL (ref 12.0–16.0)
Total oxygen content: 62.5 %

## 2023-11-28 LAB — BASIC METABOLIC PANEL WITH GFR
Anion gap: 12 (ref 5–15)
BUN: 26 mg/dL — ABNORMAL HIGH (ref 6–20)
CO2: 27 mmol/L (ref 22–32)
Calcium: 8.6 mg/dL — ABNORMAL LOW (ref 8.9–10.3)
Chloride: 96 mmol/L — ABNORMAL LOW (ref 98–111)
Creatinine, Ser: 1.34 mg/dL — ABNORMAL HIGH (ref 0.61–1.24)
GFR, Estimated: >60 mL/min (ref >60)
Glucose, Bld: 91 mg/dL (ref 70–99)
Potassium: 3.8 mmol/L (ref 3.5–5.1)
Sodium: 135 mmol/L (ref 135–145)

## 2023-11-28 LAB — CBC WITH DIFFERENTIAL/PLATELET
Abs Immature Granulocytes: 0.04 K/uL (ref 0.00–0.07)
Basophils Absolute: 0.1 K/uL (ref 0.0–0.1)
Basophils Relative: 1 %
Eosinophils Absolute: 0.3 K/uL (ref 0.0–0.5)
Eosinophils Relative: 3 %
HCT: 40.1 % (ref 39.0–52.0)
Hemoglobin: 12.6 g/dL — ABNORMAL LOW (ref 13.0–17.0)
Immature Granulocytes: 0 %
Lymphocytes Relative: 41 %
Lymphs Abs: 4.2 K/uL — ABNORMAL HIGH (ref 0.7–4.0)
MCH: 24.8 pg — ABNORMAL LOW (ref 26.0–34.0)
MCHC: 31.4 g/dL (ref 30.0–36.0)
MCV: 78.8 fL — ABNORMAL LOW (ref 80.0–100.0)
Monocytes Absolute: 0.9 K/uL (ref 0.1–1.0)
Monocytes Relative: 9 %
Neutro Abs: 4.6 K/uL (ref 1.7–7.7)
Neutrophils Relative %: 46 %
Platelets: 377 K/uL (ref 150–400)
RBC: 5.09 MIL/uL (ref 4.22–5.81)
RDW: 17.9 % — ABNORMAL HIGH (ref 11.5–15.5)
Smear Review: NORMAL
WBC: 10 K/uL (ref 4.0–10.5)
nRBC: 0 % (ref 0.0–0.2)

## 2023-11-28 LAB — POCT I-STAT 7, (LYTES, BLD GAS, ICA,H+H)
Acid-Base Excess: 0 mmol/L (ref 0.0–2.0)
Bicarbonate: 23.7 mmol/L (ref 20.0–28.0)
Calcium, Ion: 1.13 mmol/L — ABNORMAL LOW (ref 1.15–1.40)
HCT: 41 % (ref 39.0–52.0)
Hemoglobin: 13.9 g/dL (ref 13.0–17.0)
O2 Saturation: 95 %
Potassium: 3.8 mmol/L (ref 3.5–5.1)
Sodium: 136 mmol/L (ref 135–145)
TCO2: 25 mmol/L (ref 22–32)
pCO2 arterial: 36.2 mmHg (ref 32–48)
pH, Arterial: 7.423 (ref 7.35–7.45)
pO2, Arterial: 72 mmHg — ABNORMAL LOW (ref 83–108)

## 2023-11-28 LAB — HEPARIN LEVEL (UNFRACTIONATED): Heparin Unfractionated: 0.64 [IU]/mL (ref 0.30–0.70)

## 2023-11-28 LAB — POCT I-STAT EG7
Acid-Base Excess: 1 mmol/L (ref 0.0–2.0)
Acid-Base Excess: 2 mmol/L (ref 0.0–2.0)
Bicarbonate: 26.4 mmol/L (ref 20.0–28.0)
Bicarbonate: 27.8 mmol/L (ref 20.0–28.0)
Calcium, Ion: 1.1 mmol/L — ABNORMAL LOW (ref 1.15–1.40)
Calcium, Ion: 1.18 mmol/L (ref 1.15–1.40)
HCT: 41 % (ref 39.0–52.0)
HCT: 43 % (ref 39.0–52.0)
Hemoglobin: 13.9 g/dL (ref 13.0–17.0)
Hemoglobin: 14.6 g/dL (ref 13.0–17.0)
O2 Saturation: 49 %
O2 Saturation: 49 %
Potassium: 3.7 mmol/L (ref 3.5–5.1)
Potassium: 4 mmol/L (ref 3.5–5.1)
Sodium: 136 mmol/L (ref 135–145)
Sodium: 138 mmol/L (ref 135–145)
TCO2: 28 mmol/L (ref 22–32)
TCO2: 29 mmol/L (ref 22–32)
pCO2, Ven: 42.8 mmHg — ABNORMAL LOW (ref 44–60)
pCO2, Ven: 44.8 mmHg (ref 44–60)
pH, Ven: 7.399 (ref 7.25–7.43)
pH, Ven: 7.401 (ref 7.25–7.43)
pO2, Ven: 26 mmHg — CL (ref 32–45)
pO2, Ven: 27 mmHg — CL (ref 32–45)

## 2023-11-28 LAB — MAGNESIUM: Magnesium: 2.1 mg/dL (ref 1.7–2.4)

## 2023-11-28 LAB — HEPATIC FUNCTION PANEL
ALT: 24 U/L (ref 0–44)
AST: 23 U/L (ref 15–41)
Albumin: 2.9 g/dL — ABNORMAL LOW (ref 3.5–5.0)
Alkaline Phosphatase: 62 U/L (ref 38–126)
Bilirubin, Direct: 0.4 mg/dL — ABNORMAL HIGH (ref 0.0–0.2)
Indirect Bilirubin: 0.8 mg/dL (ref 0.3–0.9)
Total Bilirubin: 1.2 mg/dL (ref 0.0–1.2)
Total Protein: 6.4 g/dL — ABNORMAL LOW (ref 6.5–8.1)

## 2023-11-28 LAB — APTT
aPTT: 55 s — ABNORMAL HIGH (ref 24–36)
aPTT: 86 s — ABNORMAL HIGH (ref 24–36)

## 2023-11-28 LAB — POTASSIUM: Potassium: 4.3 mmol/L (ref 3.5–5.1)

## 2023-11-28 LAB — LACTIC ACID, PLASMA: Lactic Acid, Venous: 1.6 mmol/L (ref 0.5–1.9)

## 2023-11-28 LAB — GLUCOSE, CAPILLARY: Glucose-Capillary: 186 mg/dL — ABNORMAL HIGH (ref 70–99)

## 2023-11-28 SURGERY — RIGHT/LEFT HEART CATH AND CORONARY ANGIOGRAPHY
Anesthesia: Moderate Sedation

## 2023-11-28 MED ORDER — HEPARIN BOLUS VIA INFUSION
3500.0000 [IU] | Freq: Once | INTRAVENOUS | Status: AC
  Administered 2023-11-28: 3500 [IU] via INTRAVENOUS
  Filled 2023-11-28: qty 3500

## 2023-11-28 MED ORDER — HEPARIN (PORCINE) 25000 UT/250ML-% IV SOLN
2200.0000 [IU]/h | INTRAVENOUS | Status: DC
  Administered 2023-11-28 – 2023-11-29 (×2): 2200 [IU]/h via INTRAVENOUS
  Filled 2023-11-28 (×2): qty 250

## 2023-11-28 MED ORDER — FUROSEMIDE 10 MG/ML IJ SOLN
80.0000 mg | Freq: Once | INTRAMUSCULAR | Status: AC
  Administered 2023-11-28: 80 mg via INTRAVENOUS
  Filled 2023-11-28: qty 8

## 2023-11-28 MED ORDER — HYDRALAZINE HCL 20 MG/ML IJ SOLN: 10.0000 mg | INTRAMUSCULAR | Status: AC | PRN

## 2023-11-28 MED ORDER — HEPARIN (PORCINE) IN NACL 1000-0.9 UT/500ML-% IV SOLN
INTRAVENOUS | Status: AC
  Filled 2023-11-28: qty 1000

## 2023-11-28 MED ORDER — HEPARIN SODIUM (PORCINE) 1000 UNIT/ML IJ SOLN
INTRAMUSCULAR | Status: AC
  Filled 2023-11-28: qty 10

## 2023-11-28 MED ORDER — SODIUM CHLORIDE 0.9% FLUSH
3.0000 mL | Freq: Two times a day (BID) | INTRAVENOUS | Status: DC
  Administered 2023-11-28 – 2023-12-04 (×12): 3 mL via INTRAVENOUS

## 2023-11-28 MED ORDER — LIDOCAINE HCL (PF) 1 % IJ SOLN
INTRAMUSCULAR | Status: DC | PRN
  Administered 2023-11-28 (×2): 5 mL

## 2023-11-28 MED ORDER — FUROSEMIDE 10 MG/ML IJ SOLN: 60.0000 mg | Freq: Once | INTRAMUSCULAR | Status: DC

## 2023-11-28 MED ORDER — SODIUM CHLORIDE 0.9 % IV SOLN: 250.0000 mL | INTRAVENOUS | Status: AC | PRN

## 2023-11-28 MED ORDER — MIDAZOLAM HCL 2 MG/2ML IJ SOLN
INTRAMUSCULAR | Status: DC | PRN
  Administered 2023-11-28: 1 mg via INTRAVENOUS

## 2023-11-28 MED ORDER — FENTANYL CITRATE (PF) 100 MCG/2ML IJ SOLN
INTRAMUSCULAR | Status: DC | PRN
  Administered 2023-11-28: 25 ug via INTRAVENOUS

## 2023-11-28 MED ORDER — HEPARIN SODIUM (PORCINE) 1000 UNIT/ML IJ SOLN
INTRAMUSCULAR | Status: DC | PRN
  Administered 2023-11-28: 6000 [IU] via INTRAVENOUS

## 2023-11-28 MED ORDER — SODIUM CHLORIDE 0.9% FLUSH: 10.0000 mL | INTRAVENOUS | Status: DC | PRN

## 2023-11-28 MED ORDER — CHLORHEXIDINE GLUCONATE CLOTH 2 % EX PADS
6.0000 | MEDICATED_PAD | Freq: Every day | CUTANEOUS | Status: DC
  Administered 2023-11-28 – 2023-12-03 (×6): 6 via TOPICAL

## 2023-11-28 MED ORDER — SODIUM CHLORIDE 0.9% FLUSH: 3.0000 mL | INTRAVENOUS | Status: DC | PRN

## 2023-11-28 MED ORDER — LABETALOL HCL 5 MG/ML IV SOLN: 10.0000 mg | INTRAVENOUS | Status: AC | PRN

## 2023-11-28 MED ORDER — ACETAMINOPHEN 325 MG PO TABS: 650.0000 mg | ORAL_TABLET | ORAL | Status: DC | PRN

## 2023-11-28 MED ORDER — FENTANYL CITRATE (PF) 100 MCG/2ML IJ SOLN
INTRAMUSCULAR | Status: AC
  Filled 2023-11-28: qty 2

## 2023-11-28 MED ORDER — HEPARIN (PORCINE) IN NACL 1000-0.9 UT/500ML-% IV SOLN
INTRAVENOUS | Status: DC | PRN
  Administered 2023-11-28 (×2): 500 mL

## 2023-11-28 MED ORDER — FREE WATER
250.0000 mL | Freq: Once | Status: DC
Start: 2023-11-28 — End: 2023-11-28

## 2023-11-28 MED ORDER — MILRINONE LACTATE IN DEXTROSE 20-5 MG/100ML-% IV SOLN
0.1250 ug/kg/min | INTRAVENOUS | Status: DC
  Administered 2023-11-28 – 2023-11-29 (×4): 0.25 ug/kg/min via INTRAVENOUS
  Administered 2023-11-30 – 2023-12-01 (×2): 0.125 ug/kg/min via INTRAVENOUS
  Filled 2023-11-28 (×6): qty 100

## 2023-11-28 MED ORDER — ONDANSETRON HCL 4 MG/2ML IJ SOLN: 4.0000 mg | Freq: Four times a day (QID) | INTRAMUSCULAR | Status: DC | PRN

## 2023-11-28 MED ORDER — VERAPAMIL HCL 2.5 MG/ML IV SOLN
INTRAVENOUS | Status: AC
  Filled 2023-11-28: qty 2

## 2023-11-28 MED ORDER — AMIODARONE HCL 200 MG PO TABS
200.0000 mg | ORAL_TABLET | Freq: Two times a day (BID) | ORAL | Status: DC
Start: 2023-11-28 — End: 2023-11-28
  Administered 2023-11-28 (×2): 200 mg via ORAL
  Filled 2023-11-28 (×2): qty 1

## 2023-11-28 MED ORDER — VERAPAMIL HCL 2.5 MG/ML IV SOLN
INTRAVENOUS | Status: DC | PRN
  Administered 2023-11-28: 2.5 mg via INTRA_ARTERIAL

## 2023-11-28 MED ORDER — FUROSEMIDE 10 MG/ML IJ SOLN
12.0000 mg/h | INTRAVENOUS | Status: AC
  Administered 2023-11-28 – 2023-11-29 (×2): 12 mg/h via INTRAVENOUS
  Filled 2023-11-28 (×3): qty 20

## 2023-11-28 MED ORDER — POTASSIUM CHLORIDE CRYS ER 20 MEQ PO TBCR
40.0000 meq | EXTENDED_RELEASE_TABLET | Freq: Once | ORAL | Status: AC
  Administered 2023-11-28: 40 meq via ORAL
  Filled 2023-11-28: qty 2

## 2023-11-28 MED ORDER — IOHEXOL 300 MG/ML  SOLN
INTRAMUSCULAR | Status: DC | PRN
  Administered 2023-11-28: 165 mL

## 2023-11-28 MED ORDER — MIDAZOLAM HCL 2 MG/2ML IJ SOLN
INTRAMUSCULAR | Status: AC
  Filled 2023-11-28: qty 2

## 2023-11-28 MED ORDER — LIDOCAINE HCL 1 % IJ SOLN
INTRAMUSCULAR | Status: AC
  Filled 2023-11-28: qty 20

## 2023-11-28 MED ORDER — SODIUM CHLORIDE 0.9 % IV SOLN: INTRAVENOUS | Status: AC

## 2023-11-28 SURGICAL SUPPLY — 13 items
CATH INFINITI 5 FR JL3.5 (CATHETERS) IMPLANT
CATH INFINITI 5F JL4 125CM (CATHETERS) IMPLANT
CATH INFINITI 5FR JL4 (CATHETERS) IMPLANT
CATH INFINITI JR4 5F (CATHETERS) IMPLANT
CATH SWAN GANZ 7F STRAIGHT (CATHETERS) IMPLANT
DEVICE RAD TR BAND REGULAR (VASCULAR PRODUCTS) IMPLANT
DRAPE BRACHIAL (DRAPES) IMPLANT
GLIDESHEATH SLEND SS 6F .021 (SHEATH) IMPLANT
GLIDESHEATH SLENDER 7FR .021G (SHEATH) IMPLANT
GUIDEWIRE INQWIRE 1.5J.035X260 (WIRE) IMPLANT
PACK CARDIAC CATH (CUSTOM PROCEDURE TRAY) ×1 IMPLANT
SET ATX-X65L (MISCELLANEOUS) IMPLANT
STATION PROTECTION PRESSURIZED (MISCELLANEOUS) IMPLANT

## 2023-11-28 NOTE — Progress Notes (Signed)
 Progress Note   [name] FMW:[id number] DOB: 11/26/1975 DOA: 11/24/2023     4 DOS: the patient was seen and examined on 11/28/2023     Brief hospital course: From HPI Philip Monroe is a 48 y.o. male with medical history significant for  HFrEF with recent EF of 19.2 %, mild PAH, CAD, CKD II, Gout, morbid obesity, NSVT, tobacco abuse, and possible history of apical thrombus for which he has been on anticoagulation since 06/19/2018.    The patient has been experiencing increase pain and swelling of his joints recently and has not been taking his diuretics as this makes it difficult for him to get to the toilet. He presents with shortness of breath and increased swelling to lower extremities.   In the ED he is found to have a BNP of 1700, low Na at 131, elevated WBC at 13.5, . He is saturating 97% on room air. CXR demonstrates pulmonary edema.   The patient will be admitted to a telemetry bed. Troponins have been ordered. Echocardiogram will be ordered. He will be diuresed. The patient received 1.2 of colchicine  in the ED. This will be continued as his daily dose.     Assessment and Plan:   Acute on chronic combined systolic and diastolic CHF (congestive heart failure) (HCC) Most recent echo 05/20/2023 demonstrated EF of less than 20% BNP is elevated at 1700.  His BNP was 1500 in July and 204 in March of this year.  Echocardiogram still showing EF less than 20% Continue diuresis Monitor input and output Cardiology consulted and recommends sending patient to the ICU for milrinone  drip   Apical mural thrombus There was a possible apical thrombus identified on TTE from 06/19/2018.  Patient takes Eliquis  at home Currently on heparin  drip given possible plans of cardiac catheterization   Tobacco use disorder Continue nicotine  patch   PVC's (premature ventricular contractions) Continue Mexitil .   Hypertension Continue metoprolol , losartan , and spironolactone  as outpatient.    Hyperlipidemia LDL goal <70 Continue Zetia  as at home.   CKD (chronic kidney disease), stage II Noted. The patient's creatinine on presentation is 1.28. This is not far off of baseline of 1.14. Monitor and hold naproxen  while diuresing.      Advance Care Planning: Full Code   Consults: None   Family Communication: None available    Subjective:  Patient seen and examined at bedside this morning He admits to improvement in respiratory function Still has significant lower extremity swelling   Physical Exam:  Constitutional:  The patient is awake, alert, and oriented x 3. No acute distress.   Respiratory:  No increased work of breathing. Positive for rales. No tactile fremitus Cardiovascular:  Regular rate and rhythm No murmurs, ectopy, or gallups. No lateral PMI. No thrills. Abdomen:  Abdomen is soft, non-tender, non-distended No hernias, masses, or organomegaly Normoactive bowel sounds.  Musculoskeletal:  Positive for 2-3+ pitting edema of the lower extremities Skin:  No rashes, lesions, ulcers palpation of skin: no induration or nodules Neurologic:  CN 2-12 intact     Data Reviewed:    Latest Ref Rng & Units 11/28/2023    1:15 PM 11/28/2023    1:06 PM 11/28/2023    5:51 AM  CBC  WBC 4.0 - 10.5 K/uL   10.0   Hemoglobin 13.0 - 17.0 g/dL 86.0  86.0    85.3  87.3   Hematocrit 39.0 - 52.0 % 41.0  41.0    43.0  40.1   Platelets  150 - 400 K/uL   377        Latest Ref Rng & Units 11/28/2023    1:15 PM 11/28/2023    1:06 PM 11/28/2023    5:51 AM  BMP  Glucose 70 - 99 mg/dL   91   BUN 6 - 20 mg/dL   26   Creatinine 9.38 - 1.24 mg/dL   8.65   Sodium 864 - 854 mmol/L 136  138    136  135   Potassium 3.5 - 5.1 mmol/L 3.8  3.7    4.0  3.8   Chloride 98 - 111 mmol/L   96   CO2 22 - 32 mmol/L   27   Calcium 8.9 - 10.3 mg/dL   8.6      Vitals:   90/83/74 1500 11/28/23 1600 11/28/23 1608 11/28/23 1700  BP: (!) 114/90 120/87 (!) 126/96 (!) 127/99  Pulse: 68 69  67 66  Resp: (!) 24 17 (!) 22 (!) 21  Temp: 97.8 F (36.6 C)     TempSrc: Oral     SpO2: 99% 98% 96% 98%  Weight:      Height: 6' 3 (1.905 m)        Author: Drue ONEIDA Potter, MD 11/28/2023 5:37 PM  For on call review [email or web address].

## 2023-11-28 NOTE — Progress Notes (Addendum)
 RHC/LHC today showed: 1. Severely elevated left- and right-sided filling pressures.  2. 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  3. Low cardiac output by Fick and thermodilution.  4. Severe diffuse CAD.  Occluded proximal PDA and mid AV LCx.  Severe diffuse disease right PLV and 90% proximal ramus.  There is nonobstructive disease in the LAD.    Transfer to ICU.  I am going to start milrinone  0.25 mcg/kg/min with low output HF.  After milrinone  begun, give Lasix  60 mg IV x 1 bolus then Lasix  gtt 12 mg/hr. He is on mexiletine for PVCs.  Place PICC, follow CVP and co-ox.   With markedly low EF and without severe LAD disease, I do not think CABG is a good option.   Philip Monroe 11/28/2023 2:07 PM

## 2023-11-28 NOTE — Consult Note (Signed)
 PHARMACY - ANTICOAGULATION CONSULT NOTE  Pharmacy Consult for Heparin  infusion Indication: LV thrombus  Allergies  Allergen Reactions   Lactose Intolerance (Gi) Diarrhea   Statins     myalgias    Patient Measurements: Height: 6' 3 (190.5 cm) Weight: 133.3 kg (293 lb 14 oz) IBW/kg (Calculated) : 84.5 HEPARIN  DW (KG): 115.2  Vital Signs: Temp: 97.8 F (36.6 C) (09/16 1500) Temp Source: Oral (09/16 1500) BP: 126/96 (09/16 1608) Pulse Rate: 67 (09/16 1608)  Labs: Recent Labs    11/26/23 0425 11/27/23 0318 11/27/23 1013 11/27/23 1013 11/27/23 1913 11/28/23 0106 11/28/23 0550 11/28/23 0551 11/28/23 1306 11/28/23 1315  HGB 12.7* 12.5*  --   --   --   --   --  12.6* 14.6  13.9 13.9  HCT 40.4 40.9  --   --   --   --   --  40.1 43.0  41.0 41.0  PLT 381 392  --   --   --   --   --  377  --   --   APTT  --   --  30   < > 51* 55* 86*  --   --   --   HEPARINUNFRC  --   --  1.02*  --   --   --  0.64  --   --   --   CREATININE 1.22 1.49*  --   --   --   --   --  1.34*  --   --    < > = values in this interval not displayed.    Estimated Creatinine Clearance: 100.2 mL/min (A) (by C-G formula based on SCr of 1.34 mg/dL (H)).   Medical History: Past Medical History:  Diagnosis Date   Aortic root dilatation (HCC)    a. 07/2022 Echo: Ao root 4.4cm, Asc Ao 4.0cm.   CAD (coronary artery disease)    a. 08/2017 Cath: LM nl, LAD 47m/d, D1 70ost, LCX 175m, OM1 90 inf branch, 60 sup branch, OM2 small, RCA 30 diffuse, RPDA 100 w/ L->R collats; b. 07/2022 Echo: LM nl, LAD 85m, D1 80, LCX 170m, OM1 80, RCA 83m, RPDA 100-->No targets for PCI/CABG-->Med rx.   CHF (congestive heart failure) (HCC)    takes lasix    Chronic HFrEF (heart failure with reduced ejection fraction) (HCC)    a. 03/2015 Echo: EF 35-40%; b. 08/2017 Echo: EF 15%; c. 08/2017 cMRI: EF 35%; d. 06/2018 Echo: EF 10-15%; e. 07/2022 Echo: EF 15-20%, grIII DD, mild MR, sev dil LA, mod-sev PH, Ao root 4.4cm, Asc Ao 4.0cm; f. 07/2022  RHC: RA 4, RV 38/3, PA 43/18 (26), PCWP 11. CO/CI 5.3/1.8.   CKD (chronic kidney disease), stage II    Gout    Hyperlipidemia LDL goal <70    Hypertension    Ischemic cardiomyopathy    a. 03/2015 Echo: EF 35-40%; b. 08/2017 Echo: EF 15%; c. 08/2017 cMRI: EF 35%; d. 06/2018 Echo: EF 10-15%; e. 07/2022 Echo: EF 15-20%, grIII DD.   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 09/2022 Event monitor: predominantly sinus rhythm, avg HR 81 (46-167). 17 runs NSVT (fastest/longest 13 beats x 167). Rare PACs, 17.5% PVC burden.   PVC's (premature ventricular contractions)    a. 09/2022 Event Monitor: 17.5% PVC burden.   Statin intolerance    Tobacco abuse  Medications:  Apixaban  5mg  po BID - last dose 9/14 @ 2134  Assessment: Pateint with PMH including HFrEF with recent EF of 19.2 %, mild PAH, CAD, CKD II, Gout, morbid obesity, NSVT, and tobacco abuse. Patient reports taking Eliquis   PTA received by mail from patient assistance program.(last inpatient dose 9/14 at 2134).  Pharmacy consulted to transition Apixaban  to heparin  infusion for LV thrombus. Plan cath tomorrow 9/16.  Baseline aPTT and HL dose. Hgb and plt stable. CrCl > 90  Goal of Therapy:  Heparin  level 0.3-0.7 units/ml aPTT 66-102 seconds Monitor platelets by anticoagulation protocol: Yes   Date Time aPTT Rate/Comment  0915 1013 30 HL 1.02- Baseline 0915 1913 51 subtherapeutic 0916 0106 55 Subtherapeutic 0916 0550 86 Therapeutic x 1 (level done early), HL 0.64     Plan:  Patient is s/p RIGHT/LEFT HEART CATH AND CORONARY ANGIOGRAPHY  Restart heparin  infusion at 2200 units/hr @ 1830, 2-hours after radial band removal Check HL in 6 hours   Continue to monitor H&H and platelets  Lum Mania, PharmD, BCPS 11/28/2023 4:18 PM

## 2023-11-28 NOTE — H&P (View-Only) (Signed)
 Advanced Heart Failure Rounding Note  Cardiologist: None  Chief Complaint: CHF Subjective:    I/Os net negative 1571, weight down 3 lbs.  Creatinine stable at 1.34.  Feels like edema is gradually improving.   Gouty pain seems improved on prednisone .    Objective:   Weight Range: 133.3 kg Body mass index is 36.73 kg/m.   Vital Signs:   Temp:  [97.7 F (36.5 C)-98.7 F (37.1 C)] 97.7 F (36.5 C) (09/16 0804) Pulse Rate:  [67-85] 69 (09/16 0804) Resp:  [18-20] 18 (09/16 0617) BP: (106-117)/(71-90) 108/71 (09/16 0804) SpO2:  [91 %-100 %] 100 % (09/16 0804) Weight:  [133.3 kg] 133.3 kg (09/16 0500)    Weight change: Filed Weights   11/26/23 0500 11/27/23 0500 11/28/23 0500  Weight: (!) 137 kg 134.3 kg 133.3 kg    Intake/Output:   Intake/Output Summary (Last 24 hours) at 11/28/2023 0825 Last data filed at 11/28/2023 0700 Gross per 24 hour  Intake 2054.06 ml  Output 3700 ml  Net -1645.94 ml      Physical Exam    General:  Well appearing. No resp difficulty HEENT: Normal Neck: Supple. JVP 8-9 cm. Carotids 2+ bilat; no bruits. No lymphadenopathy or thyromegaly appreciated. Cor: PMI nondisplaced. Regular rate & rhythm. No rubs, gallops or murmurs. Lungs: Clear Abdomen: Soft, nontender, nondistended. No hepatosplenomegaly. No bruits or masses. Good bowel sounds. Extremities: No cyanosis, clubbing, rash. 1+ ankle edema.  Neuro: Alert & orientedx3, cranial nerves grossly intact. moves all 4 extremities w/o difficulty. Affect pleasant   Telemetry   NSR, personally reviewed   Labs    CBC Recent Labs    11/27/23 0318 11/28/23 0551  WBC 9.5 10.0  NEUTROABS 5.2 4.6  HGB 12.5* 12.6*  HCT 40.9 40.1  MCV 80.0 78.8*  PLT 392 377   Basic Metabolic Panel Recent Labs    90/84/74 0318 11/28/23 0551  NA 135 135  K 3.7 3.8  CL 97* 96*  CO2 28 27  GLUCOSE 109* 91  BUN 28* 26*  CREATININE 1.49* 1.34*  CALCIUM 8.5* 8.6*   Liver Function Tests No  results for input(s): AST, ALT, ALKPHOS, BILITOT, PROT, ALBUMIN in the last 72 hours. No results for input(s): LIPASE, AMYLASE in the last 72 hours. Cardiac Enzymes No results for input(s): CKTOTAL, CKMB, CKMBINDEX, TROPONINI in the last 72 hours.  BNP: BNP (last 3 results) Recent Labs    05/26/23 1048 09/25/23 0958 11/24/23 1618  BNP 204.2* 1,509.6* 1,700.4*    ProBNP (last 3 results) No results for input(s): PROBNP in the last 8760 hours.   D-Dimer No results for input(s): DDIMER in the last 72 hours. Hemoglobin A1C No results for input(s): HGBA1C in the last 72 hours. Fasting Lipid Panel No results for input(s): CHOL, HDL, LDLCALC, TRIG, CHOLHDL, LDLDIRECT in the last 72 hours. Thyroid Function Tests No results for input(s): TSH, T4TOTAL, T3FREE, THYROIDAB in the last 72 hours.  Invalid input(s): FREET3  Other results:   Imaging    No results found.   Medications:     Scheduled Medications:  allopurinol   100 mg Oral Daily   colchicine   0.6 mg Oral Daily   digoxin   0.125 mg Oral Daily   empagliflozin   10 mg Oral Daily   ezetimibe   10 mg Oral QHS   furosemide   60 mg Intravenous Q12H   losartan   25 mg Oral Daily   metoprolol  succinate  25 mg Oral Daily   mexiletine  300 mg Oral BID  predniSONE   40 mg Oral Q breakfast   sodium chloride  flush  3 mL Intravenous Q12H   spironolactone   25 mg Oral Daily    Infusions:  heparin  2,200 Units/hr (11/28/23 0400)    PRN Medications: acetaminophen , HYDROcodone -acetaminophen , ondansetron  (ZOFRAN ) IV, sodium chloride  flush   Assessment/Plan   1. 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  RHC 05/24 showed normal LV and RV filling pressures, mild pulmonary hypertension with mild elevation of PVR, low cardiac index 1.8.  Echo 03/25 with EF <20%, LV with GHK, LV severely dilated,  RV mod reduced, LA severely dilated, mild-mod MR.  Echo this admission with EF < 20%, severe LV dilation, moderate RV enlargement with low normal systolic function, moderate MR, dilated IVC.  PVCs may also contribute, 20.8% on Zio monitor in 5/25.  He had run out of most medications and for the last few weeks was only taking Eliquis , torsemide , and Jardiance .  The week prior to admission, he only took torsemide  twice due to gout pain and difficulty getting to the bathroom.  He is still volume overloaded on exam still though he seems to be diuresing well, I/Os net negative 1571 and weight is coming down (3 lbs lower today). No chest pain, doubt ACS (HS-TnI mildly elevated with no trend).  Creatinine 1.49 => 1.34. SBP 100s-110s.   - Continue Lasix  60 mg IV bid today, adjust based on RHC.   - Continue digoxin  with suspected low output HF.  - Continue losartan  25 mg daily, will plan to transition to Entresto  after cath (likely tomorrow).  - spironolactone  25 mg daily restarted.  - Toprol  XL 25 mg daily restarted.  - Continue Jardiance  10 mg daily.  - Would be reasonable to get cardiac MRI to confirm noncompaction diagnosis (after diuresis and cath).  - With weight and smoking, not a candidate for heart transplant.  - With consistently depressed EF, he is an ICD candidate. QRS likely not wide enough to benefit from CRT.  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  CO was low on RHC in 5/24.  Noncompliance with medications complicates situation. With elevated creatinine and prior low output, I am concerned that he may need milrinone  gtt to achieve full diuresis. LHC/RHC today to assess filling pressures, cardiac output, and coronaries.  We discussed risks/benefits and he agrees to procedure.  Though compliance is a concern, I think that he would be a potential LVAD candidate.  2. CAD:  LHC 5/24 at [facility] showed severe multivessel CAD including 80% mid RCA, 100% RPDA, 100% mid LCx, 80% OM1, 80%  mid-distal LAD. Medical management recommended by [facility] cardiology 05/24. No chest pain.  Mild elevation in HS-TnI with no trend this admission is likely due to demand ischemia from volume overload.  - No ASA given Eliquis  use and stable CAD.  - Continue Zetia , has not been able to tolerate statin.  Would refer to lipid clinic for Repatha as outpatient.  - As above, will plan coronary angiography today.  Will reassess coronaries prior to possible LVAD consideration.  CABG may be a consideration as well but the markedly low EF makes this very high risk. Will need to limit contrast with elevated creatinine.  3. PVCs: Zio 5/25 with 21% burden.  He did not tolerate amiodarone  well and was started on mexiletine. He has not been taking mexiletine recently (out of med x week).  - Restarted mexiletine 300 mg bid  this admission.  - Restarted low dose Toprol  XL (not taking at home).   - Will need sleep study as outpatient.  4. Morbid obesity: GLP-1 agonist would be ideal as outpatient.  5. Tobacco use: Smoking about 1/2 ppd.  6. 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  - On heparin  gtt for cath then back to Eliquis .  7. Gout: Very symptomatic, knees and ankles.  Has been on allopurinol  and colchicine .  Had not been taking torsemide  regularly at home so that he did not have to walk to the bathroom with gout pain.  - Added prednisone  burst for ongoing gouty pain, pain improved.     Length of Stay: 4  Ezra Shuck, MD  11/28/2023, 8:25 AM  Advanced Heart Failure Team Pager [phone] (M-F; 7a - 5p)  Please contact [facility] for night-coverage after hours (5p -7a ) and weekends on [email or web address]

## 2023-11-28 NOTE — Consult Note (Signed)
 NAME:  Philip Monroe, MRN:  [id number], DOB:  1976-01-08, LOS: 4 ADMISSION DATE:  11/24/2023, CONSULTATION DATE:  11/28/2023 REFERRING MD:  Ezra Shuck, MD, CHIEF COMPLAINT:  Heart Failure   History of Present Illness:   The patient is a 48 year old male with past medical history of heart failure and coronary artery disease as well as CKD who presents to the hospital with increased shortness of breath.    Patient presented on 9/12 complaining of worsening shortness of breath of 2 weeks in duration with associated lower extremity swelling.  He also reports knee and ankle pain. He denies having any chest pain and denies having any infectious symptoms.  On presentation to the ED, blood work was notable for a BNP of 1700 prompting admission to the hospital for IV diuresis.  Patient admitted to the hospitalist service and initiated on IV furosemide  and cardiology was consulted. TTE showed marked drop in EF and LV dilation, worse from prior. He was seen by Dr. Shuck on 9/15 in consultation given his complex history of cardiomyopathy with low cardiac output and cardiac index and multiple hospitalizations for decompensated heart failure.  It appears that the patient is not compliant with all his medications.  Given all this, he underwent right and left heart cath today and is admitted to the ICU for ionotropic support.  -RHC 08/2017: RA 6, RV 49/7, PA 50/25 (34), PCWP 19, CO/CI 7.6/2.9, SVR 1158 dynes, PVR 1.9 wood, LVEDP 22 mmHG -RHC 07/2022: RA 4, RV 38/3, PA 43/18 (26), PCWP 11, CO/CI 5.3/1.8, PVR 2.83 wood -LHC 07/2022: multivessel disease with 80% occluded mid RCA, 100% occluded RPDA, 100% occluded mid left circumflex, 80% occluded OM1, and 80% occluded mid distal LAD.  -RHC 11/28/2022: RA 17, RV 80/25, PA 83/53 (59), PCWP 27, CO/CI 4.13/1.6, PVR 7.7 wood  -LHC 11/28/2023: mid LAD 40%, distal LAD 40%, first Diag 70%, Ramus 90%, Lcx 100%, RCA 60%, RPDA 100% with collaterals from LAD, RPAV1 80% and RPAV 2  90%  Pertinent  Medical History  -HFrEF (EF 20%) -Ischemic cardiomyopathy -noncompaction cardiomyopathy -CKD - Multivessel CAD -gout -morbid obesity -tobacco abuse -history of apical mural thrombus   Objective    Blood pressure 129/78, pulse (!) 49, temperature 97.6 F (36.4 C), temperature source Oral, resp. rate (!) 25, height 6' 3 (1.905 m), weight 133.3 kg, SpO2 95%.        Intake/Output Summary (Last 24 hours) at 11/28/2023 1408 Last data filed at 11/28/2023 1028 Gross per 24 hour  Intake 1814.06 ml  Output 2500 ml  Net -685.94 ml   Filed Weights   11/26/23 0500 11/27/23 0500 11/28/23 0500  Weight: (!) 137 kg 134.3 kg 133.3 kg    Examination: Physical Exam Constitutional:      Appearance: Normal appearance.  Cardiovascular:     Rate and Rhythm: Normal rate and regular rhythm.     Pulses: Normal pulses.     Heart sounds: Normal heart sounds.  Pulmonary:     Effort: Pulmonary effort is normal.     Breath sounds: Normal breath sounds. No wheezing or rales.  Musculoskeletal:     Right lower leg: Edema present.     Left lower leg: Edema present.  Neurological:     General: No focal deficit present.     Mental Status: He is alert and oriented to person, place, and time. Mental status is at baseline.     Assessment and Plan   #Acute Decompensated HFrEF (ACC/AHA D/NYHA III) #CAD #Morbid  Obesity #Apical Mural Thrombus #HTN #CKD  48 year old male with ischemic and non-ischemic cardiomyopathy presenting with acute decompensated heart failure. He underwent RHC today showing advanced heart failure with markedly decreased CO/CI.  Neuro - appears at baseline, not in pain. Tylenol  and hydrocodone  added for pain. He is also on prednisone  for management of gout.  CV - decompensated heart failure due to ischemic and non-ischemic cardiomyopathy, with current numbers showing decompensation. He has severely elevated filling pressures and markedly reduced CO/CI.  Followed closely by advanced heart failure and will have ionotropic support initiated. We will closely monitor his central venous saturation after central access is placed, and titrate milrinone  accordingly. He is on GDMT with digoxin , empagliflozin , ezetimibe , losartan  (plan to transition to entresto ), and spironolactone . He is on labetalol , amiodarone , and mexiletine for control of his SVT's. He is also maintained on IV diuresis with furosemide .  -milrinone  for ionotropic support  -place CVC  -trend co-ox  Pulm - on room air, though CXR on presentation showed mild vascular congestion. Will continue to monitor.  GI - check LFT's as could develop congestive hepatopathy. Liberalize diet.  Renal -  History of CKD, presenting with creatinine near baseline. Cardiorenal syndrome a possibility given advanced heart failure state. His kidney function today is close to baseline. We will monitor for any kidney injury with administration of contrast earlier.  Endo - ICU glycemic protocol. He is on a short burst of prednisone  for his gout.  Hem/Onc - on apixaban  at home for history of apical mural thrombus. Switched to IV heparin  while in patient.  ID - no sign of active infection. Monitor fever curve.   Labs   CBC: Recent Labs  Lab 11/24/23 1611 11/26/23 0425 11/27/23 0318 11/28/23 0551 11/28/23 1306 11/28/23 1315  WBC 13.5* 10.7* 9.5 10.0  --   --   NEUTROABS  --  5.7 5.2 4.6  --   --   HGB 12.8* 12.7* 12.5* 12.6* 14.6  13.9 13.9  HCT 40.3 40.4 40.9 40.1 43.0  41.0 41.0  MCV 78.6* 78.6* 80.0 78.8*  --   --   PLT 355 381 392 377  --   --     Basic Metabolic Panel: Recent Labs  Lab 11/24/23 1611 11/25/23 0427 11/26/23 0425 11/27/23 0318 11/28/23 0551 11/28/23 1306 11/28/23 1315  NA 131* 132* 133* 135 135 136  138 136  K 3.9 3.6 3.9 3.7 3.8 4.0  3.7 3.8  CL 99 99 96* 97* 96*  --   --   CO2 22 24 27 28 27   --   --   GLUCOSE 114* 93 88 109* 91  --   --   BUN 22* 25* 27* 28*  26*  --   --   CREATININE 1.28* 1.36* 1.22 1.49* 1.34*  --   --   CALCIUM 8.6* 8.6* 8.7* 8.5* 8.6*  --   --    GFR: Estimated Creatinine Clearance: 100.2 mL/min (A) (by C-G formula based on SCr of 1.34 mg/dL (H)). Recent Labs  Lab 11/24/23 1611 11/26/23 0425 11/27/23 0318 11/28/23 0551  WBC 13.5* 10.7* 9.5 10.0    Liver Function Tests: Recent Labs  Lab 11/24/23 1618  AST 23  ALT 20  ALKPHOS 64  BILITOT 1.8*  PROT 6.9  ALBUMIN 3.1*   No results for input(s): LIPASE, AMYLASE in the last 168 hours. No results for input(s): AMMONIA in the last 168 hours.  ABG    Component Value Date/Time   PHART  7.423 11/28/2023 1315   PCO2ART 36.2 11/28/2023 1315   PO2ART 72 (L) 11/28/2023 1315   HCO3 23.7 11/28/2023 1315   TCO2 25 11/28/2023 1315   O2SAT 95 11/28/2023 1315     Coagulation Profile: No results for input(s): INR, PROTIME in the last 168 hours.  Cardiac Enzymes: No results for input(s): CKTOTAL, CKMB, CKMBINDEX, TROPONINI in the last 168 hours.  HbA1C: No results found for: HGBA1C  CBG: No results for input(s): GLUCAP in the last 168 hours.  Review of Systems:   N/A  Past Medical History:  He,  has a past medical history of Aortic root dilatation (HCC), CAD (coronary artery disease), CHF (congestive heart failure) (HCC), Chronic HFrEF (heart failure with reduced ejection fraction) (HCC), CKD (chronic kidney disease), stage II, Gout, Hyperlipidemia LDL goal <70, Hypertension, Ischemic cardiomyopathy, IVCD (intraventricular conduction defect), Left ventricular noncompaction (HCC), Morbid obesity (HCC), NSVT (nonsustained ventricular tachycardia) (HCC), PVC's (premature ventricular contractions), Statin intolerance, and Tobacco abuse.   Surgical History:  History reviewed. No pertinent surgical history.   Social History:   reports that he has been smoking cigarettes. He has never used smokeless tobacco. He reports that he does not currently  use alcohol after a past usage of about 36.0 standard drinks of alcohol per week. He reports that he does not use drugs.   Family History:  His family history is not on file.   Allergies Allergies  Allergen Reactions   Lactose Intolerance (Gi) Diarrhea   Statins     myalgias     Home Medications  Prior to Admission medications   Medication Sig Start Date End Date Taking? Authorizing Provider  colchicine  0.6 MG tablet Take 1 tablet (0.6 mg total) by mouth 2 (two) times daily. 09/26/23  Yes Caleen Qualia, MD  allopurinol  (ZYLOPRIM ) 100 MG tablet Take 1 tablet (100 mg total) by mouth daily. Patient not taking: Reported on 11/24/2023 09/14/23 12/13/23  Viviann Pastor, MD  aspirin  81 MG chewable tablet Chew 1 tablet (81 mg total) by mouth daily. Patient not taking: Reported on 11/24/2023 05/24/23   Amin, Sumayya, MD  cyanocobalamin  (VITAMIN B12) 1000 MCG tablet Take 1 tablet (1,000 mcg total) by mouth daily. Patient not taking: Reported on 11/24/2023 05/24/23   Caleen Qualia, MD  ELIQUIS  5 MG TABS tablet Take 1 tablet (5 mg total) by mouth 2 (two) times daily. Patient not taking: Reported on 11/24/2023 09/14/23 12/13/23  Viviann Pastor, MD  empagliflozin  (JARDIANCE ) 10 MG TABS tablet Take 1 tablet (10 mg total) by mouth daily. Patient not taking: Reported on 11/24/2023 09/14/23 12/13/23  Viviann Pastor, MD  ezetimibe  (ZETIA ) 10 MG tablet Take 1 tablet (10 mg total) by mouth at bedtime. Patient not taking: Reported on 11/24/2023 09/14/23 12/13/23  Viviann Pastor, MD  HYDROcodone -acetaminophen  (NORCO/VICODIN) 5-325 MG tablet Take 1 tablet by mouth every 4 (four) hours as needed. Patient not taking: Reported on 11/24/2023 06/21/23   Margrette Monte A, PA-C  losartan  (COZAAR ) 25 MG tablet Take 1 tablet (25 mg total) by mouth daily. Patient not taking: Reported on 11/24/2023 06/22/23   Donette Ellouise LABOR, FNP  metoprolol  succinate (TOPROL -XL) 25 MG 24 hr tablet Take 1 tablet (25 mg total) by mouth  daily. Patient not taking: Reported on 11/24/2023 07/04/23   Bensimhon, Toribio SAUNDERS, MD  mexiletine (MEXITIL ) 150 MG capsule Take 2 capsules (300 mg total) by mouth 2 (two) times daily. Patient not taking: Reported on 11/24/2023 08/08/23   Bensimhon, Toribio SAUNDERS, MD  Multiple Vitamin (MULTIVITAMIN WITH  MINERALS) TABS tablet Take 1 tablet by mouth daily. Patient not taking: Reported on 11/24/2023 05/24/23   Amin, Sumayya, MD  naproxen  (NAPROSYN ) 500 MG tablet Take 1 tablet (500 mg total) by mouth 2 (two) times daily with a meal. Patient not taking: Reported on 11/24/2023 09/14/23   Viviann Pastor, MD  spironolactone  (ALDACTONE ) 25 MG tablet Take 1 tablet (25 mg total) by mouth daily. Patient not taking: Reported on 11/24/2023 09/14/23 12/13/23  Viviann Pastor, MD  torsemide  (DEMADEX ) 20 MG tablet Take 40 mg by mouth daily. Patient not taking: Reported on 11/24/2023 09/26/23   [provider]     The patient is critically ill due to decompensated heart failure.  Critical care was necessary to treat or prevent imminent or life-threatening deterioration. Critical care time was spent by me on the following activities: development of a treatment plan with the patient and/or surrogate as well as nursing, discussions with consultants, evaluation of the patient's response to treatment, examination of the patient, obtaining a history from the patient or surrogate, ordering and performing treatments and interventions, ordering and review of laboratory studies, ordering and review of radiographic studies, review of telemetry data including pulse oximetry, re-evaluation of patient's condition and participation in multidisciplinary rounds.   I personally spent 36 minutes providing critical care not including any separately billable procedures.   Belva November, MD [facility] 11/28/2023 4:00 PM

## 2023-11-28 NOTE — Plan of Care (Signed)

## 2023-11-28 NOTE — Consult Note (Signed)
 PHARMACY - ANTICOAGULATION CONSULT NOTE  Pharmacy Consult for Heparin  infusion Indication: LV thrombus  Allergies  Allergen Reactions   Lactose Intolerance (Gi) Diarrhea   Statins     myalgias    Patient Measurements: Height: 6' 3 (190.5 cm) Weight: 133.3 kg (293 lb 14 oz) IBW/kg (Calculated) : 84.5 HEPARIN  DW (KG): 115.2  Vital Signs: Temp: 97.7 F (36.5 C) (09/16 0804) BP: 108/71 (09/16 0804) Pulse Rate: 69 (09/16 0804)  Labs: Recent Labs    11/26/23 0425 11/27/23 0318 11/27/23 1013 11/27/23 1013 11/27/23 1913 11/28/23 0106 11/28/23 0550 11/28/23 0551  HGB 12.7* 12.5*  --   --   --   --   --  12.6*  HCT 40.4 40.9  --   --   --   --   --  40.1  PLT 381 392  --   --   --   --   --  377  APTT  --   --  30   < > 51* 55* 86*  --   HEPARINUNFRC  --   --  1.02*  --   --   --  0.64  --   CREATININE 1.22 1.49*  --   --   --   --   --  1.34*   < > = values in this interval not displayed.    Estimated Creatinine Clearance: 100.2 mL/min (A) (by C-G formula based on SCr of 1.34 mg/dL (H)).   Medical History: Past Medical History:  Diagnosis Date   Aortic root dilatation (HCC)    a. 07/2022 Echo: Ao root 4.4cm, Asc Ao 4.0cm.   CAD (coronary artery disease)    a. 08/2017 Cath: LM nl, LAD 69m/d, D1 70ost, LCX 148m, OM1 90 inf branch, 60 sup branch, OM2 small, RCA 30 diffuse, RPDA 100 w/ L->R collats; b. 07/2022 Echo: LM nl, LAD 23m, D1 80, LCX 130m, OM1 80, RCA 23m, RPDA 100-->No targets for PCI/CABG-->Med rx.   CHF (congestive heart failure) (HCC)    takes lasix    Chronic HFrEF (heart failure with reduced ejection fraction) (HCC)    a. 03/2015 Echo: EF 35-40%; b. 08/2017 Echo: EF 15%; c. 08/2017 cMRI: EF 35%; d. 06/2018 Echo: EF 10-15%; e. 07/2022 Echo: EF 15-20%, grIII DD, mild MR, sev dil LA, mod-sev PH, Ao root 4.4cm, Asc Ao 4.0cm; f. 07/2022 RHC: RA 4, RV 38/3, PA 43/18 (26), PCWP 11. CO/CI 5.3/1.8.   CKD (chronic kidney disease), stage II    Gout    Hyperlipidemia LDL goal  <70    Hypertension    Ischemic cardiomyopathy    a. 03/2015 Echo: EF 35-40%; b. 08/2017 Echo: EF 15%; c. 08/2017 cMRI: EF 35%; d. 06/2018 Echo: EF 10-15%; e. 07/2022 Echo: EF 15-20%, grIII DD.   IVCD (intraventricular conduction defect)    Left ventricular noncompaction [facility])    Morbid obesity (HCC)    NSVT (nonsustained ventricular tachycardia) (HCC)    a. 09/2022 Event monitor: predominantly sinus rhythm, avg HR 81 (46-167). 17 runs NSVT (fastest/longest 13 beats x 167). Rare PACs, 17.5% PVC burden.   PVC's (premature ventricular contractions)    a. 09/2022 Event Monitor: 17.5% PVC burden.   Statin intolerance    Tobacco abuse     Medications:  Apixaban  5mg  po BID - last dose 9/14 @ 2134  Assessment: Pateint with PMH including HFrEF with recent EF of 19.2 %, mild PAH, CAD, CKD II, Gout, morbid obesity, NSVT, and tobacco abuse. Patient reports taking Eliquis   PTA received by mail from patient assistance program.(last inpatient dose 9/14 at 2134).  Pharmacy consulted to transition Apixaban  to heparin  infusion for LV thrombus. Plan cath tomorrow 9/16.  Baseline aPTT and HL dose. Hgb and plt stable. CrCl > 90  Goal of Therapy:  Heparin  level 0.3-0.7 units/ml aPTT 66-102 seconds Monitor platelets by anticoagulation protocol: Yes   Date Time aPTT Rate/Comment  0915 1013 30 HL 1.02- Baseline 0915 1913 51 subtherapeutic 0916 0106 55 Subtherapeutic 0916 0550 86 Therapeutic x 1 (level done early), HL 0.64     Plan:  aPTT 86, therapeutic x 1 and correlating with HL. Continue heparin  infusion at 2200 units/hr Recheck HL in 6 hours to confirm therapeutic rate  Continue to monitor H&H and platelets  Brevyn Ring Rodriguez-Guzman PharmD, BCPS 11/28/2023 8:29 AM

## 2023-11-28 NOTE — Interval H&P Note (Signed)
 History and Physical Interval Note:  11/28/2023 12:50 PM  Philip Monroe  has presented today for surgery, with the diagnosis of CHF.  The various methods of treatment have been discussed with the patient and family. After consideration of risks, benefits and other options for treatment, the patient has consented to  Procedure(s): RIGHT/LEFT HEART CATH AND CORONARY ANGIOGRAPHY (N/A) as a surgical intervention.  The patient's history has been reviewed, patient examined, no change in status, stable for surgery.  I have reviewed the patient's chart and labs.  Questions were answered to the patient's satisfaction.     Arta Stump [employer]

## 2023-11-28 NOTE — Consult Note (Signed)
 PHARMACY - ANTICOAGULATION CONSULT NOTE  Pharmacy Consult for Heparin  infusion Indication: LV thrombus  Allergies  Allergen Reactions   Lactose Intolerance (Gi) Diarrhea   Statins     myalgias    Patient Measurements: Height: 6' 3 (190.5 cm) Weight: 134.3 kg (296 lb 1.2 oz) IBW/kg (Calculated) : 84.5 HEPARIN  DW (KG): 115.2  Vital Signs: Temp: 98.2 F (36.8 C) (09/16 0011) Temp Source: Oral (09/15 1427) BP: 106/90 (09/16 0011) Pulse Rate: 69 (09/16 0011)  Labs: Recent Labs    11/25/23 0427 11/26/23 0425 11/27/23 0318 11/27/23 1013 11/27/23 1913 11/28/23 0106  HGB  --  12.7* 12.5*  --   --   --   HCT  --  40.4 40.9  --   --   --   PLT  --  381 392  --   --   --   APTT  --   --   --  30 51* 55*  HEPARINUNFRC  --   --   --  1.02*  --   --   CREATININE 1.36* 1.22 1.49*  --   --   --     Estimated Creatinine Clearance: 90.5 mL/min (A) (by C-G formula based on SCr of 1.49 mg/dL (H)).   Medical History: Past Medical History:  Diagnosis Date   Aortic root dilatation (HCC)    a. 07/2022 Echo: Ao root 4.4cm, Asc Ao 4.0cm.   CAD (coronary artery disease)    a. 08/2017 Cath: LM nl, LAD 48m/d, D1 70ost, LCX 136m, OM1 90 inf branch, 60 sup branch, OM2 small, RCA 30 diffuse, RPDA 100 w/ L->R collats; b. 07/2022 Echo: LM nl, LAD 48m, D1 80, LCX 179m, OM1 80, RCA 66m, RPDA 100-->No targets for PCI/CABG-->Med rx.   CHF (congestive heart failure) (HCC)    takes lasix    Chronic HFrEF (heart failure with reduced ejection fraction) (HCC)    a. 03/2015 Echo: EF 35-40%; b. 08/2017 Echo: EF 15%; c. 08/2017 cMRI: EF 35%; d. 06/2018 Echo: EF 10-15%; e. 07/2022 Echo: EF 15-20%, grIII DD, mild MR, sev dil LA, mod-sev PH, Ao root 4.4cm, Asc Ao 4.0cm; f. 07/2022 RHC: RA 4, RV 38/3, PA 43/18 (26), PCWP 11. CO/CI 5.3/1.8.   CKD (chronic kidney disease), stage II    Gout    Hyperlipidemia LDL goal <70    Hypertension    Ischemic cardiomyopathy    a. 03/2015 Echo: EF 35-40%; b. 08/2017 Echo: EF 15%;  c. 08/2017 cMRI: EF 35%; d. 06/2018 Echo: EF 10-15%; e. 07/2022 Echo: EF 15-20%, grIII DD.   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 09/2022 Event monitor: predominantly sinus rhythm, avg HR 81 (46-167). 17 runs NSVT (fastest/longest 13 beats x 167). Rare PACs, 17.5% PVC burden.   PVC's (premature ventricular contractions)    a. 09/2022 Event Monitor: 17.5% PVC burden.   Statin intolerance    Tobacco abuse     Medications:  Apixaban  5mg  po BID - last dose 9/14 @ 2134  Assessment: Pateint with PMH including HFrEF with recent EF of 19.2 %, mild PAH, CAD, CKD II, Gout, morbid obesity, NSVT, and tobacco abuse. Patient reports taking Eliquis   PTA received by mail from patient assistance program.(last inpatient dose 9/14 at 2134).  Pharmacy consulted to transition Apixaban  to heparin  infusion for LV thrombus. Plan cath tomorrow 9/16.  Baseline aPTT and HL ordered. Hgb  and plt stable. CrCl > 90  Goal of Therapy:  Heparin  level 0.3-0.7 units/ml aPTT 66-102 seconds Monitor platelets by anticoagulation protocol: Yes   Date Time aPTT Rate/Comment  0915 1013 30 BL 0915 1913 51 0916 0106 55 subtherapeutic      Plan:  Bolus 3500 units x 1  Will increase heparin  infusion to 2200 units/hr Recheck aPTT 6 hours after rate change Continue to monitor H&H and platelets  Rankin CANDIE Dills, PharmD, [facility] 11/28/2023 1:49 AM

## 2023-11-28 NOTE — Progress Notes (Addendum)
 Peripherally Inserted Central Catheter Placement  The IV Nurse has discussed with the patient and/or persons authorized to consent for the patient, the purpose of this procedure and the potential benefits and risks involved with this procedure.  The benefits include less needle sticks, lab draws from the catheter, and the patient may be discharged home with the catheter. Risks include, but not limited to, infection, bleeding, blood clot (thrombus formation), and puncture of an artery; nerve damage and irregular heartbeat and possibility to perform a PICC exchange if needed/ordered by physician.  Alternatives to this procedure were also discussed.  Bard Power PICC patient education guide, fact sheet on infection prevention and patient information card has been provided to patient /or left at bedside.    PICC Placement Documentation  PICC Triple Lumen 11/28/23 Left Basilic 48 cm 0 cm (Active)  Indication for Insertion or Continuance of Line Vasoactive infusions 11/28/23 1657  Exposed Catheter (cm) 0 cm 11/28/23 1657  Site Assessment Clean, Dry, Intact 11/28/23 1657  Lumen #1 Status Flushed;Saline locked;Blood return noted 11/28/23 1657  Lumen #2 Status Flushed;Saline locked;Blood return noted 11/28/23 1657  Lumen #3 Status Flushed;Saline locked;Blood return noted 11/28/23 1657  Dressing Type Transparent;Securing device 11/28/23 1657  Dressing Status Antimicrobial disc/dressing in place;Clean, Dry, Intact 11/28/23 1657  Line Care Connections checked and tightened 11/28/23 1657  Line Adjustment (NICU/IV Team Only) No 11/28/23 1657  Dressing Intervention New dressing;Adhesive placed at insertion site (IV team only) 11/28/23 1657  Dressing Change Due 12/05/23 11/28/23 1657    ECG Site Rite result printed and placed in chart.    Philip Monroe 11/28/2023, 4:58 PM

## 2023-11-28 NOTE — Plan of Care (Signed)
  Problem: Education: Goal: Knowledge of General Education information will improve Description: Including pain rating scale, medication(s)/side effects and non-pharmacologic comfort measures Outcome: Progressing   Problem: Clinical Measurements: Goal: Cardiovascular complication will be avoided Outcome: Progressing   Problem: Activity: Goal: Risk for activity intolerance will decrease Outcome: Progressing   Problem: Elimination: Goal: Will not experience complications related to bowel motility Outcome: Progressing   Problem: Pain Managment: Goal: General experience of comfort will improve and/or be controlled Outcome: Progressing

## 2023-11-28 NOTE — Progress Notes (Signed)
 Advanced Heart Failure Rounding Note  Cardiologist: None  Chief Complaint: CHF Subjective:    I/Os net negative 1571, weight down 3 lbs.  Creatinine stable at 1.34.  Feels like edema is gradually improving.   Gouty pain seems improved on prednisone .    Objective:   Weight Range: 133.3 kg Body mass index is 36.73 kg/m.   Vital Signs:   Temp:  [97.7 F (36.5 C)-98.7 F (37.1 C)] 97.7 F (36.5 C) (09/16 0804) Pulse Rate:  [67-85] 69 (09/16 0804) Resp:  [18-20] 18 (09/16 0617) BP: (106-117)/(71-90) 108/71 (09/16 0804) SpO2:  [91 %-100 %] 100 % (09/16 0804) Weight:  [133.3 kg] 133.3 kg (09/16 0500)    Weight change: Filed Weights   11/26/23 0500 11/27/23 0500 11/28/23 0500  Weight: (!) 137 kg 134.3 kg 133.3 kg    Intake/Output:   Intake/Output Summary (Last 24 hours) at 11/28/2023 0825 Last data filed at 11/28/2023 0700 Gross per 24 hour  Intake 2054.06 ml  Output 3700 ml  Net -1645.94 ml      Physical Exam    General:  Well appearing. No resp difficulty HEENT: Normal Neck: Supple. JVP 8-9 cm. Carotids 2+ bilat; no bruits. No lymphadenopathy or thyromegaly appreciated. Cor: PMI nondisplaced. Regular rate & rhythm. No rubs, gallops or murmurs. Lungs: Clear Abdomen: Soft, nontender, nondistended. No hepatosplenomegaly. No bruits or masses. Good bowel sounds. Extremities: No cyanosis, clubbing, rash. 1+ ankle edema.  Neuro: Alert & orientedx3, cranial nerves grossly intact. moves all 4 extremities w/o difficulty. Affect pleasant   Telemetry   NSR, personally reviewed   Labs    CBC Recent Labs    11/27/23 0318 11/28/23 0551  WBC 9.5 10.0  NEUTROABS 5.2 4.6  HGB 12.5* 12.6*  HCT 40.9 40.1  MCV 80.0 78.8*  PLT 392 377   Basic Metabolic Panel Recent Labs    90/84/74 0318 11/28/23 0551  NA 135 135  K 3.7 3.8  CL 97* 96*  CO2 28 27  GLUCOSE 109* 91  BUN 28* 26*  CREATININE 1.49* 1.34*  CALCIUM 8.5* 8.6*   Liver Function Tests No  results for input(s): AST, ALT, ALKPHOS, BILITOT, PROT, ALBUMIN in the last 72 hours. No results for input(s): LIPASE, AMYLASE in the last 72 hours. Cardiac Enzymes No results for input(s): CKTOTAL, CKMB, CKMBINDEX, TROPONINI in the last 72 hours.  BNP: BNP (last 3 results) Recent Labs    05/26/23 1048 09/25/23 0958 11/24/23 1618  BNP 204.2* 1,509.6* 1,700.4*    ProBNP (last 3 results) No results for input(s): PROBNP in the last 8760 hours.   D-Dimer No results for input(s): DDIMER in the last 72 hours. Hemoglobin A1C No results for input(s): HGBA1C in the last 72 hours. Fasting Lipid Panel No results for input(s): CHOL, HDL, LDLCALC, TRIG, CHOLHDL, LDLDIRECT in the last 72 hours. Thyroid Function Tests No results for input(s): TSH, T4TOTAL, T3FREE, THYROIDAB in the last 72 hours.  Invalid input(s): FREET3  Other results:   Imaging    No results found.   Medications:     Scheduled Medications:  allopurinol   100 mg Oral Daily   colchicine   0.6 mg Oral Daily   digoxin   0.125 mg Oral Daily   empagliflozin   10 mg Oral Daily   ezetimibe   10 mg Oral QHS   furosemide   60 mg Intravenous Q12H   losartan   25 mg Oral Daily   metoprolol  succinate  25 mg Oral Daily   mexiletine  300 mg Oral BID  predniSONE   40 mg Oral Q breakfast   sodium chloride  flush  3 mL Intravenous Q12H   spironolactone   25 mg Oral Daily    Infusions:  heparin  2,200 Units/hr (11/28/23 0400)    PRN Medications: acetaminophen , HYDROcodone -acetaminophen , ondansetron  (ZOFRAN ) IV, sodium chloride  flush   Assessment/Plan   1. 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  RHC 05/24 showed normal LV and RV filling pressures, mild pulmonary hypertension with mild elevation of PVR, low cardiac index 1.8.  Echo 03/25 with EF <20%, LV with GHK, LV severely dilated,  RV mod reduced, LA severely dilated, mild-mod MR.  Echo this admission with EF < 20%, severe LV dilation, moderate RV enlargement with low normal systolic function, moderate MR, dilated IVC.  PVCs may also contribute, 20.8% on Zio monitor in 5/25.  He had run out of most medications and for the last few weeks was only taking Eliquis , torsemide , and Jardiance .  The week prior to admission, he only took torsemide  twice due to gout pain and difficulty getting to the bathroom.  He is still volume overloaded on exam still though he seems to be diuresing well, I/Os net negative 1571 and weight is coming down (3 lbs lower today). No chest pain, doubt ACS (HS-TnI mildly elevated with no trend).  Creatinine 1.49 => 1.34. SBP 100s-110s.   - Continue Lasix  60 mg IV bid today, adjust based on RHC.   - Continue digoxin  with suspected low output HF.  - Continue losartan  25 mg daily, will plan to transition to Entresto  after cath (likely tomorrow).  - spironolactone  25 mg daily restarted.  - Toprol  XL 25 mg daily restarted.  - Continue Jardiance  10 mg daily.  - Would be reasonable to get cardiac MRI to confirm noncompaction diagnosis (after diuresis and cath).  - With weight and smoking, not a candidate for heart transplant.  - With consistently depressed EF, he is an ICD candidate. QRS likely not wide enough to benefit from CRT.  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  CO was low on RHC in 5/24.  Noncompliance with medications complicates situation. With elevated creatinine and prior low output, I am concerned that he may need milrinone  gtt to achieve full diuresis. LHC/RHC today to assess filling pressures, cardiac output, and coronaries.  We discussed risks/benefits and he agrees to procedure.  Though compliance is a concern, I think that he would be a potential LVAD candidate.  2. CAD:  LHC 5/24 at [facility] showed severe multivessel CAD including 80% mid RCA, 100% RPDA, 100% mid LCx, 80% OM1, 80%  mid-distal LAD. Medical management recommended by [facility] cardiology 05/24. No chest pain.  Mild elevation in HS-TnI with no trend this admission is likely due to demand ischemia from volume overload.  - No ASA given Eliquis  use and stable CAD.  - Continue Zetia , has not been able to tolerate statin.  Would refer to lipid clinic for Repatha as outpatient.  - As above, will plan coronary angiography today.  Will reassess coronaries prior to possible LVAD consideration.  CABG may be a consideration as well but the markedly low EF makes this very high risk. Will need to limit contrast with elevated creatinine.  3. PVCs: Zio 5/25 with 21% burden.  He did not tolerate amiodarone  well and was started on mexiletine. He has not been taking mexiletine recently (out of med x week).  - Restarted mexiletine 300 mg bid  this admission.  - Restarted low dose Toprol  XL (not taking at home).   - Will need sleep study as outpatient.  4. Morbid obesity: GLP-1 agonist would be ideal as outpatient.  5. Tobacco use: Smoking about 1/2 ppd.  6. 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  - On heparin  gtt for cath then back to Eliquis .  7. Gout: Very symptomatic, knees and ankles.  Has been on allopurinol  and colchicine .  Had not been taking torsemide  regularly at home so that he did not have to walk to the bathroom with gout pain.  - Added prednisone  burst for ongoing gouty pain, pain improved.     Length of Stay: 4  Ezra Shuck, MD  11/28/2023, 8:25 AM  Advanced Heart Failure Team Pager [phone] (M-F; 7a - 5p)  Please contact [facility] for night-coverage after hours (5p -7a ) and weekends on [email or web address]

## 2023-11-29 ENCOUNTER — Encounter: Payer: Self-pay | Admitting: Cardiology

## 2023-11-29 DIAGNOSIS — I5023 Acute on chronic systolic (congestive) heart failure: Secondary | ICD-10-CM

## 2023-11-29 DIAGNOSIS — I2510 Atherosclerotic heart disease of native coronary artery without angina pectoris: Secondary | ICD-10-CM | POA: Diagnosis not present

## 2023-11-29 DIAGNOSIS — I493 Ventricular premature depolarization: Secondary | ICD-10-CM | POA: Diagnosis not present

## 2023-11-29 LAB — CBC
HCT: 41.7 % (ref 39.0–52.0)
Hemoglobin: 13.2 g/dL (ref 13.0–17.0)
MCH: 24.8 pg — ABNORMAL LOW (ref 26.0–34.0)
MCHC: 31.7 g/dL (ref 30.0–36.0)
MCV: 78.4 fL — ABNORMAL LOW (ref 80.0–100.0)
Platelets: 404 K/uL — ABNORMAL HIGH (ref 150–400)
RBC: 5.32 MIL/uL (ref 4.22–5.81)
RDW: 18.1 % — ABNORMAL HIGH (ref 11.5–15.5)
WBC: 12.5 K/uL — ABNORMAL HIGH (ref 4.0–10.5)
nRBC: 0 % (ref 0.0–0.2)

## 2023-11-29 LAB — BASIC METABOLIC PANEL WITH GFR
Anion gap: 11 (ref 5–15)
Anion gap: 10 (ref 5–15)
BUN: 21 mg/dL — ABNORMAL HIGH (ref 6–20)
BUN: 24 mg/dL — ABNORMAL HIGH (ref 6–20)
CO2: 25 mmol/L (ref 22–32)
CO2: 24 mmol/L (ref 22–32)
Calcium: 8.6 mg/dL — ABNORMAL LOW (ref 8.9–10.3)
Calcium: 9.2 mg/dL (ref 8.9–10.3)
Chloride: 96 mmol/L — ABNORMAL LOW (ref 98–111)
Chloride: 98 mmol/L (ref 98–111)
Creatinine, Ser: 1.2 mg/dL (ref 0.61–1.24)
Creatinine, Ser: 1.48 mg/dL — ABNORMAL HIGH (ref 0.61–1.24)
GFR, Estimated: >60 mL/min (ref >60)
GFR, Estimated: 58 mL/min — ABNORMAL LOW (ref >60)
Glucose, Bld: 207 mg/dL — ABNORMAL HIGH (ref 70–99)
Glucose, Bld: 118 mg/dL — ABNORMAL HIGH (ref 70–99)
Potassium: 3.5 mmol/L (ref 3.5–5.1)
Potassium: 4.9 mmol/L (ref 3.5–5.1)
Sodium: 132 mmol/L — ABNORMAL LOW (ref 135–145)
Sodium: 132 mmol/L — ABNORMAL LOW (ref 135–145)

## 2023-11-29 LAB — PHOSPHORUS: Phosphorus: 2.9 mg/dL (ref 2.5–4.6)

## 2023-11-29 LAB — MAGNESIUM: Magnesium: 2.1 mg/dL (ref 1.7–2.4)

## 2023-11-29 LAB — HEPARIN LEVEL (UNFRACTIONATED)
Heparin Unfractionated: 0.39 [IU]/mL (ref 0.30–0.70)
Heparin Unfractionated: 0.5 [IU]/mL (ref 0.30–0.70)

## 2023-11-29 LAB — GLUCOSE, CAPILLARY
Glucose-Capillary: 123 mg/dL — ABNORMAL HIGH (ref 70–99)
Glucose-Capillary: 151 mg/dL — ABNORMAL HIGH (ref 70–99)
Glucose-Capillary: 115 mg/dL — ABNORMAL HIGH (ref 70–99)

## 2023-11-29 LAB — COOXEMETRY PANEL
Carboxyhemoglobin: 2.4 % — ABNORMAL HIGH (ref 0.5–1.5)
Methemoglobin: <0.7 % (ref 0.0–1.5)
O2 Saturation: 77.5 %
Total hemoglobin: 14.5 g/dL (ref 12.0–16.0)
Total oxygen content: 75.5 %

## 2023-11-29 LAB — HEMOGLOBIN A1C
Hgb A1c MFr Bld: 6.6 % — ABNORMAL HIGH (ref 4.8–5.6)
Mean Plasma Glucose: 142.72 mg/dL

## 2023-11-29 MED ORDER — ADULT MULTIVITAMIN W/MINERALS CH
1.0000 | ORAL_TABLET | Freq: Every day | ORAL | Status: DC
  Administered 2023-12-01 – 2023-12-04 (×4): 1 via ORAL
  Filled 2023-11-29 (×4): qty 1

## 2023-11-29 MED ORDER — SACUBITRIL-VALSARTAN 24-26 MG PO TABS
1.0000 | ORAL_TABLET | Freq: Two times a day (BID) | ORAL | Status: DC
  Administered 2023-11-29 – 2023-11-30 (×3): 1 via ORAL
  Filled 2023-11-29 (×3): qty 1

## 2023-11-29 MED ORDER — POTASSIUM CHLORIDE CRYS ER 20 MEQ PO TBCR
40.0000 meq | EXTENDED_RELEASE_TABLET | ORAL | Status: AC
  Administered 2023-11-29 (×3): 40 meq via ORAL
  Filled 2023-11-29 (×3): qty 2

## 2023-11-29 MED ORDER — POTASSIUM CHLORIDE CRYS ER 20 MEQ PO TBCR: 40.0000 meq | EXTENDED_RELEASE_TABLET | Freq: Once | ORAL | Status: DC

## 2023-11-29 MED ORDER — INSULIN ASPART 100 UNIT/ML IJ SOLN
0.0000 [IU] | Freq: Three times a day (TID) | INTRAMUSCULAR | Status: DC
  Administered 2023-11-29: 1 [IU] via SUBCUTANEOUS
  Administered 2023-11-29: 2 [IU] via SUBCUTANEOUS
  Administered 2023-11-30: 3 [IU] via SUBCUTANEOUS
  Administered 2023-12-01 (×2): 1 [IU] via SUBCUTANEOUS
  Administered 2023-12-02: 2 [IU] via SUBCUTANEOUS
  Administered 2023-12-02: 1 [IU] via SUBCUTANEOUS
  Filled 2023-11-29 (×7): qty 1

## 2023-11-29 MED ORDER — APIXABAN 5 MG PO TABS
5.0000 mg | ORAL_TABLET | Freq: Two times a day (BID) | ORAL | Status: DC
Start: 2023-11-29 — End: 2023-12-04
  Administered 2023-11-29 – 2023-12-04 (×10): 5 mg via ORAL
  Filled 2023-11-29 (×10): qty 1

## 2023-11-29 MED ORDER — INSULIN ASPART 100 UNIT/ML IJ SOLN: 0.0000 [IU] | Freq: Every day | INTRAMUSCULAR | Status: DC

## 2023-11-29 NOTE — Progress Notes (Signed)
 PHARMACY CONSULT NOTE - ELECTROLYTES  Pharmacy Consult for Electrolyte Monitoring and Replacement   Recent Labs: Height: 6' 3 (190.5 cm) Weight: (!) 146.5 kg (322 lb 15.6 oz) IBW/kg (Calculated) : 84.5 Estimated Creatinine Clearance: 117.6 mL/min (by C-G formula based on SCr of 1.2 mg/dL). Potassium (mmol/L)  Date Value  11/29/2023 3.5   Magnesium (mg/dL)  Date Value  90/82/7974 2.1   Calcium (mg/dL)  Date Value  90/82/7974 8.6 (L)   Albumin (g/dL)  Date Value  90/83/7974 2.9 (L)   Phosphorus (mg/dL)  Date Value  90/82/7974 2.9   Sodium (mmol/L)  Date Value  11/29/2023 132 (L)    Assessment  Philip Monroe is a 48 y.o. male presenting with acute exacerbation of CHF. 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 Pharmacy has been consulted to monitor and replace electrolytes.  Diet: heart MIVF:  Pertinent medications: lasix  gtt 12 mg/hr   Goal of Therapy:  K >4, Mg > 2 Other electrolytes WNL  9/17: K 3.5, Mg 2.1, Phos 2.9  Plan:  Kcl 40 mEq PO q4h x 3 ordered by heart failure RPh  Check BMP, Mg, Phos with AM labs  Thank you for allowing pharmacy to be a part of this patient's care.   Ransom Blanch PGY-1 Pharmacy Resident  [place] [facility]  11/29/2023 10:35 AM

## 2023-11-29 NOTE — Progress Notes (Signed)
 NAME:  Philip Monroe, MRN:  [id number], DOB:  31-Aug-1975, LOS: 5 ADMISSION DATE:  11/24/2023, CONSULTATION DATE: 11/28/23  History of Present Illness:  The patient is a 48 year old male with past medical history of heart failure and coronary artery disease as well as CKD who presents to the hospital with increased shortness of breath.     Patient presented on 9/12 complaining of worsening shortness of breath of 2 weeks in duration with associated lower extremity swelling.  He also reports knee and ankle pain. He denies having any chest pain and denies having any infectious symptoms.  On presentation to the ED, blood work was notable for a BNP of 1700 prompting admission to the hospital for IV diuresis.   Patient admitted to the hospitalist service and initiated on IV furosemide  and cardiology was consulted. TTE showed marked drop in EF and LV dilation, worse from prior. He was seen by Dr. Rolan on 9/15 in consultation given his complex history of cardiomyopathy with low cardiac output and cardiac index and multiple hospitalizations for decompensated heart failure.  It appears that the patient is not compliant with all his medications.  Given all this, he underwent right and left heart cath today and is admitted to the ICU for ionotropic support.   -RHC 08/2017: RA 6, RV 49/7, PA 50/25 (34), PCWP 19, CO/CI 7.6/2.9, SVR 1158 dynes, PVR 1.9 wood, LVEDP 22 mmHG -RHC 07/2022: RA 4, RV 38/3, PA 43/18 (26), PCWP 11, CO/CI 5.3/1.8, PVR 2.83 wood -LHC 07/2022: multivessel disease with 80% occluded mid RCA, 100% occluded RPDA, 100% occluded mid left circumflex, 80% occluded OM1, and 80% occluded mid distal LAD.   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 Gout Morbid obesity Tobacco abuse History of apical mural thrombus  Significant Hospital Events: Including procedures, antibiotic start and stop dates in addition to other pertinent events   09/12: Admitted to the telemetry unit with acute on chronic combined systolic diastolic CHF and apical mural thrombus  09/16: RHC/LHC revealed severely elevated left and right-sided filling pressures, 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  Pt transferred to ICU for milrinone  and lasix  gtts.  09/17: Pt remains on furosemide  gtt @12  mg/hr and milrinone  gtt @0 .25 mcg/kg/min.  Overnight UOP 2.8L and since admission pt -8.9L.    Interim History / Subjective:  No acute events overnight.    Objective    Blood pressure 121/89, pulse 76, temperature 98.1 F (36.7 C), temperature source Oral, resp. rate (!) 26, height 6' 3 (1.905 m), weight (!) 146.5 kg, SpO2 94%. CVP:  [7 mmHg-10 mmHg] 10 mmHg      Intake/Output Summary (Last 24 hours) at 11/29/2023 0909 Last data filed at 11/29/2023 0400 Gross per 24 hour  Intake 887.12 ml  Output 4000 ml  Net -3112.88 ml   Filed Weights   11/27/23 0500 11/28/23 0500 11/29/23 0600  Weight: 134.3 kg 133.3 kg (!) 146.5 kg    Examination: General: Well-developed-well nourished male, NAD  HENT: Supple, mild JVD  Lungs: Faint  rhonchi throughout, even, non labored  Cardiovascular: NSR, s1s2, no r/g, 2+ radial/1+ distal pulses, trace bilateral lower extremity edema  Abdomen: +BS x4, obese, non distended, non tender  Extremities: Normal bulk and tone, moves all extremities  Neuro: Alert and oriented, follows commands, PERRLA  GU: Voiding   Resolved problem list   Assessment and Plan   #Acute Decompensated HFrEF (ACC/AHA  D/NYHA III) #CAD #Morbid Obesity #Apical Mural Thrombus #HTN - Continuous telemetry monitoring  - Heart failure team consulted appreciate input: lasix  and milrinone  gtts per recommendations  - Fluid restrictions  - Heparin  gtt  - Continue entresto , mexitil , and aldactone   - Follow cooxemetry panel daily  - Maintain map >65  #CKD stage II  #Mild hyponatremia  - Trend BMP  - Replace electrolytes as indicated  - Strict I&O's - Avoid nephrotoxic agents as able   #Hyperglycemia  - CBGs ac/hs  - SSI  - Hemoglobin A1c pending  - Target CBG readings 140 to 180 while in ICU  - Follow hypo/hyperglycemic protocol   Labs   CBC: Recent Labs  Lab 11/24/23 1611 11/26/23 0425 11/27/23 0318 11/28/23 0551 11/28/23 1306 11/28/23 1315 11/29/23 0445  WBC 13.5* 10.7* 9.5 10.0  --   --  12.5*  NEUTROABS  --  5.7 5.2 4.6  --   --   --   HGB 12.8* 12.7* 12.5* 12.6* 14.6  13.9 13.9 13.2  HCT 40.3 40.4 40.9 40.1 43.0  41.0 41.0 41.7  MCV 78.6* 78.6* 80.0 78.8*  --   --  78.4*  PLT 355 381 392 377  --   --  404*    Basic Metabolic Panel: Recent Labs  Lab 11/25/23 0427 11/26/23 0425 11/27/23 0318 11/28/23 0551 11/28/23 1306 11/28/23 1315 11/28/23 2131 11/29/23 0445  NA 132* 133* 135 135 136  138 136  --  132*  K 3.6 3.9 3.7 3.8 4.0  3.7 3.8 4.3 3.5  CL 99 96* 97* 96*  --   --   --  96*  CO2 24 27 28 27   --   --   --  25  GLUCOSE 93 88 109* 91  --   --   --  207*  BUN 25* 27* 28* 26*  --   --   --  21*  CREATININE 1.36* 1.22 1.49* 1.34*  --   --   --  1.20  CALCIUM 8.6* 8.7* 8.5* 8.6*  --   --   --  8.6*  MG  --   --   --   --   --   --  2.1 2.1  PHOS  --   --   --   --   --   --   --  2.9   GFR: Estimated Creatinine Clearance: 117.6 mL/min (by C-G formula based on SCr of 1.2 mg/dL). Recent Labs  Lab 11/26/23 0425 11/27/23 0318 11/28/23 0551 11/28/23 1722 11/29/23 0445  WBC 10.7* 9.5 10.0  --  12.5*  LATICACIDVEN  --   --   --  1.6  --     Liver Function  Tests: Recent Labs  Lab 11/24/23 1618 11/28/23 0550  AST 23 23  ALT 20 24  ALKPHOS 64 62  BILITOT 1.8* 1.2  PROT 6.9 6.4*  ALBUMIN 3.1* 2.9*   No results for input(s): LIPASE, AMYLASE in the last 168 hours. No results for input(s): AMMONIA in the last 168 hours.  ABG    Component Value Date/Time  PHART 7.423 11/28/2023 1315   PCO2ART 36.2 11/28/2023 1315   PO2ART 72 (L) 11/28/2023 1315   HCO3 23.7 11/28/2023 1315   TCO2 25 11/28/2023 1315   O2SAT 77.5 11/29/2023 0816     Coagulation Profile: No results for input(s): INR, PROTIME in the last 168 hours.  Cardiac Enzymes: No results for input(s): CKTOTAL, CKMB, CKMBINDEX, TROPONINI in the last 168 hours.  HbA1C: No results found for: HGBA1C  CBG: Recent Labs  Lab 11/28/23 1549  GLUCAP 186*    Review of Systems: Positives in BOLD   Gen: Denies fever, chills, weight change, fatigue, night sweats HEENT: Denies blurred vision, double vision, hearing loss, tinnitus, sinus congestion, rhinorrhea, sore throat, neck stiffness, dysphagia PULM: Denies shortness of breath, cough, sputum production, hemoptysis, wheezing CV: Denies chest pain, edema, orthopnea, paroxysmal nocturnal dyspnea, palpitations GI: Denies abdominal pain, nausea, vomiting, diarrhea, hematochezia, melena, constipation, change in bowel habits GU: Denies dysuria, hematuria, polyuria, oliguria, urethral discharge Endocrine: Denies hot or cold intolerance, polyuria, polyphagia or appetite change Derm: Denies rash, dry skin, scaling or peeling skin change Heme: Denies easy bruising, bleeding, bleeding gums Neuro: Denies headache, numbness, weakness, slurred speech, loss of memory or consciousness   Past Medical History:  He,  has a past medical history of Aortic root dilatation (HCC), CAD (coronary artery disease), CHF (congestive heart failure) (HCC), Chronic HFrEF (heart failure with reduced ejection fraction) (HCC), CKD (chronic kidney  disease), stage II, Gout, Hyperlipidemia LDL goal <70, Hypertension, Ischemic cardiomyopathy, IVCD (intraventricular conduction defect), Left ventricular noncompaction (HCC), Morbid obesity (HCC), NSVT (nonsustained ventricular tachycardia) (HCC), PVC's (premature ventricular contractions), Statin intolerance, and Tobacco abuse.   Surgical History:   Past Surgical History:  Procedure Laterality Date   RIGHT/LEFT HEART CATH AND CORONARY ANGIOGRAPHY N/A 11/28/2023   Procedure: RIGHT/LEFT HEART CATH AND CORONARY ANGIOGRAPHY;  Surgeon: Rolan Ezra RAMAN, MD;  Location: [facility] INVASIVE CV LAB;  Service: Cardiovascular;  Laterality: N/A;     Social History:   reports that he has been smoking cigarettes. He has never used smokeless tobacco. He reports that he does not currently use alcohol after a past usage of about 36.0 standard drinks of alcohol per week. He reports that he does not use drugs.   Family History:  His family history is not on file.   Allergies Allergies  Allergen Reactions   Lactose Intolerance (Gi) Diarrhea   Statins     myalgias     Home Medications  Prior to Admission medications   Medication Sig Start Date End Date Taking? Authorizing Provider  colchicine  0.6 MG tablet Take 1 tablet (0.6 mg total) by mouth 2 (two) times daily. 09/26/23  Yes Caleen Qualia, MD  allopurinol  (ZYLOPRIM ) 100 MG tablet Take 1 tablet (100 mg total) by mouth daily. Patient not taking: Reported on 11/24/2023 09/14/23 12/13/23  Viviann Pastor, MD  aspirin  81 MG chewable tablet Chew 1 tablet (81 mg total) by mouth daily. Patient not taking: Reported on 11/24/2023 05/24/23   Amin, Sumayya, MD  cyanocobalamin  (VITAMIN B12) 1000 MCG tablet Take 1 tablet (1,000 mcg total) by mouth daily. Patient not taking: Reported on 11/24/2023 05/24/23   Amin, Sumayya, MD  ELIQUIS  5 MG TABS tablet Take 1 tablet (5 mg total) by mouth 2 (two) times daily. Patient not taking: Reported on 11/24/2023 09/14/23 12/13/23  Viviann Pastor, MD  empagliflozin  (JARDIANCE ) 10 MG TABS tablet Take 1 tablet (10 mg total) by mouth daily. Patient not taking: Reported on 11/24/2023 09/14/23 12/13/23  Viviann Pastor,  MD  ezetimibe  (ZETIA ) 10 MG tablet Take 1 tablet (10 mg total) by mouth at bedtime. Patient not taking: Reported on 11/24/2023 09/14/23 12/13/23  Viviann Pastor, MD  HYDROcodone -acetaminophen  (NORCO/VICODIN) 5-325 MG tablet Take 1 tablet by mouth every 4 (four) hours as needed. Patient not taking: Reported on 11/24/2023 06/21/23   Margrette Monte A, PA-C  losartan  (COZAAR ) 25 MG tablet Take 1 tablet (25 mg total) by mouth daily. Patient not taking: Reported on 11/24/2023 06/22/23   Donette Ellouise LABOR, FNP  metoprolol  succinate (TOPROL -XL) 25 MG 24 hr tablet Take 1 tablet (25 mg total) by mouth daily. Patient not taking: Reported on 11/24/2023 07/04/23   Bensimhon, Toribio SAUNDERS, MD  mexiletine (MEXITIL ) 150 MG capsule Take 2 capsules (300 mg total) by mouth 2 (two) times daily. Patient not taking: Reported on 11/24/2023 08/08/23   Bensimhon, Toribio SAUNDERS, MD  Multiple Vitamin (MULTIVITAMIN WITH MINERALS) TABS tablet Take 1 tablet by mouth daily. Patient not taking: Reported on 11/24/2023 05/24/23   Amin, Sumayya, MD  naproxen  (NAPROSYN ) 500 MG tablet Take 1 tablet (500 mg total) by mouth 2 (two) times daily with a meal. Patient not taking: Reported on 11/24/2023 09/14/23   Viviann Pastor, MD  spironolactone  (ALDACTONE ) 25 MG tablet Take 1 tablet (25 mg total) by mouth daily. Patient not taking: Reported on 11/24/2023 09/14/23 12/13/23  Viviann Pastor, MD  torsemide  (DEMADEX ) 20 MG tablet Take 40 mg by mouth daily. Patient not taking: Reported on 11/24/2023 09/26/23   [provider]     Critical care time: 35 minutes     Lonell Moose, AGNP  Pulmonary/Critical Care Pager [phone] (please enter 7 digits) PCCM Consult Pager [phone] (please enter 7 digits)

## 2023-11-29 NOTE — Consult Note (Signed)
 PHARMACY - ANTICOAGULATION NOTE  Pharmacy Consult for Heparin  infusion Indication: LV thrombus  Allergies  Allergen Reactions   Lactose Intolerance (Gi) Diarrhea   Statins     myalgias    Patient Measurements: Height: 6' 3 (190.5 cm) Weight: (!) 146.5 kg (322 lb 15.6 oz) IBW/kg (Calculated) : 84.5 HEPARIN  DW (KG): 115.2  Vital Signs: Temp: 97.8 F (36.6 C) (09/17 1200) Temp Source: Oral (09/17 1200) BP: 129/80 (09/17 1200) Pulse Rate: 76 (09/17 1200)  Labs: Recent Labs    11/27/23 0318 11/27/23 1013 11/27/23 1913 11/28/23 0106 11/28/23 0550 11/28/23 0551 11/28/23 1306 11/28/23 1315 11/29/23 0031 11/29/23 0445 11/29/23 0815  HGB 12.5*  --   --   --   --  12.6* 14.6  13.9 13.9  --  13.2  --   HCT 40.9  --   --   --   --  40.1 43.0  41.0 41.0  --  41.7  --   PLT 392  --   --   --   --  377  --   --   --  404*  --   APTT  --    < > 51* 55* 86*  --   --   --   --   --   --   HEPARINUNFRC  --    < >  --   --  0.64  --   --   --  0.39  --  0.50  CREATININE 1.49*  --   --   --   --  1.34*  --   --   --  1.20  --    < > = values in this interval not displayed.    Estimated Creatinine Clearance: 117.6 mL/min (by C-G formula based on SCr of 1.2 mg/dL).   Medical History: Past Medical History:  Diagnosis Date   Aortic root dilatation (HCC)    a. 07/2022 Echo: Ao root 4.4cm, Asc Ao 4.0cm.   CAD (coronary artery disease)    a. 08/2017 Cath: LM nl, LAD 37m/d, D1 70ost, LCX 114m, OM1 90 inf branch, 60 sup branch, OM2 small, RCA 30 diffuse, RPDA 100 w/ L->R collats; b. 07/2022 Echo: LM nl, LAD 31m, D1 80, LCX 171m, OM1 80, RCA 32m, RPDA 100-->No targets for PCI/CABG-->Med rx.   CHF (congestive heart failure) (HCC)    takes lasix    Chronic HFrEF (heart failure with reduced ejection fraction) (HCC)    a. 03/2015 Echo: EF 35-40%; b. 08/2017 Echo: EF 15%; c. 08/2017 cMRI: EF 35%; d. 06/2018 Echo: EF 10-15%; e. 07/2022 Echo: EF 15-20%, grIII DD, mild MR, sev dil LA, mod-sev PH, Ao  root 4.4cm, Asc Ao 4.0cm; f. 07/2022 RHC: RA 4, RV 38/3, PA 43/18 (26), PCWP 11. CO/CI 5.3/1.8.   CKD (chronic kidney disease), stage II    Gout    Hyperlipidemia LDL goal <70    Hypertension    Ischemic cardiomyopathy    a. 03/2015 Echo: EF 35-40%; b. 08/2017 Echo: EF 15%; c. 08/2017 cMRI: EF 35%; d. 06/2018 Echo: EF 10-15%; e. 07/2022 Echo: EF 15-20%, grIII DD.   IVCD (intraventricular conduction defect)    Left ventricular noncompaction [facility])    Morbid obesity (HCC)    NSVT (nonsustained ventricular tachycardia) (HCC)    a. 09/2022 Event monitor: predominantly sinus rhythm, avg HR 81 (46-167). 17 runs NSVT (fastest/longest 13 beats x 167). Rare PACs, 17.5% PVC burden.   PVC's (premature ventricular contractions)    a.  09/2022 Event Monitor: 17.5% PVC burden.   Statin intolerance    Tobacco abuse     Medications:  Apixaban  5mg  po BID - last dose 9/14 @ 2134  Assessment: Pateint with PMH including HFrEF with recent EF of 19.2 %, mild PAH, CAD, CKD II, Gout, morbid obesity, NSVT, and tobacco abuse. Patient reports taking Eliquis   PTA received by mail from patient assistance program.(last inpatient dose 9/14 at 2134).  Pharmacy consulted to transition Apixaban  to heparin  infusion for LV thrombus. Plan cath tomorrow 9/16.  Baseline aPTT and HL dose. Hgb and plt stable. CrCl > 90  Goal of Therapy:  Heparin  level 0.3-0.7 units/ml aPTT 66-102 seconds Monitor platelets by anticoagulation protocol: Yes   Date Time aPTT Rate/Comment  0915 1013 30 HL 1.02- Baseline 0915 1913 51 subtherapeutic 0916 0106 55 Subtherapeutic 0916 0550 86 Therapeutic x 1 (level done early), HL 0.64 0917 0031 HL 0.39, therapeutic x 1 after restart 0917  0815  HL 0.5   Therapeutic x 2     Plan:  Heparin  consult and infusion discontinued. Stop heparin  at 2200 with first dose of Eliquis  Start Eliquis  5 mg BID at 2200   Please do not hesitate to reach out with questions or concerns,  Jaun Bash, PharmD, CPP,  BCPS, [facility] Heart Failure Pharmacist  Phone - [phone] 11/29/2023 1:55 PM

## 2023-11-29 NOTE — Consult Note (Addendum)
 PHARMACY - ANTICOAGULATION CONSULT NOTE  Pharmacy Consult for Heparin  infusion Indication: LV thrombus  Allergies  Allergen Reactions   Lactose Intolerance (Gi) Diarrhea   Statins     myalgias    Patient Measurements: Height: 6' 3 (190.5 cm) Weight: (!) 146.5 kg (322 lb 15.6 oz) IBW/kg (Calculated) : 84.5 HEPARIN  DW (KG): 115.2  Vital Signs: Temp: 98.1 F (36.7 C) (09/17 0800) Temp Source: Oral (09/17 0800) BP: 121/89 (09/17 0800) Pulse Rate: 76 (09/17 0800)  Labs: Recent Labs    11/27/23 0318 11/27/23 1013 11/27/23 1913 11/28/23 0106 11/28/23 0550 11/28/23 0551 11/28/23 1306 11/28/23 1315 11/29/23 0031 11/29/23 0445 11/29/23 0815  HGB 12.5*  --   --   --   --  12.6* 14.6  13.9 13.9  --  13.2  --   HCT 40.9  --   --   --   --  40.1 43.0  41.0 41.0  --  41.7  --   PLT 392  --   --   --   --  377  --   --   --  404*  --   APTT  --    < > 51* 55* 86*  --   --   --   --   --   --   HEPARINUNFRC  --    < >  --   --  0.64  --   --   --  0.39  --  0.50  CREATININE 1.49*  --   --   --   --  1.34*  --   --   --  1.20  --    < > = values in this interval not displayed.    Estimated Creatinine Clearance: 117.6 mL/min (by C-G formula based on SCr of 1.2 mg/dL).   Medical History: Past Medical History:  Diagnosis Date   Aortic root dilatation (HCC)    a. 07/2022 Echo: Ao root 4.4cm, Asc Ao 4.0cm.   CAD (coronary artery disease)    a. 08/2017 Cath: LM nl, LAD 74m/d, D1 70ost, LCX 175m, OM1 90 inf branch, 60 sup branch, OM2 small, RCA 30 diffuse, RPDA 100 w/ L->R collats; b. 07/2022 Echo: LM nl, LAD 21m, D1 80, LCX 141m, OM1 80, RCA 75m, RPDA 100-->No targets for PCI/CABG-->Med rx.   CHF (congestive heart failure) (HCC)    takes lasix    Chronic HFrEF (heart failure with reduced ejection fraction) (HCC)    a. 03/2015 Echo: EF 35-40%; b. 08/2017 Echo: EF 15%; c. 08/2017 cMRI: EF 35%; d. 06/2018 Echo: EF 10-15%; e. 07/2022 Echo: EF 15-20%, grIII DD, mild MR, sev dil LA, mod-sev  PH, Ao root 4.4cm, Asc Ao 4.0cm; f. 07/2022 RHC: RA 4, RV 38/3, PA 43/18 (26), PCWP 11. CO/CI 5.3/1.8.   CKD (chronic kidney disease), stage II    Gout    Hyperlipidemia LDL goal <70    Hypertension    Ischemic cardiomyopathy    a. 03/2015 Echo: EF 35-40%; b. 08/2017 Echo: EF 15%; c. 08/2017 cMRI: EF 35%; d. 06/2018 Echo: EF 10-15%; e. 07/2022 Echo: EF 15-20%, grIII DD.   IVCD (intraventricular conduction defect)    Left ventricular noncompaction [facility])    Morbid obesity (HCC)    NSVT (nonsustained ventricular tachycardia) (HCC)    a. 09/2022 Event monitor: predominantly sinus rhythm, avg HR 81 (46-167). 17 runs NSVT (fastest/longest 13 beats x 167). Rare PACs, 17.5% PVC burden.   PVC's (premature ventricular contractions)  a. 09/2022 Event Monitor: 17.5% PVC burden.   Statin intolerance    Tobacco abuse     Medications:  Apixaban  5mg  po BID - last dose 9/14 @ 2134  Assessment: Pateint with PMH including HFrEF with recent EF of 19.2 %, mild PAH, CAD, CKD II, Gout, morbid obesity, NSVT, and tobacco abuse. Patient reports taking Eliquis   PTA received by mail from patient assistance program.(last inpatient dose 9/14 at 2134).  Baseline aPTT and HL dose. Hgb and plt stable. CrCl > 90  Goal of Therapy:  Heparin  level 0.3-0.7 units/ml aPTT 66-102 seconds Monitor platelets by anticoagulation protocol: Yes   Date Time aPTT Rate/Comment  0915 1013 30 HL 1.02- Baseline 0915 1913 51 subtherapeutic 0916 0106 55 Subtherapeutic 0916 0550 86 Therapeutic x 1 (level done early), HL 0.64 0917 0031 HL 0.39, therapeutic x 1 after restart 0917  0815  HL 0.5   Therapeutic x 2     Plan:  Continue heparin  infusion at 2200 units/hr Check CBC and HL daily with AM labs  Continue to monitor H&H and platelets   Ransom Blanch PGY-1 Pharmacy Resident  [place] [facility]  11/29/2023 8:43 AM

## 2023-11-29 NOTE — Consult Note (Signed)
 PHARMACY - ANTICOAGULATION CONSULT NOTE  Pharmacy Consult for Heparin  infusion Indication: LV thrombus  Allergies  Allergen Reactions   Lactose Intolerance (Gi) Diarrhea   Statins     myalgias    Patient Measurements: Height: 6' 3 (190.5 cm) Weight: 133.3 kg (293 lb 14 oz) IBW/kg (Calculated) : 84.5 HEPARIN  DW (KG): 115.2  Vital Signs: Temp: 98.2 F (36.8 C) (09/16 2000) Temp Source: Oral (09/16 2000) BP: 105/87 (09/17 0000) Pulse Rate: 64 (09/17 0000)  Labs: Recent Labs    11/26/23 0425 11/27/23 0318 11/27/23 1013 11/27/23 1013 11/27/23 1913 11/28/23 0106 11/28/23 0550 11/28/23 0551 11/28/23 1306 11/28/23 1315 11/29/23 0031  HGB 12.7* 12.5*  --   --   --   --   --  12.6* 14.6  13.9 13.9  --   HCT 40.4 40.9  --   --   --   --   --  40.1 43.0  41.0 41.0  --   PLT 381 392  --   --   --   --   --  377  --   --   --   APTT  --   --  30   < > 51* 55* 86*  --   --   --   --   HEPARINUNFRC  --   --  1.02*  --   --   --  0.64  --   --   --  0.39  CREATININE 1.22 1.49*  --   --   --   --   --  1.34*  --   --   --    < > = values in this interval not displayed.    Estimated Creatinine Clearance: 100.2 mL/min (A) (by C-G formula based on SCr of 1.34 mg/dL (H)).   Medical History: Past Medical History:  Diagnosis Date   Aortic root dilatation (HCC)    a. 07/2022 Echo: Ao root 4.4cm, Asc Ao 4.0cm.   CAD (coronary artery disease)    a. 08/2017 Cath: LM nl, LAD 19m/d, D1 70ost, LCX 137m, OM1 90 inf branch, 60 sup branch, OM2 small, RCA 30 diffuse, RPDA 100 w/ L->R collats; b. 07/2022 Echo: LM nl, LAD 32m, D1 80, LCX 161m, OM1 80, RCA 9m, RPDA 100-->No targets for PCI/CABG-->Med rx.   CHF (congestive heart failure) (HCC)    takes lasix    Chronic HFrEF (heart failure with reduced ejection fraction) (HCC)    a. 03/2015 Echo: EF 35-40%; b. 08/2017 Echo: EF 15%; c. 08/2017 cMRI: EF 35%; d. 06/2018 Echo: EF 10-15%; e. 07/2022 Echo: EF 15-20%, grIII DD, mild MR, sev dil LA, mod-sev  PH, Ao root 4.4cm, Asc Ao 4.0cm; f. 07/2022 RHC: RA 4, RV 38/3, PA 43/18 (26), PCWP 11. CO/CI 5.3/1.8.   CKD (chronic kidney disease), stage II    Gout    Hyperlipidemia LDL goal <70    Hypertension    Ischemic cardiomyopathy    a. 03/2015 Echo: EF 35-40%; b. 08/2017 Echo: EF 15%; c. 08/2017 cMRI: EF 35%; d. 06/2018 Echo: EF 10-15%; e. 07/2022 Echo: EF 15-20%, grIII DD.   IVCD (intraventricular conduction defect)    Left ventricular noncompaction [facility])    Morbid obesity (HCC)    NSVT (nonsustained ventricular tachycardia) (HCC)    a. 09/2022 Event monitor: predominantly sinus rhythm, avg HR 81 (46-167). 17 runs NSVT (fastest/longest 13 beats x 167). Rare PACs, 17.5% PVC burden.   PVC's (premature ventricular contractions)    a. 09/2022  Event Monitor: 17.5% PVC burden.   Statin intolerance    Tobacco abuse     Medications:  Apixaban  5mg  po BID - last dose 9/14 @ 2134  Assessment: Pateint with PMH including HFrEF with recent EF of 19.2 %, mild PAH, CAD, CKD II, Gout, morbid obesity, NSVT, and tobacco abuse. Patient reports taking Eliquis   PTA received by mail from patient assistance program.(last inpatient dose 9/14 at 2134).  Pharmacy consulted to transition Apixaban  to heparin  infusion for LV thrombus. Plan cath tomorrow 9/16.  Baseline aPTT and HL dose. Hgb and plt stable. CrCl > 90  Goal of Therapy:  Heparin  level 0.3-0.7 units/ml aPTT 66-102 seconds Monitor platelets by anticoagulation protocol: Yes   Date Time aPTT Rate/Comment  0915 1013 30 HL 1.02- Baseline 0915 1913 51 subtherapeutic 0916 0106 55 Subtherapeutic 0916 0550 86 Therapeutic x 1 (level done early), HL 0.64 0917 0031 HL 0.39, therapeutic x 1 after restart     Plan:  Continue heparin  infusion at 2200 units/hr Recheck HL at 0800 to confirm   Continue to monitor H&H and platelets  Rankin CANDIE Dills, PharmD, [facility] 11/29/2023 1:29 AM

## 2023-11-29 NOTE — Progress Notes (Signed)
 Patient ID: Philip Monroe, male   DOB: Mar 11, 1976, 48 y.o.   MRN: [id number]     Advanced Heart Failure Rounding Note  Cardiologist: None  Chief Complaint: CHF Subjective:    I/Os net negative 3113, creatinine trending down at 1.2.  He is on Lasix  gtt 12 mg/hr and milrinone  0.25, co-ox 77.5%.  CVP down to 6 this afternoon.   Gouty pain seems improved on prednisone .   LHC/RHC: Coronary Findings  Diagnostic Dominance: Right Left Anterior Descending  Mid LAD lesion is 40% stenosed.  Dist LAD lesion is 40% stenosed.    First Diagonal Branch  1st Diag lesion is 70% stenosed.    Ramus Intermedius  Ramus lesion is 90% stenosed.    Left Circumflex  Mid Cx lesion is 100% stenosed.    Right Coronary Artery  Mid RCA lesion is 60% stenosed.    Right Posterior Descending Artery  Collaterals  RPDA filled by collaterals from Dist LAD.    RPDA lesion is 100% stenosed.    Right Posterior Atrioventricular Artery  RPAV-1 lesion is 80% stenosed.  RPAV-2 lesion is 90% stenosed.    Intervention   No interventions have been documented.   Right Heart  Right Heart Pressures RHC Procedural Findings: Hemodynamics (mmHg) RA mean 17 RV 80/25 PA 83/53, mean 59 PCWP mean 27 LV 108/28 AO 109/80  Oxygen saturations: PA 49% AO 93%  Cardiac Output (Fick) 4.13  Cardiac Index (Fick) 1.6  PVR 7.7 WU  Cardiac Output (Thermo) 4.57  Cardiac Index (Thermo) 1.77 PVR 7 WU     Objective:   Weight Range: (!) 146.5 kg Body mass index is 40.37 kg/m.   Vital Signs:   Temp:  [97.6 F (36.4 C)-98.3 F (36.8 C)] 97.8 F (36.6 C) (09/17 1200) Pulse Rate:  [49-81] 76 (09/17 1200) Resp:  [15-26] 23 (09/17 1200) BP: (105-137)/(64-99) 129/80 (09/17 1200) SpO2:  [86 %-99 %] 86 % (09/17 1200) Weight:  [146.5 kg] 146.5 kg (09/17 0600) Last BM Date : 11/29/23  Weight change: Filed Weights   11/27/23 0500 11/28/23 0500 11/29/23 0600  Weight: 134.3 kg 133.3 kg (!) 146.5 kg     Intake/Output:   Intake/Output Summary (Last 24 hours) at 11/29/2023 1331 Last data filed at 11/29/2023 1229 Gross per 24 hour  Intake 1529.44 ml  Output 4900 ml  Net -3370.56 ml      Physical Exam    General: NAD Neck: No JVD, no thyromegaly or thyroid nodule.  Lungs: Clear to auscultation bilaterally with normal respiratory effort. CV: Lateral PMI.  Heart regular S1/S2, no S3/S4, no murmur.  No peripheral edema.    Abdomen: Soft, nontender, no hepatosplenomegaly, no distention.  Skin: Intact without lesions or rashes.  Neurologic: Alert and oriented x 3.  Psych: Normal affect. Extremities: No clubbing or cyanosis.  HEENT: Normal.   Telemetry   NSR with occasional PVCs, personally reviewed   Labs    CBC Recent Labs    11/27/23 0318 11/28/23 0551 11/28/23 1306 11/28/23 1315 11/29/23 0445  WBC 9.5 10.0  --   --  12.5*  NEUTROABS 5.2 4.6  --   --   --   HGB 12.5* 12.6*   < > 13.9 13.2  HCT 40.9 40.1   < > 41.0 41.7  MCV 80.0 78.8*  --   --  78.4*  PLT 392 377  --   --  404*   < > = values in this interval not displayed.   Basic Metabolic Panel  Recent Labs    11/28/23 0551 11/28/23 1306 11/28/23 1315 11/28/23 2131 11/29/23 0445  NA 135   < > 136  --  132*  K 3.8   < > 3.8 4.3 3.5  CL 96*  --   --   --  96*  CO2 27  --   --   --  25  GLUCOSE 91  --   --   --  207*  BUN 26*  --   --   --  21*  CREATININE 1.34*  --   --   --  1.20  CALCIUM 8.6*  --   --   --  8.6*  MG  --   --   --  2.1 2.1  PHOS  --   --   --   --  2.9   < > = values in this interval not displayed.   Liver Function Tests Recent Labs    11/28/23 0550  AST 23  ALT 24  ALKPHOS 62  BILITOT 1.2  PROT 6.4*  ALBUMIN 2.9*   No results for input(s): LIPASE, AMYLASE in the last 72 hours. Cardiac Enzymes No results for input(s): CKTOTAL, CKMB, CKMBINDEX, TROPONINI in the last 72 hours.  BNP: BNP (last 3 results) Recent Labs    05/26/23 1048 09/25/23 0958  11/24/23 1618  BNP 204.2* 1,509.6* 1,700.4*    ProBNP (last 3 results) No results for input(s): PROBNP in the last 8760 hours.   D-Dimer No results for input(s): DDIMER in the last 72 hours. Hemoglobin A1C Recent Labs    11/29/23 0445  HGBA1C 6.6*   Fasting Lipid Panel No results for input(s): CHOL, HDL, LDLCALC, TRIG, CHOLHDL, LDLDIRECT in the last 72 hours. Thyroid Function Tests No results for input(s): TSH, T4TOTAL, T3FREE, THYROIDAB in the last 72 hours.  Invalid input(s): FREET3  Other results:   Imaging    US  EKG SITE RITE Result Date: 11/28/2023 If Site Rite image not attached, placement could not be confirmed due to current cardiac rhythm.    Medications:     Scheduled Medications:  allopurinol   100 mg Oral Daily   Chlorhexidine  Gluconate Cloth  6 each Topical Daily   colchicine   0.6 mg Oral Daily   digoxin   0.125 mg Oral Daily   empagliflozin   10 mg Oral Daily   ezetimibe   10 mg Oral QHS   insulin  aspart  0-5 Units Subcutaneous QHS   insulin  aspart  0-9 Units Subcutaneous TID WC   mexiletine  300 mg Oral BID   [START ON 11/30/2023] multivitamin with minerals  1 tablet Oral Daily   potassium chloride   40 mEq Oral Q4H   predniSONE   40 mg Oral Q breakfast   sacubitril -valsartan   1 tablet Oral BID   sodium chloride  flush  3 mL Intravenous Q12H   sodium chloride  flush  3 mL Intravenous Q12H   spironolactone   25 mg Oral Daily    Infusions:  sodium chloride      furosemide  (LASIX ) 200 mg in dextrose  5 % 100 mL (2 mg/mL) infusion 12 mg/hr (11/29/23 1229)   heparin  2,200 Units/hr (11/29/23 1229)   milrinone  0.25 mcg/kg/min (11/29/23 1229)    PRN Medications: sodium chloride , acetaminophen , acetaminophen , HYDROcodone -acetaminophen , ondansetron  (ZOFRAN ) IV, ondansetron  (ZOFRAN ) IV, sodium chloride  flush, sodium chloride  flush, sodium chloride  flush   Assessment/Plan   1. 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  RHC 05/24 showed normal  LV and RV filling pressures, mild pulmonary hypertension with mild elevation of PVR, low cardiac index 1.8.  Echo 03/25 with EF <20%, LV with GHK, LV severely dilated, RV mod reduced, LA severely dilated, mild-mod MR.  Echo this admission with EF < 20%, severe LV dilation, moderate RV enlargement with low normal systolic function, moderate MR, dilated IVC.  PVCs may also contribute, 20.8% on Zio monitor in 5/25.  He had run out of most medications and for the last few weeks was only taking Eliquis , torsemide , and Jardiance .  The week prior to admission, he only took torsemide  twice due to gout pain and difficulty getting to the bathroom.  RHC showed markedly elevated filling pressures with low CI (1.6 Fick/1.77 thermo). Milrinone  0.25 and Lasix  gtt 12 mg/hr begun.  Creatinine 1.49 => 1.34 => 1.2. BP stable today. Co-ox 77.5% with CVP down to 6 this afternoon after excellent diuresis. Feels better.  - Continue Lasix  gtt 12 mg/hr until 8 pm tonight then stop.  Back to po diuretic tomorrow.  - Start milrinone  wean tomorrow.    - Continue digoxin .  - Stop losartan , start Entresto  24/26 bid.  - spironolactone  25 mg daily restarted.  - Off Toprol  XL with low output.  - Continue Jardiance  10 mg daily.  - Would be reasonable to get cardiac MRI to confirm noncompaction diagnosis (after diuresis and cath), will order after full diuresis likely tomorrow.  - With weight and smoking, not a candidate for heart transplant.  - With consistently depressed EF, he is an ICD candidate. QRS likely not wide enough to benefit from CRT.  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  Low cardiac output on RHC this admission.  Noncompliance with medications complicates situation. He is now on milrinone  and diuresing well, will try to wean off.  Though compliance is a concern, I think that he would be a potential  LVAD candidate. If we can wean him off milrinone , will have him followup in LVAD clinic.  If we cannot wean him off, may directly transfer to [facility] for faster LVAD evaluation.  2. CAD:  LHC 5/24 at [facility] showed severe multivessel CAD including 80% mid RCA, 100% RPDA, 100% mid LCx, 80% OM1, 80% mid-distal LAD. Medical management recommended by [facility] cardiology 05/24. No chest pain.  Mild elevation in HS-TnI with no trend this admission is likely due to demand ischemia from volume overload. Coronary angiography this admission showed occluded PDA and mid AV LCx, 90% ramus, severe diffuse disease in right PLV, nonobstructive LAD disease.  With markedly low EF and without severe LAD disease, I do not think CABG is a good option.  - No ASA given Eliquis  use and stable CAD.  - Continue Zetia , has not been able to tolerate statin.  Would refer to lipid clinic for Repatha as outpatient.  - Best future option will likely be LVAD.  3. PVCs: Zio 5/25 with 21% burden.  He did not tolerate amiodarone  well and was started on mexiletine. He has not been taking mexiletine recently (out of med x week). Few PVCs on telemetry despite milrinone  use.  - Restarted mexiletine 300 mg bid this admission.   - Will need sleep study as outpatient.  4. Morbid obesity: GLP-1 agonist would be ideal as outpatient.  5. Tobacco use: Smoking about 1/2 ppd.  6. 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  - Transition from heparin  gtt back to Eliquis .  7.  Gout: Very symptomatic, knees and ankles.  Has been on allopurinol  and colchicine .  Had not been taking torsemide  regularly at home so that he did not have to walk to the bathroom with gout pain.  - Added prednisone  burst for ongoing gouty pain, pain improved.    CRITICAL CARE Performed by: Ezra Shuck  Total critical care time: 35 minutes  Critical care time was exclusive of separately billable procedures and treating  other patients.  Critical care was necessary to treat or prevent imminent or life-threatening deterioration.  Critical care was time spent personally by me on the following activities: development of treatment plan with patient and/or surrogate as well as nursing, discussions with consultants, evaluation of patient's response to treatment, examination of patient, obtaining history from patient or surrogate, ordering and performing treatments and interventions, ordering and review of laboratory studies, ordering and review of radiographic studies, pulse oximetry and re-evaluation of patient's condition.   Length of Stay: 5  Ezra Shuck, MD  11/29/2023, 1:31 PM  Advanced Heart Failure Team Pager [phone] (M-F; 7a - 5p)  Please contact [facility] for night-coverage after hours (5p -7a ) and weekends on [email or web address]

## 2023-11-29 NOTE — TOC Progression Note (Addendum)
 Transition of Care [facility]) - Progression Note    Patient Details  Name: RAKESH DUTKO MRN: [id number] Date of Birth: April 13, 1975  [facility] of [facility]) CM/SW Contact  Delphine KANDICE Bring, RN Phone Number: 11/29/2023, 11:05 AM  Clinical Narrative:    Patient had heart cath 9/16 and statred on Milrinone  drip. Patient states he lives with his sister at [address] in [place]. Patient states that he has 15 steps to climb to get into the apartment. He states there is no elevator.  When Cm asked about his insurance, patinet states he think he has Medicaid. Patient state that he not sure about his medication benefits for his medication. CM explained that when he is d/c the MD can send his medication to meds to bed. Patient is in agreement. Cm asked who will transport at time of d/c . Patient states it depends on the time of day of his discharge.                      Expected Discharge Plan and Services                                               Social Drivers of Health (SDOH) Interventions SDOH Screenings   Food Insecurity: No Food Insecurity (11/25/2023)  Housing: Low Risk  (11/25/2023)  Transportation Needs: Unmet Transportation Needs (11/25/2023)  Utilities: Not At Risk (11/25/2023)  Financial Resource Strain: Medium Risk (05/22/2023)  Tobacco Use: High Risk (11/24/2023)    Readmission Risk Interventions     No data to display

## 2023-11-29 NOTE — Plan of Care (Signed)
 Nutrition Education Note  Pt screened on the MST report for reported weight loss  Met with pt in room today. Pt reports poor appetite and oral intake for the past 3 days. Pt reports that his oral intake is improved in hospital; pt eating 100% of meals. Per chart, pt is down 37lbs(10%) over the past months. Pt is unsure if he has had any weight loss but reports that his shirts are fitting a little more loosely. Pt believes that the weight loss is in r/t to fluid changes. RD discussed with pt the importance of adequate nutrition needed to preserve lean muscle. Pt declines ONS today. RD will liberalize pt's diet as the heart healthy diet is restrictive of protein.   RD provided Low Sodium Nutrition Therapy handout from the [employer]. Reviewed patient's dietary recall. Provided examples on ways to decrease sodium intake in diet. Discouraged intake of processed foods and use of salt shaker. Encouraged fresh fruits and vegetables as well as whole grain sources of carbohydrates to maximize fiber intake.   RD discussed why it is important for patient to adhere to diet recommendations, and emphasized the role of fluids, foods to avoid, and importance of weighing self daily. Teach back method used.  Expect fair compliance.  Body mass index is 40.37 kg/m. Pt meets criteria for obesity based on current BMI.  No further nutrition interventions warranted at this time. RD contact information provided. If additional nutrition issues arise, please re-consult RD.   Augustin Shams MS, RD, LDN If unable to be reached, please send secure chat to RD inpatient available from 8:00a-4:00p daily

## 2023-11-30 ENCOUNTER — Inpatient Hospital Stay (HOSPITAL_COMMUNITY)

## 2023-11-30 DIAGNOSIS — I5023 Acute on chronic systolic (congestive) heart failure: Secondary | ICD-10-CM

## 2023-11-30 DIAGNOSIS — I2510 Atherosclerotic heart disease of native coronary artery without angina pectoris: Secondary | ICD-10-CM | POA: Diagnosis not present

## 2023-11-30 LAB — CBC
HCT: 46.9 % (ref 39.0–52.0)
Hemoglobin: 14.5 g/dL (ref 13.0–17.0)
MCH: 24.5 pg — ABNORMAL LOW (ref 26.0–34.0)
MCHC: 30.9 g/dL (ref 30.0–36.0)
MCV: 79.1 fL — ABNORMAL LOW (ref 80.0–100.0)
Platelets: 419 K/uL — ABNORMAL HIGH (ref 150–400)
RBC: 5.93 MIL/uL — ABNORMAL HIGH (ref 4.22–5.81)
RDW: 18.6 % — ABNORMAL HIGH (ref 11.5–15.5)
WBC: 14.2 K/uL — ABNORMAL HIGH (ref 4.0–10.5)
nRBC: 0 % (ref 0.0–0.2)

## 2023-11-30 LAB — BASIC METABOLIC PANEL WITH GFR
Anion gap: 8 (ref 5–15)
BUN: 25 mg/dL — ABNORMAL HIGH (ref 6–20)
CO2: 24 mmol/L (ref 22–32)
Calcium: 8.8 mg/dL — ABNORMAL LOW (ref 8.9–10.3)
Chloride: 100 mmol/L (ref 98–111)
Creatinine, Ser: 1.27 mg/dL — ABNORMAL HIGH (ref 0.61–1.24)
GFR, Estimated: >60 mL/min (ref >60)
Glucose, Bld: 152 mg/dL — ABNORMAL HIGH (ref 70–99)
Potassium: 3.9 mmol/L (ref 3.5–5.1)
Sodium: 132 mmol/L — ABNORMAL LOW (ref 135–145)

## 2023-11-30 LAB — GLUCOSE, CAPILLARY
Glucose-Capillary: 94 mg/dL (ref 70–99)
Glucose-Capillary: 198 mg/dL — ABNORMAL HIGH (ref 70–99)
Glucose-Capillary: 150 mg/dL — ABNORMAL HIGH (ref 70–99)
Glucose-Capillary: 211 mg/dL — ABNORMAL HIGH (ref 70–99)
Glucose-Capillary: 113 mg/dL — ABNORMAL HIGH (ref 70–99)

## 2023-11-30 LAB — COOXEMETRY PANEL
Carboxyhemoglobin: 2.7 % — ABNORMAL HIGH (ref 0.5–1.5)
Carboxyhemoglobin: 2.2 % — ABNORMAL HIGH (ref 0.5–1.5)
Methemoglobin: <0.7 % (ref 0.0–1.5)
Methemoglobin: 0.9 % (ref 0.0–1.5)
O2 Saturation: 80.3 %
O2 Saturation: 83.5 %
Total hemoglobin: 14.7 g/dL (ref 12.0–16.0)
Total hemoglobin: 15.9 g/dL (ref 12.0–16.0)
Total oxygen content: 78.1 %
Total oxygen content: 80.9 %

## 2023-11-30 LAB — PHOSPHORUS: Phosphorus: 3.7 mg/dL (ref 2.5–4.6)

## 2023-11-30 LAB — MAGNESIUM: Magnesium: 2.2 mg/dL (ref 1.7–2.4)

## 2023-11-30 LAB — LIPOPROTEIN A (LPA): Lipoprotein (a): 426.1 nmol/L — ABNORMAL HIGH (ref <75.0)

## 2023-11-30 MED ORDER — TORSEMIDE 20 MG PO TABS
40.0000 mg | ORAL_TABLET | Freq: Every day | ORAL | Status: DC
  Administered 2023-11-30: 40 mg via ORAL
  Filled 2023-11-30: qty 2

## 2023-11-30 MED ORDER — POTASSIUM CHLORIDE CRYS ER 20 MEQ PO TBCR
40.0000 meq | EXTENDED_RELEASE_TABLET | Freq: Once | ORAL | Status: AC
  Administered 2023-11-30: 40 meq via ORAL
  Filled 2023-11-30: qty 2

## 2023-11-30 MED ORDER — POTASSIUM CHLORIDE CRYS ER 20 MEQ PO TBCR
20.0000 meq | EXTENDED_RELEASE_TABLET | Freq: Once | ORAL | Status: AC
  Administered 2023-11-30: 20 meq via ORAL
  Filled 2023-11-30: qty 1

## 2023-11-30 MED ORDER — ALUM & MAG HYDROXIDE-SIMETH 200-200-20 MG/5ML PO SUSP
30.0000 mL | ORAL | Status: DC | PRN
  Administered 2023-11-30: 30 mL via ORAL
  Filled 2023-11-30: qty 30

## 2023-11-30 MED ORDER — SACUBITRIL-VALSARTAN 49-51 MG PO TABS
1.0000 | ORAL_TABLET | Freq: Two times a day (BID) | ORAL | Status: DC
  Administered 2023-11-30 – 2023-12-04 (×7): 1 via ORAL
  Filled 2023-11-30 (×7): qty 1

## 2023-11-30 MED ORDER — GADOBUTROL 1 MMOL/ML IV SOLN
17.0000 mL | Freq: Once | INTRAVENOUS | Status: AC | PRN
Start: 2023-11-30 — End: 2023-11-30
  Administered 2023-11-30: 17 mL via INTRAVENOUS

## 2023-11-30 NOTE — Progress Notes (Signed)
 PHARMACY CONSULT NOTE - ELECTROLYTES  Pharmacy Consult for Electrolyte Monitoring and Replacement   Recent Labs: Height: 6' 3 (190.5 cm) Weight: (!) 139.9 kg (308 lb 6.8 oz) IBW/kg (Calculated) : 84.5 Estimated Creatinine Clearance: 108.5 mL/min (A) (by C-G formula based on SCr of 1.27 mg/dL (H)). Potassium (mmol/L)  Date Value  11/30/2023 3.9   Magnesium (mg/dL)  Date Value  90/81/7974 2.2   Calcium (mg/dL)  Date Value  90/81/7974 8.8 (L)   Albumin (g/dL)  Date Value  90/83/7974 2.9 (L)   Phosphorus (mg/dL)  Date Value  90/81/7974 3.7   Sodium (mmol/L)  Date Value  11/30/2023 132 (L)    Assessment  Philip Monroe is a 48 y.o. male presenting with acute exacerbation of CHF. 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 Pharmacy has been consulted to monitor and replace electrolytes.  Diet: heart MIVF:  Pertinent medications: torsemide   Goal of Therapy:  K >4, Mg > 2 Other electrolytes WNL  9/18: K 3.9, Mg 2.2, Phos 3.7  Plan:  Kcl 20 mEq PO x 1 given this AM  Will order additional Kcl 40 mEq PO x 1 d/t patient starting torsemide  and to keep above goal K >4 Check BMP, Mg, Phos with AM labs  Thank you for allowing pharmacy to be a part of this patient's care.   Ransom Blanch PGY-1 Pharmacy Resident  [facility]  11/30/2023 7:39 AM

## 2023-11-30 NOTE — Progress Notes (Signed)
 NAME:  Philip Monroe, MRN:  [id number], DOB:  1975/07/07, LOS: 6 ADMISSION DATE:  11/24/2023, CONSULTATION DATE: 11/28/23  History of Present Illness:  The patient is a 48 year old male with past medical history of heart failure and coronary artery disease as well as CKD who presents to the hospital with increased shortness of breath.     Patient presented on 9/12 complaining of worsening shortness of breath of 2 weeks in duration with associated lower extremity swelling.  He also reports knee and ankle pain. He denies having any chest pain and denies having any infectious symptoms.  On presentation to the ED, blood work was notable for a BNP of 1700 prompting admission to the hospital for IV diuresis.   Patient admitted to the hospitalist service and initiated on IV furosemide  and cardiology was consulted. TTE showed marked drop in EF and LV dilation, worse from prior. He was seen by Dr. Rolan on 9/15 in consultation given his complex history of cardiomyopathy with low cardiac output and cardiac index and multiple hospitalizations for decompensated heart failure.  It appears that the patient is not compliant with all his medications.  Given all this, he underwent right and left heart cath today and is admitted to the ICU for ionotropic support.   -RHC 08/2017: RA 6, RV 49/7, PA 50/25 (34), PCWP 19, CO/CI 7.6/2.9, SVR 1158 dynes, PVR 1.9 wood, LVEDP 22 mmHG -RHC 07/2022: RA 4, RV 38/3, PA 43/18 (26), PCWP 11, CO/CI 5.3/1.8, PVR 2.83 wood -LHC 07/2022: multivessel disease with 80% occluded mid RCA, 100% occluded RPDA, 100% occluded mid left circumflex, 80% occluded OM1, and 80% occluded mid distal LAD.   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 Gout Morbid obesity Tobacco abuse History of apical mural thrombus  Significant Hospital Events: Including procedures, antibiotic start and stop dates in addition to other pertinent events   09/12: Admitted to the telemetry unit with acute on chronic combined systolic diastolic CHF and apical mural thrombus  09/16: RHC/LHC revealed severely elevated left and right-sided filling pressures, 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  Pt transferred to ICU for milrinone  and lasix  gtts.  09/17: Pt remains on furosemide  gtt @12  mg/hr and milrinone  gtt @0 .25 mcg/kg/min.  Overnight UOP 2.8L and since admission pt -8.9L.  Per heart failure team recommendations lasix  gtt discontinued 2000 09/18: Heart failure team recommending weaning of milrinone  gtt today.  MR Cardiac Morphology W WO Contrast pending.  UOP overnight 550 ml and since admission -12L.  Total oxygen content 80.9.  If pt remains off milrinone  gtt will transfer to telemetry unit and transfer service to TRH.     Interim History / Subjective:  No acute events overnight.    Objective    Blood pressure 121/70, pulse 66, temperature 97.9 F (36.6 C), temperature source Oral, resp. rate 18, height 6' 3 (1.905 m), weight (!) 139.9 kg, SpO2 (!) 87%. CVP:  [5 mmHg-10 mmHg] 5 mmHg      Intake/Output Summary (Last 24 hours) at 11/30/2023 [phone]  Last data filed at 11/30/2023 9383 Gross per 24 hour  Intake 1460.38 ml  Output 4500 ml  Net -3039.62 ml   Filed Weights   11/28/23 0500 11/29/23 0600 11/30/23 0500  Weight: 133.3 kg (!) 146.5 kg (!) 139.9 kg    Examination: General: Well-developed-well nourished male, NAD  HENT: Supple, mild JVD  Lungs: Clear throughout, even, non labored  Cardiovascular:  NSR, s1s2, no r/g, 2+ radial/1+ distal pulses, trace bilateral lower extremity edema  Abdomen: +BS x4, obese, non distended, non tender  Extremities: Normal bulk and tone, moves all extremities  Neuro: Alert and oriented, follows commands, PERRLA  GU: Voiding   Resolved problem list   Assessment and Plan   #Acute Decompensated HFrEF (ACC/AHA D/NYHA III) #CAD #Morbid Obesity #Apical Mural Thrombus #HTN - Continuous telemetry monitoring  - Heart failure team consulted appreciate input: attempting to wean off milrinone  gtt today per recs  - Lasix  gtt discontinued 09/17 - Fluid restrictions  - Heparin  gtt  - Continue entresto , mexitil , and aldactone   - Follow cooxemetry panel daily  - Maintain map >65 - MR Cardiac Morphology W WO Contrast pending   #CKD stage II  #Mild hyponatremia  - Trend BMP  - Replace electrolytes as indicated  - Strict I&O's - Avoid nephrotoxic agents as able   #Hyperglycemia  Hemoglobin A1c: 6.6  - CBGs ac/hs  - SSI  - Target CBG readings 140 to 180 while in ICU  - Follow hypo/hyperglycemic protocol   Labs   CBC: Recent Labs  Lab 11/26/23 0425 11/27/23 0318 11/28/23 0551 11/28/23 1306 11/28/23 1315 11/29/23 0445 11/30/23 0409  WBC 10.7* 9.5 10.0  --   --  12.5* 14.2*  NEUTROABS 5.7 5.2 4.6  --   --   --   --   HGB 12.7* 12.5* 12.6* 14.6  13.9 13.9 13.2 14.5  HCT 40.4 40.9 40.1 43.0  41.0 41.0 41.7 46.9  MCV 78.6* 80.0 78.8*  --   --  78.4* 79.1*  PLT 381 392 377  --   --  404* 419*    Basic Metabolic Panel: Recent Labs  Lab 11/27/23 0318 11/28/23 0551 11/28/23 1306 11/28/23 1315 11/28/23 2131 11/29/23 0445 11/29/23 1849 11/30/23 0409  NA 135 135 136  138 136  --  132* 132* 132*  K 3.7 3.8 4.0  3.7 3.8 4.3 3.5 4.9 3.9  CL 97* 96*  --   --   --  96* 98 100  CO2 28 27  --   --   --  25 24 24   GLUCOSE 109* 91  --   --   --  207* 118* 152*  BUN 28* 26*  --   --   --  21* 24* 25*  CREATININE 1.49* 1.34*  --   --   --  1.20  1.48* 1.27*  CALCIUM 8.5* 8.6*  --   --   --  8.6* 9.2 8.8*  MG  --   --   --   --  2.1 2.1  --  2.2  PHOS  --   --   --   --   --  2.9  --  3.7   GFR: Estimated Creatinine Clearance: 108.5 mL/min (A) (by C-G formula based on SCr of 1.27 mg/dL (H)). Recent Labs  Lab 11/27/23 0318 11/28/23 0551 11/28/23 1722 11/29/23 0445 11/30/23 0409  WBC 9.5 10.0  --  12.5* 14.2*  LATICACIDVEN  --   --  1.6  --   --  Liver Function Tests: Recent Labs  Lab 11/24/23 1618 11/28/23 0550  AST 23 23  ALT 20 24  ALKPHOS 64 62  BILITOT 1.8* 1.2  PROT 6.9 6.4*  ALBUMIN 3.1* 2.9*   No results for input(s): LIPASE, AMYLASE in the last 168 hours. No results for input(s): AMMONIA in the last 168 hours.  ABG    Component Value Date/Time   PHART 7.423 11/28/2023 1315   PCO2ART 36.2 11/28/2023 1315   PO2ART 72 (L) 11/28/2023 1315   HCO3 23.7 11/28/2023 1315   TCO2 25 11/28/2023 1315   O2SAT 80.3 11/30/2023 0500     Coagulation Profile: No results for input(s): INR, PROTIME in the last 168 hours.  Cardiac Enzymes: No results for input(s): CKTOTAL, CKMB, CKMBINDEX, TROPONINI in the last 168 hours.  HbA1C: Hgb A1c MFr Bld  Date/Time Value Ref Range Status  11/29/2023 04:45 AM 6.6 (H) 4.8 - 5.6 % Final    Comment:    (NOTE) Diagnosis of Diabetes The following HbA1c ranges recommended by the American Diabetes Association (ADA) may be used as an aid in the diagnosis of diabetes mellitus.  Hemoglobin             Suggested A1C NGSP%              Diagnosis  <5.7                   Non Diabetic  5.7-6.4                Pre-Diabetic  >6.4                   Diabetic  <7.0                   Glycemic control for                       adults with diabetes.      CBG: Recent Labs  Lab 11/28/23 1549 11/29/23 1226 11/29/23 1627 11/29/23 2127  GLUCAP 186* 123* 151* 115*    Review of Systems: Positives in BOLD   Gen: Denies fever, chills, weight change, fatigue,  night sweats HEENT: Denies blurred vision, double vision, hearing loss, tinnitus, sinus congestion, rhinorrhea, sore throat, neck stiffness, dysphagia PULM: Denies shortness of breath, cough, sputum production, hemoptysis, wheezing CV: Denies chest pain, edema, orthopnea, paroxysmal nocturnal dyspnea, palpitations GI: Denies abdominal pain, nausea, vomiting, diarrhea, hematochezia, melena, constipation, change in bowel habits GU: Denies dysuria, hematuria, polyuria, oliguria, urethral discharge Endocrine: Denies hot or cold intolerance, polyuria, polyphagia or appetite change Derm: Denies rash, dry skin, scaling or peeling skin change Heme: Denies easy bruising, bleeding, bleeding gums Neuro: Denies headache, numbness, weakness, slurred speech, loss of memory or consciousness   Past Medical History:  He,  has a past medical history of Aortic root dilatation (HCC), CAD (coronary artery disease), CHF (congestive heart failure) (HCC), Chronic HFrEF (heart failure with reduced ejection fraction) (HCC), CKD (chronic kidney disease), stage II, Gout, Hyperlipidemia LDL goal <70, Hypertension, Ischemic cardiomyopathy, IVCD (intraventricular conduction defect), Left ventricular noncompaction (HCC), Morbid obesity (HCC), NSVT (nonsustained ventricular tachycardia) (HCC), PVC's (premature ventricular contractions), Statin intolerance, and Tobacco abuse.   Surgical History:   Past Surgical History:  Procedure Laterality Date   RIGHT/LEFT HEART CATH AND CORONARY ANGIOGRAPHY N/A 11/28/2023   Procedure: RIGHT/LEFT HEART CATH AND CORONARY ANGIOGRAPHY;  Surgeon: Rolan Ezra RAMAN, MD;  Location: [facility] INVASIVE CV LAB;  Service: Cardiovascular;  Laterality: N/A;     Social History:   reports that he has been smoking cigarettes. He has never used smokeless tobacco. He reports that he does not currently use alcohol after a past usage of about 36.0 standard drinks of alcohol per week. He reports that he does not use  drugs.   Family History:  His family history is not on file.   Allergies Allergies  Allergen Reactions   Lactose Intolerance (Gi) Diarrhea   Statins     myalgias     Home Medications  Prior to Admission medications   Medication Sig Start Date End Date Taking? Authorizing Provider  colchicine  0.6 MG tablet Take 1 tablet (0.6 mg total) by mouth 2 (two) times daily. 09/26/23  Yes Caleen Qualia, MD  allopurinol  (ZYLOPRIM ) 100 MG tablet Take 1 tablet (100 mg total) by mouth daily. Patient not taking: Reported on 11/24/2023 09/14/23 12/13/23  Viviann Pastor, MD  aspirin  81 MG chewable tablet Chew 1 tablet (81 mg total) by mouth daily. Patient not taking: Reported on 11/24/2023 05/24/23   Amin, Sumayya, MD  cyanocobalamin  (VITAMIN B12) 1000 MCG tablet Take 1 tablet (1,000 mcg total) by mouth daily. Patient not taking: Reported on 11/24/2023 05/24/23   Amin, Sumayya, MD  ELIQUIS  5 MG TABS tablet Take 1 tablet (5 mg total) by mouth 2 (two) times daily. Patient not taking: Reported on 11/24/2023 09/14/23 12/13/23  Viviann Pastor, MD  empagliflozin  (JARDIANCE ) 10 MG TABS tablet Take 1 tablet (10 mg total) by mouth daily. Patient not taking: Reported on 11/24/2023 09/14/23 12/13/23  Viviann Pastor, MD  ezetimibe  (ZETIA ) 10 MG tablet Take 1 tablet (10 mg total) by mouth at bedtime. Patient not taking: Reported on 11/24/2023 09/14/23 12/13/23  Viviann Pastor, MD  HYDROcodone -acetaminophen  (NORCO/VICODIN) 5-325 MG tablet Take 1 tablet by mouth every 4 (four) hours as needed. Patient not taking: Reported on 11/24/2023 06/21/23   Margrette Monte A, PA-C  losartan  (COZAAR ) 25 MG tablet Take 1 tablet (25 mg total) by mouth daily. Patient not taking: Reported on 11/24/2023 06/22/23   Donette Ellouise LABOR, FNP  metoprolol  succinate (TOPROL -XL) 25 MG 24 hr tablet Take 1 tablet (25 mg total) by mouth daily. Patient not taking: Reported on 11/24/2023 07/04/23   Bensimhon, Toribio SAUNDERS, MD  mexiletine (MEXITIL ) 150 MG capsule Take  2 capsules (300 mg total) by mouth 2 (two) times daily. Patient not taking: Reported on 11/24/2023 08/08/23   Bensimhon, Toribio SAUNDERS, MD  Multiple Vitamin (MULTIVITAMIN WITH MINERALS) TABS tablet Take 1 tablet by mouth daily. Patient not taking: Reported on 11/24/2023 05/24/23   Amin, Sumayya, MD  naproxen  (NAPROSYN ) 500 MG tablet Take 1 tablet (500 mg total) by mouth 2 (two) times daily with a meal. Patient not taking: Reported on 11/24/2023 09/14/23   Viviann Pastor, MD  spironolactone  (ALDACTONE ) 25 MG tablet Take 1 tablet (25 mg total) by mouth daily. Patient not taking: Reported on 11/24/2023 09/14/23 12/13/23  Viviann Pastor, MD  torsemide  (DEMADEX ) 20 MG tablet Take 40 mg by mouth daily. Patient not taking: Reported on 11/24/2023 09/26/23   [provider]     Critical care time: 30 minutes     Lonell Moose, AGNP  Pulmonary/Critical Care Pager [phone] (please enter 7 digits) PCCM Consult Pager [phone] (please enter 7 digits)

## 2023-11-30 NOTE — Plan of Care (Signed)
  Problem: Clinical Measurements: Goal: Diagnostic test results will improve Outcome: Progressing Goal: Cardiovascular complication will be avoided Outcome: Progressing   Problem: Health Behavior/Discharge Planning: Goal: Ability to safely manage health-related needs after discharge will improve Outcome: Progressing

## 2023-11-30 NOTE — Plan of Care (Signed)
  Problem: Safety: Goal: Ability to remain free from injury will improve Outcome: Progressing   Problem: Education: Goal: Understanding of CV disease, CV risk reduction, and recovery process will improve Outcome: Progressing Goal: Individualized Educational Video(s) Outcome: Progressing   Problem: Coping: Goal: Ability to adjust to condition or change in health will improve Outcome: Progressing

## 2023-11-30 NOTE — Progress Notes (Signed)
 Patient ID: Philip Monroe, male   DOB: 06-22-1975, 48 y.o.   MRN: [id number]     Advanced Heart Failure Rounding Note  Cardiologist: None  Chief Complaint: CHF Subjective:    I/Os net negative 3040, creatinine 1.27.  Now off Lasix  gtt, CVP 8-9 this morning.  He is milrinone  0.25, co-ox 80%.    Gouty pain seems improved on prednisone .   LHC/RHC: Coronary Findings  Diagnostic Dominance: Right Left Anterior Descending  Mid LAD lesion is 40% stenosed.  Dist LAD lesion is 40% stenosed.    First Diagonal Branch  1st Diag lesion is 70% stenosed.    Ramus Intermedius  Ramus lesion is 90% stenosed.    Left Circumflex  Mid Cx lesion is 100% stenosed.    Right Coronary Artery  Mid RCA lesion is 60% stenosed.    Right Posterior Descending Artery  Collaterals  RPDA filled by collaterals from Dist LAD.    RPDA lesion is 100% stenosed.    Right Posterior Atrioventricular Artery  RPAV-1 lesion is 80% stenosed.  RPAV-2 lesion is 90% stenosed.    Intervention   No interventions have been documented.   Right Heart  Right Heart Pressures RHC Procedural Findings: Hemodynamics (mmHg) RA mean 17 RV 80/25 PA 83/53, mean 59 PCWP mean 27 LV 108/28 AO 109/80  Oxygen saturations: PA 49% AO 93%  Cardiac Output (Fick) 4.13  Cardiac Index (Fick) 1.6  PVR 7.7 WU  Cardiac Output (Thermo) 4.57  Cardiac Index (Thermo) 1.77 PVR 7 WU     Objective:   Weight Range: (!) 139.9 kg Body mass index is 38.55 kg/m.   Vital Signs:   Temp:  [97.6 F (36.4 C)-98.4 F (36.9 C)] 98 F (36.7 C) (09/18 0800) Pulse Rate:  [58-80] 66 (09/18 0700) Resp:  [11-26] 18 (09/18 0700) BP: (95-129)/(61-99) 106/61 (09/18 0850) SpO2:  [86 %-99 %] 87 % (09/18 0700) Weight:  [139.9 kg] 139.9 kg (09/18 0500) Last BM Date : 11/29/23  Weight change: Filed Weights   11/28/23 0500 11/29/23 0600 11/30/23 0500  Weight: 133.3 kg (!) 146.5 kg (!) 139.9 kg    Intake/Output:   Intake/Output  Summary (Last 24 hours) at 11/30/2023 1014 Last data filed at 11/30/2023 0616 Gross per 24 hour  Intake 1460.38 ml  Output 4500 ml  Net -3039.62 ml      Physical Exam    General: NAD Neck: JVP 8 cm, no thyromegaly or thyroid nodule.  Lungs: Clear to auscultation bilaterally with normal respiratory effort. CV: Lateral PMI.  Heart regular S1/S2, no S3/S4, no murmur.  No peripheral edema.   Abdomen: Soft, nontender, no hepatosplenomegaly, no distention.  Skin: Intact without lesions or rashes.  Neurologic: Alert and oriented x 3.  Psych: Normal affect. Extremities: No clubbing or cyanosis.  HEENT: Normal.   Telemetry   NSR with occasional PVCs, personally reviewed   Labs    CBC Recent Labs    11/28/23 0551 11/28/23 1306 11/29/23 0445 11/30/23 0409  WBC 10.0  --  12.5* 14.2*  NEUTROABS 4.6  --   --   --   HGB 12.6*   < > 13.2 14.5  HCT 40.1   < > 41.7 46.9  MCV 78.8*  --  78.4* 79.1*  PLT 377  --  404* 419*   < > = values in this interval not displayed.   Basic Metabolic Panel Recent Labs    90/82/74 0445 11/29/23 1849 11/30/23 0409  NA 132* 132* 132*  K 3.5  4.9 3.9  CL 96* 98 100  CO2 25 24 24   GLUCOSE 207* 118* 152*  BUN 21* 24* 25*  CREATININE 1.20 1.48* 1.27*  CALCIUM 8.6* 9.2 8.8*  MG 2.1  --  2.2  PHOS 2.9  --  3.7   Liver Function Tests Recent Labs    11/28/23 0550  AST 23  ALT 24  ALKPHOS 62  BILITOT 1.2  PROT 6.4*  ALBUMIN 2.9*   No results for input(s): LIPASE, AMYLASE in the last 72 hours. Cardiac Enzymes No results for input(s): CKTOTAL, CKMB, CKMBINDEX, TROPONINI in the last 72 hours.  BNP: BNP (last 3 results) Recent Labs    05/26/23 1048 09/25/23 0958 11/24/23 1618  BNP 204.2* 1,509.6* 1,700.4*    ProBNP (last 3 results) No results for input(s): PROBNP in the last 8760 hours.   D-Dimer No results for input(s): DDIMER in the last 72 hours. Hemoglobin A1C Recent Labs    11/29/23 0445  HGBA1C 6.6*    Fasting Lipid Panel No results for input(s): CHOL, HDL, LDLCALC, TRIG, CHOLHDL, LDLDIRECT in the last 72 hours. Thyroid Function Tests No results for input(s): TSH, T4TOTAL, T3FREE, THYROIDAB in the last 72 hours.  Invalid input(s): FREET3  Other results:   Imaging    No results found.    Medications:     Scheduled Medications:  allopurinol   100 mg Oral Daily   apixaban   5 mg Oral BID   Chlorhexidine  Gluconate Cloth  6 each Topical Daily   colchicine   0.6 mg Oral Daily   digoxin   0.125 mg Oral Daily   empagliflozin   10 mg Oral Daily   ezetimibe   10 mg Oral QHS   insulin  aspart  0-5 Units Subcutaneous QHS   insulin  aspart  0-9 Units Subcutaneous TID WC   mexiletine  300 mg Oral BID   multivitamin with minerals  1 tablet Oral Daily   predniSONE   40 mg Oral Q breakfast   sacubitril -valsartan   1 tablet Oral BID   sodium chloride  flush  3 mL Intravenous Q12H   sodium chloride  flush  3 mL Intravenous Q12H   spironolactone   25 mg Oral Daily   torsemide   40 mg Oral Daily    Infusions:  milrinone  0.125 mcg/kg/min (11/30/23 0847)    PRN Medications: acetaminophen , acetaminophen , HYDROcodone -acetaminophen , ondansetron  (ZOFRAN ) IV, ondansetron  (ZOFRAN ) IV, sodium chloride  flush, sodium chloride  flush, sodium chloride  flush   Assessment/Plan   1. Acute on chronic systolic heart failure, likely combined ischemic and nonischemic: Mentions of compaction CM from [facility] cards notes.  RHC 05/24 showed normal LV and RV filling pressures, mild pulmonary hypertension with mild elevation of PVR, low cardiac index 1.8.  Echo 03/25 with EF <20%, LV with GHK, LV severely dilated, RV mod reduced, LA severely dilated, mild-mod MR.  Echo this admission with EF < 20%, severe LV dilation, moderate RV enlargement with low normal systolic function, moderate MR, dilated IVC.  PVCs may also contribute, 20.8% on Zio monitor in 5/25.  He had run out of most medications and for the  last few weeks was only taking Eliquis , torsemide , and Jardiance .  The week prior to admission, he only took torsemide  twice due to gout pain and difficulty getting to the bathroom.  RHC showed markedly elevated filling pressures with low CI (1.6 Fick/1.77 thermo). Milrinone  0.25 and Lasix  gtt 12 mg/hr begun, Lasix  stopped last night with drop in CVP.  Creatinine 1.49 => 1.34 => 1.2 => 1.27. BP stable today. Co-ox 80% with CVP  8-9 today. Feels better.  - Start torsemide  40 mg daily.  - Decrease milrinone  to 0.125, hopefully stop tomorrow.     - Continue digoxin .  - Increase Entresto  to 49/51 bid.   - spironolactone  25 mg daily.  - Off Toprol  XL with low output.  - Continue Jardiance  10 mg daily.  - I will order cardiac MRI.  - With weight and smoking, not a candidate for heart transplant.  - With consistently depressed EF, he is an ICD candidate. QRS likely not wide enough to benefit from CRT.  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  Low cardiac output on RHC this admission.  Noncompliance with medications complicates situation. He is now on milrinone  and diuresing well, will try to wean off.  Though compliance is a concern, I think that he would be a potential LVAD candidate. If we can wean him off milrinone , will have him followup in LVAD clinic.  If we cannot wean him off, may directly transfer to [facility] for faster LVAD evaluation.  2. CAD:  LHC 5/24 at [facility] showed severe multivessel CAD including 80% mid RCA, 100% RPDA, 100% mid LCx, 80% OM1, 80% mid-distal LAD. Medical management recommended by [facility] cardiology 05/24. No chest pain.  Mild elevation in HS-TnI with no trend this admission is likely due to demand ischemia from volume overload. Coronary angiography this admission showed occluded PDA and mid AV LCx, 90% ramus, severe diffuse disease in right PLV, nonobstructive LAD disease.  With markedly low EF and without severe LAD disease, I do not think CABG is a good option.  - No ASA  given Eliquis  use and stable CAD.  - Continue Zetia , has not been able to tolerate statin.  Would refer to lipid clinic for Repatha as outpatient. Lp(a) is very elevated.  - Best future option will likely be LVAD.  3. PVCs: Zio 5/25 with 21% burden.  He did not tolerate amiodarone  well and was started on mexiletine. He has not been taking mexiletine recently (out of med x week). Few PVCs on telemetry despite milrinone  use.  - Restarted mexiletine 300 mg bid this admission.   - Will need sleep study as outpatient.  4. Morbid obesity: GLP-1 agonist would be ideal as outpatient.  5. Tobacco use: Smoking about 1/2 ppd.  6. 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  -  Continue Eliquis .  7. Gout: Very symptomatic, knees and ankles.  Has been on allopurinol  and colchicine .  Had not been taking torsemide  regularly at home so that he did not have to walk to the bathroom with gout pain.  - Added prednisone  burst for ongoing gouty pain, pain improved.    CRITICAL CARE Performed by: Ezra Shuck  Total critical care time: 35 minutes  Critical care time was exclusive of separately billable procedures and treating other patients.  Critical care was necessary to treat or prevent imminent or life-threatening deterioration.  Critical care was time spent personally by me on the following activities: development of treatment plan with patient and/or surrogate as well as nursing, discussions with consultants, evaluation of patient's response to treatment, examination of patient, obtaining history from patient or surrogate, ordering and performing treatments and interventions, ordering and review of laboratory studies, ordering and review of radiographic studies, pulse oximetry and re-evaluation of patient's condition.   Length of Stay: 6  Ezra Shuck, MD  11/30/2023, 10:14 AM  Advanced Heart Failure Team Pager [phone] (M-F;  7a - 5p)   Please contact [facility] for night-coverage after hours (5p -7a ) and weekends on [email or web address]

## 2023-12-01 ENCOUNTER — Telehealth (HOSPITAL_COMMUNITY): Payer: Self-pay | Admitting: *Deleted

## 2023-12-01 DIAGNOSIS — M109 Gout, unspecified: Secondary | ICD-10-CM

## 2023-12-01 DIAGNOSIS — I5023 Acute on chronic systolic (congestive) heart failure: Secondary | ICD-10-CM | POA: Diagnosis not present

## 2023-12-01 LAB — COOXEMETRY PANEL
Carboxyhemoglobin: 2.2 % — ABNORMAL HIGH (ref 0.5–1.5)
Carboxyhemoglobin: 2.3 % — ABNORMAL HIGH (ref 0.5–1.5)
Carboxyhemoglobin: 2.8 % — ABNORMAL HIGH (ref 0.5–1.5)
Methemoglobin: <0.7 % (ref 0.0–1.5)
Methemoglobin: 0.9 % (ref 0.0–1.5)
Methemoglobin: <0.7 % (ref 0.0–1.5)
O2 Saturation: 59.2 %
O2 Saturation: 64.3 %
O2 Saturation: 60.9 %
Total hemoglobin: 16.2 g/dL — ABNORMAL HIGH (ref 12.0–16.0)
Total hemoglobin: 16.5 g/dL — ABNORMAL HIGH (ref 12.0–16.0)
Total hemoglobin: 17.5 g/dL — ABNORMAL HIGH (ref 12.0–16.0)
Total oxygen content: 57.9 %
Total oxygen content: 62.2 %
Total oxygen content: 58.8 %

## 2023-12-01 LAB — BASIC METABOLIC PANEL WITH GFR
Anion gap: 13 (ref 5–15)
BUN: 26 mg/dL — ABNORMAL HIGH (ref 6–20)
CO2: 23 mmol/L (ref 22–32)
Calcium: 9.1 mg/dL (ref 8.9–10.3)
Chloride: 99 mmol/L (ref 98–111)
Creatinine, Ser: 1.32 mg/dL — ABNORMAL HIGH (ref 0.61–1.24)
GFR, Estimated: >60 mL/min (ref >60)
Glucose, Bld: 171 mg/dL — ABNORMAL HIGH (ref 70–99)
Potassium: 4.3 mmol/L (ref 3.5–5.1)
Sodium: 135 mmol/L (ref 135–145)

## 2023-12-01 LAB — CBC
HCT: 49 % (ref 39.0–52.0)
Hemoglobin: 15.5 g/dL (ref 13.0–17.0)
MCH: 24.8 pg — ABNORMAL LOW (ref 26.0–34.0)
MCHC: 31.6 g/dL (ref 30.0–36.0)
MCV: 78.5 fL — ABNORMAL LOW (ref 80.0–100.0)
Platelets: 431 K/uL — ABNORMAL HIGH (ref 150–400)
RBC: 6.24 MIL/uL — ABNORMAL HIGH (ref 4.22–5.81)
RDW: 18.8 % — ABNORMAL HIGH (ref 11.5–15.5)
WBC: 15 K/uL — ABNORMAL HIGH (ref 4.0–10.5)
nRBC: 0 % (ref 0.0–0.2)

## 2023-12-01 LAB — GLUCOSE, CAPILLARY
Glucose-Capillary: 104 mg/dL — ABNORMAL HIGH (ref 70–99)
Glucose-Capillary: 142 mg/dL — ABNORMAL HIGH (ref 70–99)
Glucose-Capillary: 137 mg/dL — ABNORMAL HIGH (ref 70–99)
Glucose-Capillary: 125 mg/dL — ABNORMAL HIGH (ref 70–99)

## 2023-12-01 LAB — PHOSPHORUS: Phosphorus: 3.7 mg/dL (ref 2.5–4.6)

## 2023-12-01 LAB — DIGOXIN LEVEL: Digoxin Level: <0.4 ng/mL — ABNORMAL LOW (ref 0.8–2.0)

## 2023-12-01 LAB — MAGNESIUM: Magnesium: 2.1 mg/dL (ref 1.7–2.4)

## 2023-12-01 MED ORDER — FUROSEMIDE 10 MG/ML IJ SOLN
80.0000 mg | Freq: Once | INTRAMUSCULAR | Status: AC
  Administered 2023-12-01: 80 mg via INTRAVENOUS
  Filled 2023-12-01: qty 8

## 2023-12-01 MED ORDER — TORSEMIDE 20 MG PO TABS
40.0000 mg | ORAL_TABLET | Freq: Every day | ORAL | Status: DC
  Administered 2023-12-02 – 2023-12-04 (×3): 40 mg via ORAL
  Filled 2023-12-01 (×3): qty 2

## 2023-12-01 MED ORDER — ORAL CARE MOUTH RINSE: 15.0000 mL | OROMUCOSAL | Status: DC | PRN

## 2023-12-01 NOTE — Telephone Encounter (Signed)
 Pt returned call to VAD clinic. Confirmed he is aware of VAD consult appt 12/08/23 at 10:00 at [facility].   Provided directions to clinic. Pt does not have access to MyChart currently for appt information. Address/phone number/appt information provided on AVS as pt currently hospitalized.   Isaiah Knoll RN VAD Coordinator  Office: [phone]  24/7 Pager: [phone]

## 2023-12-01 NOTE — Progress Notes (Signed)
 NAME:  Philip Monroe, MRN:  [id number], DOB:  Apr 25, 1975, LOS: 7 ADMISSION DATE:  11/24/2023, CONSULTATION DATE:  11/28/2023 REFERRING MD:  Dr. Rolan, CHIEF COMPLAINT:  Advanced Heart Failure    Brief Pt Description / Synopsis:  48 year old male with ischemic and non-ischemic cardiomyopathy presenting with acute decompensated heart failure. Philip Monroe underwent RHC showing advanced heart failure with markedly decreased CO/CI requiring ionotropic support with good diuresis and improvement.   History of Present Illness:  The patient is a 48 year old male with past medical history of heart failure and coronary artery disease as well as CKD who presents to the hospital with increased shortness of breath.     Patient presented on 9/12 complaining of worsening shortness of breath of 2 weeks in duration with associated lower extremity swelling.  Philip Monroe also reports knee and ankle pain. Philip Monroe denies having any chest pain and denies having any infectious symptoms.  On presentation to the ED, blood work was notable for a BNP of 1700 prompting admission to the hospital for IV diuresis.   Patient admitted to the hospitalist service and initiated on IV furosemide  and cardiology was consulted. TTE showed marked drop in EF and LV dilation, worse from prior. Philip Monroe was seen by Dr. Rolan on 9/15 in consultation given his complex history of cardiomyopathy with low cardiac output and cardiac index and multiple hospitalizations for decompensated heart failure.  It appears that the patient is not compliant with all his medications.  Given all this, Philip Monroe underwent right and left heart cath today and is admitted to the ICU for ionotropic support.   -RHC 08/2017: RA 6, RV 49/7, PA 50/25 (34), PCWP 19, CO/CI 7.6/2.9, SVR 1158 dynes, PVR 1.9 wood, LVEDP 22 mmHG -RHC 07/2022: RA 4, RV 38/3, PA 43/18 (26), PCWP 11, CO/CI 5.3/1.8, PVR 2.83 wood -LHC 07/2022: multivessel disease with 80% occluded mid RCA, 100% occluded RPDA, 100% occluded mid left  circumflex, 80% occluded OM1, and 80% occluded mid distal LAD.   -RHC 11/28/2022: RA 17, RV 80/25, PA 83/53 (59), PCWP 27, CO/CI 4.13/1.6, PVR 7.7 wood  -LHC 11/28/2023: mid LAD 40%, distal LAD 40%, first Diag 70%, Ramus 90%, Lcx 100%, RCA 60%, RPDA 100% with collaterals from LAD, RPAV1 80% and RPAV 2 90%  Please see Significant Hospital Events section below for full detailed hospital course.   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 Gout Morbid obesity Tobacco abuse History of apical mural thrombus  Micro Data:  9/16: MRSA PCR>> negative   Antimicrobials:   Anti-infectives (From admission, onward)    None       Significant Hospital Events: Including procedures, antibiotic start and stop dates in addition to other pertinent events   09/12: Admitted to the telemetry unit with acute on chronic combined systolic diastolic CHF and apical mural thrombus  09/16: RHC/LHC revealed severely elevated left and right-sided filling pressures, 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  Pt transferred to ICU for milrinone  and lasix  gtts.  09/17: Pt remains on furosemide  gtt @12  mg/hr and milrinone  gtt @0 .25 mcg/kg/min.  Overnight UOP 2.8L and since admission pt -8.9L.  Per heart failure team recommendations lasix  gtt discontinued 2000 09/18: Heart failure team recommending weaning of milrinone  gtt today.  MR Cardiac Morphology W WO Contrast pending.  UOP overnight 550  ml and since admission -12L.  Total oxygen content 80.9.  If pt remains off milrinone  gtt will transfer to telemetry unit and transfer service to [facility]. 09/19: Coox decreased to 59 from 83.5 after Milrinone  stopped, was restarted on Milrinone . Not currently requiring vasopressors.  Per  HF team, plan to again hold Milrinone  and repeat Coox, may ultimately require transfer to [facility] for LVAD consideration.  Renal function and urine output stable.   Interim History / Subjective:  As outlined above under Significant Hospital Events section  Objective   Blood pressure (!) 89/68, pulse (!) 35, temperature 99.4 F (37.4 C), temperature source Oral, resp. rate (!) 23, height 6' 3 (1.905 m), weight (!) 136.5 kg, SpO2 91%. CVP:  [2 mmHg-11 mmHg] 2 mmHg      Intake/Output Summary (Last 24 hours) at 12/01/2023 9166 Last data filed at 12/01/2023 0700 Gross per 24 hour  Intake 844.96 ml  Output 1650 ml  Net -805.04 ml   Filed Weights   11/29/23 0600 11/30/23 0500 12/01/23 0446  Weight: (!) 146.5 kg (!) 139.9 kg (!) 136.5 kg    Examination: General: Well-developed-well nourished male, in NAD  HENT: Supple, mild JVD  Lungs: Clear throughout, even, non labored, normal effort Cardiovascular: NSR, s1s2, no r/g, 2+ radial/1+ distal pulses, trace bilateral lower extremity edema  Abdomen: +BS x4, obese, non distended, non tender  Extremities: Normal bulk and tone, moves all extremities  Neuro: Alert and oriented, follows commands, no focal deficits, speech clear, PERRLA  GU: Voiding   Resolved Hospital Problem list     Assessment & Plan:   #Acute Decompensated HFrEF (ACC/AHA D/NYHA III) #Apical Mural Thrombus PMHx: HTN, CAD Echocardiogram 11/26/23: LVEF <20%, indeterminate diastolic parameters, RV systolic function low normal, RV moderately enlarged, moderate Mitral Regurgitation  -Continuous cardiac monitoring -Maintain MAP >65 -Vasopressors as needed to maintain MAP goal -Advanced Heart Failure following, appreciate input -Continue Milrinone  as per HF team ~ follow Coox  ~ plan to recheck Coox later this afternoon and if >55% can stay off Milrinone ; may ultimately require transfer to [facility] for consideration of LVAD -Lactic acid is normalized -HS Troponin peaked  at 76 -Diuresis as BP and renal function permits ~ Lasix  gtt discontinued 9/17 ~ given 80 mg IV x1 dose on 9/19 -Continue Entresto , Mexitil , and Aldactone   -Continue Eliquis   -Cardiac MRI performed 9/18  #CKD Stage II #Mild Hyponatremia ~ RESOLVED  -Monitor I&O's / urinary output -Follow BMP -Ensure adequate renal perfusion -Avoid nephrotoxic agents as able -Replace electrolytes as indicated ~ Pharmacy following for assistance with electrolyte replacement  #Hyperglycemia  -Hgb A1c: 6.6 -CBG's q4h; Target range of 140 to 180 -SSI -Follow ICU Hypo/Hyperglycemia protocol      Best Practice (right click and Reselect all SmartList Selections daily)   Diet/type: Regular consistency (see orders) DVT prophylaxis: DOAC GI prophylaxis: N/A Lines: Central line and yes and it is still needed Foley:  N/A Code Status:  full code Last date of multidisciplinary goals of care discussion [9/19]  9/19: Pt updated at bedside on plan of care.  Labs   CBC: Recent Labs  Lab 11/26/23 0425 11/27/23 0318 11/28/23 0551 11/28/23 1306 11/28/23 1315 11/29/23 0445 11/30/23 0409 12/01/23 0350  WBC 10.7* 9.5 10.0  --   --  12.5* 14.2* 15.0*  NEUTROABS 5.7 5.2 4.6  --   --   --   --   --   HGB 12.7* 12.5* 12.6* 14.6  13.9 13.9 13.2 14.5 15.5  HCT  40.4 40.9 40.1 43.0  41.0 41.0 41.7 46.9 49.0  MCV 78.6* 80.0 78.8*  --   --  78.4* 79.1* 78.5*  PLT 381 392 377  --   --  404* 419* 431*    Basic Metabolic Panel: Recent Labs  Lab 11/28/23 0551 11/28/23 1306 11/28/23 1315 11/28/23 2131 11/29/23 0445 11/29/23 1849 11/30/23 0409 12/01/23 0350  NA 135   < > 136  --  132* 132* 132* 135  K 3.8   < > 3.8 4.3 3.5 4.9 3.9 4.3  CL 96*  --   --   --  96* 98 100 99  CO2 27  --   --   --  25 24 24 23   GLUCOSE 91  --   --   --  207* 118* 152* 171*  BUN 26*  --   --   --  21* 24* 25* 26*  CREATININE 1.34*  --   --   --  1.20 1.48* 1.27* 1.32*  CALCIUM 8.6*  --   --   --  8.6* 9.2 8.8* 9.1  MG   --   --   --  2.1 2.1  --  2.2 2.1  PHOS  --   --   --   --  2.9  --  3.7 3.7   < > = values in this interval not displayed.   GFR: Estimated Creatinine Clearance: 103 mL/min (A) (by C-G formula based on SCr of 1.32 mg/dL (H)). Recent Labs  Lab 11/28/23 0551 11/28/23 1722 11/29/23 0445 11/30/23 0409 12/01/23 0350  WBC 10.0  --  12.5* 14.2* 15.0*  LATICACIDVEN  --  1.6  --   --   --     Liver Function Tests: Recent Labs  Lab 11/24/23 1618 11/28/23 0550  AST 23 23  ALT 20 24  ALKPHOS 64 62  BILITOT 1.8* 1.2  PROT 6.9 6.4*  ALBUMIN 3.1* 2.9*   No results for input(s): LIPASE, AMYLASE in the last 168 hours. No results for input(s): AMMONIA in the last 168 hours.  ABG    Component Value Date/Time   PHART 7.423 11/28/2023 1315   PCO2ART 36.2 11/28/2023 1315   PO2ART 72 (L) 11/28/2023 1315   HCO3 23.7 11/28/2023 1315   TCO2 25 11/28/2023 1315   O2SAT 59.2 12/01/2023 0411     Coagulation Profile: No results for input(s): INR, PROTIME in the last 168 hours.  Cardiac Enzymes: No results for input(s): CKTOTAL, CKMB, CKMBINDEX, TROPONINI in the last 168 hours.  HbA1C: Hgb A1c MFr Bld  Date/Time Value Ref Range Status  11/29/2023 04:45 AM 6.6 (H) 4.8 - 5.6 % Final    Comment:    (NOTE) Diagnosis of Diabetes The following HbA1c ranges recommended by the American Diabetes Association (ADA) may be used as an aid in the diagnosis of diabetes mellitus.  Hemoglobin             Suggested A1C NGSP%              Diagnosis  <5.7                   Non Diabetic  5.7-6.4                Pre-Diabetic  >6.4                   Diabetic  <7.0  Glycemic control for                       adults with diabetes.      CBG: Recent Labs  Lab 11/30/23 1114 11/30/23 1210 11/30/23 1612 11/30/23 2149 12/01/23 0747  GLUCAP 198* 150* 211* 113* 104*    Review of Systems:   Positives in BOLD: Gen: Denies fever, chills, weight change, fatigue,  night sweats HEENT: Denies blurred vision, double vision, hearing loss, tinnitus, sinus congestion, rhinorrhea, sore throat, neck stiffness, dysphagia PULM: Denies shortness of breath, cough, sputum production, hemoptysis, wheezing CV: Denies chest pain, edema, orthopnea, paroxysmal nocturnal dyspnea, palpitations GI: Denies abdominal pain, nausea, vomiting, diarrhea, hematochezia, melena, constipation, change in bowel habits GU: Denies dysuria, hematuria, polyuria, oliguria, urethral discharge Endocrine: Denies hot or cold intolerance, polyuria, polyphagia or appetite change Derm: Denies rash, dry skin, scaling or peeling skin change Heme: Denies easy bruising, bleeding, bleeding gums Neuro: Denies headache, numbness, weakness, slurred speech, loss of memory or consciousness   Past Medical History:  Philip Monroe,  has a past medical history of Aortic root dilatation (HCC), CAD (coronary artery disease), CHF (congestive heart failure) (HCC), Chronic HFrEF (heart failure with reduced ejection fraction) (HCC), CKD (chronic kidney disease), stage II, Gout, Hyperlipidemia LDL goal <70, Hypertension, Ischemic cardiomyopathy, IVCD (intraventricular conduction defect), Left ventricular noncompaction (HCC), Morbid obesity (HCC), NSVT (nonsustained ventricular tachycardia) (HCC), PVC's (premature ventricular contractions), Statin intolerance, and Tobacco abuse.   Surgical History:   Past Surgical History:  Procedure Laterality Date   RIGHT/LEFT HEART CATH AND CORONARY ANGIOGRAPHY N/A 11/28/2023   Procedure: RIGHT/LEFT HEART CATH AND CORONARY ANGIOGRAPHY;  Surgeon: Rolan Ezra RAMAN, MD;  Location: [facility] INVASIVE CV LAB;  Service: Cardiovascular;  Laterality: N/A;     Social History:   reports that Philip Monroe has been smoking cigarettes. Philip Monroe has never used smokeless tobacco. Philip Monroe reports that Philip Monroe does not currently use alcohol after a past usage of about 36.0 standard drinks of alcohol per week. Philip Monroe reports that Philip Monroe does not use  drugs.   Family History:  His family history is not on file.   Allergies Allergies  Allergen Reactions   Lactose Intolerance (Gi) Diarrhea   Statins     myalgias     Home Medications  Prior to Admission medications   Medication Sig Start Date End Date Taking? Authorizing Provider  colchicine  0.6 MG tablet Take 1 tablet (0.6 mg total) by mouth 2 (two) times daily. 09/26/23  Yes Caleen Qualia, MD  allopurinol  (ZYLOPRIM ) 100 MG tablet Take 1 tablet (100 mg total) by mouth daily. Patient not taking: Reported on 11/24/2023 09/14/23 12/13/23  Viviann Pastor, MD  aspirin  81 MG chewable tablet Chew 1 tablet (81 mg total) by mouth daily. Patient not taking: Reported on 11/24/2023 05/24/23   Amin, Sumayya, MD  cyanocobalamin  (VITAMIN B12) 1000 MCG tablet Take 1 tablet (1,000 mcg total) by mouth daily. Patient not taking: Reported on 11/24/2023 05/24/23   Amin, Sumayya, MD  ELIQUIS  5 MG TABS tablet Take 1 tablet (5 mg total) by mouth 2 (two) times daily. Patient not taking: Reported on 11/24/2023 09/14/23 12/13/23  Viviann Pastor, MD  empagliflozin  (JARDIANCE ) 10 MG TABS tablet Take 1 tablet (10 mg total) by mouth daily. Patient not taking: Reported on 11/24/2023 09/14/23 12/13/23  Viviann Pastor, MD  ezetimibe  (ZETIA ) 10 MG tablet Take 1 tablet (10 mg total) by mouth at bedtime. Patient not taking: Reported on 11/24/2023 09/14/23 12/13/23  Viviann Pastor, MD  HYDROcodone -acetaminophen  (  NORCO/VICODIN) 5-325 MG tablet Take 1 tablet by mouth every 4 (four) hours as needed. Patient not taking: Reported on 11/24/2023 06/21/23   Margrette Monte A, PA-C  losartan  (COZAAR ) 25 MG tablet Take 1 tablet (25 mg total) by mouth daily. Patient not taking: Reported on 11/24/2023 06/22/23   Donette Ellouise LABOR, FNP  metoprolol  succinate (TOPROL -XL) 25 MG 24 hr tablet Take 1 tablet (25 mg total) by mouth daily. Patient not taking: Reported on 11/24/2023 07/04/23   Bensimhon, Toribio SAUNDERS, MD  mexiletine (MEXITIL ) 150 MG capsule Take  2 capsules (300 mg total) by mouth 2 (two) times daily. Patient not taking: Reported on 11/24/2023 08/08/23   Bensimhon, Toribio SAUNDERS, MD  Multiple Vitamin (MULTIVITAMIN WITH MINERALS) TABS tablet Take 1 tablet by mouth daily. Patient not taking: Reported on 11/24/2023 05/24/23   Amin, Sumayya, MD  naproxen  (NAPROSYN ) 500 MG tablet Take 1 tablet (500 mg total) by mouth 2 (two) times daily with a meal. Patient not taking: Reported on 11/24/2023 09/14/23   Viviann Pastor, MD  spironolactone  (ALDACTONE ) 25 MG tablet Take 1 tablet (25 mg total) by mouth daily. Patient not taking: Reported on 11/24/2023 09/14/23 12/13/23  Viviann Pastor, MD  torsemide  (DEMADEX ) 20 MG tablet Take 40 mg by mouth daily. Patient not taking: Reported on 11/24/2023 09/26/23   [provider]     Critical care time: 40 minutes     Inge Lecher, AGACNP-BC [facility] Prefer epic messenger for cross cover needs If after hours, please call E-link

## 2023-12-01 NOTE — Progress Notes (Addendum)
 Patient ID: Philip Monroe, male   DOB: [birth year], 48 y.o.   MRN: [id number]     Advanced Heart Failure Rounding Note  Cardiologist: None  Chief Complaint: CHF Subjective:    Weight trending down, creatinine 1.27 => 1.32.  He had Lasix  80 mg IV x 1 this morning, CVP now 5. Co-ox 59% early am, repeated 64%.  He is on milrinone  0.125.  says he feels ok.     Gouty pain seems improved on prednisone .   Cardiac MRI (technically difficult study, had a hard time with breath-holding).  1. Severely dilated left ventricle with normal wall thickness. Diffuse hypokinesis with akinesis of the mid inferior and inferolateral walls, LV EF 14%. Prominent trabeculations but not quite reaching the MRI definition of noncompaction (noncompacted/compacted radio 2.3). 2.  Normal RV size with RV EF 29%. 3. On delayed enhancement imaging, there was evidence for prior myocardial infarction in the mid inferior/mid inferolateral walls, suspect this area is nonviable. There is mid-wall LGE in the basal septum, suggesting a possible nonischemic cardiomyopathic process. 4. Extracellular volume percentage 34%, suggesting elevated myocardial fibrotic content.  LHC/RHC: Coronary Findings  Diagnostic Dominance: Right Left Anterior Descending  Mid LAD lesion is 40% stenosed.  Dist LAD lesion is 40% stenosed.    First Diagonal Branch  1st Diag lesion is 70% stenosed.    Ramus Intermedius  Ramus lesion is 90% stenosed.    Left Circumflex  Mid Cx lesion is 100% stenosed.    Right Coronary Artery  Mid RCA lesion is 60% stenosed.    Right Posterior Descending Artery  Collaterals  RPDA filled by collaterals from Dist LAD.    RPDA lesion is 100% stenosed.    Right Posterior Atrioventricular Artery  RPAV-1 lesion is 80% stenosed.  RPAV-2 lesion is 90% stenosed.    Intervention   No interventions have been documented.   Right Heart  Right Heart Pressures RHC Procedural Findings: Hemodynamics  (mmHg) RA mean 17 RV 80/25 PA 83/53, mean 59 PCWP mean 27 LV 108/28 AO 109/80  Oxygen saturations: PA 49% AO 93%  Cardiac Output (Fick) 4.13  Cardiac Index (Fick) 1.6  PVR 7.7 WU  Cardiac Output (Thermo) 4.57  Cardiac Index (Thermo) 1.77 PVR 7 WU     Objective:   Weight Range: (!) 136.5 kg Body mass index is 37.61 kg/m.   Vital Signs:   Temp:  [98 F (36.7 C)-99.4 F (37.4 C)] 99.4 F (37.4 C) (09/19 0121) Pulse Rate:  [32-104] 64 (09/19 1000) Resp:  [11-26] 23 (09/19 0402) BP: (78-136)/(62-108) 100/76 (09/19 1000) SpO2:  [90 %-100 %] 92 % (09/19 1000) Weight:  [136.5 kg] 136.5 kg (09/19 0446) Last BM Date : 11/30/23  Weight change: Filed Weights   11/29/23 0600 11/30/23 0500 12/01/23 0446  Weight: (!) 146.5 kg (!) 139.9 kg (!) 136.5 kg    Intake/Output:   Intake/Output Summary (Last 24 hours) at 12/01/2023 1022 Last data filed at 12/01/2023 1000 Gross per 24 hour  Intake 739.82 ml  Output 2150 ml  Net -1410.18 ml      Physical Exam    General: NAD Neck: No JVD, no thyromegaly or thyroid nodule.  Lungs: Clear to auscultation bilaterally with normal respiratory effort. CV: Nondisplaced PMI.  Heart regular S1/S2, no S3/S4, no murmur.  No peripheral edema.   Abdomen: Soft, nontender, no hepatosplenomegaly, no distention.  Skin: Intact without lesions or rashes.  Neurologic: Alert and oriented x 3.  Psych: Normal affect. Extremities: No clubbing or cyanosis.  HEENT: Normal.   Telemetry   NSR with occasional PVCs, personally reviewed   Labs    CBC Recent Labs    11/30/23 0409 12/01/23 0350  WBC 14.2* 15.0*  HGB 14.5 15.5  HCT 46.9 49.0  MCV 79.1* 78.5*  PLT 419* 431*   Basic Metabolic Panel Recent Labs    90/81/74 0409 12/01/23 0350  NA 132* 135  K 3.9 4.3  CL 100 99  CO2 24 23  GLUCOSE 152* 171*  BUN 25* 26*  CREATININE 1.27* 1.32*  CALCIUM 8.8* 9.1  MG 2.2 2.1  PHOS 3.7 3.7   Liver Function Tests No results for  input(s): AST, ALT, ALKPHOS, BILITOT, PROT, ALBUMIN in the last 72 hours.  No results for input(s): LIPASE, AMYLASE in the last 72 hours. Cardiac Enzymes No results for input(s): CKTOTAL, CKMB, CKMBINDEX, TROPONINI in the last 72 hours.  BNP: BNP (last 3 results) Recent Labs    05/26/23 1048 09/25/23 0958 11/24/23 1618  BNP 204.2* 1,509.6* 1,700.4*    ProBNP (last 3 results) No results for input(s): PROBNP in the last 8760 hours.   D-Dimer No results for input(s): DDIMER in the last 72 hours. Hemoglobin A1C Recent Labs    11/29/23 0445  HGBA1C 6.6*   Fasting Lipid Panel No results for input(s): CHOL, HDL, LDLCALC, TRIG, CHOLHDL, LDLDIRECT in the last 72 hours. Thyroid Function Tests No results for input(s): TSH, T4TOTAL, T3FREE, THYROIDAB in the last 72 hours.  Invalid input(s): FREET3  Other results:   Imaging    MR CARDIAC VELOCITY FLOW MAP Result Date: 12/01/2023 CLINICAL DATA:  Cardiomyopathy EXAM: CARDIAC MRI TECHNIQUE: The patient was scanned on a 1.5 Tesla GE magnet. A dedicated cardiac coil was used. Functional imaging was done using Fiesta sequences. 2,3, and 4 chamber views were done to assess for RWMA's. Modified Simpson's rule using a short axis stack was used to calculate an ejection fraction on a dedicated work [employer]. The patient received 10 cc of Gadavist . After 10 minutes inversion recovery sequences were used to assess for infiltration and scar tissue. FINDINGS: Limited images of the lung fields showed no gross abnormalities. Technically difficult images due to trouble with breath-holding. Severely dilated left ventricle with normal wall thickness. Diffuse hypokinesis with akinesis of the mid inferior and inferolateral walls, LV EF 14%. Prominent trabeculations towards apex, end diastolic noncompacted/compacted ratio measured 1.8 - 2.2. Normal right ventricular size with RV EF 29%.  Severe left atrial enlargement, moderate right atrial enlargement. Trileaflet aortic valve with no significant stenosis or regurgitation. Visually, mitral regurgitation looks moderate. Regurgitant fraction not calculated as aortic flow sequence has significant artifact. On delayed enhancement imaging, there was mid-wall late gadolinium enhancement (LGE) in the basal septum. There was >50% mid-wall LGE in the mid inferior and mid inferolateral walls. MEASUREMENTS: MEASUREMENTS LVEDV 631 mL LVEDVi 232 mL/m2 LVSV 88 mL LVEF 14% RVEDV 231 mL RVEDVi 85 mL/m2 RVSV 68 mL RVEF 29% Due to artifact, I do not think that flow sequences are accurate. Global T1 1012, ECV 34% IMPRESSION: 1. Severely dilated left ventricle with normal wall thickness. Diffuse hypokinesis with akinesis of the mid inferior and inferolateral walls, LV EF 14%. Prominent trabeculations but not quite reaching the MRI definition of noncompaction (noncompacted/compacted radio 2.3). 2.  Normal RV size with RV EF 29%. 3. On delayed enhancement imaging, there was evidence for prior myocardial infarction in the mid inferior/mid inferolateral walls, suspect this area is nonviable. There is mid-wall LGE in the basal  septum, suggesting a possible nonischemic cardiomyopathic process. 4. Extracellular volume percentage 34%, suggesting elevated myocardial fibrotic content. Suspect mixed ischemic/nonischemic cardiomyopathy. Gaylynn Seiple Electronically Signed   By: Ezra Shuck M.D.   On: 12/01/2023 07:54   MR CARDIAC VELOCITY FLOW MAP Result Date: 12/01/2023 CLINICAL DATA:  Cardiomyopathy EXAM: CARDIAC MRI TECHNIQUE: The patient was scanned on a 1.5 Tesla GE magnet. A dedicated cardiac coil was used. Functional imaging was done using Fiesta sequences. 2,3, and 4 chamber views were done to assess for RWMA's. Modified Simpson's rule using a short axis stack was used to calculate an ejection fraction on a dedicated work [employer] officer, [employer]. The patient  received 10 cc of Gadavist . After 10 minutes inversion recovery sequences were used to assess for infiltration and scar tissue. FINDINGS: Limited images of the lung fields showed no gross abnormalities. Technically difficult images due to trouble with breath-holding. Severely dilated left ventricle with normal wall thickness. Diffuse hypokinesis with akinesis of the mid inferior and inferolateral walls, LV EF 14%. Prominent trabeculations towards apex, end diastolic noncompacted/compacted ratio measured 1.8 - 2.2. Normal right ventricular size with RV EF 29%. Severe left atrial enlargement, moderate right atrial enlargement. Trileaflet aortic valve with no significant stenosis or regurgitation. Visually, mitral regurgitation looks moderate. Regurgitant fraction not calculated as aortic flow sequence has significant artifact. On delayed enhancement imaging, there was mid-wall late gadolinium enhancement (LGE) in the basal septum. There was >50% mid-wall LGE in the mid inferior and mid inferolateral walls. MEASUREMENTS: MEASUREMENTS LVEDV 631 mL LVEDVi 232 mL/m2 LVSV 88 mL LVEF 14% RVEDV 231 mL RVEDVi 85 mL/m2 RVSV 68 mL RVEF 29% Due to artifact, I do not think that flow sequences are accurate. Global T1 1012, ECV 34% IMPRESSION: 1. Severely dilated left ventricle with normal wall thickness. Diffuse hypokinesis with akinesis of the mid inferior and inferolateral walls, LV EF 14%. Prominent trabeculations but not quite reaching the MRI definition of noncompaction (noncompacted/compacted radio 2.3). 2.  Normal RV size with RV EF 29%. 3. On delayed enhancement imaging, there was evidence for prior myocardial infarction in the mid inferior/mid inferolateral walls, suspect this area is nonviable. There is mid-wall LGE in the basal septum, suggesting a possible nonischemic cardiomyopathic process. 4. Extracellular volume percentage 34%, suggesting elevated myocardial fibrotic content. Suspect mixed ischemic/nonischemic  cardiomyopathy. Daena Alper Electronically Signed   By: Ezra Shuck M.D.   On: 12/01/2023 07:54   MR CARDIAC MORPHOLOGY W WO CONTRAST Result Date: 12/01/2023 CLINICAL DATA:  Cardiomyopathy EXAM: CARDIAC MRI TECHNIQUE: The patient was scanned on a 1.5 Tesla GE magnet. A dedicated cardiac coil was used. Functional imaging was done using Fiesta sequences. 2,3, and 4 chamber views were done to assess for RWMA's. Modified Simpson's rule using a short axis stack was used to calculate an ejection fraction on a dedicated work [employer] officer, [employer]. The patient received 10 cc of Gadavist . After 10 minutes inversion recovery sequences were used to assess for infiltration and scar tissue. FINDINGS: Limited images of the lung fields showed no gross abnormalities. Technically difficult images due to trouble with breath-holding. Severely dilated left ventricle with normal wall thickness. Diffuse hypokinesis with akinesis of the mid inferior and inferolateral walls, LV EF 14%. Prominent trabeculations towards apex, end diastolic noncompacted/compacted ratio measured 1.8 - 2.2. Normal right ventricular size with RV EF 29%. Severe left atrial enlargement, moderate right atrial enlargement. Trileaflet aortic valve with no significant stenosis or regurgitation. Visually, mitral regurgitation looks moderate. Regurgitant fraction not calculated as aortic  flow sequence has significant artifact. On delayed enhancement imaging, there was mid-wall late gadolinium enhancement (LGE) in the basal septum. There was >50% mid-wall LGE in the mid inferior and mid inferolateral walls. MEASUREMENTS: MEASUREMENTS LVEDV 631 mL LVEDVi 232 mL/m2 LVSV 88 mL LVEF 14% RVEDV 231 mL RVEDVi 85 mL/m2 RVSV 68 mL RVEF 29% Due to artifact, I do not think that flow sequences are accurate. Global T1 1012, ECV 34% IMPRESSION: 1. Severely dilated left ventricle with normal wall thickness. Diffuse hypokinesis with akinesis of the mid inferior and  inferolateral walls, LV EF 14%. Prominent trabeculations but not quite reaching the MRI definition of noncompaction (noncompacted/compacted radio 2.3). 2.  Normal RV size with RV EF 29%. 3. On delayed enhancement imaging, there was evidence for prior myocardial infarction in the mid inferior/mid inferolateral walls, suspect this area is nonviable. There is mid-wall LGE in the basal septum, suggesting a possible nonischemic cardiomyopathic process. 4. Extracellular volume percentage 34%, suggesting elevated myocardial fibrotic content. Suspect mixed ischemic/nonischemic cardiomyopathy. Niralya Ohanian Electronically Signed   By: Ezra Shuck M.D.   On: 12/01/2023 07:54      Medications:     Scheduled Medications:  allopurinol   100 mg Oral Daily   apixaban   5 mg Oral BID   Chlorhexidine  Gluconate Cloth  6 each Topical Daily   colchicine   0.6 mg Oral Daily   digoxin   0.125 mg Oral Daily   empagliflozin   10 mg Oral Daily   ezetimibe   10 mg Oral QHS   insulin  aspart  0-5 Units Subcutaneous QHS   insulin  aspart  0-9 Units Subcutaneous TID WC   mexiletine  300 mg Oral BID   multivitamin with minerals  1 tablet Oral Daily   predniSONE   40 mg Oral Q breakfast   sacubitril -valsartan   1 tablet Oral BID   sodium chloride  flush  3 mL Intravenous Q12H   sodium chloride  flush  3 mL Intravenous Q12H   spironolactone   25 mg Oral Daily   [START ON 12/02/2023] torsemide   40 mg Oral Daily    Infusions:    PRN Medications: acetaminophen , acetaminophen , alum & mag hydroxide-simeth, HYDROcodone -acetaminophen , ondansetron  (ZOFRAN ) IV, mouth rinse, sodium chloride  flush, sodium chloride  flush, sodium chloride  flush   Assessment/Plan   1. 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  RHC 05/24 showed normal LV and RV filling pressures, mild pulmonary hypertension with mild elevation of PVR, low cardiac index 1.8.  Echo  03/25 with EF <20%, LV with GHK, LV severely dilated, RV mod reduced, LA severely dilated, mild-mod MR.  Echo this admission with EF < 20%, severe LV dilation, moderate RV enlargement with low normal systolic function, moderate MR, dilated IVC.  PVCs may also contribute, 20.8% on Zio monitor in 5/25.  He had run out of most medications and for the last few weeks was only taking Eliquis , torsemide , and Jardiance .  The week prior to admission, he only took torsemide  twice due to gout pain and difficulty getting to the bathroom.  RHC showed markedly elevated filling pressures with low CI (1.6 Fick/1.77 thermo). Cardiac MRI showed LV EF 14%, RV EF 29%, suspect mixed ischemic/nonischemic cardiomyopathy with scarring process due to MI in the mid inferior/inferolateral wall and non-coronary type mid-wall LGE in the basal septum.  Milrinone  0.25 and Lasix  gtt 12 mg/hr begun, Lasix  stopped with drop in CVP.  He had 1 dose of IV Lasix  this morning.  Creatinine 1.49 =>  1.34 => 1.2 => 1.27 => 1.32. BP stable today. Co-ox 64% on milrinone  0.125, CVP now 5.  - No more IV Lasix .  - Start torsemide  40 mg daily tomorrow as long as creatinine stable.  - Stop milrinone , recheck co-ox this afternoon.  If co-ox 55% and above, can stay off.    - Continue digoxin .  - Continue Entresto  49/51 bid.   - spironolactone  25 mg daily.  - Off Toprol  XL with low output.  - Continue Jardiance  10 mg daily.  - With weight and smoking, not a candidate for heart transplant.  - With consistently depressed EF, he is an ICD candidate. QRS likely not wide enough to benefit from CRT.  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  Low cardiac output on RHC this admission.  Noncompliance with medications complicates situation. He is now on milrinone  and has diuresed well, trying to wean off.  Though compliance is a concern, I think that he would be a potential LVAD candidate. If we can wean him off milrinone , will have him followup in LVAD  clinic.  If we cannot wean him off, may directly transfer to [facility] for faster LVAD evaluation. As above, stopping milrinone  today and will follow co-ox (if stays >/= 55%, can stay off it).  2. CAD:  LHC 5/24 at [facility] showed severe multivessel CAD including 80% mid RCA, 100% RPDA, 100% mid LCx, 80% OM1, 80% mid-distal LAD. Medical management recommended by [facility] cardiology 05/24. No chest pain.  Mild elevation in HS-TnI with no trend this admission is likely due to demand ischemia from volume overload. Coronary angiography this admission showed occluded PDA and mid AV LCx, 90% ramus, severe diffuse disease in right PLV, nonobstructive LAD disease.  With markedly low EF and without severe LAD disease, I do not think CABG is a good option.  - No ASA given Eliquis  use and stable CAD.  - Continue Zetia , has not been able to tolerate statin.  Would refer to lipid clinic for Repatha as outpatient. Lp(a) is very elevated.  - Best future option will likely be LVAD.  3. PVCs: Zio 5/25 with 21% burden.  He did not tolerate amiodarone  well and was started on mexiletine. He has not been taking mexiletine recently (out of med x week). Few PVCs on telemetry despite milrinone  use.  - Restarted mexiletine 300 mg bid this admission.   - Will need sleep study as outpatient.  4. Morbid obesity: GLP-1 agonist would be ideal as outpatient.  5. Tobacco use: Smoking about 1/2 ppd.  6. 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 No LV thrombus on cardiac MRI.  -  Continue Eliquis .  7. Gout: Very symptomatic, knees and ankles.  Has been on allopurinol  and colchicine .  Had not been taking torsemide  regularly at home so that he did not have to walk to the bathroom with gout pain. Improved with prednisone  burst.  - Can stop prednisone  today.  - Continue allopurinol .    I am going to arrange his LVAD clinic followup assuming he goes home from the hospital over the  weekend and does not have to be transferred to [facility].    CRITICAL CARE Performed by: Ezra Shuck  Total critical care time: 35 minutes  Critical care time was exclusive of separately billable procedures and treating other patients.  Critical care was necessary to treat or prevent imminent or life-threatening deterioration.  Critical care was time spent  personally by me on the following activities: development of treatment plan with patient and/or surrogate as well as nursing, discussions with consultants, evaluation of patient's response to treatment, examination of patient, obtaining history from patient or surrogate, ordering and performing treatments and interventions, ordering and review of laboratory studies, ordering and review of radiographic studies, pulse oximetry and re-evaluation of patient's condition.   Length of Stay: 7  Ezra Shuck, MD  12/01/2023, 10:22 AM  Advanced Heart Failure Team Pager [phone] (M-F; 7a - 5p)  Please contact [facility] for night-coverage after hours (5p -7a ) and weekends on [email or web address]

## 2023-12-01 NOTE — Progress Notes (Signed)
 PHARMACY CONSULT NOTE - ELECTROLYTES  Pharmacy Consult for Electrolyte Monitoring and Replacement   Recent Labs: Height: 6' 3 (190.5 cm) Weight: (!) 136.5 kg (300 lb 14.9 oz) IBW/kg (Calculated) : 84.5 Estimated Creatinine Clearance: 103 mL/min (A) (by C-G formula based on SCr of 1.32 mg/dL (H)). Potassium (mmol/L)  Date Value  12/01/2023 4.3   Magnesium (mg/dL)  Date Value  90/80/7974 2.1   Calcium (mg/dL)  Date Value  90/80/7974 9.1   Albumin (g/dL)  Date Value  90/83/7974 2.9 (L)   Phosphorus (mg/dL)  Date Value  90/80/7974 3.7   Sodium (mmol/L)  Date Value  12/01/2023 135    Assessment  Philip Monroe is a 48 y.o. male presenting with acute exacerbation of CHF. 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 Pharmacy has been consulted to monitor and replace electrolytes.  Diet: heart MIVF:  Pertinent medications: torsemide   Goal of Therapy:  K >4, Mg > 2 Other electrolytes WNL  9/19: K 4.3, Mg 2.1, Phos 3.7  Plan:  No electrolyte replacements warranted at this time Check BMP, Mg, Phos with AM labs  Thank you for allowing pharmacy to be a part of this patient's care.   Ransom Blanch PGY-1 Pharmacy Resident  [place] [facility] [place]  12/01/2023 1:55 PM

## 2023-12-01 NOTE — Plan of Care (Signed)
  Problem: Education: Goal: Knowledge of General Education information will improve Description: Including pain rating scale, medication(s)/side effects and non-pharmacologic comfort measures Outcome: Progressing   Problem: Clinical Measurements: Goal: Ability to maintain clinical measurements within normal limits will improve Outcome: Progressing Goal: Respiratory complications will improve Outcome: Progressing   Problem: Activity: Goal: Risk for activity intolerance will decrease Outcome: Progressing   Problem: Nutrition: Goal: Adequate nutrition will be maintained Outcome: Progressing   Problem: Coping: Goal: Level of anxiety will decrease Outcome: Progressing   Problem: Elimination: Goal: Will not experience complications related to bowel motility Outcome: Progressing Goal: Will not experience complications related to urinary retention Outcome: Progressing   Problem: Pain Managment: Goal: General experience of comfort will improve and/or be controlled Outcome: Progressing   Problem: Safety: Goal: Ability to remain free from injury will improve Outcome: Progressing   Problem: Clinical Measurements: Goal: Will remain free from infection Outcome: Not Progressing Goal: Diagnostic test results will improve Outcome: Not Progressing Goal: Cardiovascular complication will be avoided Outcome: Not Progressing

## 2023-12-02 DIAGNOSIS — I428 Other cardiomyopathies: Secondary | ICD-10-CM

## 2023-12-02 DIAGNOSIS — I10 Essential (primary) hypertension: Secondary | ICD-10-CM

## 2023-12-02 DIAGNOSIS — F17200 Nicotine dependence, unspecified, uncomplicated: Secondary | ICD-10-CM | POA: Diagnosis not present

## 2023-12-02 DIAGNOSIS — I5043 Acute on chronic combined systolic (congestive) and diastolic (congestive) heart failure: Secondary | ICD-10-CM | POA: Diagnosis not present

## 2023-12-02 DIAGNOSIS — I2510 Atherosclerotic heart disease of native coronary artery without angina pectoris: Secondary | ICD-10-CM | POA: Diagnosis not present

## 2023-12-02 DIAGNOSIS — I5023 Acute on chronic systolic (congestive) heart failure: Secondary | ICD-10-CM | POA: Diagnosis not present

## 2023-12-02 DIAGNOSIS — I493 Ventricular premature depolarization: Secondary | ICD-10-CM | POA: Diagnosis not present

## 2023-12-02 LAB — RENAL FUNCTION PANEL
Albumin: 3.2 g/dL — ABNORMAL LOW (ref 3.5–5.0)
Anion gap: 11 (ref 5–15)
BUN: 27 mg/dL — ABNORMAL HIGH (ref 6–20)
CO2: 23 mmol/L (ref 22–32)
Calcium: 8.7 mg/dL — ABNORMAL LOW (ref 8.9–10.3)
Chloride: 100 mmol/L (ref 98–111)
Creatinine, Ser: 1.04 mg/dL (ref 0.61–1.24)
GFR, Estimated: >60 mL/min (ref >60)
Glucose, Bld: 96 mg/dL (ref 70–99)
Phosphorus: 4.2 mg/dL (ref 2.5–4.6)
Potassium: 4.4 mmol/L (ref 3.5–5.1)
Sodium: 134 mmol/L — ABNORMAL LOW (ref 135–145)

## 2023-12-02 LAB — CBC
HCT: 50 % (ref 39.0–52.0)
Hemoglobin: 15.9 g/dL (ref 13.0–17.0)
MCH: 24.8 pg — ABNORMAL LOW (ref 26.0–34.0)
MCHC: 31.8 g/dL (ref 30.0–36.0)
MCV: 77.9 fL — ABNORMAL LOW (ref 80.0–100.0)
Platelets: 421 K/uL — ABNORMAL HIGH (ref 150–400)
RBC: 6.42 MIL/uL — ABNORMAL HIGH (ref 4.22–5.81)
RDW: 19.1 % — ABNORMAL HIGH (ref 11.5–15.5)
WBC: 14 K/uL — ABNORMAL HIGH (ref 4.0–10.5)
nRBC: 0 % (ref 0.0–0.2)

## 2023-12-02 LAB — GLUCOSE, CAPILLARY
Glucose-Capillary: 97 mg/dL (ref 70–99)
Glucose-Capillary: 134 mg/dL — ABNORMAL HIGH (ref 70–99)
Glucose-Capillary: 154 mg/dL — ABNORMAL HIGH (ref 70–99)
Glucose-Capillary: 116 mg/dL — ABNORMAL HIGH (ref 70–99)
Glucose-Capillary: 93 mg/dL (ref 70–99)

## 2023-12-02 LAB — MAGNESIUM: Magnesium: 2.2 mg/dL (ref 1.7–2.4)

## 2023-12-02 LAB — COOXEMETRY PANEL
Carboxyhemoglobin: 2.2 % — ABNORMAL HIGH (ref 0.5–1.5)
Methemoglobin: 0.8 % (ref 0.0–1.5)
O2 Saturation: 69.4 %
Total hemoglobin: 16.8 g/dL — ABNORMAL HIGH (ref 12.0–16.0)
Total oxygen content: 67.3 %

## 2023-12-02 NOTE — Progress Notes (Signed)
 Rounding Note   Patient Name: Philip Monroe Date of Encounter: 12/02/2023  [facility] Cardiologist: Advanced heart failure at [facility]  Subjective Reports feeling well, sitting up eating breakfast Off milrinone  infusion yesterday, Co. ox 69, creatinine 1.04 down from 1.3 Denies significant shortness of breath, no leg swelling Denies PND orthopnea  Additional past medical history as below RHC 05/24 showed normal LV and RV filling pressures, mild pulmonary hypertension with mild elevation of PVR, low cardiac index 1.8.    Echo 03/25 with EF <20%, LV with GHK, LV severely dilated, RV mod reduced, LA severely dilated, mild-mod MR.    Echo this admission with EF < 20%, severe LV dilation, moderate RV enlargement with low normal systolic function, moderate MR, dilated IVC.    PVCs may also contribute, 20.8% on Zio monitor in 5/25.  Scheduled Meds:  allopurinol   100 mg Oral Daily   apixaban   5 mg Oral BID   Chlorhexidine  Gluconate Cloth  6 each Topical Daily   colchicine   0.6 mg Oral Daily   digoxin   0.125 mg Oral Daily   empagliflozin   10 mg Oral Daily   ezetimibe   10 mg Oral QHS   insulin  aspart  0-5 Units Subcutaneous QHS   insulin  aspart  0-9 Units Subcutaneous TID WC   mexiletine  300 mg Oral BID   multivitamin with minerals  1 tablet Oral Daily   sacubitril -valsartan   1 tablet Oral BID   sodium chloride  flush  3 mL Intravenous Q12H   sodium chloride  flush  3 mL Intravenous Q12H   spironolactone   25 mg Oral Daily   torsemide   40 mg Oral Daily   Continuous Infusions:  PRN Meds: acetaminophen , acetaminophen , alum & mag hydroxide-simeth, HYDROcodone -acetaminophen , ondansetron  (ZOFRAN ) IV, mouth rinse, sodium chloride  flush, sodium chloride  flush, sodium chloride  flush   Vital Signs  Vitals:   12/02/23 0600 12/02/23 0737 12/02/23 0800 12/02/23 0900  BP: 108/70 (!) 124/112 (!) 113/92 98/81  Pulse: 67  69   Resp: (!) 31 13 18  (!) 21  Temp:   98 F (36.7 C)    TempSrc:   Oral   SpO2: (!) 81% 96% 98% 99%  Weight:      Height:        Intake/Output Summary (Last 24 hours) at 12/02/2023 1118 Last data filed at 12/02/2023 0900 Gross per 24 hour  Intake 1223 ml  Output 800 ml  Net 423 ml      12/02/2023    4:43 AM 12/01/2023    4:46 AM 11/30/2023    5:00 AM  Last 3 Weights  Weight (lbs) 316 lb 5.8 oz 300 lb 14.9 oz 308 lb 6.8 oz  Weight (kg) 143.5 kg 136.5 kg 139.9 kg      Telemetry Normal sinus rhythm- Personally Reviewed  ECG   - Personally Reviewed  Physical Exam  GEN: No acute distress.   Neck: No JVD Cardiac: RRR, no murmurs, rubs, or gallops.  Respiratory: Clear to auscultation bilaterally. GI: Soft, nontender, non-distended  MS: No edema; No deformity. Neuro:  Nonfocal  Psych: Normal affect   Labs High Sensitivity Troponin:   Recent Labs  Lab 11/24/23 1618 11/24/23 2257  TROPONINIHS 76* 64*     Chemistry Recent Labs  Lab 11/28/23 0550 11/28/23 0551 11/30/23 0409 12/01/23 0350 12/02/23 0447  NA  --    < > 132* 135 134*  K  --    < > 3.9 4.3 4.4  CL  --    < >  100 99 100  CO2  --    < > 24 23 23   GLUCOSE  --    < > 152* 171* 96  BUN  --    < > 25* 26* 27*  CREATININE  --    < > 1.27* 1.32* 1.04  CALCIUM  --    < > 8.8* 9.1 8.7*  MG  --    < > 2.2 2.1 2.2  PROT 6.4*  --   --   --   --   ALBUMIN 2.9*  --   --   --  3.2*  AST 23  --   --   --   --   ALT 24  --   --   --   --   ALKPHOS 62  --   --   --   --   BILITOT 1.2  --   --   --   --   GFRNONAA  --    < > >60 >60 >60  ANIONGAP  --    < > 8 13 11    < > = values in this interval not displayed.    Lipids No results for input(s): CHOL, TRIG, HDL, LABVLDL, LDLCALC, CHOLHDL in the last 168 hours.  Hematology Recent Labs  Lab 11/30/23 0409 12/01/23 0350 12/02/23 0447  WBC 14.2* 15.0* 14.0*  RBC 5.93* 6.24* 6.42*  HGB 14.5 15.5 15.9  HCT 46.9 49.0 50.0  MCV 79.1* 78.5* 77.9*  MCH 24.5* 24.8* 24.8*  MCHC 30.9 31.6 31.8  RDW 18.6*  18.8* 19.1*  PLT 419* 431* 421*   Thyroid No results for input(s): TSH, FREET4 in the last 168 hours.  BNPNo results for input(s): BNP, PROBNP in the last 168 hours.  DDimer No results for input(s): DDIMER in the last 168 hours.   Radiology  MR CARDIAC VELOCITY FLOW MAP Result Date: 12/01/2023 CLINICAL DATA:  Cardiomyopathy EXAM: CARDIAC MRI TECHNIQUE: The patient was scanned on a 1.5 Tesla GE magnet. A dedicated cardiac coil was used. Functional imaging was done using Fiesta sequences. 2,3, and 4 chamber views were done to assess for RWMA's. Modified Simpson's rule using a short axis stack was used to calculate an ejection fraction on a dedicated work [employer]. The patient received 10 cc of Gadavist . After 10 minutes inversion recovery sequences were used to assess for infiltration and scar tissue. FINDINGS: Limited images of the lung fields showed no gross abnormalities. Technically difficult images due to trouble with breath-holding. Severely dilated left ventricle with normal wall thickness. Diffuse hypokinesis with akinesis of the mid inferior and inferolateral walls, LV EF 14%. Prominent trabeculations towards apex, end diastolic noncompacted/compacted ratio measured 1.8 - 2.2. Normal right ventricular size with RV EF 29%. Severe left atrial enlargement, moderate right atrial enlargement. Trileaflet aortic valve with no significant stenosis or regurgitation. Visually, mitral regurgitation looks moderate. Regurgitant fraction not calculated as aortic flow sequence has significant artifact. On delayed enhancement imaging, there was mid-wall late gadolinium enhancement (LGE) in the basal septum. There was >50% mid-wall LGE in the mid inferior and mid inferolateral walls. MEASUREMENTS: MEASUREMENTS LVEDV 631 mL LVEDVi 232 mL/m2 LVSV 88 mL LVEF 14% RVEDV 231 mL RVEDVi 85 mL/m2 RVSV 68 mL RVEF 29% Due to artifact, I do not think that flow sequences are accurate. Global T1  1012, ECV 34% IMPRESSION: 1. Severely dilated left ventricle with normal wall thickness. Diffuse hypokinesis with akinesis of the mid inferior and inferolateral walls, LV EF 14%.  Prominent trabeculations but not quite reaching the MRI definition of noncompaction (noncompacted/compacted radio 2.3). 2.  Normal RV size with RV EF 29%. 3. On delayed enhancement imaging, there was evidence for prior myocardial infarction in the mid inferior/mid inferolateral walls, suspect this area is nonviable. There is mid-wall LGE in the basal septum, suggesting a possible nonischemic cardiomyopathic process. 4. Extracellular volume percentage 34%, suggesting elevated myocardial fibrotic content. Suspect mixed ischemic/nonischemic cardiomyopathy. Dalton Mclean Electronically Signed   By: Ezra Shuck M.D.   On: 12/01/2023 07:54   MR CARDIAC VELOCITY FLOW MAP Result Date: 12/01/2023 CLINICAL DATA:  Cardiomyopathy EXAM: CARDIAC MRI TECHNIQUE: The patient was scanned on a 1.5 Tesla GE magnet. A dedicated cardiac coil was used. Functional imaging was done using Fiesta sequences. 2,3, and 4 chamber views were done to assess for RWMA's. Modified Simpson's rule using a short axis stack was used to calculate an ejection fraction on a dedicated work [employer] officer, [employer]. The patient received 10 cc of Gadavist . After 10 minutes inversion recovery sequences were used to assess for infiltration and scar tissue. FINDINGS: Limited images of the lung fields showed no gross abnormalities. Technically difficult images due to trouble with breath-holding. Severely dilated left ventricle with normal wall thickness. Diffuse hypokinesis with akinesis of the mid inferior and inferolateral walls, LV EF 14%. Prominent trabeculations towards apex, end diastolic noncompacted/compacted ratio measured 1.8 - 2.2. Normal right ventricular size with RV EF 29%. Severe left atrial enlargement, moderate right atrial enlargement. Trileaflet aortic valve  with no significant stenosis or regurgitation. Visually, mitral regurgitation looks moderate. Regurgitant fraction not calculated as aortic flow sequence has significant artifact. On delayed enhancement imaging, there was mid-wall late gadolinium enhancement (LGE) in the basal septum. There was >50% mid-wall LGE in the mid inferior and mid inferolateral walls. MEASUREMENTS: MEASUREMENTS LVEDV 631 mL LVEDVi 232 mL/m2 LVSV 88 mL LVEF 14% RVEDV 231 mL RVEDVi 85 mL/m2 RVSV 68 mL RVEF 29% Due to artifact, I do not think that flow sequences are accurate. Global T1 1012, ECV 34% IMPRESSION: 1. Severely dilated left ventricle with normal wall thickness. Diffuse hypokinesis with akinesis of the mid inferior and inferolateral walls, LV EF 14%. Prominent trabeculations but not quite reaching the MRI definition of noncompaction (noncompacted/compacted radio 2.3). 2.  Normal RV size with RV EF 29%. 3. On delayed enhancement imaging, there was evidence for prior myocardial infarction in the mid inferior/mid inferolateral walls, suspect this area is nonviable. There is mid-wall LGE in the basal septum, suggesting a possible nonischemic cardiomyopathic process. 4. Extracellular volume percentage 34%, suggesting elevated myocardial fibrotic content. Suspect mixed ischemic/nonischemic cardiomyopathy. Dalton Mclean Electronically Signed   By: Ezra Shuck M.D.   On: 12/01/2023 07:54   MR CARDIAC MORPHOLOGY W WO CONTRAST Result Date: 12/01/2023 CLINICAL DATA:  Cardiomyopathy EXAM: CARDIAC MRI TECHNIQUE: The patient was scanned on a 1.5 Tesla GE magnet. A dedicated cardiac coil was used. Functional imaging was done using Fiesta sequences. 2,3, and 4 chamber views were done to assess for RWMA's. Modified Simpson's rule using a short axis stack was used to calculate an ejection fraction on a dedicated work [employer] officer, [employer]. The patient received 10 cc of Gadavist . After 10 minutes inversion recovery sequences were used to  assess for infiltration and scar tissue. FINDINGS: Limited images of the lung fields showed no gross abnormalities. Technically difficult images due to trouble with breath-holding. Severely dilated left ventricle with normal wall thickness. Diffuse hypokinesis with akinesis of the mid inferior and  inferolateral walls, LV EF 14%. Prominent trabeculations towards apex, end diastolic noncompacted/compacted ratio measured 1.8 - 2.2. Normal right ventricular size with RV EF 29%. Severe left atrial enlargement, moderate right atrial enlargement. Trileaflet aortic valve with no significant stenosis or regurgitation. Visually, mitral regurgitation looks moderate. Regurgitant fraction not calculated as aortic flow sequence has significant artifact. On delayed enhancement imaging, there was mid-wall late gadolinium enhancement (LGE) in the basal septum. There was >50% mid-wall LGE in the mid inferior and mid inferolateral walls. MEASUREMENTS: MEASUREMENTS LVEDV 631 mL LVEDVi 232 mL/m2 LVSV 88 mL LVEF 14% RVEDV 231 mL RVEDVi 85 mL/m2 RVSV 68 mL RVEF 29% Due to artifact, I do not think that flow sequences are accurate. Global T1 1012, ECV 34% IMPRESSION: 1. Severely dilated left ventricle with normal wall thickness. Diffuse hypokinesis with akinesis of the mid inferior and inferolateral walls, LV EF 14%. Prominent trabeculations but not quite reaching the MRI definition of noncompaction (noncompacted/compacted radio 2.3). 2.  Normal RV size with RV EF 29%. 3. On delayed enhancement imaging, there was evidence for prior myocardial infarction in the mid inferior/mid inferolateral walls, suspect this area is nonviable. There is mid-wall LGE in the basal septum, suggesting a possible nonischemic cardiomyopathic process. 4. Extracellular volume percentage 34%, suggesting elevated myocardial fibrotic content. Suspect mixed ischemic/nonischemic cardiomyopathy. Dalton Mclean Electronically Signed   By: Ezra Shuck M.D.   On:  12/01/2023 07:54    Cardiac Studies   Patient Profile   48 year old male with ischemic and non-ischemic cardiomyopathy presenting with acute decompensated heart failure. He underwent RHC showing advanced heart failure with markedly decreased CO/CI requiring ionotropic support   Assessment & Plan  1. 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   RHC showed markedly elevated filling pressures with low CI (1.6 Fick/1.77 thermo).  Cardiac MRI showed LV EF 14%, RV EF 29%, suspect mixed ischemic/nonischemic cardiomyopathy with scarring process due to MI in the mid inferior/inferolateral wall and non-coronary type mid-wall LGE in the basal septum.   -- Treated with milrinone  0.25 and Lasix  gtt 12 mg/hr ,   Lasix  stopped with drop in CVP He has been transition to torsemide  40 daily Off milrinone  infusion  -Creatinine improving, go walk 69 - Continue digoxin .  - Continue Entresto  49/51 bid.   - spironolactone  25 mg daily.  - Continue Jardiance  10 mg daily.  -Metoprolol  succinate held -not a candidate for heart transplant in light of obesity, smoking - With consistently depressed EF, he is an ICD candidate. QRS likely not wide enough to benefit from CRT.  -nearing end-stage cardiomyopathy.   Low cardiac output on RHC this admission.    potential LVAD candidate. Heart failure team to arrange followup in LVAD clinic.   2. CAD:   LHC 5/24 at [facility] showed severe multivessel CAD including 80% mid RCA, 100% RPDA, 100% mid LCx, 80% OM1, 80% mid-distal LAD. Medical management recommended by [facility] cardiology 05/24.  Denies anginal symptoms  Coronary angiography this admission showed occluded PDA and mid AV LCx, 90% ramus, severe diffuse disease in right PLV, nonobstructive LAD disease.   CABG is a good option, no severe LAD disease -On Eliquis  in place of aspirin  - Continue Zetia , has  not been able to tolerate statin.   - Consider Repatha  3. PVCs:  Zio 5/25 with 21% burden.   on amiodarone , started on mexiletine.   not been taking mexiletine  recently (out of med x week). Few PVCs on telemetry despite milrinone  use.  - Restarted mexiletine 300 mg bid this admission.   - Will need sleep study as outpatient.   4. Morbid obesity:  GLP-1 agonist would be ideal as outpatient.   5. Tobacco use:  Half pack per day, cessation recommended  6. Anticoagulation:  Possible LV thrombus by echo in the past (not this admission) and possible LV noncompaction. No LV thrombus on cardiac MRI.  -  Continue Eliquis .   7. Gout:   symptomatic, knees and ankles.   on allopurinol  and colchicine .   Off prednisone  today.  - Continue allopurinol .      Signed, Evalene Lunger, MD  12/02/2023, 11:18 AM

## 2023-12-02 NOTE — Progress Notes (Signed)
 A&O patient transferred to room 227 via bed. Patient belongings transferred with patient. Patient connected to off unit telemetry in room 227. Amy, RN notified that patient is here and connected to telemetry. Call light within reach. Patient instructed to call if assistance is needed.

## 2023-12-02 NOTE — Progress Notes (Signed)
 Progress Note   [name] FMW:[id number] DOB: Oct 02, 1975 DOA: 11/24/2023     8 DOS: the patient was seen and examined on 12/02/2023   Brief hospital course: From PCCM & HPI on admission:  48 year old male with past medical history of heart failure and coronary artery disease as well as CKD who presents to the hospital with increased shortness of breath.     Patient presented on 9/12 complaining of worsening shortness of breath of 2 weeks in duration with associated lower extremity swelling.  He also reports knee and ankle pain. He denies having any chest pain and denies having any infectious symptoms.  On presentation to the ED, blood work was notable for a BNP of 1700 prompting admission to the hospital for IV diuresis.   Patient admitted to the hospitalist service and initiated on IV furosemide  and cardiology was consulted. TTE showed marked drop in EF and LV dilation, worse from prior. He was seen by Dr. Rolan on 9/15 in consultation given his complex history of cardiomyopathy with low cardiac output and cardiac index and multiple hospitalizations for decompensated heart failure.  It appears that the patient is not compliant with all his medications.  Given all this, he underwent right and left heart cath today and is admitted to the ICU for ionotropic support.  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  Pt transferred to ICU for milrinone  and lasix  gtts.  09/17: Pt remains on furosemide  gtt @12  mg/hr and milrinone  gtt @0 .25 mcg/kg/min.  Overnight UOP 2.8L and since admission pt -8.9L.   Per heart failure team recommendations lasix  gtt discontinued 2000 09/18: Heart failure team recommending weaning of milrinone  gtt today.  MR Cardiac Morphology W WO Contrast pending.  UOP overnight 550 ml and since admission -12L.  Total oxygen content 80.9.  If pt remains off milrinone  gtt will transfer to telemetry unit and transfer service to TR[facility]. 09/19: Coox decreased to 59 from 83.5 after Milrinone  stopped, was restarted on Milrinone . Not currently requiring vasopressors.  Per HF team, plan to again hold Milrinone  and repeat Coox, may ultimately require transfer to [facility] for LVAD consideration.  Renal function and urine output stable.    Hospitalist service assume care on 12/02/23.  Advanced Heart Failure team is managing patient. Patient was weaned off milrinone  yesterday, 9/19.    Assessment and Plan:  Acute Decompensated HFrEF (ACC/AHA D/NYHA III) Apical Mural Thrombus PMHx: HTN, CAD Echocardiogram 11/26/23: LVEF <20%, indeterminate diastolic parameters, RV systolic function low normal, RV moderately enlarged, moderate Mitral Regurgitation   -Transfer to Progressive unit -Telemetry monitoring -Maintain MAP >65 -Now off vasopressors, milrinone  -Advanced Heart Failure following, appreciate input -Being considered for LVAD -Diuresis as BP and renal function permits ~ Lasix  gtt discontinued 9/17 ~ given 80 mg IV x1 dose on 9/19 -Continue Entresto , Mexitil , and Aldactone   -Continue Eliquis   -Cardiac MRI performed 9/18   #CKD Stage II #Mild Hyponatremia ~ RESOLVED  -Monitor I&O's / urinary output -Follow BMP -Ensure adequate renal perfusion -Avoid nephrotoxic agents as able -Replace electrolytes as indicated ~ Pharmacy following for assistance with electrolyte replacement   #Hyperglycemia  -Hgb A1c: 6.6 -CBG's q4h; Target range  of 140 to 180 -SSI -Follow ICU Hypo/Hyperglycemia protocol      Subjective: Pt seen in stepdown unit this AM.  Reports he feels well.   His only complaint is the fluid restriction, he asks how long he has to be on fluid restriction.    Physical Exam: Vitals:   12/02/23 0900 12/02/23 1000 12/02/23 1100 12/02/23 1200  BP: 98/81 94/74 (!) 120/105 100/77  Pulse:  (!) 35 69 70  Resp: (!) 21 (!) 22 17 16   Temp:      TempSrc:      SpO2: 99% 95% 94%   Weight:      Height:       General exam: awake, alert, no acute distress, obese HEENT: atraumatic, clear conjunctiva, anicteric sclera, moist mucus membranes, hearing grossly normal  Respiratory system: CTAB diminished throughout, no wheezes or rhonchi, normal respiratory effort. Cardiovascular system: normal S1/S2, RRR, unable to visualize JVD, no edema Gastrointestinal system: soft, NT, ND, no HSM felt, +bowel sounds. Central nervous system: A&O x3. no gross focal neurologic deficits, normal speech Extremities: moves all , no edema, normal tone Skin: dry, intact, normal temperature Psychiatry: normal mood, congruent affect, judgement and insight appear normal   Data Reviewed:  Notable labs -- Na 134, BUN 27, Ca 8.8, albumin 3.2, WBC 14.0, platelets 421  Family Communication: None. Pt updated in detail  Disposition: Status is: Inpatient Remains inpatient appropriate because: pending clearance by cardiology for d/c   Planned Discharge Destination: Home    Time spent: 40 minutes  Author: Burnard DELENA Cunning, DO 12/02/2023 3:07 PM  For on call review [email or web address].

## 2023-12-02 NOTE — Plan of Care (Signed)
  Problem: Education: Goal: Knowledge of General Education information will improve Description: Including pain rating scale, medication(s)/side effects and non-pharmacologic comfort measures Outcome: Progressing   Problem: Clinical Measurements: Goal: Ability to maintain clinical measurements within normal limits will improve Outcome: Progressing Goal: Will remain free from infection Outcome: Progressing Goal: Diagnostic test results will improve Outcome: Progressing Goal: Respiratory complications will improve Outcome: Progressing   Problem: Nutrition: Goal: Adequate nutrition will be maintained Outcome: Progressing   Problem: Coping: Goal: Level of anxiety will decrease Outcome: Progressing   Problem: Elimination: Goal: Will not experience complications related to bowel motility Outcome: Progressing Goal: Will not experience complications related to urinary retention Outcome: Progressing   Problem: Pain Managment: Goal: General experience of comfort will improve and/or be controlled Outcome: Progressing   Problem: Safety: Goal: Ability to remain free from injury will improve Outcome: Progressing   Problem: Clinical Measurements: Goal: Cardiovascular complication will be avoided Outcome: Not Progressing   Problem: Activity: Goal: Risk for activity intolerance will decrease Outcome: Not Progressing

## 2023-12-02 NOTE — Plan of Care (Signed)
  Problem: Education: Goal: Knowledge of General Education information will improve Description: Including pain rating scale, medication(s)/side effects and non-pharmacologic comfort measures Outcome: Progressing   Problem: Health Behavior/Discharge Planning: Goal: Ability to manage health-related needs will improve Outcome: Progressing   Problem: Clinical Measurements: Goal: Ability to maintain clinical measurements within normal limits will improve Outcome: Progressing Goal: Will remain free from infection Outcome: Progressing Goal: Diagnostic test results will improve Outcome: Progressing Goal: Respiratory complications will improve Outcome: Progressing Goal: Cardiovascular complication will be avoided Outcome: Progressing   Problem: Activity: Goal: Risk for activity intolerance will decrease Outcome: Progressing   Problem: Nutrition: Goal: Adequate nutrition will be maintained Outcome: Progressing   Problem: Coping: Goal: Level of anxiety will decrease Outcome: Progressing   Problem: Elimination: Goal: Will not experience complications related to bowel motility Outcome: Progressing Goal: Will not experience complications related to urinary retention Outcome: Progressing   Problem: Pain Managment: Goal: General experience of comfort will improve and/or be controlled Outcome: Progressing   Problem: Safety: Goal: Ability to remain free from injury will improve Outcome: Progressing   Problem: Skin Integrity: Goal: Risk for impaired skin integrity will decrease Outcome: Progressing   Problem: Education: Goal: Understanding of CV disease, CV risk reduction, and recovery process will improve Outcome: Progressing   Problem: Activity: Goal: Ability to return to baseline activity level will improve Outcome: Progressing   Problem: Cardiovascular: Goal: Ability to achieve and maintain adequate cardiovascular perfusion will improve Outcome: Progressing Goal:  Vascular access site(s) Level 0-1 will be maintained Outcome: Progressing   Problem: Education: Goal: Ability to describe self-care measures that may prevent or decrease complications (Diabetes Survival Skills Education) will improve Outcome: Progressing   Problem: Coping: Goal: Ability to adjust to condition or change in health will improve Outcome: Progressing   Problem: Fluid Volume: Goal: Ability to maintain a balanced intake and output will improve Outcome: Progressing   Problem: Metabolic: Goal: Ability to maintain appropriate glucose levels will improve Outcome: Progressing   Problem: Nutritional: Goal: Maintenance of adequate nutrition will improve Outcome: Progressing Goal: Progress toward achieving an optimal weight will improve Outcome: Progressing   Problem: Skin Integrity: Goal: Risk for impaired skin integrity will decrease Outcome: Progressing   Problem: Tissue Perfusion: Goal: Adequacy of tissue perfusion will improve Outcome: Progressing

## 2023-12-03 DIAGNOSIS — F17200 Nicotine dependence, unspecified, uncomplicated: Secondary | ICD-10-CM | POA: Diagnosis not present

## 2023-12-03 DIAGNOSIS — M25561 Pain in right knee: Secondary | ICD-10-CM

## 2023-12-03 DIAGNOSIS — I493 Ventricular premature depolarization: Secondary | ICD-10-CM | POA: Diagnosis not present

## 2023-12-03 DIAGNOSIS — I2510 Atherosclerotic heart disease of native coronary artery without angina pectoris: Secondary | ICD-10-CM | POA: Diagnosis not present

## 2023-12-03 DIAGNOSIS — I5023 Acute on chronic systolic (congestive) heart failure: Secondary | ICD-10-CM | POA: Diagnosis not present

## 2023-12-03 LAB — CBC
HCT: 51 % (ref 39.0–52.0)
Hemoglobin: 16.4 g/dL (ref 13.0–17.0)
MCH: 24.8 pg — ABNORMAL LOW (ref 26.0–34.0)
MCHC: 32.2 g/dL (ref 30.0–36.0)
MCV: 77.3 fL — ABNORMAL LOW (ref 80.0–100.0)
Platelets: 416 K/uL — ABNORMAL HIGH (ref 150–400)
RBC: 6.6 MIL/uL — ABNORMAL HIGH (ref 4.22–5.81)
RDW: 19.1 % — ABNORMAL HIGH (ref 11.5–15.5)
WBC: 14.7 K/uL — ABNORMAL HIGH (ref 4.0–10.5)
nRBC: 0 % (ref 0.0–0.2)

## 2023-12-03 LAB — RENAL FUNCTION PANEL
Albumin: 3.4 g/dL — ABNORMAL LOW (ref 3.5–5.0)
Anion gap: 9 (ref 5–15)
BUN: 34 mg/dL — ABNORMAL HIGH (ref 6–20)
CO2: 25 mmol/L (ref 22–32)
Calcium: 9.1 mg/dL (ref 8.9–10.3)
Chloride: 98 mmol/L (ref 98–111)
Creatinine, Ser: 1.48 mg/dL — ABNORMAL HIGH (ref 0.61–1.24)
GFR, Estimated: 58 mL/min — ABNORMAL LOW (ref >60)
Glucose, Bld: 91 mg/dL (ref 70–99)
Phosphorus: 4.9 mg/dL — ABNORMAL HIGH (ref 2.5–4.6)
Potassium: 4.3 mmol/L (ref 3.5–5.1)
Sodium: 132 mmol/L — ABNORMAL LOW (ref 135–145)

## 2023-12-03 LAB — GLUCOSE, CAPILLARY
Glucose-Capillary: 94 mg/dL (ref 70–99)
Glucose-Capillary: 77 mg/dL (ref 70–99)
Glucose-Capillary: 100 mg/dL — ABNORMAL HIGH (ref 70–99)
Glucose-Capillary: 99 mg/dL (ref 70–99)

## 2023-12-03 LAB — COOXEMETRY PANEL
Carboxyhemoglobin: 2.7 % — ABNORMAL HIGH (ref 0.5–1.5)
Methemoglobin: 0.8 % (ref 0.0–1.5)
O2 Saturation: 59.8 %
Total hemoglobin: 17.1 g/dL — ABNORMAL HIGH (ref 12.0–16.0)
Total oxygen content: 57.7 %

## 2023-12-03 NOTE — Progress Notes (Signed)
 Rounding Note   Patient Name: Philip Monroe Date of Encounter: 12/03/2023  [facility] Cardiologist: Advanced heart failure at [facility]  Subjective Ate breakfast, remains in bed laying supine, comfortable Denies significant shortness of breath, no orthopnea no PND No leg swelling Off milrinone  2 days ago, transferred to the floor last night 9:30 PM Blood pressure low but stable, asymptomatic Co. ox 60 Creatinine 1.48   Additional past medical history as below RHC 05/24 showed normal LV and RV filling pressures, mild pulmonary hypertension with mild elevation of PVR, low cardiac index 1.8.    Echo 03/25 with EF <20%, LV with GHK, LV severely dilated, RV mod reduced, LA severely dilated, mild-mod MR.    Echo this admission with EF < 20%, severe LV dilation, moderate RV enlargement with low normal systolic function, moderate MR, dilated IVC.    PVCs may also contribute, 20.8% on Zio monitor in 5/25.  Scheduled Meds:  allopurinol   100 mg Oral Daily   apixaban   5 mg Oral BID   Chlorhexidine  Gluconate Cloth  6 each Topical Daily   colchicine   0.6 mg Oral Daily   digoxin   0.125 mg Oral Daily   empagliflozin   10 mg Oral Daily   ezetimibe   10 mg Oral QHS   insulin  aspart  0-5 Units Subcutaneous QHS   insulin  aspart  0-9 Units Subcutaneous TID WC   mexiletine  300 mg Oral BID   multivitamin with minerals  1 tablet Oral Daily   sacubitril -valsartan   1 tablet Oral BID   sodium chloride  flush  3 mL Intravenous Q12H   spironolactone   25 mg Oral Daily   torsemide   40 mg Oral Daily   Continuous Infusions:  PRN Meds: acetaminophen , acetaminophen , alum & mag hydroxide-simeth, HYDROcodone -acetaminophen , ondansetron  (ZOFRAN ) IV, mouth rinse, sodium chloride  flush, sodium chloride  flush   Vital Signs  Vitals:   12/02/23 2138 12/02/23 2157 12/03/23 0428 12/03/23 0829  BP:  115/82 (!) 89/66 105/76  Pulse:  77 70 73  Resp: 18 18 16 18   Temp:  97.7 F (36.5 C) 97.7 F (36.5  C) 97.8 F (36.6 C)  TempSrc:      SpO2:  97% 98% 100%  Weight:      Height:        Intake/Output Summary (Last 24 hours) at 12/03/2023 1036 Last data filed at 12/02/2023 2114 Gross per 24 hour  Intake 743 ml  Output 1040 ml  Net -297 ml      12/02/2023    4:43 AM 12/01/2023    4:46 AM 11/30/2023    5:00 AM  Last 3 Weights  Weight (lbs) 316 lb 5.8 oz 300 lb 14.9 oz 308 lb 6.8 oz  Weight (kg) 143.5 kg 136.5 kg 139.9 kg      Telemetry Normal sinus rhythm- Personally Reviewed  ECG   - Personally Reviewed  Physical Exam  On examination : alert oriented, no JVD, lungs clear to auscultation bilaterally, heart sounds regular normal S1-S2 no murmurs appreciated, abdomen soft nontender no significant lower extremity edema.  Musculoskeletal exam with good range of motion, neurologic exam grossly nonfocal   Labs High Sensitivity Troponin:   Recent Labs  Lab 11/24/23 1618 11/24/23 2257  TROPONINIHS 76* 64*     Chemistry Recent Labs  Lab 11/28/23 0550 11/28/23 0551 11/30/23 0409 12/01/23 0350 12/02/23 0447 12/03/23 0351  NA  --    < > 132* 135 134* 132*  K  --    < > 3.9 4.3  4.4 4.3  CL  --    < > 100 99 100 98  CO2  --    < > 24 23 23 25   GLUCOSE  --    < > 152* 171* 96 91  BUN  --    < > 25* 26* 27* 34*  CREATININE  --    < > 1.27* 1.32* 1.04 1.48*  CALCIUM  --    < > 8.8* 9.1 8.7* 9.1  MG  --    < > 2.2 2.1 2.2  --   PROT 6.4*  --   --   --   --   --   ALBUMIN 2.9*  --   --   --  3.2* 3.4*  AST 23  --   --   --   --   --   ALT 24  --   --   --   --   --   ALKPHOS 62  --   --   --   --   --   BILITOT 1.2  --   --   --   --   --   GFRNONAA  --    < > >60 >60 >60 58*  ANIONGAP  --    < > 8 13 11 9    < > = values in this interval not displayed.    Lipids No results for input(s): CHOL, TRIG, HDL, LABVLDL, LDLCALC, CHOLHDL in the last 168 hours.  Hematology Recent Labs  Lab 12/01/23 0350 12/02/23 0447 12/03/23 0351  WBC 15.0* 14.0* 14.7*  RBC  6.24* 6.42* 6.60*  HGB 15.5 15.9 16.4  HCT 49.0 50.0 51.0  MCV 78.5* 77.9* 77.3*  MCH 24.8* 24.8* 24.8*  MCHC 31.6 31.8 32.2  RDW 18.8* 19.1* 19.1*  PLT 431* 421* 416*   Thyroid No results for input(s): TSH, FREET4 in the last 168 hours.  BNPNo results for input(s): BNP, PROBNP in the last 168 hours.  DDimer No results for input(s): DDIMER in the last 168 hours.   Radiology  No results found.   Cardiac Studies   Patient Profile   48 year old male with ischemic and non-ischemic cardiomyopathy presenting with acute decompensated heart failure. He underwent RHC showing advanced heart failure with markedly decreased CO/CI requiring ionotropic support   Assessment & Plan  1. 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   Workup/testing this hospitalization RHC showed markedly elevated filling pressures with low CI (1.6 Fick/1.77 thermo).  Cardiac MRI showed LV EF 14%, RV EF 29%, suspect mixed ischemic/nonischemic cardiomyopathy with scarring process due to MI in the mid inferior/inferolateral wall and non-coronary type mid-wall LGE in the basal septum.  - Treatment this hospitalization -- Treated with milrinone  0.25 and Lasix  gtt 12 mg/hr ,   Lasix  stopped with drop in CVP Transitioned to torsemide  40 daily Off milrinone  infusion 2 days ago  -Creatinine stable, coox 60 - Continue digoxin , Entresto  49/51 bid, spironolactone  25 mg daily, Jardiance  10 mg daily.  -Metoprolol  succinate held -not a candidate for heart transplant in light of obesity, smoking - With consistently depressed EF, he is an ICD candidate. QRS likely not wide enough to benefit from CRT.  -nearing end-stage cardiomyopathy.   Low cardiac output on RHC this admission.    potential LVAD candidate. Heart failure team to arrange followup in LVAD clinic.  -  Recommend ambulation on the unit, consider discharge later today  2. CAD:   LHC 5/24 at [facility] showed severe multivessel CAD including 80% mid RCA, 100% RPDA, 100% mid LCx, 80% OM1, 80% mid-distal LAD. Medical management recommended by [facility] cardiology 05/24.  Denies anginal symptoms  Coronary angiography this admission showed occluded PDA and mid AV LCx, 90% ramus, severe diffuse disease in right PLV, nonobstructive LAD disease.   CABG is a good option, no severe LAD disease -On Eliquis  in place of aspirin  - Continue Zetia , has not been able to tolerate statin.   - Consider Repatha, this can be arranged as outpatient  3. PVCs:  Zio 5/25 with 21% burden.   on amiodarone , started on mexiletine.   not been taking mexiletine recently (out of med x week).  -Rare PVCs noted on telemetry - Continue mexiletine 300 mg bid - Outpatient sleep study  4. Morbid obesity:  GLP-1 agonist would be ideal as outpatient. - As outpatient will need to work with pharmacist to see if he will qualify  5. Tobacco use:  Half pack per day, cessation recommended  6. Anticoagulation:  Possible LV thrombus on prior echo (not this admission) and possible LV noncompaction. No LV thrombus on cardiac MRI.  -  Continue Eliquis .   7. Gout:   symptomatic, knees and ankles.   on allopurinol  and colchicine .   Off prednisone ,  Continue allopurinol .      Signed, Evalene Lunger, MD  12/03/2023, 10:36 AM

## 2023-12-03 NOTE — Progress Notes (Signed)
 MEWS Progress Note  Patient Details Name: Philip Monroe MRN: [id number] DOB: 1975/12/14 Today's Date: 12/03/2023   MEWS Flowsheet Documentation:  Assess: MEWS Score Temp: 97.8 F (36.6 C) BP: (!) 80/59 MAP (mmHg): 68 Pulse Rate: 78 ECG Heart Rate: 79 (Patient connected to off unit telemetry in room 227; Amy, RN notified patient connected to tele box. Call light within reach.) Resp: 18 Level of Consciousness: Alert SpO2: 97 % O2 Device: Room Air Patient Activity (if Appropriate): In bed O2 Flow Rate (L/min): 2 L/min Assess: MEWS Score MEWS Temp: 0 MEWS Systolic: 2 MEWS Pulse: 0 MEWS RR: 0 MEWS LOC: 0 MEWS Score: 2 MEWS Score Color: Yellow Assess: SIRS CRITERIA SIRS Temperature : 0 SIRS Respirations : 0 SIRS Pulse: 0 SIRS WBC: 0 SIRS Score Sum : 0 SIRS Temperature : 0 SIRS Pulse: 0 SIRS Respirations : 0 SIRS WBC: 0 SIRS Score Sum : 0 Assess: if the MEWS score is Yellow or Red Were vital signs accurate and taken at a resting state?: Yes Does the patient meet 2 or more of the SIRS criteria?: No MEWS guidelines implemented : Yes, yellow Treat MEWS Interventions: Considered administering scheduled or prn medications/treatments as ordered Take Vital Signs Increase Vital Sign Frequency : Yellow: Q2hr x1, continue Q4hrs until patient remains green for 12hrs Escalate MEWS: Escalate: Yellow: Discuss with charge nurse and consider notifying provider and/or RRT Notify: Charge Nurse/RN Name of Charge Nurse/RN Notified: Emmie Sou Provider Notification Provider Name/Title: Madison Peaches / MD Date Provider Notified: 12/03/23 Time Provider Notified: 2139 Method of Notification: Page (Secure Chat) Notification Reason: Other (Comment) (BP of 80/59 / yellow MEWS) Provider response: Other (Comment) (Verbal order to hold Entresto ) Date of Provider Response: 12/03/23 Time of Provider Response: 2139  Pt asymptomatic    [name] 12/03/2023, 9:51 PM

## 2023-12-03 NOTE — Progress Notes (Incomplete)
 {  Select_TRH_Note:[id number]}

## 2023-12-04 DIAGNOSIS — I5023 Acute on chronic systolic (congestive) heart failure: Secondary | ICD-10-CM | POA: Diagnosis not present

## 2023-12-04 LAB — CBC
HCT: 52.1 % — ABNORMAL HIGH (ref 39.0–52.0)
Hemoglobin: 16.6 g/dL (ref 13.0–17.0)
MCH: 24.7 pg — ABNORMAL LOW (ref 26.0–34.0)
MCHC: 31.9 g/dL (ref 30.0–36.0)
MCV: 77.6 fL — ABNORMAL LOW (ref 80.0–100.0)
Platelets: 404 K/uL — ABNORMAL HIGH (ref 150–400)
RBC: 6.71 MIL/uL — ABNORMAL HIGH (ref 4.22–5.81)
RDW: 19.4 % — ABNORMAL HIGH (ref 11.5–15.5)
WBC: 9.7 K/uL (ref 4.0–10.5)
nRBC: 0 % (ref 0.0–0.2)

## 2023-12-04 LAB — GLUCOSE, CAPILLARY
Glucose-Capillary: 92 mg/dL (ref 70–99)
Glucose-Capillary: 166 mg/dL — ABNORMAL HIGH (ref 70–99)

## 2023-12-04 LAB — RENAL FUNCTION PANEL
Albumin: 3.5 g/dL (ref 3.5–5.0)
Anion gap: 12 (ref 5–15)
BUN: 37 mg/dL — ABNORMAL HIGH (ref 6–20)
CO2: 25 mmol/L (ref 22–32)
Calcium: 8.9 mg/dL (ref 8.9–10.3)
Chloride: 96 mmol/L — ABNORMAL LOW (ref 98–111)
Creatinine, Ser: 1.41 mg/dL — ABNORMAL HIGH (ref 0.61–1.24)
GFR, Estimated: >60 mL/min (ref >60)
Glucose, Bld: 87 mg/dL (ref 70–99)
Phosphorus: 5.5 mg/dL — ABNORMAL HIGH (ref 2.5–4.6)
Potassium: 4.3 mmol/L (ref 3.5–5.1)
Sodium: 133 mmol/L — ABNORMAL LOW (ref 135–145)

## 2023-12-04 MED ORDER — EZETIMIBE 10 MG PO TABS: 10.0000 mg | ORAL_TABLET | Freq: Every day | ORAL | 0 refills | Status: DC

## 2023-12-04 MED ORDER — SPIRONOLACTONE 25 MG PO TABS: 25.0000 mg | ORAL_TABLET | Freq: Every day | ORAL | 1 refills | Status: DC

## 2023-12-04 MED ORDER — SACUBITRIL-VALSARTAN 24-26 MG PO TABS: 1.0000 | ORAL_TABLET | Freq: Two times a day (BID) | ORAL | 1 refills | Status: DC

## 2023-12-04 MED ORDER — TORSEMIDE 20 MG PO TABS: 40.0000 mg | ORAL_TABLET | Freq: Every day | ORAL | 1 refills | Status: DC

## 2023-12-04 MED ORDER — MEXILETINE HCL 150 MG PO CAPS: 300.0000 mg | ORAL_CAPSULE | Freq: Two times a day (BID) | ORAL | 0 refills | Status: DC

## 2023-12-04 MED ORDER — DIGOXIN 125 MCG PO TABS: 0.1250 mg | ORAL_TABLET | Freq: Every day | ORAL | 1 refills | Status: DC

## 2023-12-04 MED ORDER — ELIQUIS 5 MG PO TABS: 5.0000 mg | ORAL_TABLET | Freq: Two times a day (BID) | ORAL | 1 refills | Status: DC

## 2023-12-04 MED ORDER — SACUBITRIL-VALSARTAN 24-26 MG PO TABS: 1.0000 | ORAL_TABLET | Freq: Two times a day (BID) | ORAL | Status: DC

## 2023-12-04 MED ORDER — EMPAGLIFLOZIN 10 MG PO TABS: 10.0000 mg | ORAL_TABLET | Freq: Every day | ORAL | 1 refills | Status: DC

## 2023-12-04 NOTE — Discharge Summary (Signed)
 Physician Discharge Summary   [name] MRN: [id number] DOB: 1975-10-07  Admit date:     11/24/2023  Discharge date: 12/04/2023  Discharge Physician: Burnard DELENA Cunning   PCP: Pcp, No   Recommendations at discharge:    Follow up in VAD clinic as scheduled Follow up with Cardiology / Heart Failure Follow up with Primary Care Recommend follow up on glycemic control, A1c 6.6% Repeat CBC, CMP, Mg at follow ups  Discharge Diagnoses: Principal Problem:   Acute exacerbation of CHF (congestive heart failure) (HCC) Active Problems:   Acute on chronic combined systolic and diastolic CHF (congestive heart failure) (HCC)   PVC's (premature ventricular contractions)   Hypertension   Hyperlipidemia LDL goal <70   CKD (chronic kidney disease), stage II   Acute on chronic congestive heart failure (HCC)   Coronary artery disease involving native coronary artery of native heart without angina pectoris   Non-ischemic cardiomyopathy (HCC)   Tobacco use disorder   Apical mural thrombus   Arthralgia of right knee  Resolved Problems:   * No resolved hospital problems. [facility] Course:  From PCCM & HPI on admission:  48 year old male with past medical history of heart failure and coronary artery disease as well as CKD who presents to the hospital with increased shortness of breath.     Patient presented on 9/12 complaining of worsening shortness of breath of 2 weeks in duration with associated lower extremity swelling.  He also reports knee and ankle pain. He denies having any chest pain and denies having any infectious symptoms.  On presentation to the ED, blood work was notable for a BNP of 1700 prompting admission to the hospital for IV diuresis.   Patient admitted to the hospitalist service and initiated on IV furosemide  and cardiology was consulted. TTE showed marked drop in EF and LV dilation, worse from prior. He was seen by Dr. Rolan on 9/15 in consultation given his complex  history of cardiomyopathy with low cardiac output and cardiac index and multiple hospitalizations for decompensated heart failure.  It appears that the patient is not compliant with all his medications.  Given all this, he underwent right and left heart cath today and is admitted to the ICU for ionotropic support.   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  Pt transferred to ICU for milrinone  and lasix  gtts.  09/17: Pt remains on furosemide  gtt @12  mg/hr and milrinone  gtt @0 .25 mcg/kg/min.  Overnight UOP 2.8L and since admission pt -8.9L.  Per heart failure team recommendations lasix  gtt discontinued 2000 09/18: Heart failure team recommending weaning of milrinone  gtt today.  MR Cardiac Morphology W WO Contrast pending.  UOP overnight 550 ml and since admission -12L.  Total oxygen content 80.9.  If pt remains off milrinone  gtt will transfer to telemetry unit and transfer service to [facility]. 09/19: Coox decreased to 59 from 83.5 after Milrinone  stopped, was restarted on Milrinone . Not currently requiring vasopressors.  Per HF team, plan to again hold Milrinone  and repeat Coox, may ultimately require transfer to [facility] for LVAD consideration.  Renal function and urine output stable.      Hospitalist service assumed  care on 12/02/23.   Advanced Heart Failure team is managing patient. Patient was weaned off milrinone  9/19. Transferred to Progressive floor.  9/22 -- pt has been cleared by Cardiology / Advanced Heart Failure team and is requesting to go home today.  There are no additional acute medical  concerns, patient is stable for discharge with follow up in VAD clinic arranged for Friday.   Assessment and Plan:  Acute Decompensated HFrEF (ACC/AHA D/NYHA III) Apical Mural Thrombus PMHx: HTN, CAD Echocardiogram 11/26/23: LVEF <20%, indeterminate diastolic parameters, RV systolic function low normal, RV moderately enlarged, moderate Mitral Regurgitation    -Telemetry monitoring -Maintain MAP >65 -Now off vasopressors, milrinone  -Advanced Heart Failure following, appreciate input -Being considered for LVAD -Diuresis as BP and renal function permits ~ Lasix  gtt discontinued 9/17 ~ given 80 mg IV x1 dose on 9/19 -Continue Entresto , Mexitil , and Aldactone   -Continue Eliquis   -Cardiac MRI performed 9/18   #CKD Stage II #Mild Hyponatremia ~ RESOLVED  -Repeat BMP at follow up -Avoid nephrotoxic agents as able    #Hyperglycemia  -Hgb A1c: 6.6 -Treated with sliding scale insulin  -Follow up with PCP for management       Consultants: Cardiology, Advanced Heart Failure  Procedures performed:  R/L heart cath  Disposition: Home  Diet recommendation:  Cardiac diet with fluid restriction  DISCHARGE MEDICATION: Allergies as of 12/04/2023       Reactions   Lactose Intolerance (gi) Diarrhea   Statins    myalgias        Medication List     STOP taking these medications    aspirin  81 MG chewable tablet   B-12 1000 MCG Tabs   HYDROcodone -acetaminophen  5-325 MG tablet Commonly known as: NORCO/VICODIN   losartan  25 MG tablet Commonly known as: COZAAR    metoprolol  succinate 25 MG 24 hr tablet Commonly known as: TOPROL -XL   naproxen  500 MG tablet Commonly known as: Naprosyn        TAKE these medications    allopurinol  100 MG tablet Commonly known as: Zyloprim  Take 1 tablet (100 mg total) by mouth daily.   CertaVite/Antioxidants Tabs Take 1 tablet by mouth daily.   digoxin  0.125 MG tablet Commonly known as: LANOXIN  Take 1 tablet (0.125 mg total) by mouth  daily.   Eliquis  5 MG Tabs tablet Generic drug: apixaban  Take 1 tablet (5 mg total) by mouth 2 (two) times daily.   empagliflozin  10 MG Tabs tablet Commonly known as: JARDIANCE  Take 1 tablet (10 mg total) by mouth daily.   ezetimibe  10 MG tablet Commonly known as: ZETIA  Take 1 tablet (10 mg total) by mouth at bedtime.   mexiletine 150 MG capsule Commonly known as: MEXITIL  Take 2 capsules (300 mg total) by mouth 2 (two) times daily.   spironolactone  25 MG tablet Commonly known as: ALDACTONE  Take 1 tablet (25 mg total) by mouth daily.   torsemide  20 MG tablet Commonly known as: DEMADEX  Take 2 tablets (40 mg total) by mouth daily.        Discharge Exam: Filed Weights   12/01/23 0446 12/02/23 0443 12/04/23 0500  Weight: (!) 136.5 kg (!) 143.5 kg (!) 143.7 kg   General exam: awake, alert, no acute distress, obese Respiratory system: on room air, lungs clear diminished, normal respiratory effort. Cardiovascular system: normal S1/S2,  RRR, no pedal edema.   Gastrointestinal system: soft, NT, ND Central nervous system: A&O x3. no gross focal neurologic deficits, normal speech Psychiatry: normal mood, congruent affect, judgement and insight appear normal  Condition at discharge: stable  The results of significant diagnostics from this hospitalization (including imaging, microbiology, ancillary and laboratory) are listed below for reference.   Imaging Studies: VAS US  LOWER EXTREMITY VENOUS (DVT) Result Date: 12/13/2023  Lower Venous DVT Study Patient Name:  IFEANYICHUKWU WICKHAM  Date of Exam:   12/13/2023 Medical Rec #: [id number]       Accession #:    [id number] Date of Birth: 13-Jan-1976       Patient Gender: M Patient Age:   89 years Exam Location:  [facility] Procedure:      VAS US  LOWER EXTREMITY VENOUS (DVT) Referring Phys: EZRA SHUCK --------------------------------------------------------------------------------  Indications: Pre-op, and acute on chronic combined  systolic and diastolic congestive heart failure. Other Indications: Multivessel CAD. Comparison Study: Previous study of the right lower extremity on 9.13.2025. Performing Technologist: Edilia Elden Appl  Examination Guidelines: A complete evaluation includes B-mode imaging, spectral Doppler, color Doppler, and power Doppler as needed of all accessible portions of each vessel. Bilateral testing is considered an integral part of a complete examination. Limited examinations for reoccurring indications may be performed as noted. The reflux portion of the exam is performed with the patient in reverse Trendelenburg.  +---------+---------------+---------+-----------+----------+--------------+ RIGHT    CompressibilityPhasicitySpontaneityPropertiesThrombus Aging +---------+---------------+---------+-----------+----------+--------------+ CFV      Full           Yes      Yes                                 +---------+---------------+---------+-----------+----------+--------------+ SFJ      Full           Yes      Yes                                 +---------+---------------+---------+-----------+----------+--------------+ FV Prox  Full                                                        +---------+---------------+---------+-----------+----------+--------------+ FV Mid   Full                                                        +---------+---------------+---------+-----------+----------+--------------+ FV DistalFull                                                        +---------+---------------+---------+-----------+----------+--------------+ PFV      Full                                                        +---------+---------------+---------+-----------+----------+--------------+ POP      Full           Yes      Yes                                 +---------+---------------+---------+-----------+----------+--------------+  PTV      Full                                                         +---------+---------------+---------+-----------+----------+--------------+ PERO     Full                                                        +---------+---------------+---------+-----------+----------+--------------+   +---------+---------------+---------+-----------+----------+--------------+ LEFT     CompressibilityPhasicitySpontaneityPropertiesThrombus Aging +---------+---------------+---------+-----------+----------+--------------+ CFV      Full           Yes      Yes                                 +---------+---------------+---------+-----------+----------+--------------+ SFJ      Full           Yes      Yes                                 +---------+---------------+---------+-----------+----------+--------------+ FV Prox  Full                                                        +---------+---------------+---------+-----------+----------+--------------+ FV Mid   Full                                                        +---------+---------------+---------+-----------+----------+--------------+ FV DistalFull                                                        +---------+---------------+---------+-----------+----------+--------------+ PFV      Full                                                        +---------+---------------+---------+-----------+----------+--------------+ POP      Full           Yes      Yes                                 +---------+---------------+---------+-----------+----------+--------------+ PTV      Full                                                        +---------+---------------+---------+-----------+----------+--------------+  PERO     Full                                                        +---------+---------------+---------+-----------+----------+--------------+     Summary: BILATERAL: - No evidence of deep vein thrombosis seen in the lower  extremities, bilaterally. -No evidence of popliteal cyst, bilaterally.   *See table(s) above for measurements and observations. Electronically signed by Lonni Gaskins MD on 12/13/2023 at 6:25:02 PM.    Final    VAS US  DOPPLER PRE VAD Result Date: 12/13/2023 PERIOPERATIVE VASCULAR EVALUATION Patient Name:  ESTABAN MAINVILLE  Date of Exam:   12/13/2023 Medical Rec #: [id number]       Accession #:    [id number] Date of Birth: Jun 06, 1975       Patient Gender: M Patient Age:   48 years Exam Location:  [facility] Procedure:      VAS US  DOPPLER PRE VAD Referring Phys: EZRA SHUCK --------------------------------------------------------------------------------  Indications:      LVAD evaluation, pre-op. Risk Factors:     Current smoker, coronary artery disease. Other Factors:    Multivessel CAD. Limitations:      Acute on chronic combined systolic and diastolic congestive                   heart failure. Comparison Study: No prior exam. Performing Technologist: Edilia Elden Appl  Examination Guidelines: A complete evaluation includes B-mode imaging, spectral Doppler, color Doppler, and power Doppler as needed of all accessible portions of each vessel. Bilateral testing is considered an integral part of a complete examination. Limited examinations for reoccurring indications may be performed as noted.  Right Carotid Findings: +----------+--------+--------+--------+--------+--------+           PSV cm/sEDV cm/sStenosisDescribeComments +----------+--------+--------+--------+--------+--------+ CCA Prox  120     26                               +----------+--------+--------+--------+--------+--------+ CCA Distal89      24                               +----------+--------+--------+--------+--------+--------+ ICA Prox  74      13              smooth           +----------+--------+--------+--------+--------+--------+ ICA Mid   51      23                                +----------+--------+--------+--------+--------+--------+ ICA Distal53      18                               +----------+--------+--------+--------+--------+--------+ ECA       78      36                               +----------+--------+--------+--------+--------+--------+ +----------+--------+-------+----------------+------------+           PSV cm/sEDV cmsDescribe        Arm Pressure +----------+--------+-------+----------------+------------+ [id number]  31     Multiphasic, [id number]          +----------+--------+-------+----------------+------------+ +---------+--------+--+--------+--+---------+ VertebralPSV cm/s47EDV cm/s14Antegrade +---------+--------+--+--------+--+---------+ Left Carotid Findings: +----------+--------+--------+--------+--------+--------+           PSV cm/sEDV cm/sStenosisDescribeComments +----------+--------+--------+--------+--------+--------+ CCA Prox  138     22                               +----------+--------+--------+--------+--------+--------+ CCA Distal69      21                               +----------+--------+--------+--------+--------+--------+ ICA Prox  57      23              smooth           +----------+--------+--------+--------+--------+--------+ ICA Mid   81      27                               +----------+--------+--------+--------+--------+--------+ ICA Distal42      20                               +----------+--------+--------+--------+--------+--------+ ECA       71      37                               +----------+--------+--------+--------+--------+--------+ +----------+--------+--------+----------------+------------+ SubclavianPSV cm/sEDV cm/sDescribe        Arm Pressure +----------+--------+--------+----------------+------------+           96      32      Multiphasic, [id number]          +----------+--------+--------+----------------+------------+  +---------+--------+--+--------+--+---------+ VertebralPSV cm/s20EDV cm/s14Antegrade +---------+--------+--+--------+--+---------+  ABI Findings: +---------+------------------+-----+-----------+--------+ Right    Rt Pressure (mmHg)IndexWaveform   Comment  +---------+------------------+-----+-----------+--------+ Brachial 129                    triphasic           +---------+------------------+-----+-----------+--------+ PTA      101               0.78 multiphasic         +---------+------------------+-----+-----------+--------+ DP       96                0.74 multiphasic         +---------+------------------+-----+-----------+--------+ Burnetta Galley                     Normal              +---------+------------------+-----+-----------+--------+ +---------+------------------+-----+---------+-------+ Left     Lt Pressure (mmHg)IndexWaveform Comment +---------+------------------+-----+---------+-------+ Brachial 123                    triphasic        +---------+------------------+-----+---------+-------+ PTA      110               0.85 triphasic        +---------+------------------+-----+---------+-------+ DP       98                0.76 triphasic        +---------+------------------+-----+---------+-------+ Burnetta Crawford  Normal           +---------+------------------+-----+---------+-------+ +-------+---------------+----------------+ ABI/TBIToday's ABI/TBIPrevious ABI/TBI +-------+---------------+----------------+ Right  0.78/ 0.66.                     +-------+---------------+----------------+ Left   0.85/ 0.55.                     +-------+---------------+----------------+ Distorted waveforms due to patient's heart failure.  Summary: Right Carotid: Velocities in the right ICA are consistent with a 1-39% stenosis. Left Carotid: Velocities in the left ICA are consistent with a 1-39% stenosis. Vertebrals:  Bilateral  vertebral arteries demonstrate antegrade flow. Subclavians: Normal flow hemodynamics were seen in bilateral subclavian              arteries.  *See table(s) above for measurements and observations. Right ABI: Resting right ankle-brachial index indicates moderate right lower extremity arterial disease. The right toe-brachial index is abnormal. Left ABI: Resting left ankle-brachial index indicates mild left lower extremity arterial disease. The left toe-brachial index is abnormal.  Electronically signed by Lonni Gaskins MD on 12/13/2023 at 6:24:45 PM.    Final    MR CARDIAC VELOCITY FLOW MAP Result Date: 12/01/2023 CLINICAL DATA:  Cardiomyopathy EXAM: CARDIAC MRI TECHNIQUE: The patient was scanned on a 1.5 Tesla GE magnet. A dedicated cardiac coil was used. Functional imaging was done using Fiesta sequences. 2,3, and 4 chamber views were done to assess for RWMA's. Modified Simpson's rule using a short axis stack was used to calculate an ejection fraction on a dedicated work [employer], [employer]. The patient received 10 cc of Gadavist . After 10 minutes inversion recovery sequences were used to assess for infiltration and scar tissue. FINDINGS: Limited images of the lung fields showed no gross abnormalities. Technically difficult images due to trouble with breath-holding. Severely dilated left ventricle with normal wall thickness. Diffuse hypokinesis with akinesis of the mid inferior and inferolateral walls, LV EF 14%. Prominent trabeculations towards apex, end diastolic noncompacted/compacted ratio measured 1.8 - 2.2. Normal right ventricular size with RV EF 29%. Severe left atrial enlargement, moderate right atrial enlargement. Trileaflet aortic valve with no significant stenosis or regurgitation. Visually, mitral regurgitation looks moderate. Regurgitant fraction not calculated as aortic flow sequence has significant artifact. On delayed enhancement imaging, there was mid-wall late gadolinium  enhancement (LGE) in the basal septum. There was >50% mid-wall LGE in the mid inferior and mid inferolateral walls. MEASUREMENTS: MEASUREMENTS LVEDV 631 mL LVEDVi 232 mL/m2 LVSV 88 mL LVEF 14% RVEDV 231 mL RVEDVi 85 mL/m2 RVSV 68 mL RVEF 29% Due to artifact, I do not think that flow sequences are accurate. Global T1 1012, ECV 34% IMPRESSION: 1. Severely dilated left ventricle with normal wall thickness. Diffuse hypokinesis with akinesis of the mid inferior and inferolateral walls, LV EF 14%. Prominent trabeculations but not quite reaching the MRI definition of noncompaction (noncompacted/compacted radio 2.3). 2.  Normal RV size with RV EF 29%. 3. On delayed enhancement imaging, there was evidence for prior myocardial infarction in the mid inferior/mid inferolateral walls, suspect this area is nonviable. There is mid-wall LGE in the basal septum, suggesting a possible nonischemic cardiomyopathic process. 4. Extracellular volume percentage 34%, suggesting elevated myocardial fibrotic content. Suspect mixed ischemic/nonischemic cardiomyopathy. Dalton Mclean Electronically Signed   By: Ezra Shuck M.D.   On: 12/01/2023 07:54   MR CARDIAC VELOCITY FLOW MAP Result Date: 12/01/2023 CLINICAL DATA:  Cardiomyopathy EXAM: CARDIAC MRI TECHNIQUE: The patient was scanned on a 1.5 Tesla GE  magnet. A dedicated cardiac coil was used. Functional imaging was done using Fiesta sequences. 2,3, and 4 chamber views were done to assess for RWMA's. Modified Simpson's rule using a short axis stack was used to calculate an ejection fraction on a dedicated work [employer] officer, [employer]. The patient received 10 cc of Gadavist . After 10 minutes inversion recovery sequences were used to assess for infiltration and scar tissue. FINDINGS: Limited images of the lung fields showed no gross abnormalities. Technically difficult images due to trouble with breath-holding. Severely dilated left ventricle with normal wall thickness. Diffuse  hypokinesis with akinesis of the mid inferior and inferolateral walls, LV EF 14%. Prominent trabeculations towards apex, end diastolic noncompacted/compacted ratio measured 1.8 - 2.2. Normal right ventricular size with RV EF 29%. Severe left atrial enlargement, moderate right atrial enlargement. Trileaflet aortic valve with no significant stenosis or regurgitation. Visually, mitral regurgitation looks moderate. Regurgitant fraction not calculated as aortic flow sequence has significant artifact. On delayed enhancement imaging, there was mid-wall late gadolinium enhancement (LGE) in the basal septum. There was >50% mid-wall LGE in the mid inferior and mid inferolateral walls. MEASUREMENTS: MEASUREMENTS LVEDV 631 mL LVEDVi 232 mL/m2 LVSV 88 mL LVEF 14% RVEDV 231 mL RVEDVi 85 mL/m2 RVSV 68 mL RVEF 29% Due to artifact, I do not think that flow sequences are accurate. Global T1 1012, ECV 34% IMPRESSION: 1. Severely dilated left ventricle with normal wall thickness. Diffuse hypokinesis with akinesis of the mid inferior and inferolateral walls, LV EF 14%. Prominent trabeculations but not quite reaching the MRI definition of noncompaction (noncompacted/compacted radio 2.3). 2.  Normal RV size with RV EF 29%. 3. On delayed enhancement imaging, there was evidence for prior myocardial infarction in the mid inferior/mid inferolateral walls, suspect this area is nonviable. There is mid-wall LGE in the basal septum, suggesting a possible nonischemic cardiomyopathic process. 4. Extracellular volume percentage 34%, suggesting elevated myocardial fibrotic content. Suspect mixed ischemic/nonischemic cardiomyopathy. Dalton Mclean Electronically Signed   By: Ezra Shuck M.D.   On: 12/01/2023 07:54   MR CARDIAC MORPHOLOGY W WO CONTRAST Result Date: 12/01/2023 CLINICAL DATA:  Cardiomyopathy EXAM: CARDIAC MRI TECHNIQUE: The patient was scanned on a 1.5 Tesla GE magnet. A dedicated cardiac coil was used. Functional imaging was done  using Fiesta sequences. 2,3, and 4 chamber views were done to assess for RWMA's. Modified Simpson's rule using a short axis stack was used to calculate an ejection fraction on a dedicated work [employer] officer, [employer]. The patient received 10 cc of Gadavist . After 10 minutes inversion recovery sequences were used to assess for infiltration and scar tissue. FINDINGS: Limited images of the lung fields showed no gross abnormalities. Technically difficult images due to trouble with breath-holding. Severely dilated left ventricle with normal wall thickness. Diffuse hypokinesis with akinesis of the mid inferior and inferolateral walls, LV EF 14%. Prominent trabeculations towards apex, end diastolic noncompacted/compacted ratio measured 1.8 - 2.2. Normal right ventricular size with RV EF 29%. Severe left atrial enlargement, moderate right atrial enlargement. Trileaflet aortic valve with no significant stenosis or regurgitation. Visually, mitral regurgitation looks moderate. Regurgitant fraction not calculated as aortic flow sequence has significant artifact. On delayed enhancement imaging, there was mid-wall late gadolinium enhancement (LGE) in the basal septum. There was >50% mid-wall LGE in the mid inferior and mid inferolateral walls. MEASUREMENTS: MEASUREMENTS LVEDV 631 mL LVEDVi 232 mL/m2 LVSV 88 mL LVEF 14% RVEDV 231 mL RVEDVi 85 mL/m2 RVSV 68 mL RVEF 29% Due to artifact, I do not think  that flow sequences are accurate. Global T1 1012, ECV 34% IMPRESSION: 1. Severely dilated left ventricle with normal wall thickness. Diffuse hypokinesis with akinesis of the mid inferior and inferolateral walls, LV EF 14%. Prominent trabeculations but not quite reaching the MRI definition of noncompaction (noncompacted/compacted radio 2.3). 2.  Normal RV size with RV EF 29%. 3. On delayed enhancement imaging, there was evidence for prior myocardial infarction in the mid inferior/mid inferolateral walls, suspect this area is  nonviable. There is mid-wall LGE in the basal septum, suggesting a possible nonischemic cardiomyopathic process. 4. Extracellular volume percentage 34%, suggesting elevated myocardial fibrotic content. Suspect mixed ischemic/nonischemic cardiomyopathy. Dalton Mclean Electronically Signed   By: Ezra Shuck M.D.   On: 12/01/2023 07:54   US  EKG SITE RITE Result Date: 11/28/2023 If Site Rite image not attached, placement could not be confirmed due to current cardiac rhythm.  CARDIAC CATHETERIZATION Result Date: 11/28/2023   RPAV-1 lesion is 80% stenosed.   Mid RCA lesion is 60% stenosed.   RPAV-2 lesion is 90% stenosed.   RPDA lesion is 100% stenosed.   Mid Cx lesion is 100% stenosed.   1st Diag lesion is 70% stenosed.   Dist LAD lesion is 40% stenosed.   Mid LAD lesion is 40% stenosed.   Ramus lesion is 90% stenosed. 1. Severely elevated left- and right-sided filling pressures. 2. 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 3. Low cardiac output by Fick and thermodilution. 4. Severe diffuse CAD.  Occluded proximal PDA and mid AV LCx.  Severe diffuse disease right PLV and 90% proximal ramus.  There is nonobstructive disease in the LAD.  Start milrinone  and diurese with Lasix  gtt.  Place PICC.  Medical management of CAD.  With markedly low EF and without severe LAD disease, I do not think CABG is a good option.   ECHOCARDIOGRAM COMPLETE Result Date: 11/26/2023    ECHOCARDIOGRAM REPORT   Patient Name:   KOLTON KIENLE Date of Exam: 11/26/2023 Medical Rec #:  [id number]      Height:       75.0 in Accession #:    [id number]     Weight:       302.0 lb Date of Birth:  January 29, 1976      BSA:          2.615 m Patient Age:    48 years       BP:           100/59 mmHg Patient Gender: M              HR:           71 bpm. Exam Location:  [facility] Procedure: 2D Echo, Cardiac Doppler, Color Doppler and Intracardiac            Opacification Agent (Both Spectral and Color Flow Doppler were            utilized  during procedure). Indications:     CHF I50.21  History:         Patient has prior history of Echocardiogram examinations, most                  recent 05/20/2023.  Sonographer:     Thedora Louder RDCS, FASE Referring Phys:  [id number] BRIGIDA BUREAU Diagnosing Phys: Shelda Bruckner MD  Sonographer Comments: Technically difficult study due to poor echo windows. Image acquisition challenging due to patient body habitus. IMPRESSIONS  1. Left ventricular ejection fraction, by estimation, is <20%. The left ventricle has severely  decreased function. The left ventricle demonstrates global hypokinesis. The left ventricular internal cavity size was severely dilated. Indeterminate diastolic filling due to E-A fusion.  2. Right ventricular systolic function is low normal. The right ventricular size is moderately enlarged.  3. Left atrial size was severely dilated.  4. Right atrial size was moderately dilated.  5. The mitral valve is normal in structure. Moderate mitral valve regurgitation. No evidence of mitral stenosis.  6. The aortic valve is grossly normal. Aortic valve regurgitation is not visualized. No aortic stenosis is present.  7. The inferior vena cava is dilated in size with <50% respiratory variability, suggesting right atrial pressure of 15 mmHg. Comparison(s): Changes from prior study are noted. EF remains severely reduced, LV has further dilated. Conclusion(s)/Recommendation(s): No left ventricular mural or apical thrombus/thrombi. FINDINGS  Left Ventricle: Left ventricular ejection fraction, by estimation, is <20%. The left ventricle has severely decreased function. The left ventricle demonstrates global hypokinesis. Definity  contrast agent was given IV to delineate the left ventricular endocardial borders. The left ventricular internal cavity size was severely dilated. There is no left ventricular hypertrophy. Indeterminate diastolic filling due to E-A fusion. Right Ventricle: The right ventricular size is  moderately enlarged. Right vetricular wall thickness was not well visualized. Right ventricular systolic function is low normal. Left Atrium: Left atrial size was severely dilated. Right Atrium: Right atrial size was moderately dilated. Pericardium: Trivial pericardial effusion is present. Mitral Valve: The mitral valve is normal in structure. Moderate mitral valve regurgitation. No evidence of mitral valve stenosis. Tricuspid Valve: The tricuspid valve is grossly normal. Tricuspid valve regurgitation is trivial. No evidence of tricuspid stenosis. Aortic Valve: The aortic valve is grossly normal. Aortic valve regurgitation is not visualized. No aortic stenosis is present. Aortic valve peak gradient measures 7.8 mmHg. Pulmonic Valve: The pulmonic valve was grossly normal. Pulmonic valve regurgitation is trivial. No evidence of pulmonic stenosis. Aorta: The aortic root, ascending aorta and aortic arch are all structurally normal, with no evidence of dilitation or obstruction. Venous: The inferior vena cava is dilated in size with less than 50% respiratory variability, suggesting right atrial pressure of 15 mmHg. IAS/Shunts: The atrial septum is grossly normal.  LEFT VENTRICLE PLAX 2D LVIDd:         10.00 cm     Diastology LVIDs:         9.10 cm      LV e' medial:    5.77 cm/s LV PW:         1.10 cm      LV E/e' medial:  23.4 LV IVS:        1.10 cm      LV e' lateral:   5.44 cm/s LVOT diam:     2.20 cm      LV E/e' lateral: 24.8 LV SV:         91 LV SV Index:   35 LVOT Area:     3.80 cm  LV Volumes (MOD) LV vol d, MOD A2C: 465.5 ml LV vol d, MOD A4C: 464.5 ml LV vol s, MOD A2C: 366.0 ml LV vol s, MOD A4C: 340.0 ml LV SV MOD A2C:     99.5 ml LV SV MOD A4C:     464.5 ml LV SV MOD BP:      99.7 ml RIGHT VENTRICLE RV Basal diam:  4.84 cm RV S prime:     10.40 cm/s TAPSE (M-mode): 2.5 cm LEFT ATRIUM  Index        RIGHT ATRIUM           Index LA diam:        6.50 cm  2.49 cm/m   RA Area:     28.80 cm LA Vol  (A2C):   133.0 ml 50.87 ml/m  RA Volume:   95.60 ml  36.56 ml/m LA Vol (A4C):   152.0 ml 58.14 ml/m LA Biplane Vol: 153.0 ml 58.52 ml/m  AORTIC VALVE                 PULMONIC VALVE AV Area (Vmax): 3.31 cm     PV Vmax:        0.89 m/s AV Vmax:        140.00 cm/s  PV Peak grad:   3.2 mmHg AV Peak Grad:   7.8 mmHg     RVOT Peak grad: 5 mmHg LVOT Vmax:      122.00 cm/s LVOT Vmean:     81.500 cm/s LVOT VTI:       0.239 m  AORTA Ao Root diam: 3.80 cm Ao Asc diam:  3.20 cm MITRAL VALVE MV Area (PHT): 4.49 cm       SHUNTS MV Decel Time: 169 msec       Systemic VTI:  0.24 m MR Peak grad:    88.0 mmHg    Systemic Diam: 2.20 cm MR Mean grad:    55.5 mmHg MR Vmax:         469.00 cm/s MR Vmean:        352.0 cm/s MR PISA:         2.26 cm MR PISA Eff ROA: 14 mm MR PISA Radius:  0.60 cm MV E velocity: 135.00 cm/s Shelda Bruckner MD Electronically signed by Shelda Bruckner MD Signature Date/Time: 11/26/2023/2:40:25 PM    Final    US  Venous Img Lower Unilateral Right (DVT) Result Date: 11/25/2023 CLINICAL DATA:  Right lower extremity pain EXAM: RIGHT LOWER EXTREMITY VENOUS DOPPLER ULTRASOUND TECHNIQUE: Gray-scale sonography with compression, as well as color and duplex ultrasound, were performed to evaluate the deep venous system(s) from the level of the common femoral vein through the popliteal and proximal calf veins. COMPARISON:  None Available. FINDINGS: VENOUS Normal compressibility of the common femoral, superficial femoral, and popliteal veins, as well as the visualized calf veins. Visualized portions of profunda femoral vein and great saphenous vein unremarkable. No filling defects to suggest DVT on grayscale or color Doppler imaging. Doppler waveforms show normal direction of venous flow, normal respiratory plasticity and response to augmentation. Limited views of the contralateral common femoral vein are unremarkable. OTHER None. Limitations: none IMPRESSION: Negative. Electronically Signed   By: Wilkie Lent M.D.   On: 11/25/2023 14:57   DG Chest 2 View Result Date: 11/24/2023 EXAM: 2 VIEW(S) XRAY OF THE CHEST 11/24/2023 04:29:00 PM COMPARISON: 09/25/2023 CLINICAL HISTORY: Reports SOB since last Monday. Ankle pain and swelling gradually worsening. Now having pain in bilateral elbows. Non-compliant with meds recently due to not being able to ambulate well from ankle issues. Patient feels like he has fluid gain. History of CHF. FINDINGS: LUNGS AND PLEURA: Mild pulmonary vascular congestion, stable to slightly improved. No focal pulmonary opacity. No pulmonary edema. No pleural effusion. No pneumothorax. HEART AND MEDIASTINUM: Cardiomegaly, unchanged. No acute abnormality of the cardiac and mediastinal silhouettes. BONES AND SOFT TISSUES: No acute osseous abnormality. IMPRESSION: 1. Cardiomegaly, unchanged. 2. Mild pulmonary vascular congestion, stable to slightly improved. Electronically signed  by: Lonni Necessary MD 11/24/2023 04:33 PM EDT RP Workstation: [id number]    Microbiology: Results for orders placed or performed during the hospital encounter of 11/24/23  MRSA Next Gen by PCR, Nasal     Status: None   Collection Time: 11/28/23  3:07 PM   Specimen: Nasal Mucosa; Nasal Swab  Result Value Ref Range Status   MRSA by PCR Next Gen NOT DETECTED NOT DETECTED Final    Comment: (NOTE) The GeneXpert MRSA Assay (FDA approved for NASAL specimens only), is one component of a comprehensive MRSA colonization surveillance program. It is not intended to diagnose MRSA infection nor to guide or monitor treatment for MRSA infections. Test performance is not FDA approved in patients less than 66 years old. Performed at [facility], [address] [place], [place] [address]     Labs: CBC: Recent Labs  Lab 12/08/23 1131  WBC 7.2  HGB 16.3  HCT 51.4  MCV 76.9*  PLT 297   Basic Metabolic Panel: Recent Labs  Lab 12/08/23 1131  NA 133*  K 4.2  CL 100  CO2 24  GLUCOSE  113*  BUN 23*  CREATININE 1.41*  CALCIUM 9.4   Liver Function Tests: Recent Labs  Lab 12/08/23 1131  AST 35  ALT 52*  ALKPHOS 78  BILITOT 1.9*  PROT 7.0  ALBUMIN 3.6   CBG: No results for input(s): GLUCAP in the last 168 hours.   Discharge time spent: less than 30 minutes.  Signed: Burnard DELENA Cunning, DO [facility] 12/14/2023

## 2023-12-04 NOTE — Progress Notes (Signed)
 Patient ID: Philip Monroe, male   DOB: 07-16-1975, 48 y.o.   MRN: [id number]     Advanced Heart Failure Rounding Note  Cardiologist: None  Chief Complaint: CHF Subjective:     RHC last week  RA 17 PA 83/53 (59) PCWP 27 Fick 4.1/1.6 Thermo 4/6/1.8  Cath 3v CAD  CMRI LVEF 14%RVEF 29% No significant LGE  SBP dropped to 80 overnight but now ok 110-120s Co-ox 60% renal function stable  Feels fine. No CP or SOB. Eager to go home    Objective:   Weight Range: (!) 143.7 kg Body mass index is 39.6 kg/m.   Vital Signs:   Temp:  [97.4 F (36.3 C)-98.6 F (37 C)] 97.5 F (36.4 C) (09/22 0834) Pulse Rate:  [65-83] 80 (09/22 0834) Resp:  [18] 18 (09/22 0834) BP: (71-137)/(53-106) 110/83 (09/22 0834) SpO2:  [90 %-100 %] 97 % (09/22 0834) Weight:  [143.7 kg] 143.7 kg (09/22 0500) Last BM Date : 12/02/23  Weight change: Filed Weights   12/01/23 0446 12/02/23 0443 12/04/23 0500  Weight: (!) 136.5 kg (!) 143.5 kg (!) 143.7 kg    Intake/Output:   Intake/Output Summary (Last 24 hours) at 12/04/2023 0854 Last data filed at 12/03/2023 1300 Gross per 24 hour  Intake 960 ml  Output --  Net 960 ml      Physical Exam    General:  Well appearing. No resp difficulty HEENT: normal Neck: supple. no JVD. Carotids 2+ bilat; no bruits. No lymphadenopathy or thryomegaly appreciated. Cor: PMI nondisplaced. Regular rate & rhythm. No rubs, gallops or murmurs. Lungs: clear Abdomen: obese soft, nontender, nondistended. No hepatosplenomegaly. No bruits or masses. Good bowel sounds. Extremities: no cyanosis, clubbing, rash, edema Neuro: alert & orientedx3, cranial nerves grossly intact. moves all 4 extremities w/o difficulty. Affect pleasant   Telemetry   NSR 80s + occ PVCs Personally reviewed   Labs    CBC Recent Labs    12/03/23 0351 12/04/23 0513  WBC 14.7* 9.7  HGB 16.4 16.6  HCT 51.0 52.1*  MCV 77.3* 77.6*  PLT 416* 404*   Basic Metabolic Panel Recent Labs     12/02/23 0447 12/03/23 0351 12/04/23 0513  NA 134* 132* 133*  K 4.4 4.3 4.3  CL 100 98 96*  CO2 23 25 25   GLUCOSE 96 91 87  BUN 27* 34* 37*  CREATININE 1.04 1.48* 1.41*  CALCIUM 8.7* 9.1 8.9  MG 2.2  --   --   PHOS 4.2 4.9* 5.5*   Liver Function Tests Recent Labs    12/03/23 0351 12/04/23 0513  ALBUMIN 3.4* 3.5    No results for input(s): LIPASE, AMYLASE in the last 72 hours. Cardiac Enzymes No results for input(s): CKTOTAL, CKMB, CKMBINDEX, TROPONINI in the last 72 hours.  BNP: BNP (last 3 results) Recent Labs    05/26/23 1048 09/25/23 0958 11/24/23 1618  BNP 204.2* 1,509.6* 1,700.4*    ProBNP (last 3 results) No results for input(s): PROBNP in the last 8760 hours.   D-Dimer No results for input(s): DDIMER in the last 72 hours. Hemoglobin A1C No results for input(s): HGBA1C in the last 72 hours.  Fasting Lipid Panel No results for input(s): CHOL, HDL, LDLCALC, TRIG, CHOLHDL, LDLDIRECT in the last 72 hours. Thyroid Function Tests No results for input(s): TSH, T4TOTAL, T3FREE, THYROIDAB in the last 72 hours.  Invalid input(s): FREET3  Other results:   Imaging    No results found.     Medications:  Scheduled Medications:  allopurinol   100 mg Oral Daily   apixaban   5 mg Oral BID   Chlorhexidine  Gluconate Cloth  6 each Topical Daily   colchicine   0.6 mg Oral Daily   digoxin   0.125 mg Oral Daily   empagliflozin   10 mg Oral Daily   ezetimibe   10 mg Oral QHS   insulin  aspart  0-5 Units Subcutaneous QHS   insulin  aspart  0-9 Units Subcutaneous TID WC   mexiletine  300 mg Oral BID   multivitamin with minerals  1 tablet Oral Daily   sacubitril -valsartan   1 tablet Oral BID   sodium chloride  flush  3 mL Intravenous Q12H   spironolactone   25 mg Oral Daily   torsemide   40 mg Oral Daily    Infusions:    PRN Medications: acetaminophen , acetaminophen , alum & mag hydroxide-simeth,  HYDROcodone -acetaminophen , ondansetron  (ZOFRAN ) IV, mouth rinse, sodium chloride  flush, sodium chloride  flush   Assessment/Plan   1. 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  RHC 05/24 showed normal LV and RV filling pressures, mild pulmonary hypertension with mild elevation of PVR, low cardiac index 1.8.  Echo 03/25 with EF <20%, LV with GHK, LV severely dilated, RV mod reduced, LA severely dilated, mild-mod MR.  Echo this admission with EF < 20%, severe LV dilation, moderate RV enlargement with low normal systolic function, moderate MR, dilated IVC.  PVCs may also contribute, 20.8% on Zio monitor in 5/25.  He had run out of most medications and for the last few weeks was only taking Eliquis , torsemide , and Jardiance .  The week prior to admission, he only took torsemide  twice due to gout pain and difficulty getting to the bathroom.  RHC showed markedly elevated filling pressures with low CI (1.6 Fick/1.77 thermo). Cardiac MRI showed LV EF 14%, RV EF 29%, suspect mixed ischemic/nonischemic cardiomyopathy with scarring process due to MI in the mid inferior/inferolateral wall and non-coronary type mid-wall LGE in the basal septum.  - Now off milrinone . Co-ox 60% - Volume status stable on torsemide  40 daily - Continue digoxin .  - With low BP drop Entresto  49/51 bid -> 24/26 bid - spironolactone  25 mg daily.  - Off Toprol  XL with low output.  - Continue Jardiance  10 mg daily.  - With consistently depressed EF, he is an ICD candidate. QRS likely not wide enough to benefit from CRT.  - He has end-stage cardiomyopathy.With weight and smoking, not a candidate for heart transplant.  Noncompliance with medications complicates situation. Will need to determine   2. CAD:  LHC 5/24 at [facility] showed severe multivessel CAD including 80% mid RCA, 100% RPDA, 100% mid LCx, 80% OM1, 80% mid-distal LAD. Medical management recommended  by [facility] cardiology 05/24. No chest pain.  Mild elevation in HS-TnI with no trend this admission is likely due to demand ischemia from volume overload. Coronary angiography this admission showed occluded PDA and mid AV LCx, 90% ramus, severe diffuse disease in right PLV, nonobstructive LAD disease.  With markedly low EF and without severe LAD disease, CABG not an option - NO s/s angina - No ASA given Eliquis  use and stable CAD.  - Continue Zetia , has not been able to tolerate statin.  Would refer to lipid clinic for Repatha as outpatient. Lp(a) is very elevated.  - Best future option will likely be LVAD.   3. PVCs: Zio 5/25 with 21% burden.  He did not tolerate amiodarone  well and was  started on mexiletine. He has not been taking mexiletine recently (out of med x week). Few PVCs on telemetry despite milrinone  use.  - Restarted mexiletine 300 mg bid this admission.   - Will need sleep study as outpatient.   4. Morbid obesity: GLP-1 agonist would be ideal as outpatient.  - Body mass index is 39.6 kg/m.  5. Tobacco use: Smoking about 1/2 ppd.   6. 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 No LV thrombus on cardiac MRI.  -  Continue Eliquis .   7. Gout:  - Finished course of prednisone  - Continue allopurinol .    OK for d/c today with med changes as above. Has f/u in [facility] on Friday.  D/w TRH    Length of Stay: 10  Toribio Fuel, MD  12/04/2023, 8:54 AM  Advanced Heart Failure Team Pager [phone] (M-F; 7a - 5p)  Please contact [facility] for night-coverage after hours (5p -7a ) and weekends on [email or web address]

## 2023-12-04 NOTE — Progress Notes (Signed)
 Discharge instructions reviewed with the patient. IV and picc line removed. Patient will be pushed out by wheelchair once his ride arrives

## 2023-12-05 ENCOUNTER — Other Ambulatory Visit: Payer: Self-pay

## 2023-12-08 ENCOUNTER — Ambulatory Visit (HOSPITAL_COMMUNITY)
Admit: 2023-12-08 | Discharge: 2023-12-08 | Disposition: A | Discharge status: Home / Self Care | Payer: MEDICAID | Attending: Cardiology | Admitting: Cardiology

## 2023-12-08 ENCOUNTER — Encounter (HOSPITAL_COMMUNITY): Payer: Self-pay | Admitting: Unknown Physician Specialty

## 2023-12-08 VITALS — BP 126/84 | HR 80 | Ht 75.0 in | Wt 321.2 lb

## 2023-12-08 DIAGNOSIS — M109 Gout, unspecified: Secondary | ICD-10-CM | POA: Insufficient documentation

## 2023-12-08 DIAGNOSIS — I493 Ventricular premature depolarization: Secondary | ICD-10-CM | POA: Insufficient documentation

## 2023-12-08 DIAGNOSIS — R531 Weakness: Secondary | ICD-10-CM | POA: Insufficient documentation

## 2023-12-08 DIAGNOSIS — I5043 Acute on chronic combined systolic (congestive) and diastolic (congestive) heart failure: Secondary | ICD-10-CM | POA: Diagnosis not present

## 2023-12-08 DIAGNOSIS — R0602 Shortness of breath: Secondary | ICD-10-CM | POA: Insufficient documentation

## 2023-12-08 DIAGNOSIS — I2510 Atherosclerotic heart disease of native coronary artery without angina pectoris: Secondary | ICD-10-CM | POA: Insufficient documentation

## 2023-12-08 DIAGNOSIS — Z79899 Other long term (current) drug therapy: Secondary | ICD-10-CM | POA: Insufficient documentation

## 2023-12-08 DIAGNOSIS — I5023 Acute on chronic systolic (congestive) heart failure: Secondary | ICD-10-CM

## 2023-12-08 DIAGNOSIS — N182 Chronic kidney disease, stage 2 (mild): Secondary | ICD-10-CM | POA: Insufficient documentation

## 2023-12-08 DIAGNOSIS — I272 Pulmonary hypertension, unspecified: Secondary | ICD-10-CM | POA: Insufficient documentation

## 2023-12-08 DIAGNOSIS — Z7984 Long term (current) use of oral hypoglycemic drugs: Secondary | ICD-10-CM | POA: Insufficient documentation

## 2023-12-08 DIAGNOSIS — I5022 Chronic systolic (congestive) heart failure: Secondary | ICD-10-CM

## 2023-12-08 DIAGNOSIS — Z91148 Patient's other noncompliance with medication regimen for other reason: Secondary | ICD-10-CM | POA: Insufficient documentation

## 2023-12-08 DIAGNOSIS — I340 Nonrheumatic mitral (valve) insufficiency: Secondary | ICD-10-CM | POA: Insufficient documentation

## 2023-12-08 DIAGNOSIS — Z5986 Financial insecurity: Secondary | ICD-10-CM | POA: Insufficient documentation

## 2023-12-08 DIAGNOSIS — I472 Ventricular tachycardia, unspecified: Secondary | ICD-10-CM | POA: Insufficient documentation

## 2023-12-08 DIAGNOSIS — I130 Hypertensive heart and chronic kidney disease with heart failure and stage 1 through stage 4 chronic kidney disease, or unspecified chronic kidney disease: Secondary | ICD-10-CM | POA: Insufficient documentation

## 2023-12-08 DIAGNOSIS — Z7901 Long term (current) use of anticoagulants: Secondary | ICD-10-CM | POA: Insufficient documentation

## 2023-12-08 DIAGNOSIS — I428 Other cardiomyopathies: Secondary | ICD-10-CM | POA: Insufficient documentation

## 2023-12-08 DIAGNOSIS — Z87891 Personal history of nicotine dependence: Secondary | ICD-10-CM | POA: Insufficient documentation

## 2023-12-08 LAB — COMPREHENSIVE METABOLIC PANEL WITH GFR
ALT: 52 U/L — ABNORMAL HIGH (ref 0–44)
AST: 35 U/L (ref 15–41)
Albumin: 3.6 g/dL (ref 3.5–5.0)
Alkaline Phosphatase: 78 U/L (ref 38–126)
Anion gap: 9 (ref 5–15)
BUN: 23 mg/dL — ABNORMAL HIGH (ref 6–20)
CO2: 24 mmol/L (ref 22–32)
Calcium: 9.4 mg/dL (ref 8.9–10.3)
Chloride: 100 mmol/L (ref 98–111)
Creatinine, Ser: 1.41 mg/dL — ABNORMAL HIGH (ref 0.61–1.24)
GFR, Estimated: >60 mL/min (ref >60)
Glucose, Bld: 113 mg/dL — ABNORMAL HIGH (ref 70–99)
Potassium: 4.2 mmol/L (ref 3.5–5.1)
Sodium: 133 mmol/L — ABNORMAL LOW (ref 135–145)
Total Bilirubin: 1.9 mg/dL — ABNORMAL HIGH (ref 0.0–1.2)
Total Protein: 7 g/dL (ref 6.5–8.1)

## 2023-12-08 LAB — DIGOXIN LEVEL: Digoxin Level: <0.6 ng/mL — ABNORMAL LOW (ref 0.8–2.0)

## 2023-12-08 LAB — LACTATE DEHYDROGENASE: LDH: 181 U/L (ref 98–192)

## 2023-12-08 LAB — CBC
HCT: 51.4 % (ref 39.0–52.0)
Hemoglobin: 16.3 g/dL (ref 13.0–17.0)
MCH: 24.4 pg — ABNORMAL LOW (ref 26.0–34.0)
MCHC: 31.7 g/dL (ref 30.0–36.0)
MCV: 76.9 fL — ABNORMAL LOW (ref 80.0–100.0)
Platelets: 297 K/uL (ref 150–400)
RBC: 6.68 MIL/uL — ABNORMAL HIGH (ref 4.22–5.81)
RDW: 19.2 % — ABNORMAL HIGH (ref 11.5–15.5)
WBC: 7.2 K/uL (ref 4.0–10.5)
nRBC: 0 % (ref 0.0–0.2)

## 2023-12-08 LAB — LIPID PANEL
Cholesterol: 190 mg/dL (ref 0–200)
HDL: 39 mg/dL — ABNORMAL LOW (ref >40)
LDL Cholesterol: 130 mg/dL — ABNORMAL HIGH (ref 0–99)
Total CHOL/HDL Ratio: 4.9 ratio
Triglycerides: 107 mg/dL (ref <150)
VLDL: 21 mg/dL (ref 0–40)

## 2023-12-08 LAB — TSH: TSH: 2.816 u[IU]/mL (ref 0.350–4.500)

## 2023-12-08 LAB — APTT: aPTT: 33 s (ref 24–36)

## 2023-12-08 LAB — T4, FREE: Free T4: 1.13 ng/dL — ABNORMAL HIGH (ref 0.61–1.12)

## 2023-12-08 LAB — PREALBUMIN: Prealbumin: 27 mg/dL (ref 18–38)

## 2023-12-08 LAB — HEPATITIS B SURFACE ANTIBODY,QUALITATIVE: Hep B S Ab: NONREACTIVE

## 2023-12-08 LAB — HEPATITIS B SURFACE ANTIGEN: Hepatitis B Surface Ag: NONREACTIVE

## 2023-12-08 LAB — PSA: Prostatic Specific Antigen: 0.26 ng/mL (ref 0.00–4.00)

## 2023-12-08 LAB — HIV ANTIBODY (ROUTINE TESTING W REFLEX): HIV Screen 4th Generation wRfx: NONREACTIVE

## 2023-12-08 LAB — PROTIME-INR
INR: 1.2 (ref 0.8–1.2)
Prothrombin Time: 15.7 s — ABNORMAL HIGH (ref 11.4–15.2)

## 2023-12-08 MED ORDER — COLCHICINE 0.6 MG PO TABS: 0.6000 mg | ORAL_TABLET | Freq: Two times a day (BID) | ORAL | 6 refills | Status: DC

## 2023-12-08 MED ORDER — SACUBITRIL-VALSARTAN 49-51 MG PO TABS: 1.0000 | ORAL_TABLET | Freq: Two times a day (BID) | ORAL | 6 refills | Status: DC

## 2023-12-08 NOTE — Patient Instructions (Signed)
 We will refer you to the lipid clinic Start Colcichine daily Increase Entresto  to 49-51 twice a day We will schedule your LVAD evaluation and call you with a schedule

## 2023-12-08 NOTE — Progress Notes (Signed)
 Patient presents for hosp f/u and VAD consult in VAD Clinic today with  his best friend. Recent hospitalization with initiation at [facility].    Pt was recently hospitalized at [facility] for CHF excerbation. He tells me that since discharge he feels like he is keeping the fluid off but he has weakness in his legs. He is taking allopurinol  but was not given a script for colchicine  at d/c - this was provided today.  Pt states that he is SOB with just walking, and that his has decreased appetite along with PND and palpitations.  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              VAD evaluation consent reviewed and signed by Mr. Vandervelden. Initial VAD teaching completed with pt and caregiver.   VAD educational packet including Understanding Your Options with Advanced Heart Failure, [place] Patient Agreement for VAD Evaluation and Potential Implantation consent, and Abbott Heartmate 3 Left Ventricular Device (LVAD) Patient Guide, Heartmate 3 Left Ventricular Assist System Patient Education Program DVD, [place] HM III Patient Education, [place] Mechanical Circulatory Support Program, and Decision Aids for Left Ventricular Assist Device reviewed in detail and left at bedside for continued reference.   All questions answered regarding VAD implant, hospital stay, and what to expect when discharged home living with a heart pump. Pt identified his daughter as his primary caregiver if this therapy should be deemed appropriate for him.  Explained need for 24/7 care when pt is discharged home due to sternal precautions, adaptation to living on support, emotional support, consistent and meticulous exit site care and management, medication adherence and high volume of follow up visits with the VAD Clinic after discharge; both pt and caregiver verbalized understanding of above.   Explained that LVAD can be implanted for two  indications in the setting of advanced left ventricular heart failure treatment:  Bridge to transplant - used for patients who cannot safely wait for heart transplant without this device.  Or    Destination therapy - used for patients until end of life or recovery of heart function.  Patient and caregiver(s) acknowledge that the indication at this point in time for LVAD therapy would be for DT due to BMI.   Provided brief equipment overview and demonstration with HeartMate III training loop including discussion on the following:   a) mobile power unit b) system controller   c) universal Magazine features editor   d) battery clips   e) Batteries   f)  Perc lock   g) Percutaneous lead   Demonstrated and discussed:  a) changing power source on system controller from tethered (MPU) to untethered (battery) mode   b) changing power source on system controller from untethered (battery) to tethered (MPU) mode   c) how to monitor battery life both on the system controller and on each individual battery   d) changing batteries   Reviewed and supplied a copy of home inspection check list stressing that only three pronged grounded power outlets can be used for VAD equipment. Mr Struckman confirmed home has electrical outlets that will support the equipment along with access to a working telephone.  Identified the following lifestyle modifications while living on MCS:    1. No driving for at least three months and then only if doctor gives permission to do so.   2. No tub baths while pump  implanted, and shower only when doctor gives permission.   3. No swimming or submersion in water  while implanted with pump.   4. No contact sports or engaging in jumping activities.   5. Always have a backup controller, charged spare batteries, and battery clips nearby at all times in case of emergency.   6. Call the doctor or hospital contact person if any change in how the pump sounds, feels, or works.   7. Plan to sleep  only when connected to the power module.   8. Do not sleep on your stomach.   9. Keep a backup system controller, charged batteries, battery clips, and flashlight near you during sleep in case of electrical power outage.  10. Exit site care including dressing changes, monitoring for infection, and importance of keeping percutaneous lead stabilized at all times.    Extended the option to have one of our current patients and caregiver(s) come to talk with them about living on support to assist with decision making. We will arrange this at his follow up visit.  Reviewed pictures of VAD drive line, site care, dressing changes, and drive line stabilization including securement attachment device and abdominal binder. Discussed with pt and family that they will be required to purchase dressing supplies as long as patient has the VAD in place.   He will also need to abide by sternal precautions with no lifting >10lbs, pushing, pulling and will need assistance with adapting to new life style with VAD equipment and care.   Intermacs patient survival statistics through March 2025 reviewed with patient and caregiver as follows:    The patient understands that from this discussion it does not mean that they will receive the device, but that depends on an extensive evaluation process. The patient is aware of the fact that if at anytime they want to stop the evaluation process they can.  All questions have been answered at this time and contact information was provided should they encounter any further questions.  They are both agreeable at this time to the evaluation process and will move forward.    Vital Signs:  HR: 80 BP: 126/84 (93) - pt has not taken his medications yet today SPO2: 96 %   Weight: 321.2 lb  Last weight:  lb Home weights:  lbs   Symptom YES NO DETAILS  Angina x  Activity:  Claudication  x How Far:  Syncope  x When:  Stroke  x   Orthopnea  x How many pillows:  PND x  How often:   CPAP  x How many hours:  Pedal Edema  x   Abdominal Fullness x    Nausea / Vomit x  Decreased appetite  Diaphoresis  x When:  Shortness of Breath x  Activity: walking  Palpitations x  When:  ICD shock N/a    Bleeding S/S     Tea-colored Urine     Hospitalizations     Emergency Room     Other MD     Activity   Fluid   Diet       Device: n/a   Patient Instructions:  We will refer you to the lipid clinic Start Colcichine daily Increase Entresto  to 49-51 twice a day We will schedule your LVAD evaluation and call you with a schedule     Lauraine Ip, RN VAD Coordinator   Office: [phone] 24/7 Emergency VAD Pager: [phone]

## 2023-12-09 LAB — HEPATITIS B CORE ANTIBODY, TOTAL: HEP B CORE AB: NEGATIVE

## 2023-12-09 LAB — T3, FREE: T3, Free: 3.5 pg/mL (ref 2.0–4.4)

## 2023-12-10 ENCOUNTER — Ambulatory Visit (HOSPITAL_COMMUNITY): Payer: Self-pay | Admitting: Cardiology

## 2023-12-10 LAB — LUPUS ANTICOAGULANT PANEL
DRVVT: 49.2 s — ABNORMAL HIGH (ref 0.0–47.0)
PTT Lupus Anticoagulant: 35.7 s (ref 0.0–43.5)

## 2023-12-10 LAB — ANTITHROMBIN PANEL
AT III AG PPP IMM-ACNC: 93 % (ref 72–124)
Antithrombin Activity: 122 % (ref 75–135)

## 2023-12-10 LAB — DRVVT MIX: dRVVT Mix: 40.5 s — ABNORMAL HIGH (ref 0.0–40.4)

## 2023-12-10 LAB — DRVVT CONFIRM: dRVVT Confirm: 0.9 ratio (ref 0.8–1.2)

## 2023-12-10 NOTE — Progress Notes (Signed)
 Advanced Heart Failure Clinic Note    PCP: Pcp, No Cardiologist: None   Chief Complaint: CHF  HPI:  Philip Monroe is a 48 y.o.. AAM with multivessel CAD, chronic HFrEF, iCM, noncompaction CM, HTN, hx ETOH use, noncompliance and morbid obesity.    He was followed in the past by [facility] cardiology for iCM/noncompaction CM. RHC 5/24: Normal LV and RV filling pressures. Mild pulmonary hypertension with mild elevation of PVL. Low cardiac index 1.8. LHC 5/24: Severe multivessel CAD including 80% mid RCA, 100% RPDA, 100% mid LCx, 80% OM1, 80% mid-distal LAD. Medically managed.    Admitted 05/19/23 with progressive HF symptoms after being noncompliant with meds. Echo EF <20%, LV with GHK, LV severely dilated, RV mod reduced, LA severely dilated, mild-mod MR. Diuresed well with IV lasix . GDMT restarted. D/c weight 360 pounds.   Zio monitor (5/25) with 25.3% PVCs and frequent NSVT. Mexiletine started.   He was admitted 7/25 with CHF.   He was admitted in 9/25 with CHF exacerbation. Echo showed EF < 20%, low normal RV function, moderate MR.  LHC/RHC showed severely elevated filling pressures, low CO by Fick and thermodilution, severe diffuse CAD with occluded proximal PDA and mid AV LCx, severe diffuse disease in the PLV and 90% proximal ramus stenosis with nonobstructive LAD disease.  Cardiac MRI showed severely dilated LV with LV EF 14%, RV EF 29%, delayed enhancement showed evidence for prior MI in the mid inferior and mid inferolateral walls, also mid-wall LGE in the basal septum. Patient was started on milrinone  and diuresed, milrinone  was titrated off.    He presents today for evaluation in LVAD clinic.  Since discharge from hospital 5 days ago, he has been doing well.  No lightheadedness.  He is taking all his meds.  He short of breath walking about 100 feet and is sometimes short of breath getting dressed.  No chest pain.  No orthopnea/PND.  He smoked his last cigarette the day he got out of the  hospital and says that he has now quit. Still with some pain in knees and ankles, thinks due to gout.  Overall feels weak.   Labs (9/25): Hgb 16.6, K 4.3, creatinine 1.4  ECG (personally reviewed) NSR with bigeminal PVCs  ROS: All systems negative except what is listed in HPI, PMH and Problem List    Past Medical History:  Diagnosis Date   Aortic root dilatation    a. 07/2022 Echo: Ao root 4.4cm, Asc Ao 4.0cm.   CAD (coronary artery disease)    a. 08/2017 Cath: LM nl, LAD 71m/d, D1 70ost, LCX 119m, OM1 90 inf branch, 60 sup branch, OM2 small, RCA 30 diffuse, RPDA 100 w/ L->R collats; b. 07/2022 Echo: LM nl, LAD 23m, D1 80, LCX 133m, OM1 80, RCA 22m, RPDA 100-->No targets for PCI/CABG-->Med rx.   CHF (congestive heart failure) (HCC)    takes lasix    Chronic HFrEF (heart failure with reduced ejection fraction) (HCC)    a. 03/2015 Echo: EF 35-40%; b. 08/2017 Echo: EF 15%; c. 08/2017 cMRI: EF 35%; d. 06/2018 Echo: EF 10-15%; e. 07/2022 Echo: EF 15-20%, grIII DD, mild MR, sev dil LA, mod-sev PH, Ao root 4.4cm, Asc Ao 4.0cm; f. 07/2022 RHC: RA 4, RV 38/3, PA 43/18 (26), PCWP 11. CO/CI 5.3/1.8.   CKD (chronic kidney disease), stage II    Gout    Hyperlipidemia LDL goal <70    Hypertension    Ischemic cardiomyopathy    a. 03/2015 Echo: EF 35-40%;  b. 08/2017 Echo: EF 15%; c. 08/2017 cMRI: EF 35%; d. 06/2018 Echo: EF 10-15%; e. 07/2022 Echo: EF 15-20%, grIII DD.   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 09/2022 Event monitor: predominantly sinus rhythm, avg HR 81 (46-167). 17 runs NSVT (fastest/longest 13 beats x 167). Rare PACs, 17.5% PVC burden.   PVC's (premature ventricular contractions)    a. 09/2022 Event Monitor: 17.5% PVC burden.   Statin intolerance    Tobacco abuse     Current Outpatient Medications  Medication Sig Dispense Refill   allopurinol  (ZYLOPRIM ) 100 MG tablet  Take 1 tablet (100 mg total) by mouth daily. 30 tablet 2   digoxin  (LANOXIN ) 0.125 MG tablet Take 1 tablet (0.125 mg total) by mouth daily. 30 tablet 1   ELIQUIS  5 MG TABS tablet Take 1 tablet (5 mg total) by mouth 2 (two) times daily. 60 tablet 1   empagliflozin  (JARDIANCE ) 10 MG TABS tablet Take 1 tablet (10 mg total) by mouth daily. 30 tablet 1   ezetimibe  (ZETIA ) 10 MG tablet Take 1 tablet (10 mg total) by mouth at bedtime. 90 tablet 0   mexiletine (MEXITIL ) 150 MG capsule Take 2 capsules (300 mg total) by mouth 2 (two) times daily. 120 capsule 0   sacubitril -valsartan  (ENTRESTO ) 49-51 MG Take 1 tablet by mouth 2 (two) times daily. 60 tablet 6   spironolactone  (ALDACTONE ) 25 MG tablet Take 1 tablet (25 mg total) by mouth daily. 30 tablet 1   torsemide  (DEMADEX ) 20 MG tablet Take 2 tablets (40 mg total) by mouth daily. 30 tablet 1   colchicine  0.6 MG tablet Take 1 tablet (0.6 mg total) by mouth 2 (two) times daily. 60 tablet 6   Multiple Vitamin (MULTIVITAMIN WITH MINERALS) TABS tablet Take 1 tablet by mouth daily. (Patient not taking: Reported on 12/08/2023) 90 tablet 2   No current facility-administered medications for this encounter.    Allergies  Allergen Reactions   Lactose Intolerance (Gi) Diarrhea   Statins     myalgias      Social History   Socioeconomic History   Marital status: Single    Spouse name: Not on file   Number of children: 4   Years of education: Not on file   Highest education level: 10th grade  Occupational History   Not on file  Tobacco Use   Smoking status: Every Day    Current packs/day: 0.00    Types: Cigarettes    Last attempt to quit: 03/20/2015    Years since quitting: 8.7   Smokeless tobacco: Never  Vaping Use   Vaping status: Never Used  Substance and Sexual Activity   Alcohol use: Not Currently    Alcohol/week: 36.0 standard drinks of alcohol    Types: 36 Cans of beer per week   Drug use: No   Sexual activity: Yes    Birth  control/protection: Coitus interruptus, Condom  Other Topics Concern   Not on file  Social History Narrative   Not on file   Social Drivers of Health   Financial Resource Strain: Medium Risk (05/22/2023)   Overall Financial Resource Strain (CARDIA)    Difficulty of Paying Living Expenses: Somewhat hard  Food Insecurity: No Food Insecurity (11/25/2023)   Hunger Vital Sign    Worried About Running Out of Food in the Last Year: Never true    Ran Out of  Food in the Last Year: Never true  Transportation Needs: Unmet Transportation Needs (11/25/2023)   PRAPARE - Administrator, Civil Service (Medical): Yes    Lack of Transportation (Non-Medical): Yes  Physical Activity: Not on file  Stress: Not on file  Social Connections: Not on file  Intimate Partner Violence: Not At Risk (11/25/2023)   Humiliation, Afraid, Rape, and Kick questionnaire    Fear of Current or Ex-Partner: No    Emotionally Abused: No    Physically Abused: No    Sexually Abused: No     No family history on file.  Vitals:   12/08/23 1122  BP: 126/84  Pulse: 80  SpO2: 96%  Weight: (!) 145.7 kg (321 lb 3.2 oz)  Height: 6' 3 (1.905 m)    Wt Readings from Last 3 Encounters:  12/08/23 (!) 145.7 kg (321 lb 3.2 oz)  12/04/23 (!) 143.7 kg (316 lb 12.8 oz)  09/26/23 (!) 155.9 kg (343 lb 11.2 oz)   Lab Results  Component Value Date   CREATININE 1.41 (H) 12/08/2023   CREATININE 1.41 (H) 12/04/2023   CREATININE 1.48 (H) 12/03/2023    PHYSICAL EXAM: General: NAD Neck: No JVD, no thyromegaly or thyroid nodule.  Lungs: Clear to auscultation bilaterally with normal respiratory effort. CV: Nondisplaced PMI.  Heart regular S1/S2, no S3/S4, no murmur.  No peripheral edema.  No carotid bruit.  Normal pedal pulses.  Abdomen: Soft, nontender, no hepatosplenomegaly, no distention.  Skin: Intact without lesions or rashes.  Neurologic: Alert and oriented x 3.  Psych: Normal affect. Extremities: No clubbing or  cyanosis.  HEENT: Normal.   ASSESSMENT & PLAN:  1. 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  RHC 05/24 showed normal LV and RV filling pressures, mild pulmonary hypertension with mild elevation of PVR, low cardiac index 1.8.  Echo 3/25 with EF <20%, LV with GHK, LV severely dilated, RV mod reduced, LA severely dilated, mild-mod MR.  Echo 9/25 with EF < 20%, severe LV dilation, moderate RV enlargement with low normal systolic function, moderate MR, dilated IVC.  PVCs may also contribute, 25.3% PVCs with NSVT on Zio monitor in 5/25.  Re-admitted in 9/25.  Had been out of most medications.  The week prior to admission, he only took torsemide  twice due to gout pain and difficulty getting to the bathroom.  RHC showed markedly elevated filling pressures with low CI (1.6 Fick/1.77 thermo). Cardiac MRI showed LV EF 14%, RV EF 29%, suspect mixed ischemic/nonischemic cardiomyopathy with scarring process due to MI in the mid inferior/inferolateral wall and non-coronary type mid-wall LGE in the basal septum.  He was started on milrinone  and diuresed, then milrinone  weaned off.  Since discharge, he says that he has been taking all his meds.  NYHA class III symptoms, not volume overloaded on exam today.  - Continue torsemide  40 mg daily.  - Increase Entresto  to 49/51 bid.  BMET/BNP today, BMET in 10 days.    - Continue digoxin , check level today.  - Continue Entresto  49/51 bid.   - spironolactone  25 mg daily.  - Off Toprol  XL with low output.  - Continue Jardiance  10 mg daily.  - With weight and smoking (quit <5 days ago), not a candidate for heart transplant.  - With consistently depressed EF, he is an ICD candidate. QRS likely not wide enough to benefit from CRT.  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  Low cardiac output on RHC  9/25.  Prior noncompliance with medications complicates situation.  Though compliance is a concern, I think  that he would be a potential LVAD candidate. LVAD nurse discussed LVAD with him today and showed him equipment.  He is interested in pursuing LVAD.  Will make surgical referral.  I would like to see that he can stay on meds and make it to followup visits in LVAD clinic.   2. CAD:  LHC 5/24 at [facility] showed severe multivessel CAD including 80% mid RCA, 100% RPDA, 100% mid LCx, 80% OM1, 80% mid-distal LAD. Medical management recommended by [facility] cardiology 05/24.  Repeat coronary angiography in 9/25 showed occluded PDA and mid AV LCx, 90% ramus, severe diffuse disease in right PLV, nonobstructive LAD disease.  With markedly low EF and without severe LAD disease, I do not think CABG is a good option.  - No ASA given Eliquis  use and stable CAD.  - Continue Zetia , has not been able to tolerate statin.  Refer to lipid clinic for Repatha. Lp(a) is very elevated.  - Best future option will likely be LVAD.  3. PVCs: Zio 5/25 with 25.3%, NSVT noted burden.  He did not tolerate amiodarone  well and was started on mexiletine. ECG today with ventricular bigeminy.  - He had been off mexiletine (ran out and did not refill) but restarted 9/25 admission.   - Will need sleep study.  4. Morbid obesity: GLP-1 agonist would be ideal eventually.  5. Tobacco use: Has quit as of 5 days ago. Congratulated.  6. Anticoagulation: Possible LV thrombus by echo in the past. No LV thrombus on cardiac MRI 9/25.  -  Continue Eliquis .  7. Gout: Very symptomatic, knees and ankles.  Has been on allopurinol  and colchicine .  Had not been taking torsemide  regularly at home prior to 9/25 admission so that he did not have to walk to the bathroom with gout pain. Improved with prednisone  burst in hospital in 9/25.  Pain is returning.  He was not discharged home on colchicine  and wants to restart.  - Restart colchicine .   - Continue allopurinol .    I spent a total of 45 minutes today: 1) reviewing the patient's medical records including previous  charts, labs and recent notes from other providers; 2) examining the patient and counseling them on their medical issues/explaining the plan of care; 3) adjusting meds as needed and 4) ordering lab work or other needed tests.   Ezra Shuck, MD 12/10/23

## 2023-12-13 ENCOUNTER — Ambulatory Visit (HOSPITAL_COMMUNITY)
Admission: RE | Admit: 2023-12-13 | Discharge: 2023-12-13 | Disposition: A | Source: Ambulatory Visit | Attending: Cardiology

## 2023-12-13 ENCOUNTER — Other Ambulatory Visit (HOSPITAL_COMMUNITY): Payer: Self-pay | Admitting: Unknown Physician Specialty

## 2023-12-13 ENCOUNTER — Ambulatory Visit (HOSPITAL_COMMUNITY)
Admission: RE | Admit: 2023-12-13 | Discharge: 2023-12-13 | Disposition: A | Discharge status: Home / Self Care | Source: Ambulatory Visit | Attending: Cardiology | Admitting: Cardiology

## 2023-12-13 ENCOUNTER — Ambulatory Visit (HOSPITAL_BASED_OUTPATIENT_CLINIC_OR_DEPARTMENT_OTHER)
Admission: RE | Admit: 2023-12-13 | Discharge: 2023-12-13 | Disposition: A | Discharge status: Home / Self Care | Source: Ambulatory Visit | Attending: Cardiology | Admitting: Cardiology

## 2023-12-13 DIAGNOSIS — I5043 Acute on chronic combined systolic (congestive) and diastolic (congestive) heart failure: Secondary | ICD-10-CM

## 2023-12-13 LAB — PULMONARY FUNCTION TEST
DL/VA % pred: 63 %
DL/VA: 2.78 ml/min/mmHg/L
DLCO cor % pred: 45 %
DLCO cor: 15.7 ml/min/mmHg
DLCO unc % pred: 47 %
DLCO unc: 16.4 ml/min/mmHg
FEF 25-75 Post: 5.11 L/s
FEF 25-75 Pre: 4.39 L/s
FEF2575-%Change-Post: 16 %
FEF2575-%Pred-Post: 124 %
FEF2575-%Pred-Pre: 106 %
FEV1-%Change-Post: 3 %
FEV1-%Pred-Post: 77 %
FEV1-%Pred-Pre: 75 %
FEV1-Post: 3.64 L
FEV1-Pre: 3.53 L
FEV1FVC-%Change-Post: 4 %
FEV1FVC-%Pred-Pre: 108 %
FEV6-%Change-Post: -1 %
FEV6-%Pred-Post: 70 %
FEV6-%Pred-Pre: 71 %
FEV6-Post: 4.09 L
FEV6-Pre: 4.16 L
FEV6FVC-%Pred-Post: 103 %
FEV6FVC-%Pred-Pre: 103 %
FVC-%Change-Post: -1 %
FVC-%Pred-Post: 68 %
FVC-%Pred-Pre: 69 %
FVC-Post: 4.09 L
FVC-Pre: 4.16 L
Post FEV1/FVC ratio: 89 %
Post FEV6/FVC ratio: 100 %
Pre FEV1/FVC ratio: 85 %
Pre FEV6/FVC Ratio: 100 %
RV % pred: 92 %
RV: 2.07 L
TLC % pred: 79 %
TLC: 6.35 L

## 2023-12-13 LAB — FACTOR 5 LEIDEN

## 2023-12-13 LAB — VAS US DOPPLER PRE VAD

## 2023-12-13 MED ORDER — ALBUTEROL SULFATE (2.5 MG/3ML) 0.083% IN NEBU
2.5000 mg | INHALATION_SOLUTION | Freq: Once | RESPIRATORY_TRACT | Status: AC
  Administered 2023-12-13: 2.5 mg via RESPIRATORY_TRACT

## 2023-12-13 NOTE — Progress Notes (Signed)
 LVAD Initial Psychosocial Screening  Date/Time Initiated:  12:00pm Referral Source:  LVAD Coordinators Referral Reason:  LVAD Initial Psychosocial assessment Source of Information:  Patient  Demographics Name:  Philip Monroe Address:  [address] [place], [place] [address] Home phone:  [phone] (home) /cell Marital Status: Single  Faith:  [employer] Primary Language:  English Last 4 # SS: [id number]  DOB: 03-20-1975   Psychological Health Appearance:  Unremarkable Mental Status:  Alert, oriented Eye Contact:  Good Thought Content:  Coherent Speech:  Unremarkable Mood:  Appropriate and Pleasant  Affect:  Appropriate to circumstance Insight:  Good Judgement: Unimpaired Interaction Style:  Engaged/making jokes   Family/Social Information Who lives in your home? Name: [name]  Relationship: Daughter (22yo) [name]     Mother [name]     Granddaughter (2yo)   Other family members/support persons in your life? Name: Markhi Kleckner  Relationship: Daughter Madeleine      Friend 2 sons    Community Are you active with community agencies/resources/homecare? No  Are you active in a church, synagogue, mosque or other faith based community? No  Are you active in any clubs or social organizations? no What do you do for fun?  Hobbies?  Interests? Enjoys fishing, watching movies, playing poker   Home Environment/Personal Care Do you have reliable phone service? Yes  If so, what is the number?  [phone] Do you own or rent your home? Rents apartment Current mortgage/rent: $550/month Number of steps into the home? 0 How many levels in the home? 2 Electrical needs for LVAD (3 prong outlets)? yes Second hand smoke exposure in the home? no Travel distance from [facility]? 30 minutes   Financial Information What is your source of income? 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  Mom and adult daugther who live with him have no source of  income. Do you have difficulty meeting your monthly expenses? Yes If yes, which ones? Rent/utilities some months How do you cope with this? Borrow money when needed- states he can always figure out a way to get by. Can you budget for the monthly cost for dressing supplies post procedure? Yes   Primary Health insurance:  [employer] Medicaid Have you ever had to refuse medication due to cost?  No Do you use mail order for your prescriptions?  No Have you applied for Social Security Disability/(SSI) applied through [facility] in July of this year   Education/Work Information What is the last grade of school you completed? 10th Do you have any problems with reading or writing?  No Preferred method of learning?  Hands on Are you currently employed?  No  Name of employer? 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 6 years Did you serve in the [employer]? No     Advance Directives: Do you have a Living Will or Medical POA? No  Would you like to complete a Living Will and Medical POA prior to surgery?  Yes Have you had a consult with the Palliative Care Team at [facility]? No   Legal Do you currently have any legal issues/problems?  no Have you had any legal issues/problems in the past?  [place] he had some legal issues but they were a long time ago- 12 years  ago at this point. Do you have a Durable POA?  no  Medical Information Do you have any family history of heart problems? Kurt- states his family is not well connecting but thinks his dad had heart issues Do you have a PCP or other medical provider? No- provided list of options Are you able to complete your ADL's?  yes Do you have any assistive devices in the home? Has crutches that he uses when gout flares up How are you  currently managing your medications at home? Taking them out of the bottle How many hours do you sleep at night? Does not sleep consistently- often up for hours in the middle of the night How is your appetite? Was really bad prior to hospital stay but getting back to normal now. Do you smoke now or past usage? past usage    Quit date: 2 weeks ago Do you drink alcohol now or past usage? past usage    Quit date:  6 months ago- at some point reports using alchol heavily when he was younger drinking 12pack a night but never needed treatment and does not drink at all now. Are you currently using illegal drugs or misuse of medication or past usage? past usage - has been years since use Have you ever been treated for substance abuse? No      If yes, where and when did you receive treatment?   Mental Health History How have you been feeling in the past year? Admits to feelings of depression on and off over the past year related to his health and financial situation.  PHQ2 Depression Scale: 2  Have you ever had any problems with depression, anxiety or other mental health concerns? No history of concerns Have you or are you taking medications for anxiety/depression or any mental health concerns?  No  Have you had any past or current thoughts of suicide? Has had some general thoughts in the past of not wanting to be here anymore but never had a specific plan or took any action towards harming himself. Are there any other current stressors in your life?  Financial stressors Do you see a counselor, psychiatrist or therapist?  no If you are currently experiencing problems are you interested in talking with a professional? Yes What are your coping strategies under stressful situations? Used to turn to smoking when stressed.  Does not feel as if his coping has been tested since he stopped quitting 2 weeks ago- is not sure how he would handle stress now.  Would you be interested in attending the LVAD support  group? yes   Medical & Follow-up Do you take your medications as prescribed by the doctors? yes Do you feel as if you have a good understanding of your medications and what they are for? yes No Show Rate Percentage: 4%   Caregiving Needs Who is the primary caregiver? Anaiya Health status:  good- no concerns Do you drive?  No- doesn't have a license/car Do you work?  no Physical Limitations:  none Do you have other care giving responsibilities?  2yo daughter Contact number: [phone]  Who is the secondary caregiver? [name] Health status:  pt reports she is somewhat mentally unstable- can be really positive and good some days then can have paranoid delusions other days- he is trying to get her to go to the doctor Do you drive?  no Do you work?  no  Physical Limitations:  none Do you have other care giving responsibilities?  no  Reports he has other friends/family members who can help with things like transportation when needed.  Plan for VAD Implementation Do you know and understand what happens during the VAD surgery? Patient Verbalizes Understanding  of surgery and able to describe details What do you know about the risks and side effect associated with VAD surgery? Patient Verbalizes Understanding  of risks (infection, stroke and death) Explain what will happen right after surgery: Patient Verbalizes Understanding  of OR to ICU and will be intubated  What is your plan for transportation for the first 8 weeks post-surgery? (Patients are not recommended to drive post-surgery for 8 weeks)  Driver: Various friends/family members would assist with driving Do you have airbags in your vehicle?  There is a risk of discharging the device if the airbag were to deploy. yes What do you know about your diet post-surgery? Patient Verbalizes Understanding  of Heart healthy How do you plan to complete ADL's post-surgery?  With assistance from family members as needed. Will it be  difficult to ask for help from your caregivers?  no  Please explain what you hope will be improved about your life as a result of receiving the LVAD? Everything- just wants to be able to walk and do activities without getting tired. Please tell me your biggest concern or fear about living with the LVAD?  Concerned about the life changes somewhat- loves swimming so this will be a loss for him.  Please explain your understanding of how your body will change?  Understands he will have driveline and have to carry around batteries. Are you worried about these changes? Is reasonably worried about these changes- looking forward to speaking with LVAD patient to learn more about how these changes feel. Do you see any barriers to your surgery or follow-up? no  Understanding of LVAD Patient states understanding of the following: Surgical procedures and risks, Electrical need for LVAD (3 prong outlets), Safety precautions with LVAD (water , etc.), LVAD daily self-care (dressing changes, computer check, extra supplies), Outpatient follow up (LVAD clinic appts, monitoring blood thinners), and Need for Emergency Planning  Discussed and Reviewed with Patient  Clinical Interventions Needed:     CSW will monitor signs and symptoms of depression and assist with adjustment to life with an LVAD.  CSW will refer patient for Advanced Directives, if not completed prior to surgery if still wishing to complete.  CSW encouraged attendance with the LVAD Support Group to assist further with adjustment and post implant peer support.   Clinical Impressions/Recommendations:     CSW met with pt and his friend Madeleine at clinic to complete psychosocial assessment.  Throughout interview the patient was alert and oriented.  He answered questions openly and seemed to provide honest answers even when the information was unfavorable for him.  Currently Mr. Bertino lives in an apartment with his Mom, adult daughter, and granddaughter.   He is the sole source of income for his household working odd jobs in [employer] and admits he experiences financial difficulty at home- states rent is due today and he doesn't have the money to pay.  When this happens he usually borrows money from others.  CSW spoke regarding his inability to work especially in a physical job for some weeks following surgery and inquired how he would manage this- states he is confident he would be able to pay utilizing his support system and would not worry about becoming homeless.   He reports strong support system by his 4 children and his  mom but there are some concerns with their ability to assist.  His daughter, Anaiya, would be the primary caregiver.  She lives with the patient and he states she is comfortable with medical care but she does have a 33 year old daughter who she is the primary caregiver for.  She is also limited by her inability to drive.  Mr. Everett mom is also part of the caregiving plan but he reports she struggles with some mental health concerns and is not always stable- experiences bouts of paranoia that sound debilitating.  Mr. Scogin does admit to struggling some with depression given his condition.  He is open about these concerns and though he has not sought help in the past he is open to seeking help now.  He is very motivated to move forward with LVAD if that is what is necessary to improve how he has been feeling and extend his life.  His goals are to return to work and be able to return to physical activity.  He is concerned about how the LVAD will change his life but understands these changes are necessary to extend his life at this time.  CSW will have to meet with caregiver at a later date to complete evaluation- he will plan to have his daughter join him for a future appt.  CSW will follow up with patient regarding obtaining a PCP and obtaining an appt with a mental health counselor.  Andriette HILARIO Leech, LCSW Clinical Social  Worker [facility] Desk#: [phone] Cell#: [phone]

## 2023-12-14 ENCOUNTER — Other Ambulatory Visit: Payer: Self-pay

## 2023-12-14 ENCOUNTER — Telehealth (HOSPITAL_COMMUNITY): Payer: Self-pay

## 2023-12-14 ENCOUNTER — Ambulatory Visit

## 2023-12-18 ENCOUNTER — Ambulatory Visit
Admission: RE | Admit: 2023-12-18 | Discharge: 2023-12-18 | Disposition: A | Discharge status: Home / Self Care | Source: Ambulatory Visit | Attending: Cardiology | Admitting: Cardiology

## 2023-12-18 DIAGNOSIS — I5043 Acute on chronic combined systolic (congestive) and diastolic (congestive) heart failure: Secondary | ICD-10-CM | POA: Diagnosis present

## 2023-12-21 ENCOUNTER — Ambulatory Visit

## 2023-12-21 VITALS — BP 92/68 | HR 74 | Resp 20 | Ht 75.0 in | Wt 320.5 lb

## 2023-12-21 DIAGNOSIS — I255 Ischemic cardiomyopathy: Secondary | ICD-10-CM | POA: Diagnosis present

## 2023-12-21 MED ORDER — ALLOPURINOL 100 MG PO TABS: 100.0000 mg | ORAL_TABLET | Freq: Every day | ORAL | 2 refills | Status: DC

## 2023-12-21 NOTE — Progress Notes (Unsigned)
 [address] [place] [address]             [phone]    Philip Monroe [facility] Medical Record #[id number] Date of Birth: 10-07-1975  Referring: Rolan Ezra RAMAN, MD Primary Care: Pcp, No Primary Cardiologist:None  Chief Complaint:   No chief complaint on file.   History of Present Illness:     Philip Monroe is a 48 y.o. 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  He has been admitted in March, July and September with CHF exacerbation.  Most recently, he was treated with milrinone  and diuresis.  He was able to be discharged home after being weaned off milrinone .    Since discharge he has been doing okay.  He still has shortness of breath which she reports is stable and has not been doing much walking from home.  On his clinic visit today he was able to walk from the front door to my office but says it was very challenging and he had a lot of shortness of breath.  He lives with his mom and daughter in a trailer.  He has a history of LV thrombus and is on Eliquis .  MRI on September 2025 showed no LV thrombus.  He is right-handed.  He also thinks he has sleep apnea but he does not use a CPAP.  He has never had surgery.  In regards to having an LVAD, he is most sad about not being able to swim anymore as he loves swimming and going to the lake. The good news is, he has quit smoking and has not smoked since his admission in September.  TTE (11/26/23): LV EF < 20%, severely dilated LV. RV systolic function low normal. No AI, AS; trivial TR, mod MR, no MS cMRI (12/01/23): Severely dilated LV; LV EF 14%, RV EF 29%, normal RV size. 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  HbA1C 6.6% Anticoagulation: Eliquis   Creatinine:  Lab Results  Component Value Date   CREATININE 1.41 (H) 12/08/2023   CREATININE 1.41 (H) 12/04/2023   CREATININE 1.48 (H) 12/03/2023     Past Medical History:  Diagnosis Date   Aortic root dilatation    a. 07/2022 Echo: Ao root 4.4cm, Asc Ao 4.0cm.   CAD (coronary artery disease)    a. 08/2017 Cath: LM nl, LAD 30m/d, D1 70ost, LCX 152m, OM1 90 inf branch, 60 sup branch, OM2 small, RCA 30 diffuse, RPDA 100 w/ L->R collats; b. 07/2022 Echo: LM nl, LAD 52m, D1 80, LCX 14m, OM1 80, RCA 25m, RPDA 100-->No targets for PCI/CABG-->Med rx.   CHF (congestive heart failure) (HCC)    takes lasix    Chronic HFrEF (heart failure with reduced ejection fraction) (HCC)    a. 03/2015 Echo: EF 35-40%; b. 08/2017 Echo: EF 15%; c. 08/2017 cMRI: EF 35%; d. 06/2018 Echo: EF 10-15%; e. 07/2022 Echo: EF 15-20%, grIII DD, mild MR, sev dil LA, mod-sev PH, Ao root 4.4cm, Asc Ao 4.0cm; f. 07/2022 RHC: RA 4, RV 38/3, PA 43/18 (26), PCWP 11. CO/CI 5.3/1.8.   CKD (chronic  kidney disease), stage II    Gout    Hyperlipidemia LDL goal <70    Hypertension    Ischemic cardiomyopathy    a. 03/2015 Echo: EF 35-40%; b. 08/2017 Echo: EF 15%; c. 08/2017 cMRI: EF 35%; d. 06/2018 Echo: EF 10-15%; e. 07/2022 Echo: EF 15-20%, grIII DD.   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 09/2022 Event monitor: predominantly sinus rhythm, avg HR 81 (46-167). 17 runs NSVT (fastest/longest 13 beats x 167). Rare PACs, 17.5% PVC burden.   PVC's (premature ventricular contractions)    a. 09/2022 Event Monitor: 17.5% PVC burden.   Statin intolerance    Tobacco abuse     Past Surgical History:  Procedure Laterality Date   RIGHT/LEFT HEART CATH AND CORONARY ANGIOGRAPHY N/A 11/28/2023    Procedure: RIGHT/LEFT HEART CATH AND CORONARY ANGIOGRAPHY;  Surgeon: Rolan Ezra RAMAN, MD;  Location: [facility] INVASIVE CV LAB;  Service: Cardiovascular;  Laterality: N/A;    Social History:  Social History   Tobacco Use  Smoking Status Every Day   Current packs/day: 0.00   Types: Cigarettes   Last attempt to quit: 03/20/2015   Years since quitting: 8.7  Smokeless Tobacco Never    Social History   Substance and Sexual Activity  Alcohol Use Not Currently   Alcohol/week: 36.0 standard drinks of alcohol   Types: 36 Cans of beer per week     Allergies  Allergen Reactions   Lactose Intolerance (Gi) Diarrhea   Statins     myalgias    Current Outpatient Medications  Medication Sig Dispense Refill   allopurinol  (ZYLOPRIM ) 100 MG tablet Take 1 tablet (100 mg total) by mouth daily. 30 tablet 2   colchicine  0.6 MG tablet Take 1 tablet (0.6 mg total) by mouth 2 (two) times daily. 60 tablet 6   digoxin  (LANOXIN ) 0.125 MG tablet Take 1 tablet (0.125 mg total) by mouth daily. 30 tablet 1   ELIQUIS  5 MG TABS tablet Take 1 tablet (5 mg total) by mouth 2 (two) times daily. 60 tablet 1   empagliflozin  (JARDIANCE ) 10 MG TABS tablet Take 1 tablet (10 mg total) by mouth daily. 30 tablet 1   ezetimibe  (ZETIA ) 10 MG tablet Take 1 tablet (10 mg total) by mouth at bedtime. 90 tablet 0   mexiletine (MEXITIL ) 150 MG capsule Take 2 capsules (300 mg total) by mouth 2 (two) times daily. 120 capsule 0   Multiple Vitamin (MULTIVITAMIN WITH MINERALS) TABS tablet Take 1 tablet by mouth daily. (Patient not taking: Reported on 12/08/2023) 90 tablet 2   sacubitril -valsartan  (ENTRESTO ) 49-51 MG Take 1 tablet by mouth 2 (two) times daily. 60 tablet 6   spironolactone  (ALDACTONE ) 25 MG tablet Take 1 tablet (25 mg total) by mouth daily. 30 tablet 1   torsemide  (DEMADEX ) 20 MG tablet Take 2 tablets (40 mg total) by mouth daily. 30 tablet 1   No current facility-administered medications for this visit.    (Not in a  hospital admission)   No family history on file.   Review of Systems:   Review of Systems  Constitutional:  Positive for chills, diaphoresis and malaise/fatigue. Negative for fever.  Respiratory:  Positive for shortness of breath. Negative for cough.   Cardiovascular:  Positive for leg swelling. Negative for chest pain.  Gastrointestinal:  Negative for nausea and vomiting.  Neurological:  Negative for dizziness and headaches.  Physical Exam: There were no vitals taken for this visit. Physical Exam Constitutional:      General: He is not in acute distress.    Appearance: Normal appearance.  HENT:     Head: Normocephalic and atraumatic.  Cardiovascular:     Rate and Rhythm: Normal rate and regular rhythm.  Pulmonary:     Effort: Pulmonary effort is normal.     Breath sounds: Normal breath sounds.  Abdominal:     General: There is no distension.     Palpations: Abdomen is soft.     Tenderness: There is no abdominal tenderness.  Skin:    General: Skin is warm and dry.  Neurological:     Mental Status: He is alert.       Diagnostic Studies & Laboratory data: Cardiac Studies & Procedures   ______________________________________________________________________________________________ CARDIAC CATHETERIZATION  CARDIAC CATHETERIZATION 11/28/2023  Conclusion   RPAV-1 lesion is 80% stenosed.   Mid RCA lesion is 60% stenosed.   RPAV-2 lesion is 90% stenosed.   RPDA lesion is 100% stenosed.   Mid Cx lesion is 100% stenosed.   1st Diag lesion is 70% stenosed.   Dist LAD lesion is 40% stenosed.   Mid LAD lesion is 40% stenosed.   Ramus lesion is 90% stenosed.  1. Severely elevated left- and right-sided filling pressures. 2. 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 3. Low cardiac output by Fick and thermodilution. 4. Severe diffuse CAD.  Occluded proximal PDA and mid AV LCx.  Severe diffuse disease right PLV and 90% proximal ramus.  There is  nonobstructive disease in the LAD.  Start milrinone  and diurese with Lasix  gtt.  Place PICC.  Medical management of CAD.  With markedly low EF and without severe LAD disease, I do not think CABG is a good option.  Findings Coronary Findings Diagnostic  Dominance: Right  Left Anterior Descending Mid LAD lesion is 40% stenosed. Dist LAD lesion is 40% stenosed.  First Diagonal Branch 1st Diag lesion is 70% stenosed.  Ramus Intermedius Ramus lesion is 90% stenosed.  Left Circumflex Mid Cx lesion is 100% stenosed.  Right Coronary Artery Mid RCA lesion is 60% stenosed.  Right Posterior Descending Artery Collaterals RPDA filled by collaterals from Dist LAD.  RPDA lesion is 100% stenosed.  Right Posterior Atrioventricular Artery RPAV-1 lesion is 80% stenosed. RPAV-2 lesion is 90% stenosed.  Intervention  No interventions have been documented.   STRESS TESTS  NM MYOCAR MULTI W/SPECT W 04/03/2015  Narrative  There was no ST segment deviation noted during stress.  moderate global hypo EF= 35%  persistent apical defect probably no ischemia  Defect 1: There is a small defect of mild severity present in the apex location.  This is a low risk study.  The left ventricular ejection fraction is moderately decreased (30-44%).  Nuclear stress EF: 35%.  Conclusion mild apical thinning and defect no clear ischemia ejection fraction around 35% globally depressed   ECHOCARDIOGRAM  ECHOCARDIOGRAM COMPLETE 11/26/2023  Narrative ECHOCARDIOGRAM REPORT    Patient Name:   Philip Monroe Date of Exam: 11/26/2023 Medical Rec #:  [id number]      Height:       75.0 in Accession #:    [id number]     Weight:       302.0 lb Date of Birth:  Aug 12, 1975      BSA:          2.615 m Patient Age:    72 years  BP:           100/59 mmHg Patient Gender: M              HR:           71 bpm. Exam Location:  [facility]  Procedure: 2D Echo, Cardiac Doppler, Color Doppler and  Intracardiac Opacification Agent (Both Spectral and Color Flow Doppler were utilized during procedure).  Indications:     CHF I50.21  History:         Patient has prior history of Echocardiogram examinations, most recent 05/20/2023.  Sonographer:     Thedora Louder RDCS, FASE Referring Phys:  [id number]AU Diagnosing Phys: Shelda Bruckner MD   Sonographer Comments: Technically difficult study due to poor echo windows. Image acquisition challenging due to patient body habitus. IMPRESSIONS   1. Left ventricular ejection fraction, by estimation, is <20%. The left ventricle has severely decreased function. The left ventricle demonstrates global hypokinesis. The left ventricular internal cavity size was severely dilated. Indeterminate diastolic filling due to E-A fusion. 2. Right ventricular systolic function is low normal. The right ventricular size is moderately enlarged. 3. Left atrial size was severely dilated. 4. Right atrial size was moderately dilated. 5. The mitral valve is normal in structure. Moderate mitral valve regurgitation. No evidence of mitral stenosis. 6. The aortic valve is grossly normal. Aortic valve regurgitation is not visualized. No aortic stenosis is present. 7. The inferior vena cava is dilated in size with <50% respiratory variability, suggesting right atrial pressure of 15 mmHg.  Comparison(s): Changes from prior study are noted. EF remains severely reduced, LV has further dilated.  Conclusion(s)/Recommendation(s): No left ventricular mural or apical thrombus/thrombi.  FINDINGS Left Ventricle: Left ventricular ejection fraction, by estimation, is <20%. The left ventricle has severely decreased function. The left ventricle demonstrates global hypokinesis. Definity  contrast agent was given IV to delineate the left ventricular endocardial borders. The left ventricular internal cavity size was severely dilated. There is no left ventricular hypertrophy.  Indeterminate diastolic filling due to E-A fusion.  Right Ventricle: The right ventricular size is moderately enlarged. Right vetricular wall thickness was not well visualized. Right ventricular systolic function is low normal.  Left Atrium: Left atrial size was severely dilated.  Right Atrium: Right atrial size was moderately dilated.  Pericardium: Trivial pericardial effusion is present.  Mitral Valve: The mitral valve is normal in structure. Moderate mitral valve regurgitation. No evidence of mitral valve stenosis.  Tricuspid Valve: The tricuspid valve is grossly normal. Tricuspid valve regurgitation is trivial. No evidence of tricuspid stenosis.  Aortic Valve: The aortic valve is grossly normal. Aortic valve regurgitation is not visualized. No aortic stenosis is present. Aortic valve peak gradient measures 7.8 mmHg.  Pulmonic Valve: The pulmonic valve was grossly normal. Pulmonic valve regurgitation is trivial. No evidence of pulmonic stenosis.  Aorta: The aortic root, ascending aorta and aortic arch are all structurally normal, with no evidence of dilitation or obstruction.  Venous: The inferior vena cava is dilated in size with less than 50% respiratory variability, suggesting right atrial pressure of 15 mmHg.  IAS/Shunts: The atrial septum is grossly normal.   LEFT VENTRICLE PLAX 2D LVIDd:         10.00 cm     Diastology LVIDs:         9.10 cm      LV e' medial:    5.77 cm/s LV PW:         1.10 cm      LV  E/e' medial:  23.4 LV IVS:        1.10 cm      LV e' lateral:   5.44 cm/s LVOT diam:     2.20 cm      LV E/e' lateral: 24.8 LV SV:         91 LV SV Index:   35 LVOT Area:     3.80 cm  LV Volumes (MOD) LV vol d, MOD A2C: 465.5 ml LV vol d, MOD A4C: 464.5 ml LV vol s, MOD A2C: 366.0 ml LV vol s, MOD A4C: 340.0 ml LV SV MOD A2C:     99.5 ml LV SV MOD A4C:     464.5 ml LV SV MOD BP:      99.7 ml  RIGHT VENTRICLE RV Basal diam:  4.84 cm RV S prime:     10.40  cm/s TAPSE (M-mode): 2.5 cm  LEFT ATRIUM              Index        RIGHT ATRIUM           Index LA diam:        6.50 cm  2.49 cm/m   RA Area:     28.80 cm LA Vol (A2C):   133.0 ml 50.87 ml/m  RA Volume:   95.60 ml  36.56 ml/m LA Vol (A4C):   152.0 ml 58.14 ml/m LA Biplane Vol: 153.0 ml 58.52 ml/m AORTIC VALVE                 PULMONIC VALVE AV Area (Vmax): 3.31 cm     PV Vmax:        0.89 m/s AV Vmax:        140.00 cm/s  PV Peak grad:   3.2 mmHg AV Peak Grad:   7.8 mmHg     RVOT Peak grad: 5 mmHg LVOT Vmax:      122.00 cm/s LVOT Vmean:     81.500 cm/s LVOT VTI:       0.239 m  AORTA Ao Root diam: 3.80 cm Ao Asc diam:  3.20 cm  MITRAL VALVE MV Area (PHT): 4.49 cm       SHUNTS MV Decel Time: 169 msec       Systemic VTI:  0.24 m MR Peak grad:    88.0 mmHg    Systemic Diam: 2.20 cm MR Mean grad:    55.5 mmHg MR Vmax:         469.00 cm/s MR Vmean:        352.0 cm/s MR PISA:         2.26 cm MR PISA Eff ROA: 14 mm MR PISA Radius:  0.60 cm MV E velocity: 135.00 cm/s  Shelda Bruckner MD Electronically signed by Shelda Bruckner MD Signature Date/Time: 11/26/2023/2:40:25 PM    Final    MONITORS  LONG TERM MONITOR (3-14 DAYS) 07/25/2023  Narrative Patch Wear Time:  4 days and 19 hours (2025-04-22T15:55:14-0400 to 2025-04-27T11:34:22-0400)  1. Sinus rhythm - avg HR of 79 bpm. 2. Bundle Branch Block/IVCD was present. 3. 106 runs of nonsustained Ventricular Tachycardia occurred, the run with the fastest interval lasting 9 beats with a max rate of 261 bpm, the longest lasting 19 beats with an avg rate of 124 bpm. 4. Three runs of Supraventricular Tachycardia occurred, the run with the fastest interval lasting 4 beats with a max rate of 129 bpm, the longest lasting 6 beats with an avg rate of  114 bpm 5. Frequent PVCs (20.8%, 71521), PVC Couplets were occasional (3.1%, 5387), and PVC Triplets were occasional (1.4%, 1661). 6. Ventricular Bigeminy and Trigeminy were  present.  Toribio Fuel, MD 7:59 PM     CARDIAC MRI  MR CARDIAC MORPHOLOGY W WO CONTRAST 11/30/2023  Narrative CLINICAL DATA:  Cardiomyopathy  EXAM: CARDIAC MRI  TECHNIQUE: The patient was scanned on a 1.5 Tesla GE magnet. A dedicated cardiac coil was used. Functional imaging was done using Fiesta sequences. 2,3, and 4 chamber views were done to assess for RWMA's. Modified Simpson's rule using a short axis stack was used to calculate an ejection fraction on a dedicated work [employer]. The patient received 10 cc of Gadavist . After 10 minutes inversion recovery sequences were used to assess for infiltration and scar tissue.  FINDINGS: Limited images of the lung fields showed no gross abnormalities.  Technically difficult images due to trouble with breath-holding.  Severely dilated left ventricle with normal wall thickness. Diffuse hypokinesis with akinesis of the mid inferior and inferolateral walls, LV EF 14%. Prominent trabeculations towards apex, end diastolic noncompacted/compacted ratio measured 1.8 - 2.2. Normal right ventricular size with RV EF 29%. Severe left atrial enlargement, moderate right atrial enlargement. Trileaflet aortic valve with no significant stenosis or regurgitation. Visually, mitral regurgitation looks moderate. Regurgitant fraction not calculated as aortic flow sequence has significant artifact.  On delayed enhancement imaging, there was mid-wall late gadolinium enhancement (LGE) in the basal septum. There was >50% mid-wall LGE in the mid inferior and mid inferolateral walls.  MEASUREMENTS: MEASUREMENTS LVEDV 631 mL LVEDVi 232 mL/m2  LVSV 88 mL LVEF 14%  RVEDV 231 mL  RVEDVi 85 mL/m2  RVSV 68 mL RVEF 29%  Due to artifact, I do not think that flow sequences are accurate.  Global T1 1012, ECV 34%  IMPRESSION: 1. Severely dilated left ventricle with normal wall thickness. Diffuse hypokinesis with akinesis of  the mid inferior and inferolateral walls, LV EF 14%. Prominent trabeculations but not quite reaching the MRI definition of noncompaction (noncompacted/compacted radio 2.3).  2.  Normal RV size with RV EF 29%.  3. On delayed enhancement imaging, there was evidence for prior myocardial infarction in the mid inferior/mid inferolateral walls, suspect this area is nonviable. There is mid-wall LGE in the basal septum, suggesting a possible nonischemic cardiomyopathic process.  4. Extracellular volume percentage 34%, suggesting elevated myocardial fibrotic content.  Suspect mixed ischemic/nonischemic cardiomyopathy.  Dalton Mclean   Electronically Signed By: Ezra Shuck M.D. On: 12/01/2023 07:54   ______________________________________________________________________________________________     EKG: NSR with bigeminal PVCs   I have independently reviewed the above radiologic studies and discussed with the patient   Recent Lab Findings: Lab Results  Component Value Date   WBC 7.2 12/08/2023   HGB 16.3 12/08/2023   HCT 51.4 12/08/2023   PLT 297 12/08/2023   GLUCOSE 113 (H) 12/08/2023   CHOL 190 12/08/2023   TRIG 107 12/08/2023   HDL 39 (L) 12/08/2023   LDLCALC 130 (H) 12/08/2023   ALT 52 (H) 12/08/2023   AST 35 12/08/2023   NA 133 (L) 12/08/2023   K 4.2 12/08/2023   CL 100 12/08/2023   CREATININE 1.41 (H) 12/08/2023   BUN 23 (H) 12/08/2023   CO2 24 12/08/2023   TSH 2.816 12/08/2023   INR 1.2 12/08/2023   HGBA1C 6.6 (H) 11/29/2023      Assessment / Plan:   Philip Monroe is a 48 year old man with history of mixed severe cardiomyopathy  who presents for LVAD evaluation.  He is ambulatory and independent in his ADLs although significantly limited by his shortness of breath.  He has quit smoking but his weight currently disqualifies him from heart transplant.  He seems to have a good understanding of what life with LVAD would look like.  He brought his son today so that  he could get familiarized with what having an LVAD entails.  I think Philip Monroe is a good LVAD candidate.   My concerns include: His obesity, which puts him at higher risk for postoperative complications His 4.5 cm root aneurysm, although I don't think this disqualifies him for LVAD His very high PA pressures (dilated PA on imaging), likely partially due to untreated sleep apnea but I suppose it's encouraging that his RV can even generate PA pressures that high  I informed him that we would further discuss his case at the next [facility] meeting and be in touch with him regarding the recommendations.  I discussed the general nature of the procedure, including the need for general anesthesia, the incisions to be used, the use of cardiopulmonary bypass, and the use of temporary pacemaker wires and drainage tubes postoperatively with Philip Monroe.  We discussed the expected hospital stay, overall recovery and short and long term outcomes. I informed him of the indications, risks, benefits and alternatives.   He understands the risks include, but are not limited to death, stroke, MI, DVT/PE, bleeding, possible need for transfusion, infections, cardiac arrhythmias, as well as other organ system dysfunction including respiratory (eg: prolonged ventilation), renal, or GI complications.   I  spent 45 minutes counseling the patient face to face.   Con RAMAN Damarian Priola 12/21/2023 2:17 PM

## 2023-12-25 ENCOUNTER — Ambulatory Visit (HOSPITAL_COMMUNITY)
Admission: RE | Admit: 2023-12-25 | Discharge: 2023-12-25 | Disposition: A | Discharge status: Home / Self Care | Payer: MEDICAID | Source: Ambulatory Visit | Attending: Internal Medicine | Admitting: Internal Medicine

## 2023-12-25 ENCOUNTER — Telehealth (HOSPITAL_COMMUNITY): Payer: Self-pay | Admitting: Licensed Clinical Social Worker

## 2023-12-25 ENCOUNTER — Encounter (HOSPITAL_COMMUNITY): Payer: Self-pay | Admitting: Internal Medicine

## 2023-12-25 ENCOUNTER — Other Ambulatory Visit (HOSPITAL_COMMUNITY): Payer: Self-pay | Admitting: *Deleted

## 2023-12-25 VITALS — BP 106/69 | HR 82 | Wt 323.0 lb

## 2023-12-25 DIAGNOSIS — I2510 Atherosclerotic heart disease of native coronary artery without angina pectoris: Secondary | ICD-10-CM

## 2023-12-25 DIAGNOSIS — Z01818 Encounter for other preprocedural examination: Secondary | ICD-10-CM

## 2023-12-25 DIAGNOSIS — Z59868 Other specified financial insecurity: Secondary | ICD-10-CM | POA: Insufficient documentation

## 2023-12-25 DIAGNOSIS — Z87891 Personal history of nicotine dependence: Secondary | ICD-10-CM | POA: Insufficient documentation

## 2023-12-25 DIAGNOSIS — Z7901 Long term (current) use of anticoagulants: Secondary | ICD-10-CM | POA: Insufficient documentation

## 2023-12-25 DIAGNOSIS — I493 Ventricular premature depolarization: Secondary | ICD-10-CM | POA: Diagnosis not present

## 2023-12-25 DIAGNOSIS — I472 Ventricular tachycardia, unspecified: Secondary | ICD-10-CM | POA: Diagnosis not present

## 2023-12-25 DIAGNOSIS — I272 Pulmonary hypertension, unspecified: Secondary | ICD-10-CM | POA: Insufficient documentation

## 2023-12-25 DIAGNOSIS — R008 Other abnormalities of heart beat: Secondary | ICD-10-CM | POA: Insufficient documentation

## 2023-12-25 DIAGNOSIS — Z79899 Other long term (current) drug therapy: Secondary | ICD-10-CM | POA: Insufficient documentation

## 2023-12-25 DIAGNOSIS — Z6841 Body Mass Index (BMI) 40.0 and over, adult: Secondary | ICD-10-CM | POA: Diagnosis not present

## 2023-12-25 DIAGNOSIS — I110 Hypertensive heart disease with heart failure: Secondary | ICD-10-CM | POA: Diagnosis not present

## 2023-12-25 DIAGNOSIS — M109 Gout, unspecified: Secondary | ICD-10-CM | POA: Diagnosis not present

## 2023-12-25 DIAGNOSIS — I252 Old myocardial infarction: Secondary | ICD-10-CM | POA: Diagnosis not present

## 2023-12-25 DIAGNOSIS — I5043 Acute on chronic combined systolic (congestive) and diastolic (congestive) heart failure: Secondary | ICD-10-CM

## 2023-12-25 DIAGNOSIS — I5022 Chronic systolic (congestive) heart failure: Secondary | ICD-10-CM | POA: Insufficient documentation

## 2023-12-25 DIAGNOSIS — G4733 Obstructive sleep apnea (adult) (pediatric): Secondary | ICD-10-CM | POA: Diagnosis not present

## 2023-12-25 DIAGNOSIS — Z5982 Transportation insecurity: Secondary | ICD-10-CM | POA: Diagnosis not present

## 2023-12-25 LAB — BASIC METABOLIC PANEL WITH GFR
Anion gap: 11 (ref 5–15)
BUN: 30 mg/dL — ABNORMAL HIGH (ref 6–20)
CO2: 24 mmol/L (ref 22–32)
Calcium: 9.1 mg/dL (ref 8.9–10.3)
Chloride: 99 mmol/L (ref 98–111)
Creatinine, Ser: 1.5 mg/dL — ABNORMAL HIGH (ref 0.61–1.24)
GFR, Estimated: 57 mL/min — ABNORMAL LOW (ref >60)
Glucose, Bld: 106 mg/dL — ABNORMAL HIGH (ref 70–99)
Potassium: 3.5 mmol/L (ref 3.5–5.1)
Sodium: 134 mmol/L — ABNORMAL LOW (ref 135–145)

## 2023-12-25 LAB — BRAIN NATRIURETIC PEPTIDE: B Natriuretic Peptide: 315.6 pg/mL — ABNORMAL HIGH (ref 0.0–100.0)

## 2023-12-25 MED ORDER — SACUBITRIL-VALSARTAN 24-26 MG PO TABS: 1.0000 | ORAL_TABLET | Freq: Two times a day (BID) | ORAL | 6 refills | Status: DC

## 2023-12-25 NOTE — H&P (View-Only) (Signed)
 Advanced Heart Failure Clinic Note    Primary Physician: [facility] HF Cardiologist:  DB  Chief Complaint: HF  HPI:  Philip Monroe is a 48 y.o.. Obese male with multivessel CAD, chronic HFrEF, inoncompaction CM, HTN,  previous ETOH use, noncompliance.    He was followed in the past by [facility] cardiology for iCM/noncompaction CM. RHC 5/24: Normal LV and RV filling pressures. Mild pulmonary hypertension with mild elevation of PVL. Low cardiac index 1.8. LHC 5/24: Severe multivessel CAD including 80% mid RCA, 100% RPDA, 100% mid LCx, 80% OM1, 80% mid-distal LAD. Medically managed.    Admitted 05/19/23 with progressive HF symptoms after being noncompliant with meds. Echo EF <20%, LV with GHK, LV severely dilated, RV mod reduced, LA severely dilated, mild-mod MR. Diuresed well with IV lasix . GDMT restarted. D/c weight 360 pounds.     Zio monitor (5/25) with 25.3% PVCs and frequent NSVT. Mexiletine started.   He was admitted 7/25 with CHF.   He was admitted in 9/25 with CHF exacerbation. Echo showed EF < 20%, low normal RV function, moderate MR.  LHC/RHC showed severely elevated filling pressures, low CO by Fick and thermodilution, severe diffuse CAD with occluded proximal PDA and mid AV LCx, severe diffuse disease in the PLV and 90% proximal ramus stenosis with nonobstructive LAD disease.  Cardiac MRI showed severely dilated LV with LV EF 14%, RV EF 29%, delayed enhancement showed evidence for prior MI in the mid inferior and mid inferolateral walls, also mid-wall LGE in the basal septum. Patient was started on milrinone  and diuresed, milrinone  was titrated off.    He presents today forf/u in LVAD clinic. He is here with a friend. At last visit Entresto  increased. Remains very fatigued. Struggles with ADLs. Edema has been well controlled. Compliant with all meds. Not smoking or drinking.   Labs (9/25): Hgb 16.6, K 4.3, creatinine 1.4   ROS: All systems negative except  what is listed in HPI, PMH and Problem List    Past Medical History:  Diagnosis Date   Aortic root dilatation    a. 07/2022 Echo: Ao root 4.4cm, Asc Ao 4.0cm.   CAD (coronary artery disease)    a. 08/2017 Cath: LM nl, LAD 66m/d, D1 70ost, LCX 145m, OM1 90 inf branch, 60 sup branch, OM2 small, RCA 30 diffuse, RPDA 100 w/ L->R collats; b. 07/2022 Echo: LM nl, LAD 6m, D1 80, LCX 139m, OM1 80, RCA 64m, RPDA 100-->No targets for PCI/CABG-->Med rx.   CHF (congestive heart failure) (HCC)    takes lasix    Chronic HFrEF (heart failure with reduced ejection fraction) (HCC)    a. 03/2015 Echo: EF 35-40%; b. 08/2017 Echo: EF 15%; c. 08/2017 cMRI: EF 35%; d. 06/2018 Echo: EF 10-15%; e. 07/2022 Echo: EF 15-20%, grIII DD, mild MR, sev dil LA, mod-sev PH, Ao root 4.4cm, Asc Ao 4.0cm; f. 07/2022 RHC: RA 4, RV 38/3, PA 43/18 (26), PCWP 11. CO/CI 5.3/1.8.   CKD (chronic kidney disease), stage II    Gout    Hyperlipidemia LDL goal <70    Hypertension    Ischemic cardiomyopathy    a. 03/2015 Echo: EF 35-40%; b. 08/2017 Echo: EF 15%; c. 08/2017 cMRI: EF 35%; d. 06/2018 Echo: EF 10-15%; e. 07/2022 Echo: EF 15-20%, grIII DD.   IVCD (intraventricular conduction defect)    Left ventricular noncompaction [facility])    Morbid obesity (HCC)    NSVT (nonsustained ventricular tachycardia) (HCC)    a. 09/2022 Event monitor: predominantly  sinus rhythm, avg HR 81 (46-167). 17 runs NSVT (fastest/longest 13 beats x 167). Rare PACs, 17.5% PVC burden.   PVC's (premature ventricular contractions)    a. 09/2022 Event Monitor: 17.5% PVC burden.   Statin intolerance    Tobacco abuse     Current Outpatient Medications  Medication Sig Dispense Refill   allopurinol  (ZYLOPRIM ) 100 MG tablet Take 1 tablet (100 mg total) by mouth daily. 30 tablet 2   colchicine  0.6 MG tablet Take 1 tablet (0.6 mg total) by mouth 2 (two) times daily. 60 tablet 6   digoxin  (LANOXIN ) 0.125 MG tablet Take 1 tablet (0.125 mg total) by mouth daily. 30 tablet 1   ELIQUIS   5 MG TABS tablet Take 1 tablet (5 mg total) by mouth 2 (two) times daily. 60 tablet 1   empagliflozin  (JARDIANCE ) 10 MG TABS tablet Take 1 tablet (10 mg total) by mouth daily. 30 tablet 1   ezetimibe  (ZETIA ) 10 MG tablet Take 1 tablet (10 mg total) by mouth at bedtime. 90 tablet 0   mexiletine (MEXITIL ) 150 MG capsule Take 2 capsules (300 mg total) by mouth 2 (two) times daily. 120 capsule 0   sacubitril -valsartan  (ENTRESTO ) 24-26 MG Take 1 tablet by mouth 2 (two) times daily. 60 tablet 6   spironolactone  (ALDACTONE ) 25 MG tablet Take 1 tablet (25 mg total) by mouth daily. 30 tablet 1   torsemide  (DEMADEX ) 20 MG tablet Take 2 tablets (40 mg total) by mouth daily. 30 tablet 1   Multiple Vitamin (MULTIVITAMIN WITH MINERALS) TABS tablet Take 1 tablet by mouth daily. (Patient not taking: Reported on 12/25/2023) 90 tablet 2   No current facility-administered medications for this encounter.    Allergies  Allergen Reactions   Lactose Intolerance (Gi) Diarrhea   Statins     myalgias      Social History   Socioeconomic History   Marital status: Single    Spouse name: Not on file   Number of children: 4   Years of education: Not on file   Highest education level: 10th grade  Occupational History   Not on file  Tobacco Use   Smoking status: Every Day    Current packs/day: 0.00    Types: Cigarettes    Last attempt to quit: 03/20/2015    Years since quitting: 8.7   Smokeless tobacco: Never  Vaping Use   Vaping status: Never Used  Substance and Sexual Activity   Alcohol use: Not Currently    Alcohol/week: 36.0 standard drinks of alcohol    Types: 36 Cans of beer per week   Drug use: No   Sexual activity: Yes    Birth control/protection: Coitus interruptus, Condom  Other Topics Concern   Not on file  Social History Narrative   Not on file   Social Drivers of Health   Financial Resource Strain: Medium Risk (12/13/2023)   Overall Financial Resource Strain (CARDIA)    Difficulty of  Paying Living Expenses: Somewhat hard  Food Insecurity: No Food Insecurity (11/25/2023)   Hunger Vital Sign    Worried About Running Out of Food in the Last Year: Never true    Ran Out of Food in the Last Year: Never true  Transportation Needs: Unmet Transportation Needs (12/13/2023)   PRAPARE - Administrator, Civil Service (Medical): Yes    Lack of Transportation (Non-Medical): Yes  Physical Activity: Not on file  Stress: Not on file  Social Connections: Not on file  Intimate Partner Violence: Not  At Risk (11/25/2023)   Humiliation, Afraid, Rape, and Kick questionnaire    Fear of Current or Ex-Partner: No    Emotionally Abused: No    Physically Abused: No    Sexually Abused: No     No family history on file.  Vitals:   12/25/23 1106  BP: 106/69  Pulse: 82  SpO2: 95%  Weight: (!) 146.5 kg (323 lb)    Wt Readings from Last 3 Encounters:  12/25/23 (!) 146.5 kg (323 lb)  12/21/23 (!) 145.4 kg (320 lb 8 oz)  12/08/23 (!) 145.7 kg (321 lb 3.2 oz)   Lab Results  Component Value Date   CREATININE 1.50 (H) 12/25/2023   CREATININE 1.41 (H) 12/08/2023   CREATININE 1.41 (H) 12/04/2023    PHYSICAL EXAM: General:  Well appearing. No resp difficulty HEENT: normal Neck: supple. no JVD. Carotids 2+ bilat; no bruits. No lymphadenopathy or thryomegaly appreciated. Cor: PMI nondisplaced. Regular rate & rhythm. No rubs, gallops or murmurs. Lungs: clear Abdomen: obese soft, nontender, nondistended. No hepatosplenomegaly. No bruits or masses. Good bowel sounds. Extremities: no cyanosis, clubbing, rash, edema Neuro: alert & orientedx3, cranial nerves grossly intact. moves all 4 extremities w/o difficulty. Affect pleasant   ASSESSMENT & PLAN:  1. Chronic systolic heart failure, likely combined ischemic and nonischemic: Mentions of compaction CM from [facility] cards notes.  RHC 05/24 showed normal LV and RV filling pressures, mild pulmonary hypertension with mild elevation of PVR,  low cardiac index 1.8.  Echo 3/25 with EF <20%, LV with GHK, LV severely dilated, RV mod reduced, LA severely dilated, mild-mod MR.  Echo 9/25 with EF < 20%, severe LV dilation, moderate RV enlargement with low normal systolic function, moderate MR, dilated IVC.  PVCs may also contribute, 25.3% PVCs with NSVT on Zio monitor in 5/25.  Re-admitted in 9/25.  Had been out of most medications.  The week prior to admission, he only took torsemide  twice due to gout pain and difficulty getting to the bathroom.  RHC showed markedly elevated filling pressures with low CI (1.6 Fick/1.77 thermo). Cardiac MRI showed LV EF 14%, RV EF 29%, suspect mixed ischemic/nonischemic cardiomyopathy with scarring process due to MI in the mid inferior/inferolateral wall and non-coronary type mid-wall LGE in the basal septum.  He was started on milrinone  and diuresed, then milrinone  weaned off.  Since discharge, he says that he has been taking all his meds.   - NYHA class IIIb-IV symptoms,volume ok - Continue torsemide  40 mg daily.  - Decrease Entresto  49/51 bid -> 24/26 bid due to worsening fatigue on higher dose.    - Continue digoxin , c  - spironolactone  25 mg daily.  - Off Toprol  XL with low output.  - Continue Jardiance  10 mg daily.  - With weight and smoking (quit < 1 month), not a candidate for heart transplant.   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  Low cardiac output on RHC 9/25.  Prior noncompliance with medications complicates situation. Compliance now much improved. We discussed LVAD therapy at length and he is interested in considering. He has seen Dr. Darthy Manganelli in [place] and appears to be suitable surgical candidate - We will have him meet with VAD patient and if he wants to proceed will present at [facility] - Will need teeth extracted prior to LVAD placement   2. CAD:  LHC 5/24 at [facility] showed severe multivessel CAD including 80% mid RCA, 100% RPDA, 100% mid LCx, 80% OM1, 80% mid-distal LAD. Medical management recommended by [facility]  cardiology 05/24.  Repeat coronary angiography in 9/25 showed occluded PDA and mid AV LCx, 90% ramus, severe diffuse disease in right PLV, nonobstructive LAD disease.  With markedly low EF and without severe LAD disease, CABG nor a durable option.  - No ASA given Eliquis  use and stable CAD.  - Continue Zetia , has not been able to tolerate statin.  Refer to lipid clinic for Repatha. Lp(a) is very elevated.  - Best future option will likely be LVAD.   3. PVCs: Zio 5/25 with 25.3%, NSVT noted burden.  He did not tolerate amiodarone  well and was started on mexiletine. ECG today with ventricular bigeminy.  - Continue mexilitene   - Will need sleep study.   4. Morbid obesity:  - Body mass index is 40.37 kg/m. - consider GLP1RA .  5. Tobacco use:  - Has quit < 21month .  6. Anticoagulation: Possible LV thrombus by echo in the past. No LV thrombus on cardiac MRI 9/25.  -  Continue Eliquis .   7. Gout: Very symptomatic, knees and ankles.   - Continue allopurinol  and colchicine  - Can refer to Rheum as needed    I spent a total of 51 minutes today: 1) reviewing the patient's medical records including previous charts, labs and recent notes from other providers; 2) examining the patient and counseling them on their medical issues/explaining the plan of care; 3) adjusting meds as needed and 4) ordering lab work or other needed tests.    Toribio Fuel, MD 12/25/23

## 2023-12-25 NOTE — Progress Notes (Addendum)
 Patient presents for VAD eval f/u in VAD Clinic today with his best friend Madeleine and daughter Madelyne.   Reports that he has been feeling increasingly weak since his last appt. He is having to take frequent rest breaks with simple activities such as washing the dishes. He is intermittently dizzy/lightheaded. Entresto  was increased last visit. Per Dr Cherrie decrease Entresto  to 24-26 mg BID. Updated prescription sent to pt's pharmacy. Pt verbalized understanding of medication change. Denies worsening shortness of breath. Reports good urine output, but feels that his urine stream is weaker, and that he was putting out more urine when taking Lasix  vs Torsemide . Fluid status stable today.  Dr Cherrie had long conversation regarding pt's symptoms, and timing of VAD. Discussed that we will present him at [facility] this week, and once approved pt will need to make a decision about moving forward. Pt has good family/friend support. At this time he thinks he will say yes to moving forward with surgery. Will contact him after MRB this week.   Pt needs dental extractions pre-VAD implant. Pt instructed to call Dr Terrance office for dental eval. He verbalized understanding.   Social eval completed with Jenna Uris CSW. See her note for documentation.  Plan for pt to meet with current VAD pt on Friday.               Vital Signs:  HR: 82 SR w/ PVCs BP: 106/69 (79) SPO2: 95%   Weight: 323 lb  Last weight: 321.2 lb Home weights:  lbs   Symptom YES NO DETAILS  Angina x  Activity:  Claudication  x How Far:  Syncope  x When:  Stroke  x   Orthopnea  x How many pillows:  PND x  How often:  CPAP  x How many hours:  Pedal Edema  x   Abdominal Fullness x    Nausea / Vomit  x Decreased appetite  Diaphoresis  x When:  Shortness of Breath x  Activity: with minimal activity  Palpitations x  When:  ICD shock N/a    Bleeding S/S  x   Tea-colored Urine  x   Hospitalizations  x   Emergency Room  x   Other MD      Activity   Fluid   Diet    Device: n/a  Patient Instructions:  Decrease Entresto  to 24-26 mg twice daily Call VAD coordinators if you have any questions or if your symptoms worsen- [phone] We will discuss you in our Medical Review Board meeting on Wednesday- we will call you with decision Please call Dr Terrance office to schedule dental evaluation   Isaiah Knoll RN VAD Coordinator  Office: [phone]  24/7 Pager: [phone]

## 2023-12-25 NOTE — Patient Instructions (Signed)
 Decrease Entresto  to 24-26 mg twice daily Call VAD coordinators if you have any questions or if your symptoms worsen- [phone] We will discuss you in our Medical Review Board meeting on Wednesday- we will call you with decision Please call Dr Terrance office to schedule dental evaluation

## 2023-12-25 NOTE — Telephone Encounter (Signed)
 H&V Care Navigation CSW Progress Note  Clinical Social Worker follow up with patient by phone regarding concerns with disability, mental health, and household income.  CSW had [facility] to get update regarding his case- they confirmed they are still following patient and that case is now assigned to Lori Sambol- patient informed of this.  CSW emailed pt list of in network mental health providers and local mental health urgent care center.  Encouraged pt to inform CSW if he needs assistance in calling providers to arrange appt.  CSW revisited pt financial situation with patient as we will be discussing his case on Wednesday in MRB.  Pt had reported he is only source of income in household.  Pt now reporting that his daughters boyfriend contributes to expenses as he lives there part time.  Continues to be confident that family or friends can help him upkeep expenses while he is out of work.  CSW also discussed if patient mom is eligible for social security- he is unsure but will help her call social security to discuss if she could be drawing benefits as she is over 70.  Will continue to follow and assist as needed  Philip HILARIO Leech, LCSW Clinical Social Worker Advanced Heart Failure Clinic Desk#: [phone] Cell#: [phone]

## 2023-12-25 NOTE — Progress Notes (Addendum)
 Advanced Heart Failure Clinic Note    Primary Physician: [facility] HF Cardiologist:  DB  Chief Complaint: HF  HPI:  Philip Monroe is a 48 y.o.. Obese male with multivessel CAD, chronic HFrEF, inoncompaction CM, HTN,  previous ETOH use, noncompliance.    He was followed in the past by [facility] cardiology for iCM/noncompaction CM. RHC 5/24: Normal LV and RV filling pressures. Mild pulmonary hypertension with mild elevation of PVL. Low cardiac index 1.8. LHC 5/24: Severe multivessel CAD including 80% mid RCA, 100% RPDA, 100% mid LCx, 80% OM1, 80% mid-distal LAD. Medically managed.    Admitted 05/19/23 with progressive HF symptoms after being noncompliant with meds. Echo EF <20%, LV with GHK, LV severely dilated, RV mod reduced, LA severely dilated, mild-mod MR. Diuresed well with IV lasix . GDMT restarted. D/c weight 360 pounds.     Zio monitor (5/25) with 25.3% PVCs and frequent NSVT. Mexiletine started.   He was admitted 7/25 with CHF.   He was admitted in 9/25 with CHF exacerbation. Echo showed EF < 20%, low normal RV function, moderate MR.  LHC/RHC showed severely elevated filling pressures, low CO by Fick and thermodilution, severe diffuse CAD with occluded proximal PDA and mid AV LCx, severe diffuse disease in the PLV and 90% proximal ramus stenosis with nonobstructive LAD disease.  Cardiac MRI showed severely dilated LV with LV EF 14%, RV EF 29%, delayed enhancement showed evidence for prior MI in the mid inferior and mid inferolateral walls, also mid-wall LGE in the basal septum. Patient was started on milrinone  and diuresed, milrinone  was titrated off.    He presents today forf/u in LVAD clinic. He is here with a friend. At last visit Entresto  increased. Remains very fatigued. Struggles with ADLs. Edema has been well controlled. Compliant with all meds. Not smoking or drinking.   Labs (9/25): Hgb 16.6, K 4.3, creatinine 1.4   ROS: All systems negative except  what is listed in HPI, PMH and Problem List    Past Medical History:  Diagnosis Date   Aortic root dilatation    a. 07/2022 Echo: Ao root 4.4cm, Asc Ao 4.0cm.   CAD (coronary artery disease)    a. 08/2017 Cath: LM nl, LAD 66m/d, D1 70ost, LCX 145m, OM1 90 inf branch, 60 sup branch, OM2 small, RCA 30 diffuse, RPDA 100 w/ L->R collats; b. 07/2022 Echo: LM nl, LAD 6m, D1 80, LCX 139m, OM1 80, RCA 64m, RPDA 100-->No targets for PCI/CABG-->Med rx.   CHF (congestive heart failure) (HCC)    takes lasix    Chronic HFrEF (heart failure with reduced ejection fraction) (HCC)    a. 03/2015 Echo: EF 35-40%; b. 08/2017 Echo: EF 15%; c. 08/2017 cMRI: EF 35%; d. 06/2018 Echo: EF 10-15%; e. 07/2022 Echo: EF 15-20%, grIII DD, mild MR, sev dil LA, mod-sev PH, Ao root 4.4cm, Asc Ao 4.0cm; f. 07/2022 RHC: RA 4, RV 38/3, PA 43/18 (26), PCWP 11. CO/CI 5.3/1.8.   CKD (chronic kidney disease), stage II    Gout    Hyperlipidemia LDL goal <70    Hypertension    Ischemic cardiomyopathy    a. 03/2015 Echo: EF 35-40%; b. 08/2017 Echo: EF 15%; c. 08/2017 cMRI: EF 35%; d. 06/2018 Echo: EF 10-15%; e. 07/2022 Echo: EF 15-20%, grIII DD.   IVCD (intraventricular conduction defect)    Left ventricular noncompaction [facility])    Morbid obesity (HCC)    NSVT (nonsustained ventricular tachycardia) (HCC)    a. 09/2022 Event monitor: predominantly  sinus rhythm, avg HR 81 (46-167). 17 runs NSVT (fastest/longest 13 beats x 167). Rare PACs, 17.5% PVC burden.   PVC's (premature ventricular contractions)    a. 09/2022 Event Monitor: 17.5% PVC burden.   Statin intolerance    Tobacco abuse     Current Outpatient Medications  Medication Sig Dispense Refill   allopurinol  (ZYLOPRIM ) 100 MG tablet Take 1 tablet (100 mg total) by mouth daily. 30 tablet 2   colchicine  0.6 MG tablet Take 1 tablet (0.6 mg total) by mouth 2 (two) times daily. 60 tablet 6   digoxin  (LANOXIN ) 0.125 MG tablet Take 1 tablet (0.125 mg total) by mouth daily. 30 tablet 1   ELIQUIS   5 MG TABS tablet Take 1 tablet (5 mg total) by mouth 2 (two) times daily. 60 tablet 1   empagliflozin  (JARDIANCE ) 10 MG TABS tablet Take 1 tablet (10 mg total) by mouth daily. 30 tablet 1   ezetimibe  (ZETIA ) 10 MG tablet Take 1 tablet (10 mg total) by mouth at bedtime. 90 tablet 0   mexiletine (MEXITIL ) 150 MG capsule Take 2 capsules (300 mg total) by mouth 2 (two) times daily. 120 capsule 0   sacubitril -valsartan  (ENTRESTO ) 24-26 MG Take 1 tablet by mouth 2 (two) times daily. 60 tablet 6   spironolactone  (ALDACTONE ) 25 MG tablet Take 1 tablet (25 mg total) by mouth daily. 30 tablet 1   torsemide  (DEMADEX ) 20 MG tablet Take 2 tablets (40 mg total) by mouth daily. 30 tablet 1   Multiple Vitamin (MULTIVITAMIN WITH MINERALS) TABS tablet Take 1 tablet by mouth daily. (Patient not taking: Reported on 12/25/2023) 90 tablet 2   No current facility-administered medications for this encounter.    Allergies  Allergen Reactions   Lactose Intolerance (Gi) Diarrhea   Statins     myalgias      Social History   Socioeconomic History   Marital status: Single    Spouse name: Not on file   Number of children: 4   Years of education: Not on file   Highest education level: 10th grade  Occupational History   Not on file  Tobacco Use   Smoking status: Every Day    Current packs/day: 0.00    Types: Cigarettes    Last attempt to quit: 03/20/2015    Years since quitting: 8.7   Smokeless tobacco: Never  Vaping Use   Vaping status: Never Used  Substance and Sexual Activity   Alcohol use: Not Currently    Alcohol/week: 36.0 standard drinks of alcohol    Types: 36 Cans of beer per week   Drug use: No   Sexual activity: Yes    Birth control/protection: Coitus interruptus, Condom  Other Topics Concern   Not on file  Social History Narrative   Not on file   Social Drivers of Health   Financial Resource Strain: Medium Risk (12/13/2023)   Overall Financial Resource Strain (CARDIA)    Difficulty of  Paying Living Expenses: Somewhat hard  Food Insecurity: No Food Insecurity (11/25/2023)   Hunger Vital Sign    Worried About Running Out of Food in the Last Year: Never true    Ran Out of Food in the Last Year: Never true  Transportation Needs: Unmet Transportation Needs (12/13/2023)   PRAPARE - Administrator, Civil Service (Medical): Yes    Lack of Transportation (Non-Medical): Yes  Physical Activity: Not on file  Stress: Not on file  Social Connections: Not on file  Intimate Partner Violence: Not  At Risk (11/25/2023)   Humiliation, Afraid, Rape, and Kick questionnaire    Fear of Current or Ex-Partner: No    Emotionally Abused: No    Physically Abused: No    Sexually Abused: No     No family history on file.  Vitals:   12/25/23 1106  BP: 106/69  Pulse: 82  SpO2: 95%  Weight: (!) 146.5 kg (323 lb)    Wt Readings from Last 3 Encounters:  12/25/23 (!) 146.5 kg (323 lb)  12/21/23 (!) 145.4 kg (320 lb 8 oz)  12/08/23 (!) 145.7 kg (321 lb 3.2 oz)   Lab Results  Component Value Date   CREATININE 1.50 (H) 12/25/2023   CREATININE 1.41 (H) 12/08/2023   CREATININE 1.41 (H) 12/04/2023    PHYSICAL EXAM: General:  Well appearing. No resp difficulty HEENT: normal Neck: supple. no JVD. Carotids 2+ bilat; no bruits. No lymphadenopathy or thryomegaly appreciated. Cor: PMI nondisplaced. Regular rate & rhythm. No rubs, gallops or murmurs. Lungs: clear Abdomen: obese soft, nontender, nondistended. No hepatosplenomegaly. No bruits or masses. Good bowel sounds. Extremities: no cyanosis, clubbing, rash, edema Neuro: alert & orientedx3, cranial nerves grossly intact. moves all 4 extremities w/o difficulty. Affect pleasant   ASSESSMENT & PLAN:  1. Chronic systolic heart failure, likely combined ischemic and nonischemic: Mentions of compaction CM from [facility] cards notes.  RHC 05/24 showed normal LV and RV filling pressures, mild pulmonary hypertension with mild elevation of PVR,  low cardiac index 1.8.  Echo 3/25 with EF <20%, LV with GHK, LV severely dilated, RV mod reduced, LA severely dilated, mild-mod MR.  Echo 9/25 with EF < 20%, severe LV dilation, moderate RV enlargement with low normal systolic function, moderate MR, dilated IVC.  PVCs may also contribute, 25.3% PVCs with NSVT on Zio monitor in 5/25.  Re-admitted in 9/25.  Had been out of most medications.  The week prior to admission, he only took torsemide  twice due to gout pain and difficulty getting to the bathroom.  RHC showed markedly elevated filling pressures with low CI (1.6 Fick/1.77 thermo). Cardiac MRI showed LV EF 14%, RV EF 29%, suspect mixed ischemic/nonischemic cardiomyopathy with scarring process due to MI in the mid inferior/inferolateral wall and non-coronary type mid-wall LGE in the basal septum.  He was started on milrinone  and diuresed, then milrinone  weaned off.  Since discharge, he says that he has been taking all his meds.   - NYHA class IIIb-IV symptoms,volume ok - Continue torsemide  40 mg daily.  - Decrease Entresto  49/51 bid -> 24/26 bid due to worsening fatigue on higher dose.    - Continue digoxin , c  - spironolactone  25 mg daily.  - Off Toprol  XL with low output.  - Continue Jardiance  10 mg daily.  - With weight and smoking (quit < 1 month), not a candidate for heart transplant.   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  Low cardiac output on RHC 9/25.  Prior noncompliance with medications complicates situation. Compliance now much improved. We discussed LVAD therapy at length and he is interested in considering. He has seen Dr. Darthy Manganelli in [place] and appears to be suitable surgical candidate - We will have him meet with VAD patient and if he wants to proceed will present at [facility] - Will need teeth extracted prior to LVAD placement   2. CAD:  LHC 5/24 at [facility] showed severe multivessel CAD including 80% mid RCA, 100% RPDA, 100% mid LCx, 80% OM1, 80% mid-distal LAD. Medical management recommended by [facility]  cardiology 05/24.  Repeat coronary angiography in 9/25 showed occluded PDA and mid AV LCx, 90% ramus, severe diffuse disease in right PLV, nonobstructive LAD disease.  With markedly low EF and without severe LAD disease, CABG nor a durable option.  - No ASA given Eliquis  use and stable CAD.  - Continue Zetia , has not been able to tolerate statin.  Refer to lipid clinic for Repatha. Lp(a) is very elevated.  - Best future option will likely be LVAD.   3. PVCs: Zio 5/25 with 25.3%, NSVT noted burden.  He did not tolerate amiodarone  well and was started on mexiletine. ECG today with ventricular bigeminy.  - Continue mexilitene   - Will need sleep study.   4. Morbid obesity:  - Body mass index is 40.37 kg/m. - consider GLP1RA .  5. Tobacco use:  - Has quit < 21month .  6. Anticoagulation: Possible LV thrombus by echo in the past. No LV thrombus on cardiac MRI 9/25.  -  Continue Eliquis .   7. Gout: Very symptomatic, knees and ankles.   - Continue allopurinol  and colchicine  - Can refer to Rheum as needed    I spent a total of 51 minutes today: 1) reviewing the patient's medical records including previous charts, labs and recent notes from other providers; 2) examining the patient and counseling them on their medical issues/explaining the plan of care; 3) adjusting meds as needed and 4) ordering lab work or other needed tests.    Toribio Fuel, MD 12/25/23

## 2023-12-25 NOTE — Progress Notes (Addendum)
 Caregiver questions Please explain what you hope will be improved about your life and loved one's life as a result of receiving the LVAD? Hope he will feel better and be able to do the things that he used to without getting tired.  What is your biggest concern or fear about caregiving with an LVAD patient? None at this time.  Unsure how to feel about what is expected of her until she does it.    What is your plan for availability to provide care 24/7 x2 weeks post op and dressing changes ongoing?Does not work at this time and provides caregiving for her daughter who also stays at home so not worried about ability to be present for patient during that first two weeks.  Confirms she can get caregiving for her daughter when she needs to come to trainings at the hospital etc.  Who is the relief/backup caregiver and what is their availability? [name]- patients mom- doesn't work and is home at all times.   Preferred method of learning? Hands on   Do you drive? no  Do you think you can do this? Yes  Is there anything that concerns about caregiving? Only worried if the patient would require too much physical assistance as she is much smaller than him.   Do you provide caregiving to anyone else? Her 89 year old daughter.     Caregiver's current level of motivation to prepare for LVAD: Fully ready to move forward with surgery if that is what the patient needs.  Eager to learn more about the procedure during visit with MD today.  Andriette HILARIO Leech, LCSW Clinical Social Worker Advanced Heart Failure Clinic Desk#: [phone] Cell#: [phone]

## 2023-12-29 ENCOUNTER — Telehealth (HOSPITAL_COMMUNITY): Payer: Self-pay | Admitting: Licensed Clinical Social Worker

## 2023-12-29 ENCOUNTER — Other Ambulatory Visit (HOSPITAL_COMMUNITY): Payer: Self-pay

## 2023-12-29 ENCOUNTER — Encounter (HOSPITAL_COMMUNITY): Payer: Self-pay

## 2023-12-29 ENCOUNTER — Ambulatory Visit (HOSPITAL_COMMUNITY)
Admission: RE | Admit: 2023-12-29 | Discharge: 2023-12-29 | Disposition: A | Discharge status: Home / Self Care | Payer: MEDICAID | Source: Ambulatory Visit | Attending: Internal Medicine | Admitting: Internal Medicine

## 2023-12-29 ENCOUNTER — Telehealth (HOSPITAL_COMMUNITY): Payer: Self-pay

## 2023-12-29 DIAGNOSIS — I5022 Chronic systolic (congestive) heart failure: Secondary | ICD-10-CM | POA: Diagnosis not present

## 2023-12-29 DIAGNOSIS — Z7901 Long term (current) use of anticoagulants: Secondary | ICD-10-CM | POA: Insufficient documentation

## 2023-12-29 DIAGNOSIS — Z01818 Encounter for other preprocedural examination: Secondary | ICD-10-CM | POA: Insufficient documentation

## 2023-12-29 LAB — HEPATITIS C ANTIBODY: HCV Ab: NONREACTIVE

## 2023-12-29 LAB — ABO/RH: ABO/RH(D): O POS

## 2023-12-29 NOTE — Telephone Encounter (Signed)
 Advanced Heart Failure Patient Advocate Encounter  Received notification that prior authorization is needed for Sacubitril -Valsartan . Test billing shows that this plan prefers brand, $4 copay for 90 days Entresto . Spoke to pharmacy to reprocess, confirmed $4 copay. No prior authorization submitted at this time.  Rachel DEL, CPhT Rx Patient Advocate Phone: [phone]

## 2023-12-29 NOTE — Progress Notes (Addendum)
 Pt met with current VAD pt in VAD clinic today.   Discussed need to stop Eliquis  10/23 in preparation for VAD implant 10/30. Last dose will be 10/22.   Will need to stop Entresto  and Jardiance  3 days pre-op. Will address when pt admitted for surgery.  All questions answered at this time.  Isaiah Knoll RN VAD Coordinator  Office: [phone]  24/7 Pager: [phone]

## 2023-12-29 NOTE — Addendum Note (Signed)
 Encounter addended by: Berdine Isaiah NOVAK, RN on: 12/29/2023 3:19 PM  Actions taken: Clinical Note Signed

## 2023-12-29 NOTE — Telephone Encounter (Signed)
 H&V Care Navigation CSW Progress Note  Clinical Social Worker called pt to discuss concerns with cost of dental appt on Monday.  Pt states he got a form from the dental office asking how he was going to provide payment and he is worried as he cannot afford.  Also asked for his insurance information and he does not have a card and doesn't think it provides dental.  Explained that Medicaid does have a dental benefit and would cover extractions.  Dental office closed today so will call on Monday morning before appt to provide insurance information and confirm coverage.  Andriette HILARIO Leech, LCSW Clinical Social Worker Advanced Heart Failure Clinic Desk#: [phone] Cell#: [phone]

## 2024-01-01 ENCOUNTER — Other Ambulatory Visit (HOSPITAL_COMMUNITY): Payer: Self-pay | Admitting: Unknown Physician Specialty

## 2024-01-01 ENCOUNTER — Telehealth (HOSPITAL_COMMUNITY): Payer: Self-pay | Admitting: Unknown Physician Specialty

## 2024-01-01 ENCOUNTER — Telehealth (HOSPITAL_COMMUNITY): Payer: Self-pay | Admitting: Licensed Clinical Social Worker

## 2024-01-01 DIAGNOSIS — I5043 Acute on chronic combined systolic (congestive) and diastolic (congestive) heart failure: Secondary | ICD-10-CM

## 2024-01-01 DIAGNOSIS — I5022 Chronic systolic (congestive) heart failure: Secondary | ICD-10-CM

## 2024-01-01 NOTE — Telephone Encounter (Signed)
 H&V Care Navigation CSW Progress Note  Clinical Social Worker spoke with Dr. Beverlyn office this morning and provided with Medicaid information.  They confirm that visit will be covered by Medicaid and no payment will be expected from pt today.  Patient informed of above  Andriette HILARIO Leech, LCSW Clinical Social Worker Advanced Heart Failure Clinic Desk#: [phone] Cell#: [phone]

## 2024-01-01 NOTE — Telephone Encounter (Signed)
 Called pt to inform him of VAD implant schedule and instructions. Pt instructed the following: Stop Eliquis  now Arrive at admitting on Friday 10/24 @ 1000am for RHC with DR Bensimhon at 1200. We will admit you after this procedure. Please do not eat or drink anything after midnight on Thursday 10/23. You will have 4 of your teeth extracted on Monday 10/27 You will have your VAD implanted on Thursday 10/30.  Pt was instructed to call the VAD office with any questions.  Lauraine Ip RN, BSN VAD Coordinator 24/7 Pager [phone]

## 2024-01-05 ENCOUNTER — Encounter (HOSPITAL_COMMUNITY): Admission: AD | Disposition: A | Discharge status: Home / Self Care | Payer: Self-pay | Source: Home / Self Care

## 2024-01-05 ENCOUNTER — Inpatient Hospital Stay (HOSPITAL_COMMUNITY): Admitting: Vascular Surgery

## 2024-01-05 ENCOUNTER — Inpatient Hospital Stay (HOSPITAL_COMMUNITY)
Admission: AD | Admit: 2024-01-05 | Discharge: 2024-01-24 | DRG: 001 | Disposition: A | Discharge status: Home / Self Care | Attending: Cardiology | Admitting: Cardiology

## 2024-01-05 ENCOUNTER — Other Ambulatory Visit: Payer: Self-pay

## 2024-01-05 ENCOUNTER — Encounter (HOSPITAL_COMMUNITY): Payer: Self-pay | Admitting: Oral Surgery

## 2024-01-05 DIAGNOSIS — I5082 Biventricular heart failure: Secondary | ICD-10-CM | POA: Diagnosis present

## 2024-01-05 DIAGNOSIS — K029 Dental caries, unspecified: Secondary | ICD-10-CM | POA: Diagnosis present

## 2024-01-05 DIAGNOSIS — I340 Nonrheumatic mitral (valve) insufficiency: Secondary | ICD-10-CM | POA: Diagnosis not present

## 2024-01-05 DIAGNOSIS — Z95811 Presence of heart assist device: Secondary | ICD-10-CM | POA: Diagnosis not present

## 2024-01-05 DIAGNOSIS — Y95 Nosocomial condition: Secondary | ICD-10-CM | POA: Diagnosis not present

## 2024-01-05 DIAGNOSIS — I509 Heart failure, unspecified: Secondary | ICD-10-CM | POA: Diagnosis present

## 2024-01-05 DIAGNOSIS — Z515 Encounter for palliative care: Secondary | ICD-10-CM | POA: Diagnosis not present

## 2024-01-05 DIAGNOSIS — R5381 Other malaise: Secondary | ICD-10-CM

## 2024-01-05 DIAGNOSIS — Q2112 Patent foramen ovale: Secondary | ICD-10-CM

## 2024-01-05 DIAGNOSIS — D6959 Other secondary thrombocytopenia: Secondary | ICD-10-CM | POA: Diagnosis not present

## 2024-01-05 DIAGNOSIS — E739 Lactose intolerance, unspecified: Secondary | ICD-10-CM | POA: Diagnosis present

## 2024-01-05 DIAGNOSIS — I130 Hypertensive heart and chronic kidney disease with heart failure and stage 1 through stage 4 chronic kidney disease, or unspecified chronic kidney disease: Secondary | ICD-10-CM | POA: Diagnosis present

## 2024-01-05 DIAGNOSIS — Z7189 Other specified counseling: Secondary | ICD-10-CM | POA: Diagnosis not present

## 2024-01-05 DIAGNOSIS — Z888 Allergy status to other drugs, medicaments and biological substances status: Secondary | ICD-10-CM

## 2024-01-05 DIAGNOSIS — Z87891 Personal history of nicotine dependence: Secondary | ICD-10-CM | POA: Diagnosis not present

## 2024-01-05 DIAGNOSIS — E876 Hypokalemia: Secondary | ICD-10-CM | POA: Diagnosis not present

## 2024-01-05 DIAGNOSIS — I255 Ischemic cardiomyopathy: Secondary | ICD-10-CM | POA: Diagnosis not present

## 2024-01-05 DIAGNOSIS — Z6841 Body Mass Index (BMI) 40.0 and over, adult: Secondary | ICD-10-CM

## 2024-01-05 DIAGNOSIS — E785 Hyperlipidemia, unspecified: Secondary | ICD-10-CM | POA: Diagnosis present

## 2024-01-05 DIAGNOSIS — E878 Other disorders of electrolyte and fluid balance, not elsewhere classified: Secondary | ICD-10-CM | POA: Diagnosis present

## 2024-01-05 DIAGNOSIS — R739 Hyperglycemia, unspecified: Secondary | ICD-10-CM | POA: Diagnosis not present

## 2024-01-05 DIAGNOSIS — J45909 Unspecified asthma, uncomplicated: Secondary | ICD-10-CM | POA: Diagnosis present

## 2024-01-05 DIAGNOSIS — I2510 Atherosclerotic heart disease of native coronary artery without angina pectoris: Secondary | ICD-10-CM | POA: Diagnosis not present

## 2024-01-05 DIAGNOSIS — I493 Ventricular premature depolarization: Secondary | ICD-10-CM | POA: Diagnosis present

## 2024-01-05 DIAGNOSIS — D62 Acute posthemorrhagic anemia: Secondary | ICD-10-CM | POA: Diagnosis not present

## 2024-01-05 DIAGNOSIS — R570 Cardiogenic shock: Secondary | ICD-10-CM | POA: Diagnosis not present

## 2024-01-05 DIAGNOSIS — I5020 Unspecified systolic (congestive) heart failure: Secondary | ICD-10-CM | POA: Diagnosis not present

## 2024-01-05 DIAGNOSIS — I272 Pulmonary hypertension, unspecified: Secondary | ICD-10-CM | POA: Diagnosis present

## 2024-01-05 DIAGNOSIS — Z555 Less than a high school diploma: Secondary | ICD-10-CM

## 2024-01-05 DIAGNOSIS — Z5982 Transportation insecurity: Secondary | ICD-10-CM

## 2024-01-05 DIAGNOSIS — J9601 Acute respiratory failure with hypoxia: Secondary | ICD-10-CM

## 2024-01-05 DIAGNOSIS — J189 Pneumonia, unspecified organism: Secondary | ICD-10-CM | POA: Diagnosis not present

## 2024-01-05 DIAGNOSIS — J9811 Atelectasis: Secondary | ICD-10-CM | POA: Diagnosis not present

## 2024-01-05 DIAGNOSIS — I5084 End stage heart failure: Secondary | ICD-10-CM | POA: Diagnosis present

## 2024-01-05 DIAGNOSIS — N183 Chronic kidney disease, stage 3 unspecified: Secondary | ICD-10-CM | POA: Diagnosis present

## 2024-01-05 DIAGNOSIS — R630 Anorexia: Secondary | ICD-10-CM

## 2024-01-05 DIAGNOSIS — G8918 Other acute postprocedural pain: Secondary | ICD-10-CM | POA: Diagnosis not present

## 2024-01-05 DIAGNOSIS — Z7984 Long term (current) use of oral hypoglycemic drugs: Secondary | ICD-10-CM

## 2024-01-05 DIAGNOSIS — Z79899 Other long term (current) drug therapy: Secondary | ICD-10-CM

## 2024-01-05 DIAGNOSIS — E871 Hypo-osmolality and hyponatremia: Secondary | ICD-10-CM

## 2024-01-05 DIAGNOSIS — R008 Other abnormalities of heart beat: Secondary | ICD-10-CM | POA: Diagnosis present

## 2024-01-05 DIAGNOSIS — I10 Essential (primary) hypertension: Secondary | ICD-10-CM | POA: Diagnosis not present

## 2024-01-05 DIAGNOSIS — I5043 Acute on chronic combined systolic (congestive) and diastolic (congestive) heart failure: Principal | ICD-10-CM | POA: Diagnosis present

## 2024-01-05 DIAGNOSIS — I428 Other cardiomyopathies: Secondary | ICD-10-CM | POA: Diagnosis present

## 2024-01-05 DIAGNOSIS — M109 Gout, unspecified: Secondary | ICD-10-CM | POA: Diagnosis present

## 2024-01-05 DIAGNOSIS — Z7901 Long term (current) use of anticoagulants: Secondary | ICD-10-CM | POA: Diagnosis not present

## 2024-01-05 DIAGNOSIS — D72829 Elevated white blood cell count, unspecified: Secondary | ICD-10-CM

## 2024-01-05 DIAGNOSIS — E1165 Type 2 diabetes mellitus with hyperglycemia: Secondary | ICD-10-CM | POA: Diagnosis present

## 2024-01-05 DIAGNOSIS — I5023 Acute on chronic systolic (congestive) heart failure: Secondary | ICD-10-CM | POA: Diagnosis not present

## 2024-01-05 DIAGNOSIS — J9600 Acute respiratory failure, unspecified whether with hypoxia or hypercapnia: Secondary | ICD-10-CM | POA: Diagnosis not present

## 2024-01-05 DIAGNOSIS — I472 Ventricular tachycardia, unspecified: Secondary | ICD-10-CM | POA: Diagnosis present

## 2024-01-05 DIAGNOSIS — E1122 Type 2 diabetes mellitus with diabetic chronic kidney disease: Secondary | ICD-10-CM | POA: Diagnosis present

## 2024-01-05 DIAGNOSIS — I5022 Chronic systolic (congestive) heart failure: Principal | ICD-10-CM | POA: Diagnosis present

## 2024-01-05 DIAGNOSIS — K761 Chronic passive congestion of liver: Secondary | ICD-10-CM | POA: Diagnosis present

## 2024-01-05 DIAGNOSIS — N182 Chronic kidney disease, stage 2 (mild): Secondary | ICD-10-CM | POA: Diagnosis not present

## 2024-01-05 DIAGNOSIS — Z59868 Other specified financial insecurity: Secondary | ICD-10-CM

## 2024-01-05 DIAGNOSIS — E440 Moderate protein-calorie malnutrition: Secondary | ICD-10-CM

## 2024-01-05 DIAGNOSIS — I252 Old myocardial infarction: Secondary | ICD-10-CM

## 2024-01-05 DIAGNOSIS — R140 Abdominal distension (gaseous): Secondary | ICD-10-CM | POA: Diagnosis not present

## 2024-01-05 DIAGNOSIS — D75839 Thrombocytosis, unspecified: Secondary | ICD-10-CM | POA: Diagnosis not present

## 2024-01-05 HISTORY — PX: RIGHT HEART CATH: CATH118263

## 2024-01-05 LAB — CBC WITH DIFFERENTIAL/PLATELET
Abs Immature Granulocytes: 0.01 K/uL (ref 0.00–0.07)
Basophils Absolute: 0 K/uL (ref 0.0–0.1)
Basophils Relative: 1 %
Eosinophils Absolute: 0.2 K/uL (ref 0.0–0.5)
Eosinophils Relative: 3 %
HCT: 43.6 % (ref 39.0–52.0)
Hemoglobin: 13.7 g/dL (ref 13.0–17.0)
Immature Granulocytes: 0 %
Lymphocytes Relative: 42 %
Lymphs Abs: 3.2 K/uL (ref 0.7–4.0)
MCH: 25.2 pg — ABNORMAL LOW (ref 26.0–34.0)
MCHC: 31.4 g/dL (ref 30.0–36.0)
MCV: 80.3 fL (ref 80.0–100.0)
Monocytes Absolute: 1 K/uL (ref 0.1–1.0)
Monocytes Relative: 13 %
Neutro Abs: 3.1 K/uL (ref 1.7–7.7)
Neutrophils Relative %: 41 %
Platelets: 165 K/uL (ref 150–400)
RBC: 5.43 MIL/uL (ref 4.22–5.81)
RDW: 20.6 % — ABNORMAL HIGH (ref 11.5–15.5)
WBC: 7.6 K/uL (ref 4.0–10.5)
nRBC: 0 % (ref 0.0–0.2)

## 2024-01-05 LAB — POCT I-STAT EG7
Acid-base deficit: 1 mmol/L (ref 0.0–2.0)
Acid-base deficit: 3 mmol/L — ABNORMAL HIGH (ref 0.0–2.0)
Bicarbonate: 21.9 mmol/L (ref 20.0–28.0)
Bicarbonate: 24.5 mmol/L (ref 20.0–28.0)
Calcium, Ion: 0.92 mmol/L — ABNORMAL LOW (ref 1.15–1.40)
Calcium, Ion: 1.19 mmol/L (ref 1.15–1.40)
HCT: 39 % (ref 39.0–52.0)
HCT: 44 % (ref 39.0–52.0)
Hemoglobin: 13.3 g/dL (ref 13.0–17.0)
Hemoglobin: 15 g/dL (ref 13.0–17.0)
O2 Saturation: 58 %
O2 Saturation: 66 %
Potassium: 2.7 mmol/L — CL (ref 3.5–5.1)
Potassium: 3.3 mmol/L — ABNORMAL LOW (ref 3.5–5.1)
Sodium: 141 mmol/L (ref 135–145)
Sodium: 145 mmol/L (ref 135–145)
TCO2: 23 mmol/L (ref 22–32)
TCO2: 26 mmol/L (ref 22–32)
pCO2, Ven: 38.4 mmHg — ABNORMAL LOW (ref 44–60)
pCO2, Ven: 43.2 mmHg — ABNORMAL LOW (ref 44–60)
pH, Ven: 7.363 (ref 7.25–7.43)
pH, Ven: 7.363 (ref 7.25–7.43)
pO2, Ven: 31 mmHg — CL (ref 32–45)
pO2, Ven: 35 mmHg (ref 32–45)

## 2024-01-05 LAB — COMPREHENSIVE METABOLIC PANEL WITH GFR
ALT: 30 U/L (ref 0–44)
AST: 25 U/L (ref 15–41)
Albumin: 3.3 g/dL — ABNORMAL LOW (ref 3.5–5.0)
Alkaline Phosphatase: 62 U/L (ref 38–126)
Anion gap: 9 (ref 5–15)
BUN: 18 mg/dL (ref 6–20)
CO2: 24 mmol/L (ref 22–32)
Calcium: 8.8 mg/dL — ABNORMAL LOW (ref 8.9–10.3)
Chloride: 106 mmol/L (ref 98–111)
Creatinine, Ser: 1.23 mg/dL (ref 0.61–1.24)
GFR, Estimated: >60 mL/min (ref >60)
Glucose, Bld: 96 mg/dL (ref 70–99)
Potassium: 3.4 mmol/L — ABNORMAL LOW (ref 3.5–5.1)
Sodium: 139 mmol/L (ref 135–145)
Total Bilirubin: 0.9 mg/dL (ref 0.0–1.2)
Total Protein: 6.1 g/dL — ABNORMAL LOW (ref 6.5–8.1)

## 2024-01-05 LAB — CBC
HCT: 45.6 % (ref 39.0–52.0)
Hemoglobin: 15.1 g/dL (ref 13.0–17.0)
MCH: 26 pg (ref 26.0–34.0)
MCHC: 33.1 g/dL (ref 30.0–36.0)
MCV: 78.6 fL — ABNORMAL LOW (ref 80.0–100.0)
Platelets: 186 K/uL (ref 150–400)
RBC: 5.8 MIL/uL (ref 4.22–5.81)
RDW: 20.3 % — ABNORMAL HIGH (ref 11.5–15.5)
WBC: 7.1 K/uL (ref 4.0–10.5)
nRBC: 0 % (ref 0.0–0.2)

## 2024-01-05 LAB — MAGNESIUM: Magnesium: 1.9 mg/dL (ref 1.7–2.4)

## 2024-01-05 LAB — MRSA NEXT GEN BY PCR, NASAL: MRSA by PCR Next Gen: NOT DETECTED

## 2024-01-05 LAB — BRAIN NATRIURETIC PEPTIDE: B Natriuretic Peptide: 674.8 pg/mL — ABNORMAL HIGH (ref 0.0–100.0)

## 2024-01-05 MED ORDER — TORSEMIDE 20 MG PO TABS: 40.0000 mg | ORAL_TABLET | Freq: Every day | ORAL | Status: DC

## 2024-01-05 MED ORDER — SODIUM CHLORIDE 0.9 % IV SOLN: 250.0000 mL | INTRAVENOUS | Status: DC | PRN

## 2024-01-05 MED ORDER — LIDOCAINE HCL (PF) 1 % IJ SOLN
INTRAMUSCULAR | Status: DC | PRN
  Administered 2024-01-05: 2 mL

## 2024-01-05 MED ORDER — MAGNESIUM SULFATE 2 GM/50ML IV SOLN
2.0000 g | Freq: Once | INTRAVENOUS | Status: AC
  Administered 2024-01-05: 2 g via INTRAVENOUS
  Filled 2024-01-05: qty 50

## 2024-01-05 MED ORDER — POTASSIUM CHLORIDE IN NACL 40-0.9 MEQ/L-% IV SOLN
INTRAVENOUS | Status: AC
  Filled 2024-01-05: qty 1000

## 2024-01-05 MED ORDER — ASPIRIN 81 MG PO CHEW: 81.0000 mg | CHEWABLE_TABLET | ORAL | Status: DC

## 2024-01-05 MED ORDER — ONDANSETRON HCL 4 MG/2ML IJ SOLN
4.0000 mg | Freq: Four times a day (QID) | INTRAMUSCULAR | Status: DC | PRN
  Administered 2024-01-08: 4 mg via INTRAVENOUS

## 2024-01-05 MED ORDER — SODIUM CHLORIDE 0.9% FLUSH: 3.0000 mL | Freq: Two times a day (BID) | INTRAVENOUS | Status: DC

## 2024-01-05 MED ORDER — HYDROCODONE-ACETAMINOPHEN 5-325 MG PO TABS: 1.0000 | ORAL_TABLET | Freq: Once | ORAL | Status: DC

## 2024-01-05 MED ORDER — ALBUTEROL SULFATE (2.5 MG/3ML) 0.083% IN NEBU
2.5000 mg | INHALATION_SOLUTION | Freq: Four times a day (QID) | RESPIRATORY_TRACT | Status: DC | PRN
  Administered 2024-01-07: 2.5 mg via RESPIRATORY_TRACT
  Filled 2024-01-05: qty 3

## 2024-01-05 MED ORDER — ALLOPURINOL 100 MG PO TABS
100.0000 mg | ORAL_TABLET | Freq: Every day | ORAL | Status: DC
  Administered 2024-01-06 – 2024-01-10 (×5): 100 mg via ORAL
  Filled 2024-01-05 (×5): qty 1

## 2024-01-05 MED ORDER — SODIUM CHLORIDE 0.9 % IV SOLN: 250.0000 mL | INTRAVENOUS | Status: AC | PRN

## 2024-01-05 MED ORDER — FENTANYL CITRATE (PF) 100 MCG/2ML IJ SOLN
INTRAMUSCULAR | Status: DC | PRN
  Administered 2024-01-05 (×2): 25 ug
  Administered 2024-01-05 (×2): 25 ug via INTRAVENOUS

## 2024-01-05 MED ORDER — SODIUM CHLORIDE 0.9% FLUSH
3.0000 mL | Freq: Two times a day (BID) | INTRAVENOUS | Status: DC
  Administered 2024-01-05 – 2024-01-10 (×7): 3 mL via INTRAVENOUS

## 2024-01-05 MED ORDER — MIDAZOLAM HCL (PF) 2 MG/2ML IJ SOLN
INTRAMUSCULAR | Status: DC | PRN
  Administered 2024-01-05: 1 mg via INTRAVENOUS

## 2024-01-05 MED ORDER — SODIUM CHLORIDE 0.9% FLUSH: 3.0000 mL | INTRAVENOUS | Status: DC | PRN

## 2024-01-05 MED ORDER — DIGOXIN 125 MCG PO TABS
0.1250 mg | ORAL_TABLET | Freq: Every day | ORAL | Status: DC
Start: 2024-01-06 — End: 2024-01-11
  Administered 2024-01-06 – 2024-01-10 (×5): 0.125 mg via ORAL
  Filled 2024-01-05 (×5): qty 1

## 2024-01-05 MED ORDER — FENTANYL CITRATE (PF) 100 MCG/2ML IJ SOLN
INTRAMUSCULAR | Status: AC
  Filled 2024-01-05: qty 2

## 2024-01-05 MED ORDER — COLCHICINE 0.6 MG PO TABS
0.6000 mg | ORAL_TABLET | Freq: Two times a day (BID) | ORAL | Status: DC
Start: 2024-01-05 — End: 2024-01-11
  Administered 2024-01-05 – 2024-01-10 (×10): 0.6 mg via ORAL
  Filled 2024-01-05 (×10): qty 1

## 2024-01-05 MED ORDER — SODIUM CHLORIDE 0.9% FLUSH
3.0000 mL | INTRAVENOUS | Status: AC | PRN
Start: 2024-01-05 — End: ?

## 2024-01-05 MED ORDER — HEPARIN (PORCINE) 25000 UT/250ML-% IV SOLN
1850.0000 [IU]/h | INTRAVENOUS | Status: DC
  Administered 2024-01-05: 1600 [IU]/h via INTRAVENOUS
  Administered 2024-01-06: 2000 [IU]/h via INTRAVENOUS
  Administered 2024-01-07 (×2): 1850 [IU]/h via INTRAVENOUS
  Filled 2024-01-05 (×4): qty 250

## 2024-01-05 MED ORDER — EZETIMIBE 10 MG PO TABS
10.0000 mg | ORAL_TABLET | Freq: Every day | ORAL | Status: DC
Start: 2024-01-05 — End: 2024-01-11
  Administered 2024-01-05 – 2024-01-10 (×6): 10 mg via ORAL
  Filled 2024-01-05 (×6): qty 1

## 2024-01-05 MED ORDER — SPIRONOLACTONE 25 MG PO TABS
25.0000 mg | ORAL_TABLET | Freq: Every day | ORAL | Status: DC
Start: 2024-01-06 — End: 2024-01-09
  Administered 2024-01-06 – 2024-01-09 (×4): 25 mg via ORAL
  Filled 2024-01-05 (×4): qty 1

## 2024-01-05 MED ORDER — ACETAMINOPHEN 325 MG PO TABS
650.0000 mg | ORAL_TABLET | ORAL | Status: DC | PRN
  Administered 2024-01-05 – 2024-01-06 (×2): 650 mg via ORAL
  Filled 2024-01-05 (×2): qty 2

## 2024-01-05 MED ORDER — MEXILETINE HCL 150 MG PO CAPS
300.0000 mg | ORAL_CAPSULE | Freq: Two times a day (BID) | ORAL | Status: DC
Start: 2024-01-05 — End: 2024-01-11
  Administered 2024-01-05 – 2024-01-10 (×11): 300 mg via ORAL
  Filled 2024-01-05 (×12): qty 2

## 2024-01-05 MED ORDER — MIDAZOLAM HCL 2 MG/2ML IJ SOLN
INTRAMUSCULAR | Status: AC
  Filled 2024-01-05: qty 2

## 2024-01-05 MED ORDER — HEPARIN (PORCINE) IN NACL 1000-0.9 UT/500ML-% IV SOLN
INTRAVENOUS | Status: DC | PRN
  Administered 2024-01-05: 500 mL

## 2024-01-05 NOTE — Plan of Care (Signed)
  Problem: Education: Goal: Understanding of CV disease, CV risk reduction, and recovery process will improve Outcome: Progressing Goal: Individualized Educational Video(s) Outcome: Progressing   Problem: Activity: Goal: Ability to return to baseline activity level will improve Outcome: Progressing   Problem: Cardiovascular: Goal: Ability to achieve and maintain adequate cardiovascular perfusion will improve Outcome: Progressing Goal: Vascular access site(s) Level 0-1 will be maintained Outcome: Progressing   Problem: Health Behavior/Discharge Planning: Goal: Ability to safely manage health-related needs after discharge will improve Outcome: Progressing   Problem: Education: Goal: Knowledge of General Education information will improve Description: Including pain rating scale, medication(s)/side effects and non-pharmacologic comfort measures Outcome: Progressing   Problem: Health Behavior/Discharge Planning: Goal: Ability to manage health-related needs will improve Outcome: Progressing

## 2024-01-05 NOTE — Anesthesia Preprocedure Evaluation (Addendum)
 Anesthesia Evaluation  Patient identified by MRN, date of birth, ID band Patient awake    Reviewed: Allergy & Precautions, NPO status , Patient's Chart, lab work & pertinent test results, reviewed documented beta blocker date and time   History of Anesthesia Complications Negative for: history of anesthetic complications  Airway Mallampati: II  TM Distance: >3 FB     Dental  (+) Missing, Loose   Pulmonary shortness of breath and with exertion, asthma , former smoker   breath sounds clear to auscultation       Cardiovascular Exercise Tolerance: Poor hypertension, + CAD, + Past MI and +CHF  + dysrhythmias  Rhythm:Regular Rate:Normal  IMPRESSIONS     1. Left ventricular ejection fraction, by estimation, is <20%. The left  ventricle has severely decreased function. The left ventricle demonstrates  global hypokinesis. The left ventricular internal cavity size was severely  dilated. Indeterminate  diastolic filling due to E-A fusion.   2. Right ventricular systolic function is low normal. The right  ventricular size is moderately enlarged.   3. Left atrial size was severely dilated.   4. Right atrial size was moderately dilated.   5. The mitral valve is normal in structure. Moderate mitral valve  regurgitation. No evidence of mitral stenosis.   6. The aortic valve is grossly normal. Aortic valve regurgitation is not  visualized. No aortic stenosis is present.   7. The inferior vena cava is dilated in size with <50% respiratory  variability, suggesting right atrial pressure of 15 mmHg.   Findings:   RA = 2 RV = 22/7 PA = 29/18 (21) PCW = 5 Fick cardiac output/index = 5.1/1.9 Thermo CO/CI = 5.9/2.2 PVR = 3.1 (Fick) TD (2.7) Ao sat = 96% PA sat = 58*, 66% PAPi = 5.5   Assessment: 1. Low filling pressures with moderately reduce CO        Neuro/Psych neg Seizures    GI/Hepatic ,,,(+) neg Cirrhosis         Endo/Other  diabetes    Renal/GU CRFRenal disease     Musculoskeletal   Abdominal   Peds  Hematology   Anesthesia Other Findings   Reproductive/Obstetrics                              Anesthesia Physical Anesthesia Plan  ASA: 4  Anesthesia Plan: General   Post-op Pain Management:    Induction: Intravenous  PONV Risk Score and Plan: 2 and Ondansetron  and Dexamethasone  Airway Management Planned: Nasal ETT and Video Laryngoscope Planned  Additional Equipment: Arterial line  Intra-op Plan:   Post-operative Plan: Extubation in OR  Informed Consent: I have reviewed the patients History and Physical, chart, labs and discussed the procedure including the risks, benefits and alternatives for the proposed anesthesia with the patient or authorized representative who has indicated his/her understanding and acceptance.     Dental advisory given  Plan Discussed with: CRNA  Anesthesia Plan Comments: (Dental extractions on 01/08/2024 in preparation for LVAD on 01/11/2024. )         Anesthesia Quick Evaluation

## 2024-01-05 NOTE — H&P (Addendum)
 Advanced Heart Failure Team History and Physical Note   PCP:  Pcp, No  HF Cardiologist: Dr. Cherrie  Reason for Admission: HFrEF, preVAD optimization   HPI:    Philip Monroe is a 48 y.o.. Obese male with multivessel CAD, chronic HFrEF, inoncompaction CM, HTN,  previous ETOH use, noncompliance.    He was followed in the past by [facility] cardiology for iCM/noncompaction CM. RHC 5/24: Normal LV and RV filling pressures. Mild pulmonary hypertension with mild elevation of PVL. Low cardiac index 1.8. LHC 5/24: Severe multivessel CAD including 80% mid RCA, 100% RPDA, 100% mid LCx, 80% OM1, 80% mid-distal LAD. Medically managed.    Admitted 05/19/23 with progressive HF symptoms after being noncompliant with meds. Echo EF <20%, LV with GHK, LV severely dilated, RV mod reduced, LA severely dilated, mild-mod MR. Diuresed well with IV lasix . GDMT restarted. D/c weight 360 pounds.      Zio monitor (5/25) with 25.3% PVCs and frequent NSVT. Mexiletine started.    He was admitted 7/25 with CHF.    He was admitted in 9/25 with CHF exacerbation. Echo showed EF < 20%, low normal RV function, moderate MR.  LHC/RHC showed severely elevated filling pressures, low CO by Fick and thermodilution, severe diffuse CAD with occluded proximal PDA and mid AV LCx, severe diffuse disease in the PLV and 90% proximal ramus stenosis with nonobstructive LAD disease.  Cardiac MRI showed severely dilated LV with LV EF 14%, RV EF 29%, delayed enhancement showed evidence for prior MI in the mid inferior and mid inferolateral walls, also mid-wall LGE in the basal septum. Patient was started on milrinone  and diuresed, milrinone  was titrated off.    He is being admitted today for HF optimization pre dental extraction 10/27 and HM III VAD implantation on 01/11/24.   RHC today with low filling pressures and reduced CI, 1.9 TD, 2.2 Fick. Swan left in for hemodynamic monitoring.   Home Medications Prior to Admission medications    Medication Sig Start Date End Date Taking? Authorizing Provider  allopurinol  (ZYLOPRIM ) 100 MG tablet Take 1 tablet (100 mg total) by mouth daily. 12/21/23 03/20/24 Yes Su, Con RAMAN, MD  colchicine  0.6 MG tablet Take 1 tablet (0.6 mg total) by mouth 2 (two) times daily. 12/08/23  Yes Rolan Ezra RAMAN, MD  digoxin  (LANOXIN ) 0.125 MG tablet Take 1 tablet (0.125 mg total) by mouth daily. 12/05/23  Yes Fausto Sor A, DO  empagliflozin  (JARDIANCE ) 10 MG TABS tablet Take 1 tablet (10 mg total) by mouth daily. 12/04/23  Yes Fausto Sor A, DO  ezetimibe  (ZETIA ) 10 MG tablet Take 1 tablet (10 mg total) by mouth at bedtime. 12/04/23 03/03/24 Yes Fausto Sor A, DO  mexiletine (MEXITIL ) 150 MG capsule Take 2 capsules (300 mg total) by mouth 2 (two) times daily. 12/04/23  Yes Fausto Sor A, DO  sacubitril -valsartan  (ENTRESTO ) 24-26 MG Take 1 tablet by mouth 2 (two) times daily. 12/25/23  Yes Detta Mellin, Toribio SAUNDERS, MD  spironolactone  (ALDACTONE ) 25 MG tablet Take 1 tablet (25 mg total) by mouth daily. 12/04/23  Yes Fausto Sor A, DO  torsemide  (DEMADEX ) 20 MG tablet Take 2 tablets (40 mg total) by mouth daily. 12/04/23  Yes Fausto Sor LABOR, DO  albuterol  (VENTOLIN  HFA) 108 (90 Base) MCG/ACT inhaler Inhale 1-2 puffs into the lungs every 6 (six) hours as needed for wheezing or shortness of breath.    [provider]  ELIQUIS  5 MG TABS tablet Take 1 tablet (5 mg total) by mouth  2 (two) times daily. Patient not taking: Reported on 01/04/2024 12/04/23   Fausto Burnard LABOR, DO    Past Medical History: Past Medical History:  Diagnosis Date   Aortic root dilatation    a. 07/2022 Echo: Ao root 4.4cm, Asc Ao 4.0cm.   Asthma    as a child, no problems as an adult, rarely uses inhaler   CAD (coronary artery disease)    a. 08/2017 Cath: LM nl, LAD 77m/d, D1 70ost, LCX 119m, OM1 90 inf branch, 60 sup branch, OM2 small, RCA 30 diffuse, RPDA 100 w/ L->R collats; b. 07/2022 Echo: LM nl, LAD 67m, D1 80, LCX  171m, OM1 80, RCA 15m, RPDA 100-->No targets for PCI/CABG-->Med rx.   CHF (congestive heart failure) (HCC)    takes lasix    Chronic HFrEF (heart failure with reduced ejection fraction) (HCC)    a. 03/2015 Echo: EF 35-40%; b. 08/2017 Echo: EF 15%; c. 08/2017 cMRI: EF 35%; d. 06/2018 Echo: EF 10-15%; e. 07/2022 Echo: EF 15-20%, grIII DD, mild MR, sev dil LA, mod-sev PH, Ao root 4.4cm, Asc Ao 4.0cm; f. 07/2022 RHC: RA 4, RV 38/3, PA 43/18 (26), PCWP 11. CO/CI 5.3/1.8.   CKD (chronic kidney disease), stage II    Diabetes mellitus without complication (HCC)    type 2   Dyspnea    with exertion   Gout    Hyperlipidemia LDL goal <70    Hypertension    Ischemic cardiomyopathy    a. 03/2015 Echo: EF 35-40%; b. 08/2017 Echo: EF 15%; c. 08/2017 cMRI: EF 35%; d. 06/2018 Echo: EF 10-15%; e. 07/2022 Echo: EF 15-20%, grIII DD.   IVCD (intraventricular conduction defect)    Left ventricular noncompaction [facility])    Morbid obesity (HCC)    NSVT (nonsustained ventricular tachycardia) (HCC)    a. 09/2022 Event monitor: predominantly sinus rhythm, avg HR 81 (46-167). 17 runs NSVT (fastest/longest 13 beats x 167). Rare PACs, 17.5% PVC burden.   PVC's (premature ventricular contractions)    a. 09/2022 Event Monitor: 17.5% PVC burden.   Statin intolerance    Tobacco abuse     Past Surgical History: Past Surgical History:  Procedure Laterality Date   RIGHT/LEFT HEART CATH AND CORONARY ANGIOGRAPHY N/A 11/28/2023   Procedure: RIGHT/LEFT HEART CATH AND CORONARY ANGIOGRAPHY;  Surgeon: Rolan Ezra RAMAN, MD;  Location: [facility] INVASIVE CV LAB;  Service: Cardiovascular;  Laterality: N/A;    Family History:  No family history on file.  Social History: Social History   Socioeconomic History   Marital status: Single    Spouse name: Not on file   Number of children: 4   Years of education: Not on file   Highest education level: 10th grade  Occupational History   Not on file  Tobacco Use   Smoking status: Former     Current packs/day: 0.00    Types: Cigarettes    Quit date: 03/20/2015    Years since quitting: 8.8   Smokeless tobacco: Never   Tobacco comments:    Stopped smoking 11/24/2023  Vaping Use   Vaping status: Never Used  Substance and Sexual Activity   Alcohol use: Not Currently    Alcohol/week: 36.0 standard drinks of alcohol    Types: 36 Cans of beer per week   Drug use: No   Sexual activity: Yes    Birth control/protection: Coitus interruptus, Condom  Other Topics Concern   Not on file  Social History Narrative   Not on file   Social Drivers of  Health   Financial Resource Strain: Medium Risk (12/13/2023)   Overall Financial Resource Strain (CARDIA)    Difficulty of Paying Living Expenses: Somewhat hard  Food Insecurity: No Food Insecurity (11/25/2023)   Hunger Vital Sign    Worried About Running Out of Food in the Last Year: Never true    Ran Out of Food in the Last Year: Never true  Transportation Needs: Unmet Transportation Needs (12/13/2023)   PRAPARE - Administrator, Civil Service (Medical): Yes    Lack of Transportation (Non-Medical): Yes  Physical Activity: Not on file  Stress: Not on file  Social Connections: Not on file    Allergies:  Allergies  Allergen Reactions   Lactose Intolerance (Gi) Diarrhea   Statins     myalgias    Objective:    Vital Signs:   Temp:  [97.8 F (36.6 C)] 97.8 F (36.6 C) (10/24 1028) Pulse Rate:  [72] 72 (10/24 1028) Resp:  [20] 20 (10/24 1028) BP: (104)/(79) 104/79 (10/24 1028) SpO2:  [98 %] 98 % (10/24 1028) Weight:  [146.5 kg-147 kg] 147 kg (10/24 1028)   Filed Weights   01/05/24 1028  Weight: (!) 147 kg     Physical Exam     General:  Fatigued appearing Cor: R internal jugular Swan. Regular rate & rhythm. No murmurs. Lungs: Breathing nonlabored Abdomen: Obese, soft, nontender, nondistended.  Extremities: No edema Neuro: Alert & oriented x 3. Affect pleasant.  Labs     Basic Metabolic Panel: No  results for input(s): NA, K, CL, CO2, GLUCOSE, BUN, CREATININE, CALCIUM, MG, PHOS in the last 168 hours.  Liver Function Tests: No results for input(s): AST, ALT, ALKPHOS, BILITOT, PROT, ALBUMIN in the last 168 hours. No results for input(s): LIPASE, AMYLASE in the last 168 hours. No results for input(s): AMMONIA in the last 168 hours.  CBC: Recent Labs  Lab 01/05/24 1103  WBC 7.1  HGB 15.1  HCT 45.6  MCV 78.6*  PLT 186    Cardiac Enzymes: No results for input(s): CKTOTAL, CKMB, CKMBINDEX, TROPONINI in the last 168 hours.  BNP: BNP (last 3 results) Recent Labs    09/25/23 0958 11/24/23 1618 12/25/23 1139  BNP 1,509.6* 1,700.4* 315.6*    ProBNP (last 3 results) No results for input(s): PROBNP in the last 8760 hours.   CBG: No results for input(s): GLUCAP in the last 168 hours.  Coagulation Studies: No results for input(s): LABPROT, INR in the last 72 hours.  Imaging: No results found.   Patient Profile   48 year old male with history of chronic HFrEF, CAD, PVCs, morbid obesity, tobacco use, possible prior LV thrombus, gout. Admitted for preVAD optimization.  Assessment/Plan   1. 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  PVCs may also contribute, 25.3% PVCs with NSVT on Zio monitor in 5/25 -Echo 9/25 with EF < 20%, severe LV dilation, moderate RV enlargement with low normal systolic function, moderate MR, dilated IVC.   Re-admitted in 9/25.  Had been out of most medications. RHC markedly elevated filling pressures with low CI (1.6 Fick/1.77 thermo). Cardiac MRI LV EF 14%, RV EF 29%, suspect mixed ischemic/nonischemic cardiomyopathy with scarring process due to MI in the mid inferior/inferolateral wall and non-coronary type mid-wall LGE in the basal septum.  He was started on milrinone  and diuresed, then milrinone  weaned off.   -  RHC 01/05/24 with low filling pressures and reduced CI (1.9 TD,  2.2 Fick). - NYHA class IV symptoms, - Volume low today. Can reassess need for diuretic tomorrow - Hold entresto  and jardiance  preop - Continue digoxin  - spironolactone  25 mg daily.  - Off Toprol  XL with low output.  - With weight and smoking (quit < 1 month), not a candidate for heart transplant.   - Plan for dental extractions 10/27 and VAD implant 10/30.    2. CAD:  LHC 5/24 at [facility] showed severe multivessel CAD including 80% mid RCA, 100% RPDA, 100% mid LCx, 80% OM1, 80% mid-distal LAD. Medical management recommended by [facility] cardiology 05/24.  Repeat coronary angiography in 9/25 showed occluded PDA and mid AV LCx, 90% ramus, severe diffuse disease in right PLV, nonobstructive LAD disease.  With markedly low EF and without severe LAD disease, CABG nor a durable option.  - No ASA given anticoagulation use and stable CAD.  - Continue Zetia , has not been able to tolerate statin.  Refer to lipid clinic for Repatha as outpatient. Lp(a) is very elevated.    3. PVCs: Zio 5/25 with 25.3%, NSVT noted burden.  He did not tolerate amiodarone  well and was started on mexiletine.  - Follow on tele - Continue mexilitene   - Will need sleep study.    4. Morbid obesity:  - Body mass index is 40 - consider GLP1RA in the future .  5. Tobacco use:  - Has quit < 55month .  6. Anticoagulation: Possible LV thrombus by echo in the past. No LV thrombus on cardiac MRI 9/25.  -  Hold eliquis , start heparin  gtt   7. Gout: Very symptomatic, knees and ankles.   - Continue allopurinol  and colchicine    FINCH, LINDSAY N, PA-C 01/05/2024, 12:30 PM  Total Time Spent 42 minutes  Advanced Heart Failure Team Pager [phone] (M-F; 7a - 5p)  Please contact [facility] for night-coverage after hours (4p -7a ) and weekends on [email or web address]  Patient seen and examined with the above-signed Advanced Practice Provider and/or Housestaff. I personally reviewed  laboratory data, imaging studies and relevant notes. I independently examined the patient and formulated the important aspects of the plan. I have edited the note to reflect any of my changes or salient points. I have personally discussed the plan with the patient and/or family.   48 y/o male as above with severe systolic HF due to mixed iCm/NICM with baseline NYHA IV symptoms. Presented to day for outpatient RHC prior to preadmit for LVAD placement   RHC 01/05/24 with low filling pressures and reduced CI (1.9 TD, 2.2 Fick).  General:  Sitting up  No resp difficulty HEENT: normal Neck: supple. no JVD. Carotids 2+ bilat; no bruits. No lymphadenopathy or thryomegaly appreciated. Cor: PMI nondisplaced. Regular rate & rhythm. No rubs, gallops or murmurs. Lungs: clear Abdomen: obese soft, nontender, nondistended. No hepatosplenomegaly. No bruits or masses. Good bowel sounds. Extremities: no cyanosis, clubbing, rash, edema Neuro: alert & orientedx3, cranial nerves grossly intact. moves all 4 extremities w/o difficulty. Affect pleasant  Will leave swan in. Admit to 2H. Start inotropes as needed. Teeth extraction next week followed by VAD implant.  Toribio Fuel, MD  7:58 PM

## 2024-01-05 NOTE — Progress Notes (Signed)
 PHARMACY - ANTICOAGULATION CONSULT NOTE  Pharmacy Consult for heparin  Indication: LV thrombus  Allergies  Allergen Reactions   Lactose Intolerance (Gi) Diarrhea   Statins     myalgias    Patient Measurements: Height: 6' 3 (190.5 cm) Weight: (!) 147 kg (324 lb) IBW/kg (Calculated) : 84.5 HEPARIN  DW (KG): 118  Vital Signs: Temp: 97.8 F (36.6 C) (10/24 1028) BP: 110/90 (10/24 1408) Pulse Rate: 83 (10/24 1408)  Labs: Recent Labs    01/05/24 1103  HGB 15.1  HCT 45.6  PLT 186    Estimated Creatinine Clearance: 94.3 mL/min (A) (by C-G formula based on SCr of 1.5 mg/dL (H)).   Medical History: Past Medical History:  Diagnosis Date   Aortic root dilatation    a. 07/2022 Echo: Ao root 4.4cm, Asc Ao 4.0cm.   Asthma    as a child, no problems as an adult, rarely uses inhaler   CAD (coronary artery disease)    a. 08/2017 Cath: LM nl, LAD 91m/d, D1 70ost, LCX 146m, OM1 90 inf branch, 60 sup branch, OM2 small, RCA 30 diffuse, RPDA 100 w/ L->R collats; b. 07/2022 Echo: LM nl, LAD 44m, D1 80, LCX 151m, OM1 80, RCA 42m, RPDA 100-->No targets for PCI/CABG-->Med rx.   CHF (congestive heart failure) (HCC)    takes lasix    Chronic HFrEF (heart failure with reduced ejection fraction) (HCC)    a. 03/2015 Echo: EF 35-40%; b. 08/2017 Echo: EF 15%; c. 08/2017 cMRI: EF 35%; d. 06/2018 Echo: EF 10-15%; e. 07/2022 Echo: EF 15-20%, grIII DD, mild MR, sev dil LA, mod-sev PH, Ao root 4.4cm, Asc Ao 4.0cm; f. 07/2022 RHC: RA 4, RV 38/3, PA 43/18 (26), PCWP 11. CO/CI 5.3/1.8.   CKD (chronic kidney disease), stage II    Diabetes mellitus without complication (HCC)    type 2   Dyspnea    with exertion   Gout    Hyperlipidemia LDL goal <70    Hypertension    Ischemic cardiomyopathy    a. 03/2015 Echo: EF 35-40%; b. 08/2017 Echo: EF 15%; c. 08/2017 cMRI: EF 35%; d. 06/2018 Echo: EF 10-15%; e. 07/2022 Echo: EF 15-20%, grIII DD.   IVCD (intraventricular conduction defect)    Left ventricular noncompaction  [facility])    Morbid obesity (HCC)    NSVT (nonsustained ventricular tachycardia) (HCC)    a. 09/2022 Event monitor: predominantly sinus rhythm, avg HR 81 (46-167). 17 runs NSVT (fastest/longest 13 beats x 167). Rare PACs, 17.5% PVC burden.   PVC's (premature ventricular contractions)    a. 09/2022 Event Monitor: 17.5% PVC burden.   Statin intolerance    Tobacco abuse      Assessment: 89 yoM admitted for RHC. Pt on apixaban  PTA for hx LV thrombus, now to start IV heparin  post/cath. LD apixaban  10/22.  Goal of Therapy:  Heparin  level 0.3-0.7 units/ml aPTT 66-102 seconds Monitor platelets by anticoagulation protocol: Yes   Plan:  Heparin  1600 units/h no bolus Check 6h heparin  level and aPTT  Ozell Jamaica, PharmD, BCPS, [facility] Clinical Pharmacist [phone] Please check AMION for all [facility] Pharmacy numbers 01/05/2024

## 2024-01-05 NOTE — Interval H&P Note (Signed)
 History and Physical Interval Note:  01/05/2024 10:55 AM  Philip Monroe  has presented today for surgery, with the diagnosis of heart failure.  The various methods of treatment have been discussed with the patient and family. After consideration of risks, benefits and other options for treatment, the patient has consented to  Procedure(s): RIGHT HEART CATH (N/A) as a surgical intervention.  The patient's history has been reviewed, patient examined, no change in status, stable for surgery.  I have reviewed the patient's chart and labs.  Questions were answered to the patient's satisfaction.     Ramesha Poster

## 2024-01-05 NOTE — Progress Notes (Signed)
 PCP - None Cardiologist - Dr Toribio Fuel  Chest x-ray - 11/24/23 EKG - 12/08/23 Stress Test - 04/03/15 ECHO - 11/26/23 Cardiac Cath - 11/28/23 (cath scheduled today at noon)  ICD Pacemaker/Loop - n/a  Sleep Study -  n/a  Diabetes Type 2, does not check blood sugar.  Hold Jardiance  72 hours prior to procedure.  Last dose was on 01/04/24.  Eliquis  Instructions:  Last dose was on 01/01/24.  Aspirin  Instructions: n/a  NPO  Anesthesia review: Yes  STOP now taking any Aspirin  (unless otherwise instructed by your surgeon), Aleve , Naproxen , Ibuprofen, Motrin, Advil, Goody's, BC's, all herbal medications, fish oil, and all vitamins.   Coronavirus Screening Do you have any of the following symptoms:  Cough yes/no: No Fever (>100.73F)  yes/no: No Runny nose yes/no: No Sore throat yes/no: No Difficulty breathing/shortness of breath  yes/no: No  Have you traveled in the last 14 days and where? yes/no: No  Patient verbalized understanding of instructions that were given via phone.

## 2024-01-06 DIAGNOSIS — Z515 Encounter for palliative care: Secondary | ICD-10-CM

## 2024-01-06 DIAGNOSIS — Z7189 Other specified counseling: Secondary | ICD-10-CM | POA: Diagnosis not present

## 2024-01-06 DIAGNOSIS — I5043 Acute on chronic combined systolic (congestive) and diastolic (congestive) heart failure: Secondary | ICD-10-CM

## 2024-01-06 DIAGNOSIS — I5022 Chronic systolic (congestive) heart failure: Secondary | ICD-10-CM | POA: Diagnosis not present

## 2024-01-06 LAB — COOXEMETRY PANEL
Carboxyhemoglobin: 1.9 % — ABNORMAL HIGH (ref 0.5–1.5)
Methemoglobin: 1.3 % (ref 0.0–1.5)
O2 Saturation: 70.5 %
Total hemoglobin: 13.8 g/dL (ref 12.0–16.0)

## 2024-01-06 LAB — HEPARIN LEVEL (UNFRACTIONATED)
Heparin Unfractionated: 0.25 [IU]/mL — ABNORMAL LOW (ref 0.30–0.70)
Heparin Unfractionated: 0.45 [IU]/mL (ref 0.30–0.70)

## 2024-01-06 LAB — CBC
HCT: 43 % (ref 39.0–52.0)
Hemoglobin: 13.8 g/dL (ref 13.0–17.0)
MCH: 25.7 pg — ABNORMAL LOW (ref 26.0–34.0)
MCHC: 32.1 g/dL (ref 30.0–36.0)
MCV: 79.9 fL — ABNORMAL LOW (ref 80.0–100.0)
Platelets: 158 K/uL (ref 150–400)
RBC: 5.38 MIL/uL (ref 4.22–5.81)
RDW: 20.3 % — ABNORMAL HIGH (ref 11.5–15.5)
WBC: 7.8 K/uL (ref 4.0–10.5)
nRBC: 0 % (ref 0.0–0.2)

## 2024-01-06 LAB — BASIC METABOLIC PANEL WITH GFR
Anion gap: 10 (ref 5–15)
Anion gap: 10 (ref 5–15)
BUN: 17 mg/dL (ref 6–20)
BUN: 15 mg/dL (ref 6–20)
CO2: 23 mmol/L (ref 22–32)
CO2: 22 mmol/L (ref 22–32)
Calcium: 8.8 mg/dL — ABNORMAL LOW (ref 8.9–10.3)
Calcium: 8.7 mg/dL — ABNORMAL LOW (ref 8.9–10.3)
Chloride: 101 mmol/L (ref 98–111)
Chloride: 104 mmol/L (ref 98–111)
Creatinine, Ser: 1.32 mg/dL — ABNORMAL HIGH (ref 0.61–1.24)
Creatinine, Ser: 1.28 mg/dL — ABNORMAL HIGH (ref 0.61–1.24)
GFR, Estimated: >60 mL/min (ref >60)
GFR, Estimated: >60 mL/min (ref >60)
Glucose, Bld: 102 mg/dL — ABNORMAL HIGH (ref 70–99)
Glucose, Bld: 114 mg/dL — ABNORMAL HIGH (ref 70–99)
Potassium: 3.1 mmol/L — ABNORMAL LOW (ref 3.5–5.1)
Potassium: 4.1 mmol/L (ref 3.5–5.1)
Sodium: 134 mmol/L — ABNORMAL LOW (ref 135–145)
Sodium: 136 mmol/L (ref 135–145)

## 2024-01-06 LAB — APTT
aPTT: 55 s — ABNORMAL HIGH (ref 24–36)
aPTT: 110 s — ABNORMAL HIGH (ref 24–36)
aPTT: 81 s — ABNORMAL HIGH (ref 24–36)

## 2024-01-06 LAB — MAGNESIUM: Magnesium: 1.9 mg/dL (ref 1.7–2.4)

## 2024-01-06 MED ORDER — FUROSEMIDE 10 MG/ML IJ SOLN
80.0000 mg | Freq: Once | INTRAMUSCULAR | Status: AC
  Administered 2024-01-06: 80 mg via INTRAVENOUS
  Filled 2024-01-06: qty 8

## 2024-01-06 MED ORDER — CHLORHEXIDINE GLUCONATE CLOTH 2 % EX PADS
6.0000 | MEDICATED_PAD | Freq: Every day | CUTANEOUS | Status: DC
  Administered 2024-01-06 – 2024-01-07 (×2): 6 via TOPICAL

## 2024-01-06 MED ORDER — POTASSIUM CHLORIDE CRYS ER 20 MEQ PO TBCR
40.0000 meq | EXTENDED_RELEASE_TABLET | ORAL | Status: AC
  Administered 2024-01-06 (×3): 40 meq via ORAL
  Filled 2024-01-06 (×3): qty 2

## 2024-01-06 MED ORDER — ENSURE PLUS HIGH PROTEIN PO LIQD: 237.0000 mL | Freq: Three times a day (TID) | ORAL | Status: DC

## 2024-01-06 MED ORDER — POTASSIUM CHLORIDE CRYS ER 20 MEQ PO TBCR
40.0000 meq | EXTENDED_RELEASE_TABLET | Freq: Two times a day (BID) | ORAL | Status: DC
Start: 2024-01-06 — End: 2024-01-06

## 2024-01-06 MED ORDER — ADULT MULTIVITAMIN W/MINERALS CH
1.0000 | ORAL_TABLET | Freq: Every day | ORAL | Status: DC
  Administered 2024-01-06 – 2024-01-10 (×5): 1 via ORAL
  Filled 2024-01-06 (×5): qty 1

## 2024-01-06 MED ORDER — MAGNESIUM SULFATE 2 GM/50ML IV SOLN
2.0000 g | Freq: Once | INTRAVENOUS | Status: AC
  Administered 2024-01-06: 2 g via INTRAVENOUS
  Filled 2024-01-06: qty 50

## 2024-01-06 NOTE — Progress Notes (Addendum)
 Advanced Heart Failure Rounding Note   Subjective:    Complaining of pain at [place] site.   Denies CP or SOB  PAP: (28-64)/(20-39) 38/33 CVP:  [4 mmHg-17 mmHg] 4 mmHg CO:  [4.7 L/min-6.4 L/min] 5.8 L/min CI:  [1.74 L/min/m2-2.4 L/min/m2] 2.4 L/min/m2  K 3.1 Co-ox 71%  Objective:   Weight Range:  Vital Signs:   Temp:  [97.7 F (36.5 C)-98.4 F (36.9 C)] 97.9 F (36.6 C) (10/25 0900) Pulse Rate:  [0-108] 69 (10/25 0900) Resp:  [9-24] 22 (10/25 0900) BP: (87-132)/(48-103) 131/80 (10/25 0900) SpO2:  [91 %-99 %] 95 % (10/25 0900) Weight:  [147.6 kg] 147.6 kg (10/25 0458) Last BM Date : 01/05/24  Weight change: Filed Weights   01/05/24 1028 01/06/24 0458  Weight: (!) 147 kg (!) 147.6 kg    Intake/Output:   Intake/Output Summary (Last 24 hours) at 01/06/2024 1317 Last data filed at 01/06/2024 1100 Gross per 24 hour  Intake 1116.37 ml  Output 1250 ml  Net -133.63 ml     Physical Exam: General:  Sitting  up in bed  No resp difficulty HEENT: normal Neck: supple. RIJ swan  .  Cor: Regular rate & rhythm. No rubs, gallops or murmurs. Lungs: clear Abdomen: obese soft, nontender, nondistended. No hepatosplenomegaly. No bruits or masses. Good bowel sounds. Extremities: no cyanosis, clubbing, rash, edema Neuro: alert & orientedx3, cranial nerves grossly intact. moves all 4 extremities w/o difficulty. Affect pleasant  Telemetry: Sinus 60-70s Personally reviewed   Labs: Basic Metabolic Panel: Recent Labs  Lab 01/05/24 1326 01/05/24 1907 01/06/24 0339  NA 141  145 139 134*  K 3.3*  2.7* 3.4* 3.1*  CL  --  106 101  CO2  --  24 23  GLUCOSE  --  96 102*  BUN  --  18 17  CREATININE  --  1.23 1.32*  CALCIUM  --  8.8* 8.8*  MG  --  1.9 1.9    Liver Function Tests: Recent Labs  Lab 01/05/24 1907  AST 25  ALT 30  ALKPHOS 62  BILITOT 0.9  PROT 6.1*  ALBUMIN 3.3*   No results for input(s): LIPASE, AMYLASE in the last 168 hours. No results for  input(s): AMMONIA in the last 168 hours.  CBC: Recent Labs  Lab 01/05/24 1103 01/05/24 1326 01/05/24 1907 01/06/24 0339  WBC 7.1  --  7.6 7.8  NEUTROABS  --   --  3.1  --   HGB 15.1 15.0  13.3 13.7 13.8  HCT 45.6 44.0  39.0 43.6 43.0  MCV 78.6*  --  80.3 79.9*  PLT 186  --  165 158    Cardiac Enzymes: No results for input(s): CKTOTAL, CKMB, CKMBINDEX, TROPONINI in the last 168 hours.  BNP: BNP (last 3 results) Recent Labs    11/24/23 1618 12/25/23 1139 01/05/24 1907  BNP 1,700.4* 315.6* 674.8*    ProBNP (last 3 results) No results for input(s): PROBNP in the last 8760 hours.    Other results:  Imaging: CARDIAC CATHETERIZATION Result Date: 01/05/2024 Findings: RA = 2 RV = 22/7 PA = 29/18 (21) PCW = 5 Fick cardiac output/index = 5.1/1.9 Thermo CO/CI = 5.9/2.2 PVR = 3.1 (Fick) TD (2.7) Ao sat = 96% PA sat = 58*, 66% PAPi = 5.5 Assessment: 1. Low filling pressures with moderately reduce CO Plan/Discussion: Plan VAD next week. Jnai Snellgrove, MD 2:19 PM    Medications:     Scheduled Medications:  allopurinol   100 mg Oral  Daily   Chlorhexidine  Gluconate Cloth  6 each Topical Daily   colchicine   0.6 mg Oral BID   digoxin   0.125 mg Oral Daily   ezetimibe   10 mg Oral QHS   feeding supplement  237 mL Oral TID BM   HYDROcodone -acetaminophen   1 tablet Oral Once   mexiletine  300 mg Oral BID   multivitamin with minerals  1 tablet Oral Daily   potassium chloride   40 mEq Oral Q3H   sodium chloride  flush  3 mL Intravenous Q12H   spironolactone   25 mg Oral Daily    Infusions:  sodium chloride      0.9 % NaCl with KCl 40 mEq / L 40 mL/hr at 01/06/24 0800   heparin  1,850 Units/hr (01/06/24 1215)   magnesium sulfate bolus IVPB      PRN Medications: sodium chloride , acetaminophen , albuterol , ondansetron  (ZOFRAN ) IV, sodium chloride  flush   Plan/Discussion:    1. 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  PVCs may also contribute, 25.3% PVCs with NSVT on Zio monitor in 5/25 -Echo 9/25 with EF < 20%, severe LV dilation, moderate RV enlargement with low normal systolic function, moderate MR, dilated IVC.   Re-admitted in 9/25.  Had been out of most medications. RHC markedly elevated filling pressures with low CI (1.6 Fick/1.77 thermo). Cardiac MRI LV EF 14%, RV EF 29%, suspect mixed ischemic/nonischemic cardiomyopathy with scarring process due to MI in the mid inferior/inferolateral wall and non-coronary type mid-wall LGE in the basal septum.  He was started on milrinone  and diuresed, then milrinone  weaned off.   - RHC 89/75/74 with low filling pressures and reduced CI (1.9 TD, 2.2 Fick). - NYHA class IV symptoms, - Volume back up today.Give lasix  80 IV x 1. Restart home torsemide  - Can pull swan - Hold entresto  and jardiance  preop - Continue digoxin  - spironolactone  25 mg daily.  - Off Toprol  XL with low output.  - With weight and smoking (quit < 1 month), not a candidate for heart transplant.   - Plan for dental extractions 10/27 and VAD implant 10/30.     2. CAD:  LHC 5/24 at [facility] showed severe multivessel CAD including 80% mid RCA, 100% RPDA, 100% mid LCx, 80% OM1, 80% mid-distal LAD. Medical management recommended by [facility] cardiology 05/24.  Repeat coronary angiography in 9/25 showed occluded PDA and mid AV LCx, 90% ramus, severe diffuse disease in right PLV, nonobstructive LAD disease.  With markedly low EF and without severe LAD disease, CABG nor a durable option.  - No ASA given anticoagulation use and stable CAD.  - Continue Zetia , has not been able to tolerate statin.  Refer to lipid clinic for Repatha as outpatient. Lp(a) is very elevated.  - Continue statins   3. PVCs: Zio 5/25 with 25.3%, NSVT noted burden.  He did not tolerate amiodarone  well and was started on mexiletine.  - Follow on tele - Continue mexilitene   - Will need sleep study.    4.  Morbid obesity:  - Body mass index is 40.67 kg/m. - consider GLP1RA in the future .  5. Tobacco use:  - Has quit < 88month .  6. Anticoagulation: Possible LV thrombus by echo in the past. No LV thrombus on cardiac MRI 9/25.  -  Off Eliquis . On heparin . No bleeding  7. Gout: Very symptomatic, knees and ankles.   - Continue allopurinol  and colchicine   8. Hypokalemia - K 3.1  supp   Length of Stay: 1   Nyemah Watton 01/06/2024, 1:17 PM  Advanced Heart Failure Team Pager [phone] (M-F; 7a - 4p)  Please contact [facility] for night-coverage after hours (4p -7a ) and weekends on [email or web address]

## 2024-01-06 NOTE — Progress Notes (Signed)
 PHARMACY - ANTICOAGULATION CONSULT NOTE  Pharmacy Consult for heparin  Indication: LV thrombus  Allergies  Allergen Reactions   Lactose Intolerance (Gi) Diarrhea   Statins     myalgias    Patient Measurements: Height: 6' 3 (190.5 cm) Weight: (!) 147 kg (324 lb) IBW/kg (Calculated) : 84.5 HEPARIN  DW (KG): 118  Vital Signs: Temp: 98.4 F (36.9 C) (10/25 0000) Temp Source: Core (10/25 0000) BP: 110/73 (10/25 0000) Pulse Rate: 62 (10/25 0000)  Labs: Recent Labs    01/05/24 1103 01/05/24 1326 01/05/24 1907 01/06/24 0128  HGB 15.1 15.0  13.3 13.7  --   HCT 45.6 44.0  39.0 43.6  --   PLT 186  --  165  --   APTT  --   --   --  55*  HEPARINUNFRC  --   --   --  0.25*  CREATININE  --   --  1.23  --     Estimated Creatinine Clearance: 115 mL/min (by C-G formula based on SCr of 1.23 mg/dL).  Assessment: 77 yoM admitted for RHC. Pt on apixaban  PTA for hx LV thrombus, now to start IV heparin  post/cath. LD apixaban  10/22.  AM: heparin  level 0.25 (below goal, not quite correlated with aPTT) and aPTT below goal on 1600 units/hr. Per RN, no issues with gtt running continuously or signs/symptoms of bleeding.   Goal of Therapy:  Heparin  level 0.3-0.7 units/ml aPTT 66-102 seconds Monitor platelets by anticoagulation protocol: Yes   Plan:  Increase heparin  to 2000 units/h  Check 6h aPTT Monitor aPTT until correlates with heparin  level CBC daily  Lynwood Poplar, PharmD, BCPS Clinical Pharmacist 01/06/2024 2:21 AM

## 2024-01-06 NOTE — Progress Notes (Addendum)
 Initial Nutrition Assessment  DOCUMENTATION CODES:   Not applicable  INTERVENTION:   -Liberalize diet to 2 gram sodium for wider variety of meal selections -MVI with minerals daily -Ensure Plus High Protein po TID, each supplement provides 350 kcal and 20 grams of protein  -RD provided Low Sodium Nutrition Therapy handout from [facility] Nutrition Care Manual; attached to AVS/ discharge summary  NUTRITION DIAGNOSIS:   Increased nutrient needs related to chronic illness (CHF) as evidenced by estimated needs.  GOAL:   Patient will meet greater than or equal to 90% of their needs  MONITOR:   PO intake, Supplement acceptance  REASON FOR ASSESSMENT:   Consult Assessment of nutrition requirement/status, LVAD Eval  ASSESSMENT:   48 year old male with history of chronic HFrEF, CAD, PVCs, morbid obesity, tobacco use, possible prior LV thrombus, gout. Admitted for preVAD optimization.  Pt admitted with CHF and preVAD optimization.   10/24- right heart cath revealed with low filling pressures and reduced Cl; swan left in for hemodynamic monitoring  Reviewed I/O's: +106 ml x 24 hours  UOP: 750 ml x 24 hours  Patient unavailable at time of visit. Attempted to speak with patient via call to hospital room phone, however, unable to reach. RD unable to obtain further nutrition-related history or complete nutrition-focused physical exam at this time.    Per Advanced Heart Failure Team notes, patient was admitted on 05/19/23 with progressive CHF secondary to medication noncompliance; he diuresed well with IV lasix  and discharged with a weight of 360#. He also admitted on 11/2023 due to CHF exacerbation. Plan to optimize heart failure this admission with pre-operative dental extractions on 01/08/24 and HM III VAD implantation on 01/11/24.   Patient currently on a heart healthy diet; noted meal completions 75%.   Reviewed weight history; patient has experienced a 5.3% weight loss over the past 3  months, which is not significant for time frame. Per nursing assessment, patient with mild pitting edema, which may be masking true weight loss as well as potential fat and muscle depletions. Unsure of obesity/ BMI status at this time due to edema.   Patient with increased nutritional needs for medical optimization for surgery; he would greatly benefit from addition of oral nutrition supplements.   Palliative care consult pending.   Medications reviewed and include potassium chloride , aldactone , and 0.9% NaCl with KCl 40 mEq/L infusion @ 40 ml/hr.   Labs reviewed: Na: 134, K: 3.1.   Diet Order:   Diet Order             Diet Heart Room service appropriate? Yes; Fluid consistency: Thin  Diet effective now                   EDUCATION NEEDS:   No education needs have been identified at this time  Skin:  Skin Assessment: Reviewed RN Assessment  Last BM:  01/05/24  Height:   Ht Readings from Last 1 Encounters:  01/05/24 6' 3 (1.905 m)    Weight:   Wt Readings from Last 1 Encounters:  01/06/24 (!) 147.6 kg    Ideal Body Weight:  89.1 kg  BMI:  Body mass index is 40.67 kg/m.  Estimated Nutritional Needs:   Kcal:  7549-7349  Protein:  135-150 grams  Fluid:  2.0-2.2 L    Margery ORN, RD, LDN, CDCES Registered Dietitian III Certified Diabetes Care and Education Specialist If unable to reach this RD, please use RD Inpatient group chat on secure chat between hours of  8am-4 pm daily

## 2024-01-06 NOTE — Consult Note (Signed)
 Palliative Care Consult Note                                  Date: 01/06/2024   Patient Name: Philip Monroe  DOB: July 03, 1975  MRN: [id number]  Age / Sex: 48 y.o., male  PCP: Pcp, No Referring Physician: Cherrie Toribio SAUNDERS, MD  Reason for Consultation: Other: LVAD  HPI/Patient Profile: 48 y.o. male  with past medical history of multivessel CAD, chronic HFrEF, inoncompaction CM, HTN, and previous ETOH use admitted on 01/05/2024 after RHC for HF optimization, dental extraction (10/27) and HM III VAD implantation on 01/11/2024.   Past Medical History:  Diagnosis Date   Aortic root dilatation    a. 07/2022 Echo: Ao root 4.4cm, Asc Ao 4.0cm.   Asthma    as a child, no problems as an adult, rarely uses inhaler   CAD (coronary artery disease)    a. 08/2017 Cath: LM nl, LAD 58m/d, D1 70ost, LCX 162m, OM1 90 inf branch, 60 sup branch, OM2 small, RCA 30 diffuse, RPDA 100 w/ L->R collats; b. 07/2022 Echo: LM nl, LAD 72m, D1 80, LCX 170m, OM1 80, RCA 25m, RPDA 100-->No targets for PCI/CABG-->Med rx.   CHF (congestive heart failure) (HCC)    takes lasix    Chronic HFrEF (heart failure with reduced ejection fraction) (HCC)    a. 03/2015 Echo: EF 35-40%; b. 08/2017 Echo: EF 15%; c. 08/2017 cMRI: EF 35%; d. 06/2018 Echo: EF 10-15%; e. 07/2022 Echo: EF 15-20%, grIII DD, mild MR, sev dil LA, mod-sev PH, Ao root 4.4cm, Asc Ao 4.0cm; f. 07/2022 RHC: RA 4, RV 38/3, PA 43/18 (26), PCWP 11. CO/CI 5.3/1.8.   CKD (chronic kidney disease), stage II    Diabetes mellitus without complication (HCC)    type 2   Dyspnea    with exertion   Gout    Hyperlipidemia LDL goal <70    Hypertension    Ischemic cardiomyopathy    a. 03/2015 Echo: EF 35-40%; b. 08/2017 Echo: EF 15%; c. 08/2017 cMRI: EF 35%; d. 06/2018 Echo: EF 10-15%; e. 07/2022 Echo: EF 15-20%, grIII DD.   IVCD (intraventricular conduction defect)    Left ventricular noncompaction [facility])    Morbid obesity (HCC)     NSVT (nonsustained ventricular tachycardia) (HCC)    a. 09/2022 Event monitor: predominantly sinus rhythm, avg HR 81 (46-167). 17 runs NSVT (fastest/longest 13 beats x 167). Rare PACs, 17.5% PVC burden.   PVC's (premature ventricular contractions)    a. 09/2022 Event Monitor: 17.5% PVC burden.   Statin intolerance    Tobacco abuse     Subjective:   I have reviewed medical records including EPIC notes, labs and imaging, assessed the patient and then met with the patient to discuss diagnosis prognosis, GOC, EOL wishes, disposition and options.  I introduced Palliative Medicine as specialized medical care for people living with serious illness. It focuses on providing relief from symptoms and stress of a serious illness. The goal is to improve quality of life for both the patient and the family.  Today's Discussion: Patient has a good understanding of his heart failure and upcoming LVAD implantation.  He jokes that he will be a bionic man after the procedure-- with the pump, leads, and batteries. The patient is hopeful the LVAD will improve his quality of life by improving his fatigue and allowing him to be more active. We discussed potential complications after LVAD such as dialysis or mechanical ventilation. We discussed requirements after the LVAD including the need for caregiver.  He shared his daughter Philip Monroe who lives with him will be his designated caregiver.   We discussed advanced directives. The patient is interested in completing a HCPOA document naming his older daughter Philip Monroe as education officer, community. Him and Philip Monroe have discussed what is important to him regarding his goals of care moving forward. He trusts she will be level headed and able to make any difficult decisions. Spiritual care consulted for assistance in [facility] document.  Patient has four children.  Three of his children are grown and his youngest is 54 years old.  He has not yet told his 47 year old that he is in the hospital or  about the upcoming procedure.  I told him I would reach out to the LVAD team to see if they had resources to help with this.  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  The patient shared he has felt bad for a long time. Prior to becoming symptomatic from his heart failure he enjoyed fishing.    Discussed the importance of continued conversation with family and the medical providers regarding overall plan of care and treatment options, ensuring decisions are within the context of the patient's values and GOCs.  Questions and concerns were addressed. PMT will continue to support holistically.  Review of Systems  Constitutional:  Positive for fatigue.  Respiratory:  Positive for shortness of breath.     Objective:   Primary Diagnoses: Present on Admission: **None**   Physical Exam Vitals reviewed.  Constitutional:      General: He is not in acute distress. HENT:     Head: Normocephalic and atraumatic.  Cardiovascular:     Rate and Rhythm: Normal rate.  Pulmonary:     Effort: Pulmonary effort is normal.  Skin:    General: Skin is warm and dry.  Neurological:     Mental Status: He is alert and oriented to person, place, and time.  Psychiatric:        Mood and Affect: Mood normal.        Behavior: Behavior normal.        Thought Content: Thought content normal.        Judgment: Judgment normal.     Vital Signs:  BP (!) 138/93   Pulse 69   Temp 97.9 F (36.6 C)   Resp 17   Ht 6' 3 (1.905 m)   Wt (!) 147.6 kg   SpO2 97%   BMI 40.67 kg/m    Advanced Care Planning:   Existing Vynca/ACP Documentation: None  Primary Decision Maker: PATIENT  Code Status/Advance Care Planning: Full code   Assessment & Plan:   SUMMARY OF RECOMMENDATIONS   LVAD planned 10/30 Spiritual care consulted for HCPOA assistance PMT will follow   Discussed with: bedside RN  Time Total: 65 minutes    Thank you for  allowing us  to participate in the care of Philip Monroe PMT will continue to support holistically.    Signed by: [name] Palin, NP Palliative Medicine Team  Team Phone # [phone] (Nights/Weekends)  01/06/2024, 2:54 PM

## 2024-01-06 NOTE — Discharge Instructions (Signed)

## 2024-01-06 NOTE — Progress Notes (Signed)
 PHARMACY - ANTICOAGULATION CONSULT NOTE  Pharmacy Consult for heparin  Indication: LV thrombus  Allergies  Allergen Reactions   Lactose Intolerance (Gi) Diarrhea   Statins     myalgias    Patient Measurements: Height: 6' 3 (190.5 cm) Weight: (!) 147.6 kg (325 lb 6.4 oz) IBW/kg (Calculated) : 84.5 HEPARIN  DW (KG): 118  Vital Signs: Temp: 98.7 F (37.1 C) (10/25 1920) Temp Source: Oral (10/25 1920) BP: 110/89 (10/25 1600) Pulse Rate: 69 (10/25 0900)  Labs: Recent Labs    01/05/24 1103 01/05/24 1326 01/05/24 1907 01/06/24 0128 01/06/24 0339 01/06/24 1005 01/06/24 1834  HGB 15.1 15.0  13.3 13.7  --  13.8  --   --   HCT 45.6 44.0  39.0 43.6  --  43.0  --   --   PLT 186  --  165  --  158  --   --   APTT  --   --   --  55*  --  110* 81*  HEPARINUNFRC  --   --   --  0.25*  --   --  0.45  CREATININE  --   --  1.23  --  1.32*  --  1.28*    Estimated Creatinine Clearance: 110.7 mL/min (A) (by C-G formula based on SCr of 1.28 mg/dL (H)).  Assessment: 73 yoM admitted for RHC. Pt on apixaban  PTA for hx LV thrombus, now to start IV heparin  post/cath. LD apixaban  10/22.  Heparin  level and aPTT both therapeutic, correlating so will D/C aPTT monitoring.  Goal of Therapy:  Heparin  level 0.3-0.7 units/ml aPTT 66-102 seconds Monitor platelets by anticoagulation protocol: Yes   Plan:  Continue heparin  1850 units/h  Daily heparin  level and CBC  Ozell Jamaica, PharmD, BCPS, [facility] Clinical Pharmacist [phone] Please check AMION for all [facility] Pharmacy numbers 01/06/2024

## 2024-01-06 NOTE — Plan of Care (Signed)

## 2024-01-06 NOTE — Progress Notes (Signed)
 PHARMACY - ANTICOAGULATION CONSULT NOTE  Pharmacy Consult for heparin  Indication: LV thrombus  Allergies  Allergen Reactions   Lactose Intolerance (Gi) Diarrhea   Statins     myalgias    Patient Measurements: Height: 6' 3 (190.5 cm) Weight: (!) 147.6 kg (325 lb 6.4 oz) IBW/kg (Calculated) : 84.5 HEPARIN  DW (KG): 118  Vital Signs: Temp: 97.9 F (36.6 C) (10/25 0900) Temp Source: Core (10/25 0800) BP: 131/80 (10/25 0900) Pulse Rate: 69 (10/25 0900)  Labs: Recent Labs    01/05/24 1103 01/05/24 1326 01/05/24 1907 01/06/24 0128 01/06/24 0339 01/06/24 1005  HGB 15.1 15.0  13.3 13.7  --  13.8  --   HCT 45.6 44.0  39.0 43.6  --  43.0  --   PLT 186  --  165  --  158  --   APTT  --   --   --  55*  --  110*  HEPARINUNFRC  --   --   --  0.25*  --   --   CREATININE  --   --  1.23  --  1.32*  --     Estimated Creatinine Clearance: 107.3 mL/min (A) (by C-G formula based on SCr of 1.32 mg/dL (H)).  Assessment: 73 yoM admitted for RHC. Pt on apixaban  PTA for hx LV thrombus, now to start IV heparin  post/cath. LD apixaban  10/22.  01/06/24 1200: aPTT 110, supratherapeutic at heparin  2000 units/hr. No issues with infusion running or signs of bleeding per RN. CBC stable (Hgb 13.8, PLT 158).   Goal of Therapy:  Heparin  level 0.3-0.7 units/ml aPTT 66-102 seconds Monitor platelets by anticoagulation protocol: Yes   Plan:  Decrease heparin  to 1850 units/h  Check 6h aPTT Monitor aPTT until correlates with heparin  level CBC daily  Morna Breach, PharmD PGY2 Cardiology Pharmacy Resident 01/06/2024 11:39 AM

## 2024-01-07 ENCOUNTER — Encounter (HOSPITAL_COMMUNITY): Payer: Self-pay | Admitting: Internal Medicine

## 2024-01-07 DIAGNOSIS — I5022 Chronic systolic (congestive) heart failure: Secondary | ICD-10-CM | POA: Diagnosis not present

## 2024-01-07 LAB — CBC
HCT: 42.2 % (ref 39.0–52.0)
Hemoglobin: 13.5 g/dL (ref 13.0–17.0)
MCH: 25.2 pg — ABNORMAL LOW (ref 26.0–34.0)
MCHC: 32 g/dL (ref 30.0–36.0)
MCV: 78.9 fL — ABNORMAL LOW (ref 80.0–100.0)
Platelets: 163 K/uL (ref 150–400)
RBC: 5.35 MIL/uL (ref 4.22–5.81)
RDW: 20.4 % — ABNORMAL HIGH (ref 11.5–15.5)
WBC: 8.5 K/uL (ref 4.0–10.5)
nRBC: 0 % (ref 0.0–0.2)

## 2024-01-07 LAB — BASIC METABOLIC PANEL WITH GFR
Anion gap: 10 (ref 5–15)
BUN: 14 mg/dL (ref 6–20)
CO2: 21 mmol/L — ABNORMAL LOW (ref 22–32)
Calcium: 8.8 mg/dL — ABNORMAL LOW (ref 8.9–10.3)
Chloride: 105 mmol/L (ref 98–111)
Creatinine, Ser: 1.16 mg/dL (ref 0.61–1.24)
GFR, Estimated: >60 mL/min (ref >60)
Glucose, Bld: 103 mg/dL — ABNORMAL HIGH (ref 70–99)
Potassium: 3.9 mmol/L (ref 3.5–5.1)
Sodium: 136 mmol/L (ref 135–145)

## 2024-01-07 LAB — HEPARIN LEVEL (UNFRACTIONATED): Heparin Unfractionated: 0.53 [IU]/mL (ref 0.30–0.70)

## 2024-01-07 LAB — MAGNESIUM: Magnesium: 2.2 mg/dL (ref 1.7–2.4)

## 2024-01-07 MED ORDER — TORSEMIDE 20 MG PO TABS
40.0000 mg | ORAL_TABLET | Freq: Every day | ORAL | Status: DC
  Administered 2024-01-07 – 2024-01-10 (×4): 40 mg via ORAL
  Filled 2024-01-07 (×4): qty 2

## 2024-01-07 MED ORDER — POTASSIUM CHLORIDE CRYS ER 20 MEQ PO TBCR
30.0000 meq | EXTENDED_RELEASE_TABLET | Freq: Once | ORAL | Status: AC
  Administered 2024-01-07: 30 meq via ORAL
  Filled 2024-01-07: qty 1

## 2024-01-07 NOTE — Consult Note (Signed)
 Reason for Consult:Dental, Pre VAD Referring Physician: Cherrie, MD  Philip Monroe is an 48 y.o. male.  CC:No dental pain.   HPI: Hasn't seen a dentist in years.  Past Medical History:  Diagnosis Date   Aortic root dilatation    a. 07/2022 Echo: Ao root 4.4cm, Asc Ao 4.0cm.   Asthma    as a child, no problems as an adult, rarely uses inhaler   CAD (coronary artery disease)    a. 08/2017 Cath: LM nl, LAD 5m/d, D1 70ost, LCX 1107m, OM1 90 inf branch, 60 sup branch, OM2 small, RCA 30 diffuse, RPDA 100 w/ L->R collats; b. 07/2022 Echo: LM nl, LAD 19m, D1 80, LCX 158m, OM1 80, RCA 44m, RPDA 100-->No targets for PCI/CABG-->Med rx.   CHF (congestive heart failure) (HCC)    takes lasix    Chronic HFrEF (heart failure with reduced ejection fraction) (HCC)    a. 03/2015 Echo: EF 35-40%; b. 08/2017 Echo: EF 15%; c. 08/2017 cMRI: EF 35%; d. 06/2018 Echo: EF 10-15%; e. 07/2022 Echo: EF 15-20%, grIII DD, mild MR, sev dil LA, mod-sev PH, Ao root 4.4cm, Asc Ao 4.0cm; f. 07/2022 RHC: RA 4, RV 38/3, PA 43/18 (26), PCWP 11. CO/CI 5.3/1.8.   CKD (chronic kidney disease), stage II    Diabetes mellitus without complication (HCC)    type 2   Dyspnea    with exertion   Gout    Hyperlipidemia LDL goal <70    Hypertension    Ischemic cardiomyopathy    a. 03/2015 Echo: EF 35-40%; b. 08/2017 Echo: EF 15%; c. 08/2017 cMRI: EF 35%; d. 06/2018 Echo: EF 10-15%; e. 07/2022 Echo: EF 15-20%, grIII DD.   IVCD (intraventricular conduction defect)    Left ventricular noncompaction [facility])    Morbid obesity (HCC)    NSVT (nonsustained ventricular tachycardia) (HCC)    a. 09/2022 Event monitor: predominantly sinus rhythm, avg HR 81 (46-167). 17 runs NSVT (fastest/longest 13 beats x 167). Rare PACs, 17.5% PVC burden.   PVC's (premature ventricular contractions)    a. 09/2022 Event Monitor: 17.5% PVC burden.   Statin intolerance    Tobacco abuse     Past Surgical History:  Procedure Laterality Date   RIGHT/LEFT HEART CATH AND  CORONARY ANGIOGRAPHY N/A 11/28/2023   Procedure: RIGHT/LEFT HEART CATH AND CORONARY ANGIOGRAPHY;  Surgeon: Rolan Ezra RAMAN, MD;  Location: [facility] INVASIVE CV LAB;  Service: Cardiovascular;  Laterality: N/A;    History reviewed. No pertinent family history.  Social History:  reports that he quit smoking about 8 years ago. His smoking use included cigarettes. He has never used smokeless tobacco. He reports that he does not currently use alcohol after a past usage of about 36.0 standard drinks of alcohol per week. He reports that he does not use drugs.  Allergies:  Allergies  Allergen Reactions   Lactose Intolerance (Gi) Diarrhea   Statins     myalgias    Medications: I have reviewed the patient's current medications. Prior to Admission:  Medications Prior to Admission  Medication Sig Dispense Refill Last Dose/Taking   allopurinol  (ZYLOPRIM ) 100 MG tablet Take 1 tablet (100 mg total) by mouth daily. 30 tablet 2    colchicine  0.6 MG tablet Take 1 tablet (0.6 mg total) by mouth 2 (two) times daily. 60 tablet 6    digoxin  (LANOXIN ) 0.125 MG tablet Take 1 tablet (0.125 mg total) by mouth daily. 30 tablet 1    empagliflozin  (JARDIANCE ) 10 MG TABS tablet Take 1 tablet (10  mg total) by mouth daily. 30 tablet 1 01/04/2024   ezetimibe  (ZETIA ) 10 MG tablet Take 1 tablet (10 mg total) by mouth at bedtime. 90 tablet 0    mexiletine (MEXITIL ) 150 MG capsule Take 2 capsules (300 mg total) by mouth 2 (two) times daily. 120 capsule 0    sacubitril -valsartan  (ENTRESTO ) 24-26 MG Take 1 tablet by mouth 2 (two) times daily. 60 tablet 6    spironolactone  (ALDACTONE ) 25 MG tablet Take 1 tablet (25 mg total) by mouth daily. 30 tablet 1    torsemide  (DEMADEX ) 20 MG tablet Take 2 tablets (40 mg total) by mouth daily. 30 tablet 1    albuterol  (VENTOLIN  HFA) 108 (90 Base) MCG/ACT inhaler Inhale 1-2 puffs into the lungs every 6 (six) hours as needed for wheezing or shortness of breath.   Taking As Needed   ELIQUIS  5 MG  TABS tablet Take 1 tablet (5 mg total) by mouth 2 (two) times daily. (Patient not taking: Reported on 01/04/2024) 60 tablet 1 01/01/2024    Results for orders placed or performed during the hospital encounter of 01/05/24 (from the past 48 hours)  APTT     Status: Abnormal   Collection Time: 01/06/24  1:28 AM  Result Value Ref Range   aPTT 55 (H) 24 - 36 seconds    Comment:        IF BASELINE aPTT IS ELEVATED, SUGGEST PATIENT RISK ASSESSMENT BE USED TO DETERMINE APPROPRIATE ANTICOAGULANT THERAPY. Performed at [facility] Lab, [address] [place], [place] [address]   Heparin  level (unfractionated)     Status: Abnormal   Collection Time: 01/06/24  1:28 AM  Result Value Ref Range   Heparin  Unfractionated 0.25 (L) 0.30 - 0.70 IU/mL    Comment: (NOTE) The clinical reportable range upper limit is being lowered to >1.10 to align with the FDA approved guidance for the current laboratory assay.  If heparin  results are below expected values, and patient dosage has  been confirmed, suggest follow up testing of antithrombin III levels. Performed at [facility] Lab, [address] [place], [place] [address]   Basic metabolic panel     Status: Abnormal   Collection Time: 01/06/24  3:39 AM  Result Value Ref Range   Sodium 134 (L) 135 - 145 mmol/L   Potassium 3.1 (L) 3.5 - 5.1 mmol/L   Chloride 101 98 - 111 mmol/L   CO2 23 22 - 32 mmol/L   Glucose, Bld 102 (H) 70 - 99 mg/dL    Comment: Glucose reference range applies only to samples taken after fasting for at least 8 hours.   BUN 17 6 - 20 mg/dL   Creatinine, Ser 8.67 (H) 0.61 - 1.24 mg/dL   Calcium 8.8 (L) 8.9 - 10.3 mg/dL   GFR, Estimated >39 >39 mL/min    Comment: (NOTE) Calculated using the CKD-EPI Creatinine Equation (2021)    Anion gap 10 5 - 15    Comment: Performed at [facility] Lab, [address], [place], [place] [address]  Cooxemetry Panel (carboxy, met, total hgb, O2 sat)     Status: Abnormal   Collection  Time: 01/06/24  3:39 AM  Result Value Ref Range   Total hemoglobin 13.8 12.0 - 16.0 g/dL   O2 Saturation 29.4 %   Carboxyhemoglobin 1.9 (H) 0.5 - 1.5 %   Methemoglobin 1.3 0.0 - 1.5 %    Comment: Performed at [facility] Lab, [address], [place], [place] [address]  CBC     Status: Abnormal   Collection Time: 01/06/24  3:39 AM  Result Value Ref Range   WBC 7.8 4.0 - 10.5 K/uL   RBC 5.38 4.22 - 5.81 MIL/uL   Hemoglobin 13.8 13.0 - 17.0 g/dL   HCT 56.9 60.9 - 47.9 %   MCV 79.9 (L) 80.0 - 100.0 fL   MCH 25.7 (L) 26.0 - 34.0 pg   MCHC 32.1 30.0 - 36.0 g/dL   RDW 79.6 (H) 88.4 - 84.4 %   Platelets 158 150 - 400 K/uL   nRBC 0.0 0.0 - 0.2 %    Comment: Performed at [facility], [address] [place], [place] [address]  Magnesium     Status: None   Collection Time: 01/06/24  3:39 AM  Result Value Ref Range   Magnesium 1.9 1.7 - 2.4 mg/dL    Comment: Performed at [facility] Lab, [address] [place], [place] [address]  APTT     Status: Abnormal   Collection Time: 01/06/24 10:05 AM  Result Value Ref Range   aPTT 110 (H) 24 - 36 seconds    Comment:        IF BASELINE aPTT IS ELEVATED, SUGGEST PATIENT RISK ASSESSMENT BE USED TO DETERMINE APPROPRIATE ANTICOAGULANT THERAPY. Performed at [facility] Lab, [address], [place], [place] [address]   Basic metabolic panel with GFR     Status: Abnormal   Collection Time: 01/06/24  6:34 PM  Result Value Ref Range   Sodium 136 135 - 145 mmol/L   Potassium 4.1 3.5 - 5.1 mmol/L   Chloride 104 98 - 111 mmol/L   CO2 22 22 - 32 mmol/L   Glucose, Bld 114 (H) 70 - 99 mg/dL    Comment: Glucose reference range applies only to samples taken after fasting for at least 8 hours.   BUN 15 6 - 20 mg/dL   Creatinine, Ser 8.71 (H) 0.61 - 1.24 mg/dL   Calcium 8.7 (L) 8.9 - 10.3 mg/dL   GFR, Estimated >39 >39 mL/min    Comment: (NOTE) Calculated using the CKD-EPI Creatinine Equation (2021)    Anion gap 10 5 - 15    Comment:  Performed at [facility] Lab, [address] [place], [place] [address]  APTT     Status: Abnormal   Collection Time: 01/06/24  6:34 PM  Result Value Ref Range   aPTT 81 (H) 24 - 36 seconds    Comment:        IF BASELINE aPTT IS ELEVATED, SUGGEST PATIENT RISK ASSESSMENT BE USED TO DETERMINE APPROPRIATE ANTICOAGULANT THERAPY. Performed at [facility] Lab, [address], [place], [place] [address]   Heparin  level (unfractionated)     Status: None   Collection Time: 01/06/24  6:34 PM  Result Value Ref Range   Heparin  Unfractionated 0.45 0.30 - 0.70 IU/mL    Comment: (NOTE) The clinical reportable range upper limit is being lowered to >1.10 to align with the FDA approved guidance for the current laboratory assay.  If heparin  results are below expected values, and patient dosage has  been confirmed, suggest follow up testing of antithrombin III levels. Performed at [facility] Lab, [address] [place], [place] [address]   Basic metabolic panel     Status: Abnormal   Collection Time: 01/07/24  3:08 AM  Result Value Ref Range   Sodium 136 135 - 145 mmol/L   Potassium 3.9 3.5 -  5.1 mmol/L   Chloride 105 98 - 111 mmol/L   CO2 21 (L) 22 - 32 mmol/L   Glucose, Bld 103 (H) 70 - 99 mg/dL    Comment: Glucose reference range applies only to samples taken after fasting for at least 8 hours.   BUN 14 6 - 20 mg/dL   Creatinine, Ser 8.83 0.61 - 1.24 mg/dL   Calcium 8.8 (L) 8.9 - 10.3 mg/dL   GFR, Estimated >39 >39 mL/min    Comment: (NOTE) Calculated using the CKD-EPI Creatinine Equation (2021)    Anion gap 10 5 - 15    Comment: Performed at [facility] Lab, [address] [place], [place] [address]  Heparin  level (unfractionated)     Status: None   Collection Time: 01/07/24  3:08 AM  Result Value Ref Range   Heparin  Unfractionated 0.53 0.30 - 0.70 IU/mL    Comment: (NOTE) The clinical reportable range upper limit is being lowered to >1.10 to align with the FDA  approved guidance for the current laboratory assay.  If heparin  results are below expected values, and patient dosage has  been confirmed, suggest follow up testing of antithrombin III levels. Performed at [facility], [address] [place], [place] [address]   CBC     Status: Abnormal   Collection Time: 01/07/24  3:08 AM  Result Value Ref Range   WBC 8.5 4.0 - 10.5 K/uL   RBC 5.35 4.22 - 5.81 MIL/uL   Hemoglobin 13.5 13.0 - 17.0 g/dL   HCT 57.7 60.9 - 47.9 %   MCV 78.9 (L) 80.0 - 100.0 fL   MCH 25.2 (L) 26.0 - 34.0 pg   MCHC 32.0 30.0 - 36.0 g/dL   RDW 79.5 (H) 88.4 - 84.4 %   Platelets 163 150 - 400 K/uL   nRBC 0.0 0.0 - 0.2 %    Comment: Performed at [facility] Lab, [address] [place], [place] [address]  Magnesium     Status: None   Collection Time: 01/07/24  3:08 AM  Result Value Ref Range   Magnesium 2.2 1.7 - 2.4 mg/dL    Comment: Performed at [facility] Lab, [address], [place], [place] [address]    No results found.  ROS Weight (!) 146.5 kg. General appearance: alert, cooperative, delirious, no distress, and morbidly obese Head: Normocephalic, without obvious abnormality, atraumatic Eyes: negative Nose: Nares normal. Septum midline. Mucosa normal. No drainage or sinus tenderness. Throat: Multiple dentl caries teeth # 4, 5, 17, 30. No edema, fluctuance, masses, or trismus. Pharynx clear.  Neck: no adenopathy and supple, symmetrical, trachea midline  Assessment/Plan: 48 y.o. male with CAD, Chronic HFrEF, HTN, CKD, IVCD, Asthma, DM with non-restorable teeth x 4. Plan extraction of 4 teeth in preparation for VAD surgery.    Glendia Primrose 01/07/2024, 9:21 PM

## 2024-01-07 NOTE — Progress Notes (Signed)
 PHARMACY - ANTICOAGULATION CONSULT NOTE  Pharmacy Consult for heparin  Indication: LV thrombus  Allergies  Allergen Reactions   Lactose Intolerance (Gi) Diarrhea   Statins     myalgias    Patient Measurements: Height: 6' 3 (190.5 cm) Weight: (!) 146.7 kg (323 lb 6.6 oz) IBW/kg (Calculated) : 84.5 HEPARIN  DW (KG): 118  Vital Signs: Temp: 98.1 F (36.7 C) (10/26 0726) Temp Source: Oral (10/26 0726) BP: 123/77 (10/26 0800) Pulse Rate: 44 (10/26 0800)  Labs: Recent Labs    01/05/24 1907 01/06/24 0128 01/06/24 0339 01/06/24 1005 01/06/24 1834 01/07/24 0308  HGB 13.7  --  13.8  --   --  13.5  HCT 43.6  --  43.0  --   --  42.2  PLT 165  --  158  --   --  163  APTT  --  55*  --  110* 81*  --   HEPARINUNFRC  --  0.25*  --   --  0.45 0.53  CREATININE 1.23  --  1.32*  --  1.28* 1.16    Estimated Creatinine Clearance: 121.8 mL/min (by C-G formula based on SCr of 1.16 mg/dL).  Assessment: 60 yoM admitted for RHC. Pt on apixaban  PTA for hx LV thrombus, now to start IV heparin  post/cath. LD apixaban  10/22.  01/07/24 AM: Heparin  level 0.53, therapeutic at heparin  1850 units/hr. No issues with infusion running or signs of bleeding per RN. CBC stable (Hgb 13.5, PLT 163).    Goal of Therapy:  Heparin  level 0.3-0.7 units/ml aPTT 66-102 seconds Monitor platelets by anticoagulation protocol: Yes   Plan:  Continue heparin  1850 units/h  Daily heparin  level and CBC  Morna Breach, PharmD PGY2 Cardiology Pharmacy Resident 01/07/2024 12:55 PM

## 2024-01-07 NOTE — Progress Notes (Signed)
 Advanced Heart Failure Rounding Note   Subjective:    Swan removed yesterday   Feels good. No CP or SOB  Good diuresis with IV lasix  yesterday     Objective:   Weight Range:  Vital Signs:   Temp:  [97.9 F (36.6 C)-98.7 F (37.1 C)] 98.6 F (37 C) (10/26 1125) Pulse Rate:  [37-76] 75 (10/26 1200) Resp:  [10-27] 16 (10/26 1200) BP: (89-138)/(62-113) 133/88 (10/26 1200) SpO2:  [91 %-100 %] 99 % (10/26 1200) Weight:  [146.7 kg] 146.7 kg (10/26 0500) Last BM Date : 01/06/24  Weight change: Filed Weights   01/05/24 1028 01/06/24 0458 01/07/24 0500  Weight: (!) 147 kg (!) 147.6 kg (!) 146.7 kg    Intake/Output:   Intake/Output Summary (Last 24 hours) at 01/07/2024 1257 Last data filed at 01/07/2024 1200 Gross per 24 hour  Intake 2009.59 ml  Output 1625 ml  Net 384.59 ml     Physical Exam: General:  Sitting up in chair  No resp difficulty HEENT: normal Neck: supple. no JVD.  Cor: Regular rate & rhythm. No rubs, gallops or murmurs. Lungs: clear Abdomen: soft, nontender, nondistended.Good bowel sounds. Extremities: no cyanosis, clubbing, rash, edema Neuro: alert & orientedx3, cranial nerves grossly intact. moves all 4 extremities w/o difficulty. Affect pleasant   Telemetry: Sinus 70s  Personally reviewed   Labs: Basic Metabolic Panel: Recent Labs  Lab 01/05/24 1326 01/05/24 1907 01/05/24 1907 01/06/24 0339 01/06/24 1834 01/07/24 0308  NA 141  145 139  --  134* 136 136  K 3.3*  2.7* 3.4*  --  3.1* 4.1 3.9  CL  --  106  --  101 104 105  CO2  --  24  --  23 22 21*  GLUCOSE  --  96  --  102* 114* 103*  BUN  --  18  --  17 15 14   CREATININE  --  1.23  --  1.32* 1.28* 1.16  CALCIUM  --  8.8*   < > 8.8* 8.7* 8.8*  MG  --  1.9  --  1.9  --  2.2   < > = values in this interval not displayed.    Liver Function Tests: Recent Labs  Lab 01/05/24 1907  AST 25  ALT 30  ALKPHOS 62  BILITOT 0.9  PROT 6.1*  ALBUMIN 3.3*   No results for  input(s): LIPASE, AMYLASE in the last 168 hours. No results for input(s): AMMONIA in the last 168 hours.  CBC: Recent Labs  Lab 01/05/24 1103 01/05/24 1326 01/05/24 1907 01/06/24 0339 01/07/24 0308  WBC 7.1  --  7.6 7.8 8.5  NEUTROABS  --   --  3.1  --   --   HGB 15.1 15.0  13.3 13.7 13.8 13.5  HCT 45.6 44.0  39.0 43.6 43.0 42.2  MCV 78.6*  --  80.3 79.9* 78.9*  PLT 186  --  165 158 163    Cardiac Enzymes: No results for input(s): CKTOTAL, CKMB, CKMBINDEX, TROPONINI in the last 168 hours.  BNP: BNP (last 3 results) Recent Labs    11/24/23 1618 12/25/23 1139 01/05/24 1907  BNP 1,700.4* 315.6* 674.8*    ProBNP (last 3 results) No results for input(s): PROBNP in the last 8760 hours.    Other results:  Imaging: CARDIAC CATHETERIZATION Result Date: 01/05/2024 Findings: RA = 2 RV = 22/7 PA = 29/18 (21) PCW = 5 Fick cardiac output/index = 5.1/1.9 Thermo CO/CI = 5.9/2.2 PVR = 3.1 (  Fick) TD (2.7) Ao sat = 96% PA sat = 58*, 66% PAPi = 5.5 Assessment: 1. Low filling pressures with moderately reduce CO Plan/Discussion: Plan VAD next week. Phyliss Hulick, MD 2:19 PM    Medications:     Scheduled Medications:  allopurinol   100 mg Oral Daily   Chlorhexidine  Gluconate Cloth  6 each Topical Daily   colchicine   0.6 mg Oral BID   digoxin   0.125 mg Oral Daily   ezetimibe   10 mg Oral QHS   HYDROcodone -acetaminophen   1 tablet Oral Once   mexiletine  300 mg Oral BID   multivitamin with minerals  1 tablet Oral Daily   sodium chloride  flush  3 mL Intravenous Q12H   spironolactone   25 mg Oral Daily    Infusions:  heparin  1,850 Units/hr (01/07/24 1200)    PRN Medications: acetaminophen , albuterol , ondansetron  (ZOFRAN ) IV, sodium chloride  flush   Plan/Discussion:    1. Chronic systolic heart failure, likely combined ischemic and nonischemic: -Mentions of compaction CM from [facility] cards notes.  PVCs may also contribute, 25.3% PVCs with NSVT on Zio monitor  in 5/25 -Echo 9/25 with EF < 20%, severe LV dilation, moderate RV enlargement with low normal systolic function, moderate MR, dilated IVC.   Re-admitted in 9/25.  Had been out of most medications. RHC markedly elevated filling pressures with low CI (1.6 Fick/1.77 thermo). Cardiac MRI LV EF 14%, RV EF 29%, suspect mixed ischemic/nonischemic cardiomyopathy with scarring process due to MI in the mid inferior/inferolateral wall and non-coronary type mid-wall LGE in the basal septum.  He was started on milrinone  and diuresed, then milrinone  weaned off.   - RHC 01/05/24 with low filling pressures and reduced CI (1.9 TD, 2.2 Fick). - NYHA class IV symptoms - Volume ok. Restart home torsemide  - Hold entresto  and jardiance  preop - Continue digoxin  - spironolactone  25 mg daily.  - Off Toprol  XL with low output.  - With weight and smoking (quit < 1 month), not a candidate for heart transplant.   - Plan for dental extractions tomorrow and VAD implant 10/30.     2. CAD:  LHC 5/24 at [facility] showed severe multivessel CAD including 80% mid RCA, 100% RPDA, 100% mid LCx, 80% OM1, 80% mid-distal LAD. Medical management recommended by [facility] cardiology 05/24.  Repeat coronary angiography in 9/25 showed occluded PDA and mid AV LCx, 90% ramus, severe diffuse disease in right PLV, nonobstructive LAD disease.  With markedly low EF and without severe LAD disease, CABG nor a durable option.  - No ASA given anticoagulation use and stable CAD.  - Continue Zetia , has not been able to tolerate statin.  Refer to lipid clinic for Repatha as outpatient. Lp(a) is very elevated.  - Continue statin - No s/s angina   3. PVCs: Zio 5/25 with 25.3%, NSVT noted burden.  He did not tolerate amiodarone  well and was started on mexiletine.  - Follow on tele - Continue mexilitene   - Will need sleep study.    4. Morbid obesity:  - Body mass index is 40.42 kg/m. - consider GLP1RA in the future .  5. Tobacco use:  - Has quit < 52month .   6. Anticoagulation: Possible LV thrombus by echo in the past. No LV thrombus on cardiac MRI 9/25.  -  Off Eliquis .On heparin . No bleeding  7. Gout: Very symptomatic, knees and ankles.   - Continue allopurinol  and colchicine   8. Hypokalemia - K 3.9 supp   Can got to [facility]  Length of Stay: 2   Toribio Fuel MD 01/07/2024, 12:57 PM  Advanced Heart Failure Team Pager [phone] (M-F; 7a - 4p)  Please contact [facility] for night-coverage after hours (4p -7a ) and weekends on [email or web address]

## 2024-01-08 ENCOUNTER — Encounter (HOSPITAL_COMMUNITY): Admission: AD | Disposition: A | Discharge status: Home / Self Care | Payer: Self-pay | Source: Home / Self Care

## 2024-01-08 ENCOUNTER — Encounter (HOSPITAL_COMMUNITY): Payer: Self-pay | Admitting: Internal Medicine

## 2024-01-08 ENCOUNTER — Ambulatory Visit (HOSPITAL_COMMUNITY): Admission: RE | Admit: 2024-01-08 | Source: Home / Self Care | Admitting: Oral Surgery

## 2024-01-08 ENCOUNTER — Inpatient Hospital Stay (HOSPITAL_COMMUNITY): Payer: Self-pay | Admitting: Vascular Surgery

## 2024-01-08 ENCOUNTER — Other Ambulatory Visit: Payer: Self-pay

## 2024-01-08 DIAGNOSIS — Z7189 Other specified counseling: Secondary | ICD-10-CM | POA: Diagnosis not present

## 2024-01-08 DIAGNOSIS — I5043 Acute on chronic combined systolic (congestive) and diastolic (congestive) heart failure: Secondary | ICD-10-CM | POA: Diagnosis not present

## 2024-01-08 DIAGNOSIS — I5022 Chronic systolic (congestive) heart failure: Secondary | ICD-10-CM | POA: Diagnosis not present

## 2024-01-08 DIAGNOSIS — I2510 Atherosclerotic heart disease of native coronary artery without angina pectoris: Secondary | ICD-10-CM

## 2024-01-08 DIAGNOSIS — K029 Dental caries, unspecified: Secondary | ICD-10-CM | POA: Diagnosis not present

## 2024-01-08 DIAGNOSIS — I10 Essential (primary) hypertension: Secondary | ICD-10-CM

## 2024-01-08 DIAGNOSIS — Z87891 Personal history of nicotine dependence: Secondary | ICD-10-CM | POA: Diagnosis not present

## 2024-01-08 DIAGNOSIS — Z515 Encounter for palliative care: Secondary | ICD-10-CM | POA: Diagnosis not present

## 2024-01-08 HISTORY — PX: TOOTH EXTRACTION: SHX859

## 2024-01-08 HISTORY — DX: Type 2 diabetes mellitus without complications: E11.9

## 2024-01-08 HISTORY — DX: Unspecified asthma, uncomplicated: J45.909

## 2024-01-08 HISTORY — DX: Dyspnea, unspecified: R06.00

## 2024-01-08 LAB — CBC
HCT: 42.7 % (ref 39.0–52.0)
Hemoglobin: 13.6 g/dL (ref 13.0–17.0)
MCH: 25.4 pg — ABNORMAL LOW (ref 26.0–34.0)
MCHC: 31.9 g/dL (ref 30.0–36.0)
MCV: 79.7 fL — ABNORMAL LOW (ref 80.0–100.0)
Platelets: 173 K/uL (ref 150–400)
RBC: 5.36 MIL/uL (ref 4.22–5.81)
RDW: 20.4 % — ABNORMAL HIGH (ref 11.5–15.5)
WBC: 9.5 K/uL (ref 4.0–10.5)
nRBC: 0 % (ref 0.0–0.2)

## 2024-01-08 LAB — BASIC METABOLIC PANEL WITH GFR
Anion gap: 11 (ref 5–15)
BUN: 14 mg/dL (ref 6–20)
CO2: 22 mmol/L (ref 22–32)
Calcium: 8.7 mg/dL — ABNORMAL LOW (ref 8.9–10.3)
Chloride: 101 mmol/L (ref 98–111)
Creatinine, Ser: 1.39 mg/dL — ABNORMAL HIGH (ref 0.61–1.24)
GFR, Estimated: >60 mL/min (ref >60)
Glucose, Bld: 96 mg/dL (ref 70–99)
Potassium: 3.9 mmol/L (ref 3.5–5.1)
Sodium: 134 mmol/L — ABNORMAL LOW (ref 135–145)

## 2024-01-08 LAB — HEPARIN LEVEL (UNFRACTIONATED): Heparin Unfractionated: 0.52 [IU]/mL (ref 0.30–0.70)

## 2024-01-08 LAB — MAGNESIUM: Magnesium: 1.6 mg/dL — ABNORMAL LOW (ref 1.7–2.4)

## 2024-01-08 LAB — GLUCOSE, CAPILLARY: Glucose-Capillary: 155 mg/dL — ABNORMAL HIGH (ref 70–99)

## 2024-01-08 SURGERY — DENTAL RESTORATION/EXTRACTIONS
Anesthesia: General

## 2024-01-08 MED ORDER — LIDOCAINE-EPINEPHRINE 2 %-1:100000 IJ SOLN
INTRAMUSCULAR | Status: DC | PRN
Start: 2024-01-08 — End: 2024-01-08
  Administered 2024-01-08: 13 mL via INTRADERMAL

## 2024-01-08 MED ORDER — LIDOCAINE-EPINEPHRINE 2 %-1:100000 IJ SOLN
INTRAMUSCULAR | Status: AC
  Filled 2024-01-08: qty 1

## 2024-01-08 MED ORDER — LACTATED RINGERS IV SOLN: INTRAVENOUS | Status: DC

## 2024-01-08 MED ORDER — OXYMETAZOLINE HCL 0.05 % NA SOLN
NASAL | Status: AC
  Filled 2024-01-08: qty 30

## 2024-01-08 MED ORDER — DEXAMETHASONE SOD PHOSPHATE PF 10 MG/ML IJ SOLN
INTRAMUSCULAR | Status: DC | PRN
Start: 2024-01-08 — End: 2024-01-08
  Administered 2024-01-08: 10 mg via INTRAVENOUS

## 2024-01-08 MED ORDER — OXYCODONE-ACETAMINOPHEN 5-325 MG PO TABS
1.0000 | ORAL_TABLET | ORAL | Status: DC | PRN
  Administered 2024-01-08 – 2024-01-10 (×3): 2 via ORAL
  Filled 2024-01-08 (×3): qty 2

## 2024-01-08 MED ORDER — FENTANYL CITRATE (PF) 100 MCG/2ML IJ SOLN: 25.0000 ug | INTRAMUSCULAR | Status: DC | PRN

## 2024-01-08 MED ORDER — OXYCODONE HCL 5 MG PO TABS: 5.0000 mg | ORAL_TABLET | Freq: Once | ORAL | Status: DC | PRN

## 2024-01-08 MED ORDER — ONDANSETRON HCL 4 MG/2ML IJ SOLN: 4.0000 mg | Freq: Once | INTRAMUSCULAR | Status: DC | PRN

## 2024-01-08 MED ORDER — SUGAMMADEX SODIUM 200 MG/2ML IV SOLN
INTRAVENOUS | Status: DC | PRN
  Administered 2024-01-08: 400 mg via INTRAVENOUS

## 2024-01-08 MED ORDER — ONDANSETRON HCL 4 MG/2ML IJ SOLN
INTRAMUSCULAR | Status: AC
  Filled 2024-01-08: qty 2

## 2024-01-08 MED ORDER — CEFAZOLIN SODIUM-DEXTROSE 3-4 GM/150ML-% IV SOLN
3.0000 g | INTRAVENOUS | Status: AC
  Administered 2024-01-08: 3 g via INTRAVENOUS
  Filled 2024-01-08 (×2): qty 150

## 2024-01-08 MED ORDER — OXYMETAZOLINE HCL 0.05 % NA SOLN
NASAL | Status: AC
Start: 2024-01-08 — End: 2024-01-08
  Filled 2024-01-08: qty 30

## 2024-01-08 MED ORDER — FENTANYL CITRATE (PF) 250 MCG/5ML IJ SOLN
INTRAMUSCULAR | Status: DC | PRN
  Administered 2024-01-08 (×2): 50 ug via INTRAVENOUS

## 2024-01-08 MED ORDER — 0.9 % SODIUM CHLORIDE (POUR BTL) OPTIME
TOPICAL | Status: DC | PRN
  Administered 2024-01-08: 1000 mL

## 2024-01-08 MED ORDER — MIDAZOLAM HCL (PF) 2 MG/2ML IJ SOLN
INTRAMUSCULAR | Status: DC | PRN
  Administered 2024-01-08: 2 mg via INTRAVENOUS

## 2024-01-08 MED ORDER — ACETAMINOPHEN 10 MG/ML IV SOLN: 1000.0000 mg | Freq: Once | INTRAVENOUS | Status: DC | PRN

## 2024-01-08 MED ORDER — MIDAZOLAM HCL 2 MG/2ML IJ SOLN
INTRAMUSCULAR | Status: AC
  Filled 2024-01-08: qty 2

## 2024-01-08 MED ORDER — PHENYLEPHRINE 80 MCG/ML (10ML) SYRINGE FOR IV PUSH (FOR BLOOD PRESSURE SUPPORT)
PREFILLED_SYRINGE | INTRAVENOUS | Status: DC | PRN
  Administered 2024-01-08: 80 ug via INTRAVENOUS

## 2024-01-08 MED ORDER — MAGNESIUM SULFATE 4 GM/100ML IV SOLN
4.0000 g | Freq: Once | INTRAVENOUS | Status: AC
  Administered 2024-01-08: 4 g via INTRAVENOUS
  Filled 2024-01-08: qty 100

## 2024-01-08 MED ORDER — HEMOSTATIC AGENTS (NO CHARGE) OPTIME
TOPICAL | Status: DC | PRN
  Administered 2024-01-08 (×2): 1 via TOPICAL

## 2024-01-08 MED ORDER — OXYCODONE HCL 5 MG/5ML PO SOLN: 5.0000 mg | Freq: Once | ORAL | Status: DC | PRN

## 2024-01-08 MED ORDER — CHLORHEXIDINE GLUCONATE 0.12 % MT SOLN
15.0000 mL | Freq: Once | OROMUCOSAL | Status: AC
  Filled 2024-01-08: qty 15

## 2024-01-08 MED ORDER — SODIUM CHLORIDE 0.9 % IR SOLN
Status: DC | PRN
  Administered 2024-01-08: 250 mL

## 2024-01-08 MED ORDER — ROCURONIUM BROMIDE 10 MG/ML (PF) SYRINGE
PREFILLED_SYRINGE | INTRAVENOUS | Status: DC | PRN
  Administered 2024-01-08: 100 mg via INTRAVENOUS

## 2024-01-08 MED ORDER — LIDOCAINE 2% (20 MG/ML) 5 ML SYRINGE
INTRAMUSCULAR | Status: DC | PRN
  Administered 2024-01-08: 100 mg via INTRAVENOUS

## 2024-01-08 MED ORDER — OXYMETAZOLINE HCL 0.05 % NA SOLN
NASAL | Status: DC | PRN
  Administered 2024-01-08 (×2): 2 via NASAL

## 2024-01-08 MED ORDER — PROPOFOL 10 MG/ML IV BOLUS
INTRAVENOUS | Status: AC
  Filled 2024-01-08: qty 20

## 2024-01-08 MED ORDER — FENTANYL CITRATE (PF) 100 MCG/2ML IJ SOLN
INTRAMUSCULAR | Status: AC
  Filled 2024-01-08: qty 2

## 2024-01-08 MED ORDER — PROPOFOL 10 MG/ML IV BOLUS
INTRAVENOUS | Status: DC | PRN
  Administered 2024-01-08: 30 mg via INTRAVENOUS
  Administered 2024-01-08: 40 mg via INTRAVENOUS
  Administered 2024-01-08: 20 mg via INTRAVENOUS

## 2024-01-08 MED ORDER — LIDOCAINE 2% (20 MG/ML) 5 ML SYRINGE
INTRAMUSCULAR | Status: AC
  Filled 2024-01-08: qty 5

## 2024-01-08 MED ORDER — ORAL CARE MOUTH RINSE: 15.0000 mL | Freq: Once | OROMUCOSAL | Status: AC

## 2024-01-08 MED ORDER — HEPARIN (PORCINE) 25000 UT/250ML-% IV SOLN
1850.0000 [IU]/h | INTRAVENOUS | Status: DC
  Administered 2024-01-09 – 2024-01-10 (×3): 1850 [IU]/h via INTRAVENOUS
  Filled 2024-01-08 (×3): qty 250

## 2024-01-08 MED ORDER — ROCURONIUM BROMIDE 10 MG/ML (PF) SYRINGE
PREFILLED_SYRINGE | INTRAVENOUS | Status: AC
  Filled 2024-01-08: qty 10

## 2024-01-08 MED ORDER — CHLORHEXIDINE GLUCONATE 0.12 % MT SOLN
OROMUCOSAL | Status: AC
  Filled 2024-01-08: qty 15

## 2024-01-08 SURGICAL SUPPLY — 27 items
BAG COUNTER SPONGE SURGICOUNT (BAG) IMPLANT
BLADE SURG 15 STRL LF DISP TIS (BLADE) ×1 IMPLANT
BUR CROSS CUT FISSURE 1.6 (BURR) ×1 IMPLANT
BUR EGG ELITE 4.0 (BURR) IMPLANT
CANISTER SUCTION 3000ML PPV (SUCTIONS) ×1 IMPLANT
COVER SURGICAL LIGHT HANDLE (MISCELLANEOUS) ×1 IMPLANT
GAUZE PACKING FOLDED 2 STR (GAUZE/BANDAGES/DRESSINGS) ×1 IMPLANT
GLOVE BIO SURGEON STRL SZ8 (GLOVE) ×1 IMPLANT
GOWN STRL REUS W/ TWL LRG LVL3 (GOWN DISPOSABLE) ×1 IMPLANT
GOWN STRL REUS W/ TWL XL LVL3 (GOWN DISPOSABLE) ×1 IMPLANT
IV 0.9% NACL 1000 ML (IV SOLUTION) ×1 IMPLANT
KIT BASIN OR (CUSTOM PROCEDURE TRAY) ×1 IMPLANT
KIT TURNOVER KIT B (KITS) ×1 IMPLANT
NDL HYPO 25GX1X1/2 BEV (NEEDLE) ×2 IMPLANT
NEEDLE HYPO 25GX1X1/2 BEV (NEEDLE) ×2 IMPLANT
PAD ARMBOARD POSITIONER FOAM (MISCELLANEOUS) ×1 IMPLANT
SLEEVE IRRIGATION ELITE 7 (MISCELLANEOUS) ×1 IMPLANT
SOLN 0.9% NACL POUR BTL 1000ML (IV SOLUTION) ×1 IMPLANT
SPIKE FLUID TRANSFER (MISCELLANEOUS) IMPLANT
SPONGE SURGIFOAM ABS GEL 12-7 (HEMOSTASIS) IMPLANT
SUT CHROMIC 3 0 SH 27 (SUTURE) IMPLANT
SUT CHROMIC 4 0 RB 1X27 (SUTURE) ×1 IMPLANT
SYR BULB IRRIG 60ML STRL (SYRINGE) ×1 IMPLANT
SYR CONTROL 10ML LL (SYRINGE) ×1 IMPLANT
TRAY ENT MC OR (CUSTOM PROCEDURE TRAY) ×1 IMPLANT
TUBING IRRIGATION (MISCELLANEOUS) ×1 IMPLANT
YANKAUER SUCT BULB TIP NO VENT (SUCTIONS) ×1 IMPLANT

## 2024-01-08 NOTE — Progress Notes (Signed)
 H&V Care Navigation CSW Progress Note  Outpatient Heart Failure CSW met with pt to check in- daughter Darryle at bedside.  Patient in good humor during meeting but does report being scared for surgery on Thursday.  Struggling to talk to his young 47yo son regarding his health and possibility of his death.  Provided support around this concern and able to provide him LVAD information packet obtained by LVAD Coordinator that is given to young children getting LVAD to assist him in explaining his equipment and what restrictions he will have following surgery.  CSW also reached out to [facility] rep to get resources provided to children in their Kids Path program when they have family members with serious life threatening illnesses- will provide to patient once received.  CSW continues to be available to support patient during this implant stay and will support as need post surgery.  Andriette HILARIO Leech, LCSW Clinical Social Worker Advanced Heart Failure Clinic Desk#: [phone] Cell#: [phone]

## 2024-01-08 NOTE — Progress Notes (Signed)
 Clinical packet faxed to [facility] [employer] requesting prior authorization for upcoming VAD implant scheduled 01/11/24.  Member ID: [id number]  [employer]: [id number]      Fax: [phone]

## 2024-01-08 NOTE — Anesthesia Procedure Notes (Signed)
 Procedure Name: Intubation Date/Time: 01/08/2024 11:50 AM  Performed by: Jolynn Mage, CRNAPre-anesthesia Checklist: Patient identified, Emergency Drugs available, Suction available and Patient being monitored Patient Re-evaluated:Patient Re-evaluated prior to induction Oxygen Delivery Method: Circle system utilized Preoxygenation: Pre-oxygenation with 100% oxygen Induction Type: IV induction Ventilation: Mask ventilation without difficulty Laryngoscope Size: Mac, Glidescope and 4 Grade View: Grade I Nasal Tubes: Nasal prep performed, Nasal Rae and Right Tube size: 7.5 mm Number of attempts: 1 Airway Equipment and Method: Video-laryngoscopy Placement Confirmation: ETT inserted through vocal cords under direct vision, positive ETCO2 and breath sounds checked- equal and bilateral Tube secured with: Tape Dental Injury: Teeth and Oropharynx as per pre-operative assessment  Difficulty Due To: Difficult Airway- due to limited oral opening and Difficult Airway- due to large tongue

## 2024-01-08 NOTE — Transfer of Care (Signed)
 Immediate Anesthesia Transfer of Care Note  [name]  Procedure(s) Performed: DENTAL RESTORATION/EXTRACTIONS  Patient Location: PACU  Anesthesia Type:General  Level of Consciousness: awake, alert , and oriented  Airway & Oxygen Therapy: Patient Spontanous Breathing  Post-op Assessment: Report given to RN, Post -op Vital signs reviewed and stable, and Patient moving all extremities X 4  Post vital signs: Reviewed and stable  Last Vitals:  Vitals Value Taken Time  BP 155/109 01/08/24 12:48  Temp    Pulse 108 01/08/24 12:50  Resp 16 01/08/24 12:50  SpO2 95 % 01/08/24 12:50  Vitals shown include unfiled device data.  Last Pain:  Vitals:   01/08/24 0707  TempSrc:   PainSc: 0-No pain         Complications: No notable events documented.

## 2024-01-08 NOTE — Progress Notes (Signed)
 Daily Progress Note   Patient Name: Philip Monroe       Date: 01/08/2024 DOB: 1975/12/09  Age: 48 y.o. MRN#: [id number] Attending Physician: Philip Toribio SAUNDERS, MD Primary Care Physician: Pcp, No Admit Date: 01/05/2024  Reason for Consultation/Follow-up: Other: LVAD   Length of Stay: 3  Current Medications: Scheduled Meds:   allopurinol   100 mg Oral Daily   colchicine   0.6 mg Oral BID   digoxin   0.125 mg Oral Daily   ezetimibe   10 mg Oral QHS   HYDROcodone -acetaminophen   1 tablet Oral Once   mexiletine  300 mg Oral BID   multivitamin with minerals  1 tablet Oral Daily   sodium chloride  flush  3 mL Intravenous Q12H   spironolactone   25 mg Oral Daily   torsemide   40 mg Oral Daily    Continuous Infusions:  [START ON 01/09/2024] heparin      magnesium sulfate bolus IVPB      PRN Meds: acetaminophen , albuterol , ondansetron  (ZOFRAN ) IV, oxyCODONE -acetaminophen , sodium chloride  flush  Physical Exam Vitals reviewed.  Constitutional:      General: He is not in acute distress. HENT:     Head: Normocephalic and atraumatic.     Comments: icepack Cardiovascular:     Rate and Rhythm: Normal rate.  Pulmonary:     Effort: Pulmonary effort is normal.  Skin:    General: Skin is warm and dry.  Neurological:     Mental Status: He is alert and oriented to person, place, and time.  Psychiatric:        Mood and Affect: Mood normal.        Behavior: Behavior normal.        Thought Content: Thought content normal.        Judgment: Judgment normal.             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   Acute exacerbation of CHF (congestive heart failure) (HCC) 11/24/2023   Effusion of right knee 09/25/2023   Ischemic cardiomyopathy 05/21/2023   Demand ischemia (HCC) 05/20/2023   Elevated troponin 05/20/2023   CKD (chronic kidney disease), stage II    Hyperlipidemia LDL goal <70    Morbid obesity (HCC)    PVC's (premature ventricular contractions)    NSTEMI (non-ST elevated myocardial infarction) (HCC) 05/19/2023   Acute on chronic combined systolic and diastolic CHF (congestive heart failure) (HCC) 05/19/2023   Tobacco use disorder 07/19/2022   Coronary artery disease involving native coronary artery of native heart without angina pectoris 09/10/2018   Non-ischemic cardiomyopathy (HCC) 08/23/2017   Chronic systolic heart failure (HCC) 04/20/2015   Hypertension 04/20/2015   Alcohol use 04/20/2015   Acute on chronic congestive heart failure (HCC) 03/31/2015    Palliative Care Assessment & Plan   Patient Profile: 48 y.o. male  with past medical history of multivessel CAD, chronic HFrEF, noncompaction CM, HTN, and previous ETOH use admitted on 01/05/2024 after RHC for HF optimization, dental extraction (10/27) and HM III VAD implantation on 01/11/2024.   Today's Discussion: Reviewed chart. Patient had dental extraction this afternoon. Plan is for LVAD 10/30.  Patient sitting up in bed in NAD. He has ice pack around jaw and reports nurse just gave him meds for pain at dental extraction site. When asked how he feels about the upcoming LVAD surgery he responds scared. We discussed it is normal to have worry before a big procedure. We discussed plan for spiritual care to come and assist with HCPOA document. He has discussed this with his daughter Philip Monroe who he plans to name as  proxy.  Patient is glad that the VAD SW was able to get him resources to assist him in talking to his youngest son about the procedure and what equipment he will have after surgery. He did share with his son that he was having a surgery this week and was having a pump placed in his heart. He shared stories about his youngest son and their visit yesterday. He has been glad to have support from several visitors over the weekend. We spent some time discussing available support for him during this hospitalization including PMT and spiritual care. He is open to having our chaplain see him for support during the hospitalization.  Encouraged patient to call PMT with questions or concerns. PMT will continue to follow.  Recommendations/Plan: LVAD planned 10/30 Spiritual care for HCPOA document and support PMT will follow    Code Status:    Code Status Orders  (From admission, onward)           Start     Ordered   01/05/24 1446  Full code  Continuous       Question:  By:  Answer:  Procedural case: previous code status reviewed   01/05/24 1453         Extensive chart review has been completed prior to seeing the patient including labs, vital signs, imaging, progress/consult notes, orders, medications, and available advance directive documents.  Care plan was discussed with Philip Monroe SW and Philip Monroe  Time spent: 50 minutes  Thank you for allowing the Palliative Medicine Team to assist in the care of this patient.   Philip CHRISTELLA Palin, NP  Please contact Palliative Medicine Team phone at [phone] for questions and concerns.

## 2024-01-08 NOTE — Op Note (Signed)
 01/08/2024  12:32 PM  PATIENT:  Philip Monroe  48 y.o. male  PRE-OPERATIVE DIAGNOSIS:  NON-RESTORABLE TEETH # 4, 5, 17, 30 SECONDARY TO  DENTAL CARIES.  POST-OPERATIVE DIAGNOSIS:  SAME  PROCEDURE:  Procedure(s): EXTRACTION TEETH # 4, 5, 17, 30  SURGEON:  Surgeon(s): Sheryle Hamilton, DMD  ANESTHESIA:   local and general  EBL:  minimal  DRAINS: none   SPECIMEN:  No Specimen  COUNTS:  YES  PLAN OF CARE: TO 2HEART after PACU  PATIENT DISPOSITION:  PACU - hemodynamically stable.   PROCEDURE DETAILS: Dictation #[id number]  Hamilton EMERSON Sheryle, DMD 01/08/2024 12:32 PM

## 2024-01-08 NOTE — Anesthesia Procedure Notes (Signed)
 Arterial Line Insertion Performed by: Vertie Russell NOVAK, CRNA, CRNA  Preanesthetic checklist: patient identified, IV checked, site marked, risks and benefits discussed, surgical consent, monitors and equipment checked, pre-op evaluation, timeout performed and anesthesia consent Lidocaine  1% used for infiltration Right, radial was placed Catheter size: 20 G  Attempts: 1 Following insertion, dressing applied and Biopatch. Post procedure assessment: normal  Patient tolerated the procedure well with no immediate complications.

## 2024-01-08 NOTE — Progress Notes (Addendum)
 Advanced Heart Failure Rounding Note  Subjective:    S/p x4 teeth extractions by Dr. Sheryle. Seen in PACU. In great spirits. Denies CP/SOB.   Objective:   Weight Range:  Vital Signs:   Temp:  [97.6 F (36.4 C)-98.5 F (36.9 C)] 97.7 F (36.5 C) (10/27 1320) Pulse Rate:  [78-108] 92 (10/27 1320) Resp:  [9-20] 14 (10/27 1320) BP: (115-144)/(83-108) 134/87 (10/27 1320) SpO2:  [94 %-100 %] 96 % (10/27 1320) Arterial Line BP: (153-163)/(86-104) 153/86 (10/27 1300) Weight:  [147 kg-147.1 kg] 147 kg (10/27 0656) Last BM Date : 01/06/24  Weight change: Filed Weights   01/07/24 0500 01/08/24 0601 01/08/24 0656  Weight: (!) 146.7 kg (!) 147.1 kg (!) 147 kg    Intake/Output:   Intake/Output Summary (Last 24 hours) at 01/08/2024 1331 Last data filed at 01/08/2024 1230 Gross per 24 hour  Intake 445.34 ml  Output 215 ml  Net 230.34 ml     Physical Exam: General:  well appearing.  No respiratory difficulty Neck: JVD UTA, ice packs wrapped around head.   Cor: Regular rate & rhythm. No murmurs. Lungs: clear Extremities: no edema  Neuro: alert & oriented x 3. Affect pleasant.   Telemetry: Sinus 90s with PVCs  Personally reviewed   Labs: Basic Metabolic Panel: Recent Labs  Lab 01/05/24 1907 01/06/24 0339 01/06/24 1834 01/07/24 0308 01/08/24 0250  NA 139 134* 136 136 134*  K 3.4* 3.1* 4.1 3.9 3.9  CL 106 101 104 105 101  CO2 24 23 22  21* 22  GLUCOSE 96 102* 114* 103* 96  BUN 18 17 15 14 14   CREATININE 1.23 1.32* 1.28* 1.16 1.39*  CALCIUM 8.8* 8.8* 8.7* 8.8* 8.7*  MG 1.9 1.9  --  2.2 1.6*    Liver Function Tests: Recent Labs  Lab 01/05/24 1907  AST 25  ALT 30  ALKPHOS 62  BILITOT 0.9  PROT 6.1*  ALBUMIN 3.3*   No results for input(s): LIPASE, AMYLASE in the last 168 hours. No results for input(s): AMMONIA in the last 168 hours.  CBC: Recent Labs  Lab 01/05/24 1103 01/05/24 1326 01/05/24 1907 01/06/24 0339 01/07/24 0308 01/08/24 0250   WBC 7.1  --  7.6 7.8 8.5 9.5  NEUTROABS  --   --  3.1  --   --   --   HGB 15.1 15.0  13.3 13.7 13.8 13.5 13.6  HCT 45.6 44.0  39.0 43.6 43.0 42.2 42.7  MCV 78.6*  --  80.3 79.9* 78.9* 79.7*  PLT 186  --  165 158 163 173    Cardiac Enzymes: No results for input(s): CKTOTAL, CKMB, CKMBINDEX, TROPONINI in the last 168 hours.  BNP: BNP (last 3 results) Recent Labs    11/24/23 1618 12/25/23 1139 01/05/24 1907  BNP 1,700.4* 315.6* 674.8*    ProBNP (last 3 results) No results for input(s): PROBNP in the last 8760 hours.    Other results:  Imaging: No results found.   Medications:    Scheduled Medications:  [MAR Hold] allopurinol   100 mg Oral Daily   [MAR Hold] Chlorhexidine  Gluconate Cloth  6 each Topical Daily   [MAR Hold] colchicine   0.6 mg Oral BID   [MAR Hold] digoxin   0.125 mg Oral Daily   [MAR Hold] ezetimibe   10 mg Oral QHS   [MAR Hold] HYDROcodone -acetaminophen   1 tablet Oral Once   [MAR Hold] mexiletine  300 mg Oral BID   [MAR Hold] multivitamin with minerals  1 tablet Oral Daily   [  MAR Hold] sodium chloride  flush  3 mL Intravenous Q12H   [MAR Hold] spironolactone   25 mg Oral Daily   [MAR Hold] torsemide   40 mg Oral Daily    Infusions:  acetaminophen      heparin  Stopped (01/08/24 0642)   lactated ringers      PRN Medications: acetaminophen , [MAR Hold] acetaminophen , [MAR Hold] albuterol , fentaNYL  (SUBLIMAZE ) injection, [MAR Hold] ondansetron  (ZOFRAN ) IV, ondansetron  (ZOFRAN ) IV, oxyCODONE  **OR** oxyCODONE , [MAR Hold] sodium chloride  flush   Plan/Discussion:   1. Chronic systolic heart failure, likely combined ischemic and nonischemic: -Mentions of compaction CM from [facility] cards notes.  PVCs may also contribute, 25.3% PVCs with NSVT on Zio monitor in 5/25 -Echo 9/25 with EF < 20%, severe LV dilation, moderate RV enlargement with low normal systolic function, moderate MR, dilated IVC.   Re-admitted in 9/25.  Had been out of most medications.  RHC markedly elevated filling pressures with low CI (1.6 Fick/1.77 thermo). Cardiac MRI LV EF 14%, RV EF 29%, suspect mixed ischemic/nonischemic cardiomyopathy with scarring process due to MI in the mid inferior/inferolateral wall and non-coronary type mid-wall LGE in the basal septum.  He was started on milrinone  and diuresed, then milrinone  weaned off.   - RHC 01/05/24 with low filling pressures and reduced CI (1.9 TD, 2.2 Fick). - NYHA class IV symptoms on admission.  - Volume ok. Continue home torsemide  - Hold entresto  and jardiance  preop - Continue digoxin  - Continue spironolactone  25 mg daily.  - Off Toprol  XL with low output.  - With weight and smoking (quit < 1 month), not a candidate for heart transplant.   - Now s/p dental extractions 01/08/24. Plan for VAD implant 10/30.     2. CAD:  LHC 5/24 at [facility] showed severe multivessel CAD including 80% mid RCA, 100% RPDA, 100% mid LCx, 80% OM1, 80% mid-distal LAD. Medical management recommended by [facility] cardiology 05/24.  Repeat coronary angiography in 9/25 showed occluded PDA and mid AV LCx, 90% ramus, severe diffuse disease in right PLV, nonobstructive LAD disease.  With markedly low EF and without severe LAD disease, CABG nor a durable option.  - No ASA given anticoagulation use and stable CAD.  - Continue Zetia , has not been able to tolerate statin.  Refer to lipid clinic for Repatha as outpatient. Lp(a) is very elevated.  - Continue statin - No s/s angina   3. PVCs: Zio 5/25 with 25.3%, NSVT noted burden.  He did not tolerate amiodarone  well and was started on mexiletine.  - Follow on tele - Continue mexilitene   - Will need sleep study.    4. Morbid obesity:  - Body mass index is 40.5 kg/m. - consider GLP1RA in the future  5. Tobacco use:  - Has quit < 33month  6. Anticoagulation: Possible LV thrombus by echo in the past. No LV thrombus on cardiac MRI 9/25.  -  Off Eliquis .On heparin . No bleeding  7. Gout: Very symptomatic, knees  and ankles.   - Continue allopurinol  and colchicine   8. Hypokalemia - K 3.9 supp   Back to 2C post PACU  Length of Stay: 3   Alma LITTIE Coe AGACNP-BC  01/08/2024, 1:31 PM  Advanced Heart Failure Team Pager [phone] (M-F; 7a - 4p)  Please contact [facility] for night-coverage after hours (4p -7a ) and weekends on [email or web address]   Patient seen and examined with the above-signed Advanced Practice Provider and/or Housestaff. I personally reviewed laboratory data, imaging studies and relevant notes. I independently examined the  patient and formulated the important aspects of the plan. I have edited the note to reflect any of my changes or salient points. I have personally discussed the plan with the patient and/or family.  Doing well post teeth extraction. No CP or SOB. Mouth sore  General:  Sitting up in bed. No resp difficulty HEENT: normal x for ice packs Neck: supple. no JVD.  Cor: Regular rate & rhythm. No rubs, gallops or murmurs. Lungs: clear Abdomen: obese  soft, nontender, nondistended.Good bowel sounds. Extremities: no cyanosis, clubbing, rash, edema Neuro: alert & orientedx3, cranial nerves grossly intact. moves all 4 extremities w/o difficulty. Affect pleasant  Stable post teeth extraction. Continue to optimize for VAD on Thursday.   Toribio Fuel, MD  4:12 PM

## 2024-01-08 NOTE — Anesthesia Postprocedure Evaluation (Signed)
 Anesthesia Post Note  [name]  Procedure(s) Performed: DENTAL RESTORATION/EXTRACTIONS     Patient location during evaluation: PACU Anesthesia Type: General Level of consciousness: awake and alert Pain management: pain level controlled Vital Signs Assessment: post-procedure vital signs reviewed and stable Respiratory status: spontaneous breathing, nonlabored ventilation, respiratory function stable and patient connected to nasal cannula oxygen Cardiovascular status: blood pressure returned to baseline and stable Postop Assessment: no apparent nausea or vomiting Anesthetic complications: no   No notable events documented.  Last Vitals:  Vitals:   01/08/24 1315 01/08/24 1320  BP: (!) 144/98 134/87  Pulse: 91 92  Resp: 14 14  Temp:  36.5 C  SpO2: 96% 96%    Last Pain:  Vitals:   01/08/24 1250  TempSrc:   PainSc: 0-No pain                 [name]

## 2024-01-08 NOTE — Progress Notes (Signed)
 Surgery was postpone until 2 PM per Dr. Sheryle. Heparin  drip was turned off at 06:42 o'clock and per Dr. Sheryle, surgery was rescheduled after 6 hrs. RN on the floor was notified. Patient will be transported back to the floor.

## 2024-01-08 NOTE — TOC Initial Note (Signed)
 Transition of Care [facility]) - Initial/Assessment Note    Patient Details  Name: Philip Monroe MRN: [id number] Date of Birth: January 29, 1976  [facility] CM/SW Contact:    Justina Delcia Czar, RN Phone Number: [phone] 01/08/2024, 2:45 PM  Clinical Narrative:                  Pt lives at home with family, scheduled for LVAD placement on 10/30. Had teeth extractions today.   Will continue to follow for dc needs.    Expected Discharge Plan: IP Rehab Facility Barriers to Discharge: Continued Medical Work up   Patient Goals and CMS Choice      Expected Discharge Plan and Services   Discharge Planning Services: CM Consult   Living arrangements for the past 2 months: Apartment                   Prior Living Arrangements/Services Living arrangements for the past 2 months: Apartment Lives with:: Parents, Siblings     Activities of Daily Living   ADL Screening (condition at time of admission) Independently performs ADLs?: Yes (appropriate for developmental age) Is the patient deaf or have difficulty hearing?: No Does the patient have difficulty seeing, even when wearing glasses/contacts?: No Does the patient have difficulty concentrating, remembering, or making decisions?: No  Permission Sought/Granted   Emotional Assessment   Admission diagnosis:  CHF (congestive heart failure) (HCC) [I50.9] Patient Active Problem List   Diagnosis Date Noted   CHF (congestive heart failure) (HCC) 01/05/2024   Arthralgia of right knee 12/03/2023   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   Acute exacerbation of CHF (congestive heart failure) (HCC) 11/24/2023   Effusion of right knee 09/25/2023   Ischemic cardiomyopathy 05/21/2023   Demand ischemia (HCC) 05/20/2023   Elevated troponin 05/20/2023   CKD (chronic kidney disease), stage II    Hyperlipidemia LDL goal <70    Morbid obesity (HCC)    PVC's (premature ventricular contractions)    NSTEMI (non-ST elevated  myocardial infarction) (HCC) 05/19/2023   Acute on chronic combined systolic and diastolic CHF (congestive heart failure) (HCC) 05/19/2023   Tobacco use disorder 07/19/2022   Coronary artery disease involving native coronary artery of native heart without angina pectoris 09/10/2018   Non-ischemic cardiomyopathy (HCC) 08/23/2017   Chronic systolic heart failure (HCC) 04/20/2015   Hypertension 04/20/2015   Alcohol use 04/20/2015   Acute on chronic congestive heart failure (HCC) 03/31/2015   PCP:  Freddrick No Pharmacy:   [facility] [address]), [place] - [address] [place]) [place] [address] Phone: [phone] Fax: [phone]     Social Drivers of Health (SDOH) Social History: SDOH Screenings   Food Insecurity: No Food Insecurity (01/06/2024)  Housing: High Risk (01/07/2024)  Transportation Needs: Unmet Transportation Needs (01/07/2024)  Utilities: Not At Risk (01/07/2024)  Financial Resource Strain: Medium Risk (12/13/2023)  Tobacco Use: Medium Risk (01/08/2024)   SDOH Interventions:     Readmission Risk Interventions     No data to display

## 2024-01-08 NOTE — H&P (Signed)
 H&P documentation  -History and Physical Reviewed  -Patient has been re-examined  -No change in the plan of care  Philip Monroe

## 2024-01-08 NOTE — Progress Notes (Signed)
 PHARMACY - ANTICOAGULATION CONSULT NOTE  Pharmacy Consult for heparin  Indication: LV thrombus  Allergies  Allergen Reactions   Lactose Intolerance (Gi) Diarrhea   Statins     myalgias    Patient Measurements: Height: 6' 3 (190.5 cm) Weight: (!) 147 kg (324 lb) IBW/kg (Calculated) : 84.5 HEPARIN  DW (KG): 118  Vital Signs: Temp: 98.1 F (36.7 C) (10/27 1345) Temp Source: Oral (10/27 1345) BP: 130/97 (10/27 1345) Pulse Rate: 95 (10/27 1426)  Labs: Recent Labs    01/06/24 0128 01/06/24 0339 01/06/24 1005 01/06/24 1834 01/07/24 0308 01/08/24 0250  HGB  --  13.8  --   --  13.5 13.6  HCT  --  43.0  --   --  42.2 42.7  PLT  --  158  --   --  163 173  APTT 55*  --  110* 81*  --   --   HEPARINUNFRC 0.25*  --   --  0.45 0.53 0.52  CREATININE  --  1.32*  --  1.28* 1.16 1.39*    Estimated Creatinine Clearance: 101.8 mL/min (A) (by C-G formula based on SCr of 1.39 mg/dL (H)).  Assessment: 48 yoM admitted for RHC. Pt on apixaban  PTA for hx LV thrombus, now to start IV heparin  post/cath. LD apixaban  10/22.  Heparin  level this AM within goal range, no overt bleeding or complications noted.  Heparin  was turned off for tooth extractions this AM.  Goal of Therapy:  Heparin  level 0.3-0.7 units/ml aPTT 66-102 seconds Monitor platelets by anticoagulation protocol: Yes   Plan:  Per DDS, keep heparin  off until tomorrow at 1230 pm.  Harlene Barlow, Berdine BIRCH, BCPS, [facility] Clinical Pharmacist  01/08/2024 3:16 PM   [facility] pharmacy phone numbers are listed on [email or web address]

## 2024-01-09 ENCOUNTER — Encounter (HOSPITAL_COMMUNITY): Payer: Self-pay | Admitting: Oral Surgery

## 2024-01-09 DIAGNOSIS — I5022 Chronic systolic (congestive) heart failure: Secondary | ICD-10-CM | POA: Diagnosis not present

## 2024-01-09 LAB — CBC
HCT: 46.7 % (ref 39.0–52.0)
Hemoglobin: 14.8 g/dL (ref 13.0–17.0)
MCH: 25.2 pg — ABNORMAL LOW (ref 26.0–34.0)
MCHC: 31.7 g/dL (ref 30.0–36.0)
MCV: 79.6 fL — ABNORMAL LOW (ref 80.0–100.0)
Platelets: 202 K/uL (ref 150–400)
RBC: 5.87 MIL/uL — ABNORMAL HIGH (ref 4.22–5.81)
RDW: 20.9 % — ABNORMAL HIGH (ref 11.5–15.5)
WBC: 11.8 K/uL — ABNORMAL HIGH (ref 4.0–10.5)
nRBC: 0 % (ref 0.0–0.2)

## 2024-01-09 LAB — BASIC METABOLIC PANEL WITH GFR
Anion gap: 9 (ref 5–15)
Anion gap: 8 (ref 5–15)
BUN: 15 mg/dL (ref 6–20)
BUN: 17 mg/dL (ref 6–20)
CO2: 27 mmol/L (ref 22–32)
CO2: 27 mmol/L (ref 22–32)
Calcium: 9.7 mg/dL (ref 8.9–10.3)
Calcium: 9.7 mg/dL (ref 8.9–10.3)
Chloride: 97 mmol/L — ABNORMAL LOW (ref 98–111)
Chloride: 98 mmol/L (ref 98–111)
Creatinine, Ser: 1.51 mg/dL — ABNORMAL HIGH (ref 0.61–1.24)
Creatinine, Ser: 1.38 mg/dL — ABNORMAL HIGH (ref 0.61–1.24)
GFR, Estimated: 57 mL/min — ABNORMAL LOW (ref >60)
GFR, Estimated: >60 mL/min (ref >60)
Glucose, Bld: 123 mg/dL — ABNORMAL HIGH (ref 70–99)
Glucose, Bld: 110 mg/dL — ABNORMAL HIGH (ref 70–99)
Potassium: 5.7 mmol/L — ABNORMAL HIGH (ref 3.5–5.1)
Potassium: 5.2 mmol/L — ABNORMAL HIGH (ref 3.5–5.1)
Sodium: 133 mmol/L — ABNORMAL LOW (ref 135–145)
Sodium: 133 mmol/L — ABNORMAL LOW (ref 135–145)

## 2024-01-09 LAB — HEPARIN LEVEL (UNFRACTIONATED)
Heparin Unfractionated: <0.1 [IU]/mL — ABNORMAL LOW (ref 0.30–0.70)
Heparin Unfractionated: 0.33 [IU]/mL (ref 0.30–0.70)

## 2024-01-09 LAB — MAGNESIUM: Magnesium: 2.3 mg/dL (ref 1.7–2.4)

## 2024-01-09 NOTE — Progress Notes (Signed)
 PHARMACY - ANTICOAGULATION CONSULT NOTE  Pharmacy Consult for heparin  Indication: LV thrombus  Allergies  Allergen Reactions   Lactose Intolerance (Gi) Diarrhea   Statins     myalgias    Patient Measurements: Height: 6' 3 (190.5 cm) Weight: (!) 147 kg (324 lb) IBW/kg (Calculated) : 84.5 HEPARIN  DW (KG): 118  Vital Signs: Temp: 98 F (36.7 C) (10/28 0753) Temp Source: Oral (10/28 0753) BP: 128/85 (10/28 0753) Pulse Rate: 74 (10/28 0825)  Labs: Recent Labs    01/06/24 1834 01/06/24 1834 01/07/24 0308 01/08/24 0250 01/09/24 0222  HGB  --    < > 13.5 13.6 14.8  HCT  --   --  42.2 42.7 46.7  PLT  --   --  163 173 202  APTT 81*  --   --   --   --   HEPARINUNFRC 0.45  --  0.53 0.52 <0.10*  CREATININE 1.28*  --  1.16 1.39* 1.51*   < > = values in this interval not displayed.    Estimated Creatinine Clearance: 93.7 mL/min (A) (by C-G formula based on SCr of 1.51 mg/dL (H)).  Assessment: 84 yoM admitted for RHC. Pt on apixaban  PTA for hx LV thrombus, now to start IV heparin  post/cath. LD apixaban  10/22.  Heparin  level this AM was undetectable, as expected since heparin  was turned off for tooth extractions 10/27.  Ordered to resume at 1230 PM today.  No overt bleeding or complications noted.  Goal of Therapy:  Heparin  level 0.3-0.7 units/ml aPTT 66-102 seconds Monitor platelets by anticoagulation protocol: Yes   Plan:  Per DDS, keep heparin  off until today at 1230 pm, then will resume at 1850 units/hr. Check heparin  level 6 hrs after gtt resumes. Daily heparin  level and CBC.  Harlene Barlow, Berdine [employer], [facility] Clinical Pharmacist  01/09/2024 10:23 AM   [facility] pharmacy phone numbers are listed on [email or web address]

## 2024-01-09 NOTE — Progress Notes (Addendum)
 This chaplain responded to PMT NP-Dawn consult for supportive presence and creating the Pt. HCPOA.  The Pt. is sitting on the bedside recliner and accepting of the chaplain visit. The chaplain is building rapport with the Pt. through reflective listening during Advance Directive education. The chaplain understands the Pt. plans to name his daughter-Haley Uhde as HCPOA. The Pt. is not completing a Living Will.   The chaplain left the blank AD document with the Pt. The chaplain understands the Pt. will notify the chaplain through the RN when he is ready for a notary.  The chaplain provided education on the role of a hospital chaplain. During the time together, the Pt. identified he is scared. The chaplain affirmed the emotions-fears and assisted the Pt. in identifying the ways he copes with emotion and his hopes for himself and his family.   The Pt. complimented the resources from SW for adolescent LVAD education. The chaplain added an additional guide and talking points for helping children cope with serious illness.   [id number] The chaplain is present with the Pt. The Pt. is ready to page a notary and witnesses for the notarizing of the Pt. HCPOA. The chaplain is present with the Pt., notary, and witnesses. The chaplain gave the Pt. the original notarized Advance Directive along with one copy. The chaplain scanned the Pt. AD into the Pt. EMR.  This chaplain is available for F/U spiritual care as needed.  Chaplain Leeroy Hummer [phone]

## 2024-01-09 NOTE — Progress Notes (Addendum)
 Advanced Heart Failure Rounding Note  Chief Complaint: Pre-op VAD Subjective:    Denies resting CP. No dyspnea.   Early AM labs showed K 5.7, Scr 1.5. Repeat BMP in progress. Rhythm stable.   Only complaint today is increased pain/swelling rt lower gum line today. Tolerating thick pudding diet, asking if he can advance to regular diet.   Objective:   Weight Range:  Vital Signs:   Temp:  [97.7 F (36.5 C)-98.4 F (36.9 C)] 98 F (36.7 C) (10/28 0753) Pulse Rate:  [72-108] 74 (10/28 0825) Resp:  [9-20] 13 (10/28 0753) BP: (104-144)/(69-108) 128/85 (10/28 0753) SpO2:  [94 %-100 %] 96 % (10/28 0753) Arterial Line BP: (153-163)/(86-104) 153/86 (10/27 1300) Weight:  [147 kg] 147 kg (10/28 0437) Last BM Date : 01/06/24  Weight change: Filed Weights   01/08/24 0601 01/08/24 0656 01/09/24 0437  Weight: (!) 147.1 kg (!) 147 kg (!) 147 kg    Intake/Output:  Intake/Output Summary (Last 24 hours) at 01/09/2024 1129 Last data filed at 01/09/2024 0826 Gross per 24 hour  Intake 1188.61 ml  Output 15 ml  Net 1173.61 ml    Physical Exam  GENERAL: NAD Lungs- clear  CARDIAC:  JVP: not elevated          Normal rate with regular rhythm. No MRG, no LEE  ABDOMEN: Soft, non-tender, non-distended.  EXTREMITIES: Warm and well perfused.  NEUROLOGIC: No obvious FND   Telemetry: NSR 80s, occasional PVCs (personally reviewed)   Labs: Basic Metabolic Panel: Recent Labs  Lab 01/05/24 1907 01/06/24 0339 01/06/24 1834 01/07/24 0308 01/08/24 0250 01/09/24 0222  NA 139 134* 136 136 134* 133*  K 3.4* 3.1* 4.1 3.9 3.9 5.7*  CL 106 101 104 105 101 97*  CO2 24 23 22  21* 22 27  GLUCOSE 96 102* 114* 103* 96 123*  BUN 18 17 15 14 14 15   CREATININE 1.23 1.32* 1.28* 1.16 1.39* 1.51*  CALCIUM 8.8* 8.8* 8.7* 8.8* 8.7* 9.7  MG 1.9 1.9  --  2.2 1.6* 2.3    Liver Function Tests: Recent Labs  Lab 01/05/24 1907  AST 25  ALT 30  ALKPHOS 62  BILITOT 0.9  PROT 6.1*  ALBUMIN 3.3*    No results for input(s): LIPASE, AMYLASE in the last 168 hours. No results for input(s): AMMONIA in the last 168 hours.  CBC: Recent Labs  Lab 01/05/24 1907 01/06/24 0339 01/07/24 0308 01/08/24 0250 01/09/24 0222  WBC 7.6 7.8 8.5 9.5 11.8*  NEUTROABS 3.1  --   --   --   --   HGB 13.7 13.8 13.5 13.6 14.8  HCT 43.6 43.0 42.2 42.7 46.7  MCV 80.3 79.9* 78.9* 79.7* 79.6*  PLT 165 158 163 173 202   BNP (last 3 results) Recent Labs    11/24/23 1618 12/25/23 1139 01/05/24 1907  BNP 1,700.4* 315.6* 674.8*   Medications:    Scheduled Medications:  allopurinol   100 mg Oral Daily   colchicine   0.6 mg Oral BID   digoxin   0.125 mg Oral Daily   ezetimibe   10 mg Oral QHS   HYDROcodone -acetaminophen   1 tablet Oral Once   mexiletine  300 mg Oral BID   multivitamin with minerals  1 tablet Oral Daily   sodium chloride  flush  3 mL Intravenous Q12H   spironolactone   25 mg Oral Daily   torsemide   40 mg Oral Daily    Infusions:  heparin       PRN Medications: acetaminophen , albuterol , ondansetron  (ZOFRAN ) IV,  oxyCODONE -acetaminophen , sodium chloride  flush   Plan/Discussion:    1. 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  PVCs may also contribute, 25.3% PVCs with NSVT on Zio monitor in 5/25 -Echo 9/25 with EF < 20%, severe LV dilation, moderate RV enlargement with low normal systolic function, moderate MR, dilated IVC.   Re-admitted in 9/25.  Had been out of most medications. RHC markedly elevated filling pressures with low CI (1.6 Fick/1.77 thermo). Cardiac MRI LV EF 14%, RV EF 29%, suspect mixed ischemic/nonischemic cardiomyopathy with scarring process due to MI in the mid inferior/inferolateral wall and non-coronary type mid-wall LGE in the basal septum.  He was started on milrinone  and diuresed, then milrinone  weaned off.   - RHC 01/05/24 with low filling pressures and reduced CI (1.9 TD, 2.2  Fick). - NYHA class IV symptoms on admission.  - Volume ok. Continue Torsemide  40 mg daily  - Hold entresto  and jardiance  preop - Continue digoxin  0.125 mg daily  - On spironolactone  25 mg daily. May need to hold if hyperkalemic, F/u BMP pending  - Off Toprol  XL with low output.  - With weight and smoking (quit < 1 month), not a candidate for heart transplant.   - Now s/p dental extractions 01/08/24. Plan for VAD implant 10/30.     2. CAD:  LHC 5/24 at [facility] showed severe multivessel CAD including 80% mid RCA, 100% RPDA, 100% mid LCx, 80% OM1, 80% mid-distal LAD. Medical management recommended by [facility] cardiology 05/24.  Repeat coronary angiography in 9/25 showed occluded PDA and mid AV LCx, 90% ramus, severe diffuse disease in right PLV, nonobstructive LAD disease.  With markedly low EF and without severe LAD disease, CABG nor a durable option.  - No ASA given anticoagulation use and stable CAD.  - Continue Zetia , has not been able to tolerate statin.  Refer to lipid clinic for Repatha as outpatient. Lp(a) is very elevated.  - Continue statin - No s/s angina   3. PVCs: Zio 5/25 with 25.3%, NSVT noted burden.  He did not tolerate amiodarone  well and was started on mexiletine.  - Follow on tele - Continue mexilitene   - Will need outpatient sleep study.    4. Morbid obesity:  - Body mass index is 40.5 kg/m. - consider GLP1RA in the future  5. Tobacco use:  - Has quit < 40month  6. Anticoagulation: Possible LV thrombus by echo in the past. No LV thrombus on cardiac MRI 9/25.  -  Off Eliquis .On heparin . No bleeding  7. Gout: Very symptomatic, knees and ankles.   - Continue allopurinol  and colchicine   8. ? Hyperkalemia - early AM BMP showed K 5.7, ? Hemolyzed. Repeat BMP in progress   9. Dental Carries - s/p extractions - he is complaining of increased pain/swelling rt lower jaw, will ask Dr. Sheryle to reassess   Length of Stay: 4  Brittainy Marcine RIGGERS 01/09/2024, 11:29  AM  Advanced Heart Failure Team Pager [phone] (M-F; 7a - 4p)  Please contact [facility] for night-coverage after hours (4p -7a ) and weekends on [email or web address]   Patient seen and examined with the above-signed Advanced Practice Provider and/or Housestaff. I personally reviewed laboratory data, imaging studies and relevant notes. I independently examined the patient and formulated the important aspects of the plan. I have edited the note to reflect any of my changes or salient points. I have personally discussed the plan with the patient and/or family.  He is s/p teeth extraction. Doing well but having some R lower jaw pain. Oral surgery has seen.  Volume status looks good. Still with PVCs and some NSVT.   General:  Sitting up in chair. No resp difficulty HEENT: normal + peri-oral edema Neck: supple. no JVD.  Cor: Regular rate & rhythm. No rubs, gallops or murmurs. Lungs: clear Abdomen: soft, nontender, nondistended.Good bowel sounds. Extremities: no cyanosis, clubbing, rash, edema Neuro: alert & orientedx3, cranial nerves grossly intact. moves all 4 extremities w/o difficulty. Affect pleasant Continue post-op management. Appreciate Oral Surgery's support.   Plan VAD Thursday. Continue anticoagulation.   Toribio Fuel, MD  4:35 PM

## 2024-01-09 NOTE — Progress Notes (Signed)
 [name] L[facility] PROGRESS NOTE:   SUBJECTIVE: Swelling right cheek, lower jaw.   OBJECTIVE:  Vitals: Blood pressure (!) 134/95, pulse 78, temperature 97.9 F (36.6 C), temperature source Oral, resp. rate 13, height 6' 3 (1.905 m), weight (!) 147 kg, SpO2 100%. Lab results: Results for orders placed or performed during the hospital encounter of 01/05/24 (from the past 24 hours)  Basic metabolic panel     Status: Abnormal   Collection Time: 01/09/24  2:22 AM  Result Value Ref Range   Sodium 133 (L) 135 - 145 mmol/L   Potassium 5.7 (H) 3.5 - 5.1 mmol/L   Chloride 97 (L) 98 - 111 mmol/L   CO2 27 22 - 32 mmol/L   Glucose, Bld 123 (H) 70 - 99 mg/dL   BUN 15 6 - 20 mg/dL   Creatinine, Ser 8.48 (H) 0.61 - 1.24 mg/dL   Calcium 9.7 8.9 - 89.6 mg/dL   GFR, Estimated 57 (L) >60 mL/min   Anion gap 9 5 - 15  CBC     Status: Abnormal   Collection Time: 01/09/24  2:22 AM  Result Value Ref Range   WBC 11.8 (H) 4.0 - 10.5 K/uL   RBC 5.87 (H) 4.22 - 5.81 MIL/uL   Hemoglobin 14.8 13.0 - 17.0 g/dL   HCT 53.2 60.9 - 47.9 %   MCV 79.6 (L) 80.0 - 100.0 fL   MCH 25.2 (L) 26.0 - 34.0 pg   MCHC 31.7 30.0 - 36.0 g/dL   RDW 79.0 (H) 88.4 - 84.4 %   Platelets 202 150 - 400 K/uL   nRBC 0.0 0.0 - 0.2 %  Magnesium     Status: None   Collection Time: 01/09/24  2:22 AM  Result Value Ref Range   Magnesium 2.3 1.7 - 2.4 mg/dL  Heparin  level (unfractionated)     Status: Abnormal   Collection Time: 01/09/24  2:22 AM  Result Value Ref Range   Heparin  Unfractionated <0.10 (L) 0.30 - 0.70 IU/mL  Basic metabolic panel with GFR     Status: Abnormal   Collection Time: 01/09/24 10:39 AM  Result Value Ref Range   Sodium 133 (L) 135 - 145 mmol/L   Potassium 5.2 (H) 3.5 - 5.1 mmol/L   Chloride 98 98 - 111 mmol/L   CO2 27 22 - 32 mmol/L   Glucose, Bld 110 (H) 70 - 99 mg/dL   BUN 17 6 - 20 mg/dL   Creatinine, Ser 8.61 (H) 0.61 - 1.24 mg/dL   Calcium 9.7 8.9 - 89.6 mg/dL   GFR, Estimated >39 >39 mL/min   Anion  gap 8 5 - 15   Radiology Results: No results found. General appearance: alert, cooperative, and no distress Head: Normocephalic, without obvious abnormality, atraumatic Eyes: negative Nose: Nares normal. Septum midline. Mucosa normal. No drainage or sinus tenderness. Throat: Mild edema right cheek, Extraction sites hemostatic, sutures intact. NO purulence, fluctuance, trismus. Pharynx clear.   Neck: no adenopathy and supple, symmetrical, trachea midline  ASSESSMENT: Normal post-op edema.  PLAN: Ice to face off and on to help reduce swelling. OK to change to regular diet.    Glendia Primrose 01/09/2024

## 2024-01-09 NOTE — Progress Notes (Addendum)
 Nutrition Follow-up  DOCUMENTATION CODES:   Not applicable  INTERVENTION:  Liberalize diet to 2g sodium to maximize PO intake prior to surgery Provide double portion proteins MVI with minerals daily Follow up s/p surgery and modify plan of care as indicated by patient ability to consume sufficient calories/protein   NUTRITION DIAGNOSIS:  Increased nutrient needs related to chronic illness (CHF) as evidenced by estimated needs.   GOAL:  Patient will meet greater than or equal to 90% of their needs  MONITOR:  PO intake, Supplement acceptance  REASON FOR ASSESSMENT:  Consult Assessment of nutrition requirement/status, LVAD Eval  ASSESSMENT:  48 year old male with history of chronic HFrEF, CAD, PVCs, morbid obesity, tobacco use, possible prior LV thrombus, gout. Admitted for preVAD optimization.  Pt admitted with CHF and preVAD optimization.    10/24 - right heart cath revealed with low filling pressures and reduced Cl; swan left in for hemodynamic monitoring 10/27 - multiple teeth extracted 10/30 - LVAD placement scheduled   Met with patient today who was sitting up in chair at bedside. Endorses no pain. Does not like pudding thick liquid consistency ordered s/p surgery. Reached out to heart failure team. LVAD RN reaching out to oral surgeon to see if able to be advanced.  Diet returned to heart healthy s/p dental surgery. Will re-liberalize diet to 2g sodium to maximize PO intake.    Average Meal Intake 10/25: 75-100% x3 documented meals 10/26: 100% x2 documented meals 10/27: 100% x1 documented meal 10/28: 100% x1 documented meal   Noted with 100% of lunch meal completed with tray at bedside. Will order double portion proteins to augment intake as he is of larger body habitus. He is lactose intolerant. Updated patient profile in ordering system. No issues chewing or swallowing at baseline. Young adult daughter does most of the cooking and grocery shopping, however he is able  to cook as well.   24 Hour Recall B: 4 eggs, 4 turkey sausage links w/ water  L: chicken tenders and french fries  OR tuna salad OR leftovers w/ water  D: chicken or turkey (grilled or air fried), beans/corn/vegetable w/ water  Snacks: occasionally apple of banana  He reports baseline intake prior to admission. Hx of poor intake r/t inability to consume sufficient energy 2/2 fluid overload. He endorses significant weight loss in last year d/t shifts in fluid status.   Discussed need for adequate intake prior to surgery to optimize nutrition status as intake may be impacted after surgery. Discussed the likely need for ONS after surgery to augment intake. Will re-assess s/p procedure. He verbalized understanding.   Admit Weight: 147.6 kg Current Weight: 147 kg   He endorses significant weight loss in last year. Previously close to 400lbs. Now down to 323lbs.  Reviewed weight history; patient has experienced a 12.2% weight loss over the past 6 months, which is considered significant for the time frame reviewed. This is likely d/t diuresis and shifts in fluid status and, therefore, cannot be used as prognostic indicator for malnutrition criteria. Continues with some edema to BLEs which likely masking true weight loss as well as potential fat and muscle depletions. Bowels stable. Reports a bit sluggish since starting percocet. Skin intact.   Medications: MVI, torsemide , 4g Mg sulfate x1  Potassium high, likely r/t ongoing supplementation as patient was being diuresed. Diuretic changed to torsemide  today. Potassium no longer ordered. Has required magnesium supplementation as well.    Labs: Na+ 133 (L) K+ 5.2 (H) Crt 1.38 (H) WBC 11.8 (  H) CBGs 110-123 x24 hours A1c 6.6 (11/2023)   NUTRITION - FOCUSED PHYSICAL EXAM: Does not currently meet criteria for malnutrition. Recommend repeat NFPE at later time when edema has resolved. Although it is improved since admission, may be masking muscle and fat  depletions as he endorses some poor appetite in recent history d/t fluid overload.   Flowsheet Row Most Recent Value  Orbital Region No depletion  Upper Arm Region No depletion  Thoracic and Lumbar Region No depletion  Buccal Region No depletion  Temple Region No depletion  Clavicle Bone Region No depletion  Clavicle and Acromion Bone Region No depletion  Scapular Bone Region No depletion  Dorsal Hand No depletion  Patellar Region Unable to assess  Anterior Thigh Region Unable to assess  Posterior Calf Region Unable to assess  Edema (RD Assessment) Mild  Hair Reviewed  Eyes Reviewed  Mouth Reviewed  Skin Reviewed  Nails Reviewed    Diet Order:   Diet Order             Diet Heart Room service appropriate? Yes; Fluid consistency: Pudding Thick  Diet effective now            EDUCATION NEEDS:  No education needs have been identified at this time  Skin:  Skin Assessment: Reviewed RN Assessment  Last BM:  01/05/24  Height:  Ht Readings from Last 1 Encounters:  01/08/24 6' 3 (1.905 m)   Weight:  Wt Readings from Last 1 Encounters:  01/09/24 (!) 147 kg    Ideal Body Weight:  89.1 kg  BMI:  Body mass index is 40.5 kg/m.  Estimated Nutritional Needs:   Kcal:  2500-2700  Protein:  140-155g  Fluid:  >2L/day  Blair Deaner MS, RD, LDN Registered Dietitian Clinical Nutrition RD Inpatient Contact Info in Amion

## 2024-01-09 NOTE — Op Note (Signed)
 NAME: Philip Monroe, Philip Monroe MEDICAL RECORD NO: [id number] ACCOUNT NO: [id number] DATE OF BIRTH: 1976/03/08 FACILITY: [facility] LOCATION: [facility] PHYSICIAN: Glendia EMERSON Primrose, DDS  Operative Report   DATE OF PROCEDURE: 01/08/2024  PREOPERATIVE DIAGNOSES:  Nonrestorable teeth #4, #5, #17, and #30, secondary to dental caries.   POSTOPERATIVE DIAGNOSES:  Nonrestorable teeth #4, #5, #17, and #30, secondary to dental caries.   PROCEDURE: Extraction of teeth #4, #5, #17, and #30.  SURGEON: Glendia EMERSON Primrose, DDS  ANESTHESIA: General nasal intubation. Dr. Keneth attending.  DESCRIPTION OF PROCEDURE: The patient was taken to the operating room and placed on the table in supine position. General anesthesia was administered. A nasal endotracheal tube was placed and secured. The eyes were protected. The patient was draped for  surgery. Timeout was performed. The posterior pharynx was suctioned and a throat pack was placed. 2% lidocaine  with 1:100,000 epinephrine was infiltrated in an inferior alveolar block on the right and left side and then buccal and palatal infiltration  around teeth #4 and #5. The right side was operated first. A #15 blade was used to make an incision around tooth #30. The periosteum was reflected with a periosteal elevator. The tooth was grasped with the forceps and fractured upon attempted removal.  Then, the root was sectioned and the roots were removed independently with the 301 elevator. A root tip was left in the mesial root and it was removed by removing bone around the root and then the root was elevated and removed. The socket was curetted,  irrigated, and Gelfoam sponge was placed and then it was closed with 3-0 chromic gut suture. Then, attention was turned to the right maxilla. A #15 blade was used to make an incision around teeth #4 and #5 in the buccal and palatal gingival sulcus. The  periosteum was reflected. The teeth were elevated but could not be luxated. The Stryker handpiece was  used to remove interproximal bone and then the teeth were elevated and removed with the dental forceps. The socket was curetted, irrigated, and Gelfoam  sponge was placed and then it was closed with 3-0 chromic suture. Then, the left mandible was operated. There was a root fragment of tooth #17 that was visible at the surface of the gingival mucosa. The periosteal elevator was used to remove this  fragment, which was approximately 7 mm x 5 mm. Then, the socket was curetted, irrigated, and closed with 3-0 chromic suture. Then, the oral cavity was irrigated and suctioned. The throat pack was removed. The patient was left in care of anesthesia for  extubation and transported to recovery and discharged to the floor.  ESTIMATED BLOOD LOSS: Minimal.  COMPLICATIONS: None.  SPECIMENS: None.  COUNTS: Correct.    MUK D: 01/08/2024 12:36:30 pm T: 01/09/2024 12:39:00 am  JOB: [id number]/ [id number]

## 2024-01-10 ENCOUNTER — Inpatient Hospital Stay (HOSPITAL_COMMUNITY)

## 2024-01-10 DIAGNOSIS — I5023 Acute on chronic systolic (congestive) heart failure: Secondary | ICD-10-CM

## 2024-01-10 LAB — BLOOD GAS, ARTERIAL
Acid-Base Excess: 5 mmol/L — ABNORMAL HIGH (ref 0.0–2.0)
Bicarbonate: 29.9 mmol/L — ABNORMAL HIGH (ref 20.0–28.0)
O2 Saturation: 98.1 %
Patient temperature: 36.8
pCO2 arterial: 44 mmHg (ref 32–48)
pH, Arterial: 7.44 (ref 7.35–7.45)
pO2, Arterial: 84 mmHg (ref 83–108)

## 2024-01-10 LAB — URINALYSIS, ROUTINE W REFLEX MICROSCOPIC
Bilirubin Urine: NEGATIVE
Glucose, UA: NEGATIVE mg/dL
Hgb urine dipstick: NEGATIVE
Ketones, ur: NEGATIVE mg/dL
Leukocytes,Ua: NEGATIVE
Nitrite: NEGATIVE
Protein, ur: NEGATIVE mg/dL
Specific Gravity, Urine: 1.011 (ref 1.005–1.030)
pH: 5 (ref 5.0–8.0)

## 2024-01-10 LAB — BASIC METABOLIC PANEL WITH GFR
Anion gap: 12 (ref 5–15)
BUN: 24 mg/dL — ABNORMAL HIGH (ref 6–20)
CO2: 29 mmol/L (ref 22–32)
Calcium: 9.4 mg/dL (ref 8.9–10.3)
Chloride: 93 mmol/L — ABNORMAL LOW (ref 98–111)
Creatinine, Ser: 1.59 mg/dL — ABNORMAL HIGH (ref 0.61–1.24)
GFR, Estimated: 54 mL/min — ABNORMAL LOW (ref >60)
Glucose, Bld: 139 mg/dL — ABNORMAL HIGH (ref 70–99)
Potassium: 4.1 mmol/L (ref 3.5–5.1)
Sodium: 134 mmol/L — ABNORMAL LOW (ref 135–145)

## 2024-01-10 LAB — HEPARIN LEVEL (UNFRACTIONATED): Heparin Unfractionated: 0.37 [IU]/mL (ref 0.30–0.70)

## 2024-01-10 LAB — CBC
HCT: 44 % (ref 39.0–52.0)
Hemoglobin: 14.3 g/dL (ref 13.0–17.0)
MCH: 25.9 pg — ABNORMAL LOW (ref 26.0–34.0)
MCHC: 32.5 g/dL (ref 30.0–36.0)
MCV: 79.6 fL — ABNORMAL LOW (ref 80.0–100.0)
Platelets: 182 K/uL (ref 150–400)
RBC: 5.53 MIL/uL (ref 4.22–5.81)
RDW: 21.2 % — ABNORMAL HIGH (ref 11.5–15.5)
WBC: 14.2 K/uL — ABNORMAL HIGH (ref 4.0–10.5)
nRBC: 0 % (ref 0.0–0.2)

## 2024-01-10 LAB — SURGICAL PCR SCREEN
MRSA, PCR: NEGATIVE
Staphylococcus aureus: NEGATIVE

## 2024-01-10 LAB — PREPARE RBC (CROSSMATCH)

## 2024-01-10 LAB — MAGNESIUM: Magnesium: 1.8 mg/dL (ref 1.7–2.4)

## 2024-01-10 MED ORDER — VASOPRESSIN 20 UNITS/100 ML INFUSION FOR SHOCK
0.0400 [IU]/min | INTRAVENOUS | Status: AC
Start: 2024-01-11 — End: 2024-01-12
  Administered 2024-01-11: .03 [IU]/min via INTRAVENOUS
  Filled 2024-01-10: qty 100

## 2024-01-10 MED ORDER — SODIUM CHLORIDE 0.9 % IV SOLN
600.0000 mg | INTRAVENOUS | Status: AC
Start: 2024-01-11 — End: 2024-01-12
  Administered 2024-01-11: 600 mg via INTRAVENOUS
  Filled 2024-01-10 (×2): qty 10

## 2024-01-10 MED ORDER — MAGNESIUM SULFATE 2 GM/50ML IV SOLN
2.0000 g | Freq: Once | INTRAVENOUS | Status: AC
  Administered 2024-01-10: 2 g via INTRAVENOUS
  Filled 2024-01-10: qty 50

## 2024-01-10 MED ORDER — CHLORHEXIDINE GLUCONATE 0.12 % MT SOLN
15.0000 mL | Freq: Once | OROMUCOSAL | Status: AC
  Administered 2024-01-11: 15 mL via OROMUCOSAL
  Filled 2024-01-10: qty 15

## 2024-01-10 MED ORDER — TRANEXAMIC ACID (OHS) PUMP PRIME SOLUTION
2.0000 mg/kg | INTRAVENOUS | Status: DC
Start: 2024-01-11 — End: 2024-01-12
  Filled 2024-01-10 (×2): qty 2.89

## 2024-01-10 MED ORDER — CEFAZOLIN SODIUM-DEXTROSE 3-4 GM/150ML-% IV SOLN
3.0000 g | INTRAVENOUS | Status: AC
Start: 2024-01-11 — End: 2024-01-12
  Administered 2024-01-11: 3 g via INTRAVENOUS
  Filled 2024-01-10: qty 150

## 2024-01-10 MED ORDER — INSULIN REGULAR(HUMAN) IN NACL 100-0.9 UT/100ML-% IV SOLN
INTRAVENOUS | Status: AC
Start: 2024-01-11 — End: 2024-01-12
  Administered 2024-01-11: .8 [IU]/h via INTRAVENOUS
  Filled 2024-01-10: qty 100

## 2024-01-10 MED ORDER — DOBUTAMINE-DEXTROSE 4-5 MG/ML-% IV SOLN
2.0000 ug/kg/min | INTRAVENOUS | Status: DC
  Filled 2024-01-10: qty 250

## 2024-01-10 MED ORDER — POTASSIUM CHLORIDE 2 MEQ/ML IV SOLN
80.0000 meq | INTRAVENOUS | Status: DC
Start: 2024-01-11 — End: 2024-01-12
  Filled 2024-01-10: qty 40

## 2024-01-10 MED ORDER — PHENYLEPHRINE HCL-NACL 20-0.9 MG/250ML-% IV SOLN
0.0000 ug/min | INTRAVENOUS | Status: DC
  Filled 2024-01-10: qty 250

## 2024-01-10 MED ORDER — CHLORHEXIDINE GLUCONATE CLOTH 2 % EX PADS
6.0000 | MEDICATED_PAD | Freq: Once | CUTANEOUS | Status: AC
  Administered 2024-01-11: 6 via TOPICAL

## 2024-01-10 MED ORDER — NOREPINEPHRINE 4 MG/250ML-% IV SOLN
0.0000 ug/min | INTRAVENOUS | Status: DC
  Filled 2024-01-10: qty 250

## 2024-01-10 MED ORDER — CHLORHEXIDINE GLUCONATE CLOTH 2 % EX PADS
6.0000 | MEDICATED_PAD | Freq: Once | CUTANEOUS | Status: AC
  Administered 2024-01-10: 6 via TOPICAL

## 2024-01-10 MED ORDER — SPIRONOLACTONE 25 MG PO TABS
25.0000 mg | ORAL_TABLET | Freq: Every day | ORAL | Status: DC
  Administered 2024-01-10: 25 mg via ORAL
  Filled 2024-01-10: qty 1

## 2024-01-10 MED ORDER — EPINEPHRINE HCL 5 MG/250ML IV SOLN IN NS
0.0000 ug/min | INTRAVENOUS | Status: AC
  Administered 2024-01-11: 2 ug/min via INTRAVENOUS
  Filled 2024-01-10: qty 250

## 2024-01-10 MED ORDER — VANCOMYCIN HCL 1 G IV SOLR
1000.0000 mg | INTRAVENOUS | Status: AC
  Administered 2024-01-11: 1000 mg
  Filled 2024-01-10: qty 20

## 2024-01-10 MED ORDER — TRANEXAMIC ACID (OHS) BOLUS VIA INFUSION
15.0000 mg/kg | INTRAVENOUS | Status: AC
  Administered 2024-01-11: 2170.5 mg via INTRAVENOUS
  Filled 2024-01-10: qty 2171

## 2024-01-10 MED ORDER — FLUCONAZOLE IN SODIUM CHLORIDE 400-0.9 MG/200ML-% IV SOLN
400.0000 mg | INTRAVENOUS | Status: AC
  Administered 2024-01-11: 400 mg via INTRAVENOUS
  Filled 2024-01-10: qty 200

## 2024-01-10 MED ORDER — HEPARIN 30,000 UNITS/1000 ML (OHS) CELLSAVER SOLUTION
Status: DC
Start: 2024-01-11 — End: 2024-01-12
  Filled 2024-01-10: qty 1000

## 2024-01-10 MED ORDER — NITROGLYCERIN IN D5W 200-5 MCG/ML-% IV SOLN
0.0000 ug/min | INTRAVENOUS | Status: DC
  Filled 2024-01-10: qty 250

## 2024-01-10 MED ORDER — MILRINONE LACTATE IN DEXTROSE 20-5 MG/100ML-% IV SOLN
0.3000 ug/kg/min | INTRAVENOUS | Status: AC
  Administered 2024-01-11: .375 ug/kg/min via INTRAVENOUS
  Administered 2024-01-11: 3617.4999999999995 ug via INTRAVENOUS
  Filled 2024-01-10: qty 100

## 2024-01-10 MED ORDER — CEFAZOLIN SODIUM-DEXTROSE 2-4 GM/100ML-% IV SOLN
2.0000 g | INTRAVENOUS | Status: AC
Start: 2024-01-11 — End: 2024-01-12
  Administered 2024-01-11: 2 g via INTRAVENOUS
  Filled 2024-01-10: qty 100

## 2024-01-10 MED ORDER — DOPAMINE-DEXTROSE 3.2-5 MG/ML-% IV SOLN
0.0000 ug/kg/min | INTRAVENOUS | Status: DC
  Filled 2024-01-10: qty 250

## 2024-01-10 MED ORDER — TRANEXAMIC ACID 1000 MG/10ML IV SOLN
1.5000 mg/kg/h | INTRAVENOUS | Status: AC
Start: 2024-01-11 — End: 2024-01-12
  Administered 2024-01-11: 1.5 mg/kg/h via INTRAVENOUS
  Filled 2024-01-10: qty 25

## 2024-01-10 MED ORDER — BISACODYL 5 MG PO TBEC
5.0000 mg | DELAYED_RELEASE_TABLET | Freq: Once | ORAL | Status: AC
  Administered 2024-01-10: 5 mg via ORAL
  Filled 2024-01-10: qty 1

## 2024-01-10 MED ORDER — VANCOMYCIN HCL 1500 MG/300ML IV SOLN
1500.0000 mg | INTRAVENOUS | Status: AC
  Administered 2024-01-11: 1500 mg via INTRAVENOUS
  Filled 2024-01-10: qty 300

## 2024-01-10 MED ORDER — MAGNESIUM SULFATE 50 % IJ SOLN
40.0000 meq | INTRAMUSCULAR | Status: DC
Start: 2024-01-11 — End: 2024-01-12
  Filled 2024-01-10: qty 9.85

## 2024-01-10 MED ORDER — DEXMEDETOMIDINE HCL IN NACL 400 MCG/100ML IV SOLN
0.1000 ug/kg/h | INTRAVENOUS | Status: AC
Start: 2024-01-11 — End: 2024-01-12
  Administered 2024-01-11: .7 ug/kg/h via INTRAVENOUS
  Administered 2024-01-11: .3 ug/kg/h via INTRAVENOUS
  Filled 2024-01-10 (×2): qty 100

## 2024-01-10 MED ORDER — TEMAZEPAM 15 MG PO CAPS: 15.0000 mg | ORAL_CAPSULE | Freq: Once | ORAL | Status: DC | PRN

## 2024-01-10 MED ORDER — MUPIROCIN 2 % EX OINT
1.0000 | TOPICAL_OINTMENT | Freq: Two times a day (BID) | CUTANEOUS | Status: DC
  Filled 2024-01-10: qty 22

## 2024-01-10 NOTE — Progress Notes (Signed)
 Brief MCS rounding note:   Met with patient at bedside. Completed pre-implant Intermacs completed including:  Quality of Life, KCCQ-12, and Neurocognitive trail making. Pt completed 877 feet during 6 minute walk with PT.   Procedure consent obtained with pt and surgeon. Pt would like VAD coordinator to call daughter Darryle with updates from OR. All questions answered at this time regarding implant tomorrow. See criteria note for further details.   Isaiah Knoll RN VAD Coordinator  Office: [phone]  24/7 Pager: [phone]

## 2024-01-10 NOTE — Progress Notes (Signed)
 H&V Care Navigation CSW Progress Note  Outpatient Heart Failure Clinical Social Worker met with pt at bedside to check in prior to surgery tomorrow.  Patient is nervous but ready to move forward.  Remains hopeful for improved quality of life following surgery despite some lifestyle adjustments.  CSW informed pt that I would continue to follow him during inpatient stay and encouraged him to talk with me regarding struggles with mental health while in the hospital- pt expressed understanding.  Will continue to follow inpatient and assist as needed  Courtni Balash H. Vaudie Engebretsen, LCSW Clinical Social Worker Advanced Heart Failure Clinic Desk#: [phone] Cell#: [phone]

## 2024-01-10 NOTE — Progress Notes (Signed)
 PHARMACY - ANTICOAGULATION CONSULT NOTE  Pharmacy Consult for heparin  Indication: LV thrombus  Allergies  Allergen Reactions   Lactose Intolerance (Gi) Diarrhea   Statins     myalgias    Patient Measurements: Height: 6' 3 (190.5 cm) Weight: (!) 144.7 kg (319 lb) IBW/kg (Calculated) : 84.5 HEPARIN  DW (KG): 118  Vital Signs: Temp: 98.3 F (36.8 C) (10/29 0754) Temp Source: Oral (10/29 0754) BP: 108/77 (10/29 0754) Pulse Rate: 71 (10/29 0754)  Labs: Recent Labs    01/08/24 0250 01/09/24 0222 01/09/24 1039 01/09/24 2011 01/10/24 0229  HGB 13.6 14.8  --   --  14.3  HCT 42.7 46.7  --   --  44.0  PLT 173 202  --   --  182  HEPARINUNFRC 0.52 <0.10*  --  0.33 0.37  CREATININE 1.39* 1.51* 1.38*  --  1.59*    Estimated Creatinine Clearance: 88.2 mL/min (A) (by C-G formula based on SCr of 1.59 mg/dL (H)).  Assessment: 16 yoM admitted for RHC. Pt on apixaban  PTA for hx LV thrombus, now to start IV heparin  post/cath. LD apixaban  10/22.  Heparin  level this AM was within goal range at 0.37.  No overt bleeding or complications noted.  CBC stable.  Goal of Therapy:  Heparin  level 0.3-0.7 units/ml aPTT 66-102 seconds Monitor platelets by anticoagulation protocol: Yes   Plan:  Continue IV heparin  at 1850 units/hr. Daily heparin  level and CBC. F/u plans for oral anticoagulation when able to take po.  Harlene Barlow, Berdine BIRCH, BCPS, [facility] Clinical Pharmacist  01/10/2024 10:26 AM   [facility] pharmacy phone numbers are listed on [email or web address]

## 2024-01-10 NOTE — Progress Notes (Signed)
 Philip Monroe has been discussed with the VAD Medical Review board on 12/27/23. The team feels as if the patient is a good candidate for Destination LVAD therapy. The patient meets criteria for a LVAD implant as listed below:  1) NYHA Class: _IV_____ documented on __10/24/25__(date)  2) Has a left ventricular ejection fraction (LVEF) < 25%   *EF__<20%____ by echo (date) _9/14/25____  3) Must meet one of the following:   Is inotrope dependent   *On inotropes_____started________  OR  Has a Cardiac Index (CI) < 2.2  L/min/m2 while not on inotropes:   *CI:__1.9__RHC 10/24/25__   4) Must meet one of the following:   ___X___ Is on optimal medical management (OMM), based on current heart failure practice guidelines for at least 45 of the last 60 days and are failing to respond   ______ Has advanced heart failure for at least 14 days and are dependent on an intra-aortic balloon pump (IABP) or similar temporary mechanical circulatory support for at least 7 days      ____ IABP inserted (date) ____      ____ Impella inserted (date) ____  5)  Social work and palliative care evaluations demonstrate appropriate support system in place for discharge to home with a VAD and that end of life discussions have taken place. Both services have expressed no concern regarding patient's candidacy.         *Social work consult (date): 12/13/23        *Palliative Care Consult (date): 01/06/24  6)  Primary caretaker identified that can be taught along with the patient how to manage the VAD equipment.        *Name: Madelyne (daughter)  5)  Deemed appropriate by our financial coordinator: Graig Miracle        Prior approval: Implant covered under inpatient hospitalization per [employer]. Auth #: [id number]  8)  VAD Coordinator, Lauraine Ip has met with patient and caregiver, shown them the VAD equipment and discussed with the patient and caregiver about lifestyle changes necessary for success on  mechanical circulatory device.        *Met with [name] on 12/08/23.       *Consent for VAD Evaluation/Caregiver Agreement/HIPPA Release/Photo Release signed on 12/08/23   9)  Six Minute Walk:  877 ft   10) KCCQ Pre VAD:  [place] Cardiomyopathy Questionnaire  KCCQ-12    1 a. Ability to shower/bathe Moderately limited   1 b. Ability to walk 1 block Extremely limited   1 c. Ability to hurry/jog Extremely limited   2. Edema feet/ankles/legs Never over the past 2 weeks   3. Limited by fatigue Several times per day   4. Limited by dyspnea Several times per day   5. Sitting up / on 3+ pillows Never over the past 2 weeks   6. Limited enjoyment of life Moderately limited   7. Rest of life w/ symptoms Not at all satisfied   8 a. Participation in hobbies Severely limited   8 b. Participation in chores Severely limited   8 c. Visiting family/friends Moderately limited     11)  Intermacs profile: 4  INTERMACS 1: Critical cardiogenic shock describes a patient who is "crashing and burning", in which a patient has life-threatening hypotension and rapidly escalating inotropic pressor support, with critical organ hypoperfusion often confirmed by worsening acidosis and lactate levels.  INTERMACS 2: Progressive decline describes a patient who has been demonstrated "dependent" on inotropic support but nonetheless shows signs of continuing deterioration  in nutrition, renal function, fluid retention, or other major status indicator. Patient profile 2 can also describe a patient with refractory volume overload, perhaps with evidence of impaired perfusion, in whom inotropic infusions cannot be maintained due to tachyarrhythmias, clinical ischemia, or other intolerance.  INTERMACS 3: Stable but inotrope dependent describes a patient who is clinically stable on mild-moderate doses of intravenous inotropes (or has a temporary circulatory support device) after repeated documentation of failure to wean  without symptomatic hypotension, worsening symptoms, or progressive organ dysfunction (usually renal). It is critical to monitor nutrition, renal function, fluid balance, and overall status carefully in order to distinguish between a   patient who is truly stable at Patient Profile 3 and a patient who has unappreciated decline rendering them Patient Profile 2. This patient may be either at home or in the hospital.      INTERMACS 4: Resting symptoms describes a patient who is at home on oral therapy but frequently has symptoms of congestion at rest or with activities of daily living (ADL). He or she may have orthopnea, shortness of breath during ADL such as dressing or bathing, gastrointestinal symptoms (abdominal discomfort, nausea, poor appetite), disabling ascites or severe lower extremity edema. This patient should be carefully considered for more intensive management and surveillance programs, which may in some cases, reveal poor compliance that would compromise outcomes with any therapy.   .   INTERMACS 5: Exertion Intolerant describes a patient who is comfortable at rest but unable to engage in any activity, living predominantly within the house or housebound. This patient has no congestive symptoms, but may have chronically elevated volume status, frequently with renal dysfunction, and may be characterized as exercise intolerant.      INTERMACS 6: Exertion Limited also describes a patient who is comfortable at rest without evidence of fluid overload, but who is able to do some mild activity. Activities of daily living are comfortable and minor activities outside the home such as visiting friends or going to a restaurant can be performed, but fatigue results within a few minutes of any meaningful physical exertion. This patient has occasional episodes of worsening symptoms and is likely to have had a hospitalization for heart failure within the past year.   INTERMACS 7: Advanced NYHA Class 3  describes a patient who is clinically stable with a reasonable level of comfortable activity, despite history of previous decompensation that is not recent. This patient is usually able to walk more than a block. Any decompensation requiring intravenous diuretics or hospitalization within the previous month should make this person a Patient Profile 6 or lower.     Isaiah Knoll RN VAD Coordinator  Office: [phone]  24/7 Pager: [phone]

## 2024-01-10 NOTE — Progress Notes (Addendum)
 Advanced Heart Failure Rounding Note  Chief Complaint: Pre-op VAD Subjective:    Plan for VAD tomorrow.  sCr 1.59. K 4.1  Sitting up in chair. Feeling well, no complaints. Jaw remains sore, stitches are intact.  Objective:    Vital Signs:   Temp:  [97.7 F (36.5 C)-98.3 F (36.8 C)] 98.3 F (36.8 C) (10/29 0754) Pulse Rate:  [62-85] 71 (10/29 0754) BP: (103-145)/(68-99) 108/77 (10/29 0754) SpO2:  [94 %-100 %] 96 % (10/29 0754) Weight:  [144.7 kg] 144.7 kg (10/29 0332) Last BM Date : 01/09/24  Weight change: Filed Weights   01/08/24 0656 01/09/24 0437 01/10/24 0332  Weight: (!) 147 kg (!) 147 kg (!) 144.7 kg   Intake/Output:  Intake/Output Summary (Last 24 hours) at 01/10/2024 1129 Last data filed at 01/10/2024 1103 Gross per 24 hour  Intake 1113.16 ml  Output 375 ml  Net 738.16 ml    Physical Exam  General: Well appearing. No distress Cardiac: S1 and S2 present. No murmurs. Extremities: Warm and dry.  No edema.  Neuro: Alert and oriented x3. Affect pleasant. Moves all extremities without difficulty.  Telemetry: SR 70s, occasional PVCs, 2 episodes of bigeminy  Labs: Basic Metabolic Panel: Recent Labs  Lab 01/06/24 0339 01/06/24 1834 01/07/24 0308 01/08/24 0250 01/09/24 0222 01/09/24 1039 01/10/24 0229  NA 134*   < > 136 134* 133* 133* 134*  K 3.1*   < > 3.9 3.9 5.7* 5.2* 4.1  CL 101   < > 105 101 97* 98 93*  CO2 23   < > 21* 22 27 27 29   GLUCOSE 102*   < > 103* 96 123* 110* 139*  BUN 17   < > 14 14 15 17  24*  CREATININE 1.32*   < > 1.16 1.39* 1.51* 1.38* 1.59*  CALCIUM 8.8*   < > 8.8* 8.7* 9.7 9.7 9.4  MG 1.9  --  2.2 1.6* 2.3  --  1.8   < > = values in this interval not displayed.   Liver Function Tests: Recent Labs  Lab 01/05/24 1907  AST 25  ALT 30  ALKPHOS 62  BILITOT 0.9  PROT 6.1*  ALBUMIN 3.3*   No results for input(s): LIPASE, AMYLASE in the last 168 hours. No results for input(s): AMMONIA in the last 168  hours.  CBC: Recent Labs  Lab 01/05/24 1907 01/06/24 0339 01/07/24 0308 01/08/24 0250 01/09/24 0222 01/10/24 0229  WBC 7.6 7.8 8.5 9.5 11.8* 14.2*  NEUTROABS 3.1  --   --   --   --   --   HGB 13.7 13.8 13.5 13.6 14.8 14.3  HCT 43.6 43.0 42.2 42.7 46.7 44.0  MCV 80.3 79.9* 78.9* 79.7* 79.6* 79.6*  PLT 165 158 163 173 202 182   BNP (last 3 results) Recent Labs    11/24/23 1618 12/25/23 1139 01/05/24 1907  BNP 1,700.4* 315.6* 674.8*   Medications:    Scheduled Medications:  allopurinol   100 mg Oral Daily   colchicine   0.6 mg Oral BID   digoxin   0.125 mg Oral Daily   ezetimibe   10 mg Oral QHS   HYDROcodone -acetaminophen   1 tablet Oral Once   mexiletine  300 mg Oral BID   multivitamin with minerals  1 tablet Oral Daily   sodium chloride  flush  3 mL Intravenous Q12H   spironolactone   25 mg Oral Daily   torsemide   40 mg Oral Daily    Infusions:  heparin  1,850 Units/hr (01/10/24 0407)  magnesium sulfate bolus IVPB 2 g (01/10/24 1108)    PRN Medications: acetaminophen , albuterol , ondansetron  (ZOFRAN ) IV, oxyCODONE -acetaminophen , sodium chloride  flush   Plan/Discussion:    1. 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  PVCs may also contribute, 25.3% PVCs with NSVT on Zio monitor in 5/25 -Echo 9/25 with EF < 20%, severe LV dilation, moderate RV enlargement with low normal systolic function, moderate MR, dilated IVC.   Re-admitted in 9/25.  Had been out of most medications. RHC markedly elevated filling pressures with low CI (1.6 Fick/1.77 thermo). Cardiac MRI LV EF 14%, RV EF 29%, suspect mixed ischemic/nonischemic cardiomyopathy with scarring process due to MI in the mid inferior/inferolateral wall and non-coronary type mid-wall LGE in the basal septum.  He was started on milrinone  and diuresed, then milrinone  weaned off.   - RHC 01/05/24 with low filling pressures and reduced CI (1.9 TD,  2.2 Fick). - NYHA class IV symptoms on admission.  - Volume ok. Continue Torsemide  40 mg daily  - Hold entresto  and jardiance  preop - Continue digoxin  0.125 mg daily  - On spironolactone  25 mg daily.  - Off Toprol  XL with low output.  - With weight and smoking (quit < 1 month), not a candidate for heart transplant.   - Now s/p dental extractions 01/08/24.  - Plan for VAD implant 10/30.     2. CAD:  LHC 5/24 at [facility] showed severe multivessel CAD including 80% mid RCA, 100% RPDA, 100% mid LCx, 80% OM1, 80% mid-distal LAD. Medical management recommended by [facility] cardiology 05/24.  Repeat coronary angiography in 9/25 showed occluded PDA and mid AV LCx, 90% ramus, severe diffuse disease in right PLV, nonobstructive LAD disease.  With markedly low EF and without severe LAD disease, CABG nor a durable option.  - No ASA given anticoagulation use and stable CAD.  - Continue Zetia , has not been able to tolerate statin.  Refer to lipid clinic for Repatha as outpatient. Lp(a) is very elevated.  - Continue statin - No s/s angina   3. PVCs: Zio 5/25 with 25.3%, NSVT noted burden.  He did not tolerate amiodarone  well and was started on mexiletine.  - Follow on tele - Continue mexilitene   - Will need outpatient sleep study.    4. Morbid obesity:  - Body mass index is 39.87 kg/m. - consider GLP1RA in the future  5. Tobacco use:  - Has quit < 40month  6. Anticoagulation: Possible LV thrombus by echo in the past. No LV thrombus on cardiac MRI 9/25.  -  Off Eliquis .On heparin . No bleeding  7. Gout: Very symptomatic, knees and ankles.   - Continue allopurinol  and colchicine   8. ? Hyperkalemia - early AM BMP showed K 5.7, ? Hemolyzed. Repeat BMP in progress   9. Dental Carries - s/p extractions - Dr. Jesnon has re-assessed. Ice for swelling. OK for regular diet.  Length of Stay: 5  Jordan Lee, NP 01/10/2024, 11:29 AM  Advanced Heart Failure Team Pager [phone] (M-F; 7a - 4p)  Please contact  [facility] for night-coverage after hours (4p -7a ) and weekends on [email or web address]   Patient seen and examined with the above-signed Advanced Practice Provider and/or Housestaff. I personally reviewed laboratory data, imaging studies and relevant notes. I independently examined the patient and formulated the important aspects of the plan. I have edited the note to reflect any of my changes or salient points. I have personally discussed the  plan with the patient and/or family.  Right lower jaw still somewhat sore but no bleeding. Able to take po.   No CP or SOB  General:  Sitting up in bed. No resp difficulty HEENT: normal Neck: supple. no JVD.  Cor: Regular rate & rhythm. No rubs, gallops or murmurs. Lungs: clear Abdomen: soft, nontender, nondistended.Good bowel sounds. Extremities: no cyanosis, clubbing, rash, edema Neuro: alert & orientedx3, cranial nerves grossly intact. moves all 4 extremities w/o difficulty. Affect pleasant  Stable on current regimen. For VAD tomorrow. Volume status looks good.   We discussed VAD surgery.   Toribio Fuel, MD  1:53 PM

## 2024-01-10 NOTE — Evaluation (Signed)
 Physical Therapy Evaluation Patient Details Name: Philip Monroe MRN: [id number] DOB: [birth year] Today's Date: 01/10/2024  History of Present Illness  Philip Monroe is a 48 y.o. male who presented 10/24 for right heart cath with plan for LVAD implantation 10/30. S/p dental teeth extraction 10/27. PMH: aortic root dilatation, asthma, CAD, CHF, CKD stage II, gout, DM, HLD, HTN, IVCD, morbid obesity, left ventricular noncompation, NSVT, tobacco abuse   Clinical Impression  Philip Monroe presents with condition above and deficits mentioned below, see Philip Monroe Problem List. PTA, he was independent, living with his mother and adult daughter, who can provide 24/7 care as needed. He lives in a mobile home with 3 STE. Currently, he is mobilizing independently without LOB or need for an AD. However, his assistance level and DME needs may change s/p LVAD planned for 10/30. He may benefit from a 3in1 s/p LVAD placement to heighten his low toilets at home to improve his ease with transfers while maintaining sternal precautions s/p procedure. Educated Philip Monroe and provided him with sternal precautions and exercises while following sternal precautions handouts. He demonstrated understanding. Will continue to follow acutely s/p LVAD placement and re-assess d/c needs then.   6 MWT = 877.3 ft; gait speed = 2.44 ft/sec                  If plan is discharge home, recommend the following:  (TBD post LVAD placement)   Can travel by private vehicle        Equipment Recommendations BSC/3in1 (after LVAD procedure; pending RW needs post procedure)  Recommendations for Other Services       Functional Status Assessment Patient has not had a recent decline in their functional status     Precautions / Restrictions Precautions Precautions: None Restrictions Weight Bearing Restrictions Per Provider Order: No      Mobility  Bed Mobility Overal bed mobility: Modified Independent             General bed mobility comments: HOB elevated, no  assistance needed. Educated Philip Monroe on exiting and entering bed while maintaining sternal precautions with Philip Monroe demonstrating understanding    Transfers Overall transfer level: Independent Equipment used: None               General transfer comment: No LOB or assistance needed. Philip Monroe able to stand with hands on lap to demonstrate comprehension of sternal precautions in prep for LVAD    Ambulation/Gait Ambulation/Gait assistance: Independent Gait Distance (Feet): 1030 Feet Assistive device: None Gait Pattern/deviations: WFL(Within Functional Limits) Gait velocity: 2.44 ft/sec Gait velocity interpretation: 1.31 - 2.62 ft/sec, indicative of limited community ambulator   General Gait Details: No drastic gait deviations noted. No LOB. Philip Monroe ambulated 877.3 ft in 6 minutes during 6 MWT. Average speed of 2.44 ft/sec.  Stairs            Wheelchair Mobility     Tilt Bed    Modified Rankin (Stroke Patients Only)       Balance Overall balance assessment: No apparent balance deficits (not formally assessed)                                           Pertinent Vitals/Pain Pain Assessment Pain Assessment: Faces Faces Pain Scale: No hurt Pain Intervention(s): Monitored during session    Home Living Family/patient expects to be discharged to:: Private residence Living Arrangements: Children;Parent (adult daughter) Available Help at  Discharge: Family;Available 24 hours/day Type of Home: Mobile home Home Access: Stairs to enter Entrance Stairs-Rails: Can reach both Entrance Stairs-Number of Steps: 3   Home Layout: One level Home Equipment: Crutches      Prior Function Prior Level of Function : Independent/Modified Independent             Mobility Comments: uses crutches when he has gout flair up but otherwise no AD       Extremity/Trunk Assessment   Upper Extremity Assessment Upper Extremity Assessment: Overall WFL for tasks assessed    Lower  Extremity Assessment Lower Extremity Assessment: Overall WFL for tasks assessed    Cervical / Trunk Assessment Cervical / Trunk Assessment: Normal  Communication   Communication Communication: No apparent difficulties    Cognition Arousal: Alert Behavior During Therapy: WFL for tasks assessed/performed   Philip Monroe - Cognitive impairments: No apparent impairments                         Following commands: Intact       Cueing Cueing Techniques: Verbal cues     General Comments General comments (skin integrity, edema, etc.): Educated Philip Monroe and provided him with sternal precautions and exercises while following sternal precautions handouts.    Exercises     Assessment/Plan    Philip Monroe Assessment Patient needs continued Philip Monroe services  Philip Monroe Problem List Decreased activity tolerance;Cardiopulmonary status limiting activity       Philip Monroe Treatment Interventions DME instruction;Gait training;Stair training;Functional mobility training;Therapeutic activities;Therapeutic exercise;Balance training;Neuromuscular re-education;Patient/family education    Philip Monroe Goals (Current goals can be found in the Care Plan section)  Acute Rehab Philip Monroe Goals Patient Stated Goal: to recover well s/p LVAD Philip Monroe Goal Formulation: With patient Time For Goal Achievement: 01/24/24 Potential to Achieve Goals: Good    Frequency Min 1X/week     Co-evaluation               AM-PAC Philip Monroe 6 Clicks Mobility  Outcome Measure Help needed turning from your back to your side while in a flat bed without using bedrails?: None Help needed moving from lying on your back to sitting on the side of a flat bed without using bedrails?: None Help needed moving to and from a bed to a chair (including a wheelchair)?: None Help needed standing up from a chair using your arms (e.g., wheelchair or bedside chair)?: None Help needed to walk in hospital room?: None Help needed climbing 3-5 steps with a railing? : None 6 Click Score: 24     End of Session   Activity Tolerance: Patient tolerated treatment well Patient left: in bed;with call bell/phone within reach Nurse Communication: Mobility status Philip Monroe Visit Diagnosis: Other (comment);Difficulty in walking, not elsewhere classified (R26.2) (endurance deficits; awaiting LVAD procedure)    Time: 0835-0900 Philip Monroe Time Calculation (min) (ACUTE ONLY): 25 min   Charges:   Philip Monroe Evaluation $Philip Monroe Eval Low Complexity: 1 Low Philip Monroe Treatments $Therapeutic Activity: 8-22 mins Philip Monroe General Charges $$ ACUTE Philip Monroe VISIT: 1 Visit         Theo Ferretti, Philip Monroe, DPT Acute Rehabilitation Services  Office: [phone]   Theo CHRISTELLA Ferretti 01/10/2024, 9:39 AM

## 2024-01-10 NOTE — Progress Notes (Signed)
 This chaplain is present for F/U spiritual care in the setting of LVAD on Thursday.  The Pt. is awake in the bedside recliner without any questions.   The chaplain listened reflectively as the Pt. talked about preparing his family and accepting the family has planned well.   The Pt. continues to focus on the future in his plans and offering the family life lessons from his experiences.    The chaplain is available for F/U spiritual care as needed.  Chaplain Leeroy Hummer [phone]

## 2024-01-11 ENCOUNTER — Inpatient Hospital Stay (HOSPITAL_COMMUNITY): Payer: Self-pay | Admitting: Anesthesiology

## 2024-01-11 ENCOUNTER — Inpatient Hospital Stay (HOSPITAL_COMMUNITY)

## 2024-01-11 ENCOUNTER — Inpatient Hospital Stay: Admit: 2024-01-11 | Payer: MEDICAID

## 2024-01-11 ENCOUNTER — Encounter (HOSPITAL_COMMUNITY): Payer: Self-pay | Admitting: Internal Medicine

## 2024-01-11 ENCOUNTER — Inpatient Hospital Stay (HOSPITAL_COMMUNITY): Admission: AD | Disposition: A | Discharge status: Home / Self Care | Payer: Self-pay | Source: Home / Self Care

## 2024-01-11 DIAGNOSIS — J9600 Acute respiratory failure, unspecified whether with hypoxia or hypercapnia: Secondary | ICD-10-CM | POA: Diagnosis not present

## 2024-01-11 DIAGNOSIS — Z95811 Presence of heart assist device: Secondary | ICD-10-CM

## 2024-01-11 DIAGNOSIS — I5084 End stage heart failure: Secondary | ICD-10-CM

## 2024-01-11 DIAGNOSIS — N183 Chronic kidney disease, stage 3 unspecified: Secondary | ICD-10-CM | POA: Diagnosis not present

## 2024-01-11 DIAGNOSIS — Z87891 Personal history of nicotine dependence: Secondary | ICD-10-CM

## 2024-01-11 DIAGNOSIS — I5023 Acute on chronic systolic (congestive) heart failure: Secondary | ICD-10-CM | POA: Diagnosis not present

## 2024-01-11 DIAGNOSIS — I5022 Chronic systolic (congestive) heart failure: Secondary | ICD-10-CM | POA: Diagnosis not present

## 2024-01-11 DIAGNOSIS — I10 Essential (primary) hypertension: Secondary | ICD-10-CM

## 2024-01-11 DIAGNOSIS — I2510 Atherosclerotic heart disease of native coronary artery without angina pectoris: Secondary | ICD-10-CM

## 2024-01-11 DIAGNOSIS — I5020 Unspecified systolic (congestive) heart failure: Secondary | ICD-10-CM | POA: Diagnosis not present

## 2024-01-11 HISTORY — PX: INTRAOPERATIVE TRANSESOPHAGEAL ECHOCARDIOGRAM: SHX5062

## 2024-01-11 HISTORY — PX: INSERTION OF IMPLANTABLE LEFT VENTRICULAR ASSIST DEVICE: SHX5866

## 2024-01-11 LAB — POCT I-STAT 7, (LYTES, BLD GAS, ICA,H+H)
Acid-Base Excess: 0 mmol/L (ref 0.0–2.0)
Acid-Base Excess: 1 mmol/L (ref 0.0–2.0)
Acid-Base Excess: 1 mmol/L (ref 0.0–2.0)
Acid-Base Excess: 0 mmol/L (ref 0.0–2.0)
Acid-Base Excess: 4 mmol/L — ABNORMAL HIGH (ref 0.0–2.0)
Acid-base deficit: 1 mmol/L (ref 0.0–2.0)
Acid-base deficit: 1 mmol/L (ref 0.0–2.0)
Acid-base deficit: 3 mmol/L — ABNORMAL HIGH (ref 0.0–2.0)
Acid-base deficit: 2 mmol/L (ref 0.0–2.0)
Acid-base deficit: 2 mmol/L (ref 0.0–2.0)
Bicarbonate: 26.5 mmol/L (ref 20.0–28.0)
Bicarbonate: 26.5 mmol/L (ref 20.0–28.0)
Bicarbonate: 26 mmol/L (ref 20.0–28.0)
Bicarbonate: 25.5 mmol/L (ref 20.0–28.0)
Bicarbonate: 28.7 mmol/L — ABNORMAL HIGH (ref 20.0–28.0)
Bicarbonate: 24.1 mmol/L (ref 20.0–28.0)
Bicarbonate: 24.6 mmol/L (ref 20.0–28.0)
Bicarbonate: 23 mmol/L (ref 20.0–28.0)
Bicarbonate: 23.2 mmol/L (ref 20.0–28.0)
Bicarbonate: 23.4 mmol/L (ref 20.0–28.0)
Calcium, Ion: 1.19 mmol/L (ref 1.15–1.40)
Calcium, Ion: 1.21 mmol/L (ref 1.15–1.40)
Calcium, Ion: 1.09 mmol/L — ABNORMAL LOW (ref 1.15–1.40)
Calcium, Ion: 1.02 mmol/L — ABNORMAL LOW (ref 1.15–1.40)
Calcium, Ion: 0.99 mmol/L — ABNORMAL LOW (ref 1.15–1.40)
Calcium, Ion: 1.09 mmol/L — ABNORMAL LOW (ref 1.15–1.40)
Calcium, Ion: 1.11 mmol/L — ABNORMAL LOW (ref 1.15–1.40)
Calcium, Ion: 1.12 mmol/L — ABNORMAL LOW (ref 1.15–1.40)
Calcium, Ion: 1.14 mmol/L — ABNORMAL LOW (ref 1.15–1.40)
Calcium, Ion: 1.18 mmol/L (ref 1.15–1.40)
HCT: 42 % (ref 39.0–52.0)
HCT: 44 % (ref 39.0–52.0)
HCT: 35 % — ABNORMAL LOW (ref 39.0–52.0)
HCT: 32 % — ABNORMAL LOW (ref 39.0–52.0)
HCT: 27 % — ABNORMAL LOW (ref 39.0–52.0)
HCT: 33 % — ABNORMAL LOW (ref 39.0–52.0)
HCT: 32 % — ABNORMAL LOW (ref 39.0–52.0)
HCT: 33 % — ABNORMAL LOW (ref 39.0–52.0)
HCT: 34 % — ABNORMAL LOW (ref 39.0–52.0)
HCT: 33 % — ABNORMAL LOW (ref 39.0–52.0)
Hemoglobin: 14.3 g/dL (ref 13.0–17.0)
Hemoglobin: 15 g/dL (ref 13.0–17.0)
Hemoglobin: 11.9 g/dL — ABNORMAL LOW (ref 13.0–17.0)
Hemoglobin: 10.9 g/dL — ABNORMAL LOW (ref 13.0–17.0)
Hemoglobin: 9.2 g/dL — ABNORMAL LOW (ref 13.0–17.0)
Hemoglobin: 11.2 g/dL — ABNORMAL LOW (ref 13.0–17.0)
Hemoglobin: 10.9 g/dL — ABNORMAL LOW (ref 13.0–17.0)
Hemoglobin: 11.2 g/dL — ABNORMAL LOW (ref 13.0–17.0)
Hemoglobin: 11.6 g/dL — ABNORMAL LOW (ref 13.0–17.0)
Hemoglobin: 11.2 g/dL — ABNORMAL LOW (ref 13.0–17.0)
O2 Saturation: 100 %
O2 Saturation: 79 %
O2 Saturation: 100 %
O2 Saturation: 100 %
O2 Saturation: 99 %
O2 Saturation: 100 %
O2 Saturation: 99 %
O2 Saturation: 99 %
O2 Saturation: 99 %
O2 Saturation: 98 %
Patient temperature: 37.1
Patient temperature: 36.6
Patient temperature: 36.9
Patient temperature: 36.9
Potassium: 4.3 mmol/L (ref 3.5–5.1)
Potassium: 4.5 mmol/L (ref 3.5–5.1)
Potassium: 3.9 mmol/L (ref 3.5–5.1)
Potassium: 4.1 mmol/L (ref 3.5–5.1)
Potassium: 4.2 mmol/L (ref 3.5–5.1)
Potassium: 4.4 mmol/L (ref 3.5–5.1)
Potassium: 4.8 mmol/L (ref 3.5–5.1)
Potassium: 5.1 mmol/L (ref 3.5–5.1)
Potassium: 5.1 mmol/L (ref 3.5–5.1)
Potassium: 4.8 mmol/L (ref 3.5–5.1)
Sodium: 133 mmol/L — ABNORMAL LOW (ref 135–145)
Sodium: 133 mmol/L — ABNORMAL LOW (ref 135–145)
Sodium: 134 mmol/L — ABNORMAL LOW (ref 135–145)
Sodium: 133 mmol/L — ABNORMAL LOW (ref 135–145)
Sodium: 135 mmol/L (ref 135–145)
Sodium: 134 mmol/L — ABNORMAL LOW (ref 135–145)
Sodium: 134 mmol/L — ABNORMAL LOW (ref 135–145)
Sodium: 132 mmol/L — ABNORMAL LOW (ref 135–145)
Sodium: 132 mmol/L — ABNORMAL LOW (ref 135–145)
Sodium: 133 mmol/L — ABNORMAL LOW (ref 135–145)
TCO2: 28 mmol/L (ref 22–32)
TCO2: 28 mmol/L (ref 22–32)
TCO2: 27 mmol/L (ref 22–32)
TCO2: 27 mmol/L (ref 22–32)
TCO2: 30 mmol/L (ref 22–32)
TCO2: 25 mmol/L (ref 22–32)
TCO2: 26 mmol/L (ref 22–32)
TCO2: 24 mmol/L (ref 22–32)
TCO2: 24 mmol/L (ref 22–32)
TCO2: 25 mmol/L (ref 22–32)
pCO2 arterial: 44.3 mmHg (ref 32–48)
pCO2 arterial: 47.2 mmHg (ref 32–48)
pCO2 arterial: 43.5 mmHg (ref 32–48)
pCO2 arterial: 42.4 mmHg (ref 32–48)
pCO2 arterial: 45.6 mmHg (ref 32–48)
pCO2 arterial: 42.5 mmHg (ref 32–48)
pCO2 arterial: 45.8 mmHg (ref 32–48)
pCO2 arterial: 43.3 mmHg (ref 32–48)
pCO2 arterial: 41.2 mmHg (ref 32–48)
pCO2 arterial: 42.7 mmHg (ref 32–48)
pH, Arterial: 7.357 (ref 7.35–7.45)
pH, Arterial: 7.386 (ref 7.35–7.45)
pH, Arterial: 7.385 (ref 7.35–7.45)
pH, Arterial: 7.387 (ref 7.35–7.45)
pH, Arterial: 7.406 (ref 7.35–7.45)
pH, Arterial: 7.362 (ref 7.35–7.45)
pH, Arterial: 7.338 — ABNORMAL LOW (ref 7.35–7.45)
pH, Arterial: 7.331 — ABNORMAL LOW (ref 7.35–7.45)
pH, Arterial: 7.358 (ref 7.35–7.45)
pH, Arterial: 7.346 — ABNORMAL LOW (ref 7.35–7.45)
pO2, Arterial: 451 mmHg — ABNORMAL HIGH (ref 83–108)
pO2, Arterial: 46 mmHg — ABNORMAL LOW (ref 83–108)
pO2, Arterial: 290 mmHg — ABNORMAL HIGH (ref 83–108)
pO2, Arterial: 203 mmHg — ABNORMAL HIGH (ref 83–108)
pO2, Arterial: 130 mmHg — ABNORMAL HIGH (ref 83–108)
pO2, Arterial: 202 mmHg — ABNORMAL HIGH (ref 83–108)
pO2, Arterial: 127 mmHg — ABNORMAL HIGH (ref 83–108)
pO2, Arterial: 133 mmHg — ABNORMAL HIGH (ref 83–108)
pO2, Arterial: 126 mmHg — ABNORMAL HIGH (ref 83–108)
pO2, Arterial: 106 mmHg (ref 83–108)

## 2024-01-11 LAB — GLUCOSE, CAPILLARY
Glucose-Capillary: 180 mg/dL — ABNORMAL HIGH (ref 70–99)
Glucose-Capillary: 181 mg/dL — ABNORMAL HIGH (ref 70–99)
Glucose-Capillary: 209 mg/dL — ABNORMAL HIGH (ref 70–99)
Glucose-Capillary: 212 mg/dL — ABNORMAL HIGH (ref 70–99)
Glucose-Capillary: 175 mg/dL — ABNORMAL HIGH (ref 70–99)
Glucose-Capillary: 173 mg/dL — ABNORMAL HIGH (ref 70–99)
Glucose-Capillary: 161 mg/dL — ABNORMAL HIGH (ref 70–99)
Glucose-Capillary: 133 mg/dL — ABNORMAL HIGH (ref 70–99)

## 2024-01-11 LAB — BASIC METABOLIC PANEL WITH GFR
Anion gap: 9 (ref 5–15)
Anion gap: 11 (ref 5–15)
BUN: 19 mg/dL (ref 6–20)
BUN: 17 mg/dL (ref 6–20)
CO2: 23 mmol/L (ref 22–32)
CO2: 22 mmol/L (ref 22–32)
Calcium: 7.6 mg/dL — ABNORMAL LOW (ref 8.9–10.3)
Calcium: 8 mg/dL — ABNORMAL LOW (ref 8.9–10.3)
Chloride: 100 mmol/L (ref 98–111)
Chloride: 98 mmol/L (ref 98–111)
Creatinine, Ser: 1.44 mg/dL — ABNORMAL HIGH (ref 0.61–1.24)
Creatinine, Ser: 1.42 mg/dL — ABNORMAL HIGH (ref 0.61–1.24)
GFR, Estimated: >60 mL/min (ref >60)
GFR, Estimated: >60 mL/min (ref >60)
Glucose, Bld: 168 mg/dL — ABNORMAL HIGH (ref 70–99)
Glucose, Bld: 177 mg/dL — ABNORMAL HIGH (ref 70–99)
Potassium: 4.8 mmol/L (ref 3.5–5.1)
Potassium: 4.6 mmol/L (ref 3.5–5.1)
Sodium: 132 mmol/L — ABNORMAL LOW (ref 135–145)
Sodium: 131 mmol/L — ABNORMAL LOW (ref 135–145)

## 2024-01-11 LAB — TYPE AND SCREEN
ABO/RH(D): O POS
Antibody Screen: NEGATIVE
Unit division: 0
Unit division: 0
Unit division: 0
Unit division: 0

## 2024-01-11 LAB — POCT I-STAT, CHEM 8
BUN: 26 mg/dL — ABNORMAL HIGH (ref 6–20)
BUN: 25 mg/dL — ABNORMAL HIGH (ref 6–20)
BUN: 23 mg/dL — ABNORMAL HIGH (ref 6–20)
BUN: 24 mg/dL — ABNORMAL HIGH (ref 6–20)
BUN: 23 mg/dL — ABNORMAL HIGH (ref 6–20)
BUN: 22 mg/dL — ABNORMAL HIGH (ref 6–20)
BUN: 22 mg/dL — ABNORMAL HIGH (ref 6–20)
Calcium, Ion: 1.22 mmol/L (ref 1.15–1.40)
Calcium, Ion: 1.06 mmol/L — ABNORMAL LOW (ref 1.15–1.40)
Calcium, Ion: 1.08 mmol/L — ABNORMAL LOW (ref 1.15–1.40)
Calcium, Ion: 1.1 mmol/L — ABNORMAL LOW (ref 1.15–1.40)
Calcium, Ion: 1.03 mmol/L — ABNORMAL LOW (ref 1.15–1.40)
Calcium, Ion: 1.02 mmol/L — ABNORMAL LOW (ref 1.15–1.40)
Calcium, Ion: 1.05 mmol/L — ABNORMAL LOW (ref 1.15–1.40)
Chloride: 97 mmol/L — ABNORMAL LOW (ref 98–111)
Chloride: 97 mmol/L — ABNORMAL LOW (ref 98–111)
Chloride: 96 mmol/L — ABNORMAL LOW (ref 98–111)
Chloride: 96 mmol/L — ABNORMAL LOW (ref 98–111)
Chloride: 95 mmol/L — ABNORMAL LOW (ref 98–111)
Chloride: 94 mmol/L — ABNORMAL LOW (ref 98–111)
Chloride: 96 mmol/L — ABNORMAL LOW (ref 98–111)
Creatinine, Ser: 1.4 mg/dL — ABNORMAL HIGH (ref 0.61–1.24)
Creatinine, Ser: 1.4 mg/dL — ABNORMAL HIGH (ref 0.61–1.24)
Creatinine, Ser: 1.4 mg/dL — ABNORMAL HIGH (ref 0.61–1.24)
Creatinine, Ser: 1.4 mg/dL — ABNORMAL HIGH (ref 0.61–1.24)
Creatinine, Ser: 1.4 mg/dL — ABNORMAL HIGH (ref 0.61–1.24)
Creatinine, Ser: 1.3 mg/dL — ABNORMAL HIGH (ref 0.61–1.24)
Creatinine, Ser: 1.3 mg/dL — ABNORMAL HIGH (ref 0.61–1.24)
Glucose, Bld: 99 mg/dL (ref 70–99)
Glucose, Bld: 111 mg/dL — ABNORMAL HIGH (ref 70–99)
Glucose, Bld: 107 mg/dL — ABNORMAL HIGH (ref 70–99)
Glucose, Bld: 135 mg/dL — ABNORMAL HIGH (ref 70–99)
Glucose, Bld: 169 mg/dL — ABNORMAL HIGH (ref 70–99)
Glucose, Bld: 193 mg/dL — ABNORMAL HIGH (ref 70–99)
Glucose, Bld: 173 mg/dL — ABNORMAL HIGH (ref 70–99)
HCT: 47 % (ref 39.0–52.0)
HCT: 40 % (ref 39.0–52.0)
HCT: 35 % — ABNORMAL LOW (ref 39.0–52.0)
HCT: 35 % — ABNORMAL LOW (ref 39.0–52.0)
HCT: 33 % — ABNORMAL LOW (ref 39.0–52.0)
HCT: 31 % — ABNORMAL LOW (ref 39.0–52.0)
HCT: 32 % — ABNORMAL LOW (ref 39.0–52.0)
Hemoglobin: 16 g/dL (ref 13.0–17.0)
Hemoglobin: 13.6 g/dL (ref 13.0–17.0)
Hemoglobin: 11.9 g/dL — ABNORMAL LOW (ref 13.0–17.0)
Hemoglobin: 11.9 g/dL — ABNORMAL LOW (ref 13.0–17.0)
Hemoglobin: 11.2 g/dL — ABNORMAL LOW (ref 13.0–17.0)
Hemoglobin: 10.5 g/dL — ABNORMAL LOW (ref 13.0–17.0)
Hemoglobin: 10.9 g/dL — ABNORMAL LOW (ref 13.0–17.0)
Potassium: 4.6 mmol/L (ref 3.5–5.1)
Potassium: 4.1 mmol/L (ref 3.5–5.1)
Potassium: 4 mmol/L (ref 3.5–5.1)
Potassium: 4.3 mmol/L (ref 3.5–5.1)
Potassium: 4.4 mmol/L (ref 3.5–5.1)
Potassium: 4.1 mmol/L (ref 3.5–5.1)
Potassium: 4.6 mmol/L (ref 3.5–5.1)
Sodium: 134 mmol/L — ABNORMAL LOW (ref 135–145)
Sodium: 134 mmol/L — ABNORMAL LOW (ref 135–145)
Sodium: 134 mmol/L — ABNORMAL LOW (ref 135–145)
Sodium: 135 mmol/L (ref 135–145)
Sodium: 133 mmol/L — ABNORMAL LOW (ref 135–145)
Sodium: 134 mmol/L — ABNORMAL LOW (ref 135–145)
Sodium: 134 mmol/L — ABNORMAL LOW (ref 135–145)
TCO2: 27 mmol/L (ref 22–32)
TCO2: 26 mmol/L (ref 22–32)
TCO2: 26 mmol/L (ref 22–32)
TCO2: 29 mmol/L (ref 22–32)
TCO2: 26 mmol/L (ref 22–32)
TCO2: 26 mmol/L (ref 22–32)
TCO2: 24 mmol/L (ref 22–32)

## 2024-01-11 LAB — BPAM RBC
Blood Product Expiration Date: 202511242359
Blood Product Expiration Date: 202511242359
Blood Product Expiration Date: 202511242359
Blood Product Unit Number: 202511242359
ISSUE DATE / TIME: 202510300749
ISSUE DATE / TIME: 202510300749
ISSUE DATE / TIME: 202510300749
PRODUCT CODE: 202510300749
PRODUCT CODE: 202511242359
Unit Type and Rh: 202511242359
Unit Type and Rh: 5100
Unit Type and Rh: 5100
Unit Type and Rh: 5100
Unit Type and Rh: 5100
Unit Type and Rh: 5100

## 2024-01-11 LAB — PROTIME-INR
INR: 1.3 — ABNORMAL HIGH (ref 0.8–1.2)
INR: 1.2 (ref 0.8–1.2)
Prothrombin Time: 17 s — ABNORMAL HIGH (ref 11.4–15.2)
Prothrombin Time: 16.3 s — ABNORMAL HIGH (ref 11.4–15.2)

## 2024-01-11 LAB — COOXEMETRY PANEL
Carboxyhemoglobin: 1.1 % (ref 0.5–1.5)
Methemoglobin: 0.7 % (ref 0.0–1.5)
O2 Saturation: 72.7 %
Total hemoglobin: 10.9 g/dL — ABNORMAL LOW (ref 12.0–16.0)

## 2024-01-11 LAB — HEMOGLOBIN AND HEMATOCRIT, BLOOD
HCT: 31.5 % — ABNORMAL LOW (ref 39.0–52.0)
Hemoglobin: 10.1 g/dL — ABNORMAL LOW (ref 13.0–17.0)

## 2024-01-11 LAB — PLATELET COUNT: Platelets: 154 K/uL (ref 150–400)

## 2024-01-11 LAB — DIC (DISSEMINATED INTRAVASCULAR COAGULATION)PANEL
D-Dimer, Quant: 0.38 ug{FEU}/mL (ref 0.00–0.50)
Fibrinogen: 270 mg/dL (ref 210–475)
INR: 1.2 (ref 0.8–1.2)
Platelets: 138 K/uL — ABNORMAL LOW (ref 150–400)
Prothrombin Time: 15.5 s — ABNORMAL HIGH (ref 11.4–15.2)
Smear Review: NONE SEEN
aPTT: 33 s (ref 24–36)

## 2024-01-11 LAB — POCT I-STAT EG7
Acid-Base Excess: 4 mmol/L — ABNORMAL HIGH (ref 0.0–2.0)
Bicarbonate: 29.9 mmol/L — ABNORMAL HIGH (ref 20.0–28.0)
Calcium, Ion: 1.24 mmol/L (ref 1.15–1.40)
HCT: 45 % (ref 39.0–52.0)
Hemoglobin: 15.3 g/dL (ref 13.0–17.0)
O2 Saturation: 54 %
Potassium: 4.5 mmol/L (ref 3.5–5.1)
Sodium: 134 mmol/L — ABNORMAL LOW (ref 135–145)
TCO2: 31 mmol/L (ref 22–32)
pCO2, Ven: 50.4 mmHg (ref 44–60)
pH, Ven: 7.381 (ref 7.25–7.43)
pO2, Ven: 30 mmHg — CL (ref 32–45)

## 2024-01-11 LAB — CBC
HCT: 33.2 % — ABNORMAL LOW (ref 39.0–52.0)
HCT: 32.9 % — ABNORMAL LOW (ref 39.0–52.0)
Hemoglobin: 10.7 g/dL — ABNORMAL LOW (ref 13.0–17.0)
Hemoglobin: 10.6 g/dL — ABNORMAL LOW (ref 13.0–17.0)
MCH: 25.8 pg — ABNORMAL LOW (ref 26.0–34.0)
MCH: 26.1 pg (ref 26.0–34.0)
MCHC: 32.2 g/dL (ref 30.0–36.0)
MCHC: 32.2 g/dL (ref 30.0–36.0)
MCV: 80.2 fL (ref 80.0–100.0)
MCV: 81 fL (ref 80.0–100.0)
Platelets: 130 K/uL — ABNORMAL LOW (ref 150–400)
Platelets: 128 K/uL — ABNORMAL LOW (ref 150–400)
RBC: 4.14 MIL/uL — ABNORMAL LOW (ref 4.22–5.81)
RBC: 4.06 MIL/uL — ABNORMAL LOW (ref 4.22–5.81)
RDW: 19.7 % — ABNORMAL HIGH (ref 11.5–15.5)
RDW: 19.8 % — ABNORMAL HIGH (ref 11.5–15.5)
WBC: 16.5 K/uL — ABNORMAL HIGH (ref 4.0–10.5)
WBC: 13.1 K/uL — ABNORMAL HIGH (ref 4.0–10.5)
nRBC: 0 % (ref 0.0–0.2)
nRBC: 0 % (ref 0.0–0.2)

## 2024-01-11 LAB — MAGNESIUM
Magnesium: 1.8 mg/dL (ref 1.7–2.4)
Magnesium: 2.5 mg/dL — ABNORMAL HIGH (ref 1.7–2.4)

## 2024-01-11 LAB — PREPARE RBC (CROSSMATCH)

## 2024-01-11 LAB — APTT: aPTT: 35 s (ref 24–36)

## 2024-01-11 LAB — FIBRINOGEN: Fibrinogen: 264 mg/dL (ref 210–475)

## 2024-01-11 SURGERY — INSERTION OF IMPLANTABLE LEFT VENTRICULAR ASSIST DEVICE
Anesthesia: General

## 2024-01-11 MED ORDER — LACTATED RINGERS IV SOLN
500.0000 mL | Freq: Once | INTRAVENOUS | Status: AC | PRN
  Administered 2024-01-12: 500 mL via INTRAVENOUS

## 2024-01-11 MED ORDER — ROCURONIUM BROMIDE 10 MG/ML (PF) SYRINGE
PREFILLED_SYRINGE | INTRAVENOUS | Status: AC
  Filled 2024-01-11: qty 10

## 2024-01-11 MED ORDER — PROPOFOL 10 MG/ML IV BOLUS
INTRAVENOUS | Status: DC | PRN
  Administered 2024-01-11 (×2): 20 mg via INTRAVENOUS
  Administered 2024-01-11 (×2): 50 mg via INTRAVENOUS

## 2024-01-11 MED ORDER — VASOPRESSIN 20 UNIT/ML IV SOLN
INTRAVENOUS | Status: AC
  Filled 2024-01-11: qty 1

## 2024-01-11 MED ORDER — CHLORHEXIDINE GLUCONATE 0.12 % MT SOLN
15.0000 mL | OROMUCOSAL | Status: AC
  Administered 2024-01-11: 15 mL via OROMUCOSAL
  Filled 2024-01-11: qty 15

## 2024-01-11 MED ORDER — THROMBIN (RECOMBINANT) 20000 UNITS EX SOLR
CUTANEOUS | Status: DC | PRN
  Administered 2024-01-11: 20000 [IU] via TOPICAL

## 2024-01-11 MED ORDER — HEPARIN SODIUM (PORCINE) 1000 UNIT/ML IJ SOLN
INTRAMUSCULAR | Status: AC
  Filled 2024-01-11: qty 1

## 2024-01-11 MED ORDER — LACTATED RINGERS IV SOLN: INTRAVENOUS | Status: AC

## 2024-01-11 MED ORDER — FENTANYL CITRATE (PF) 250 MCG/5ML IJ SOLN
INTRAMUSCULAR | Status: AC
  Filled 2024-01-11: qty 5

## 2024-01-11 MED ORDER — SODIUM CHLORIDE 0.9% IV SOLUTION: Freq: Once | INTRAVENOUS | Status: DC

## 2024-01-11 MED ORDER — SODIUM CHLORIDE 0.9 % IV SOLN: INTRAVENOUS | Status: AC

## 2024-01-11 MED ORDER — HEMOSTATIC AGENTS (NO CHARGE) OPTIME
TOPICAL | Status: DC | PRN
  Administered 2024-01-11: 1 via TOPICAL

## 2024-01-11 MED ORDER — ACETAMINOPHEN 650 MG RE SUPP: 650.0000 mg | Freq: Once | RECTAL | Status: AC

## 2024-01-11 MED ORDER — ACETAMINOPHEN 160 MG/5ML PO SOLN
1000.0000 mg | Freq: Four times a day (QID) | ORAL | Status: AC
  Administered 2024-01-12 (×3): 1000 mg
  Filled 2024-01-11 (×4): qty 40.6

## 2024-01-11 MED ORDER — FLUCONAZOLE IN SODIUM CHLORIDE 400-0.9 MG/200ML-% IV SOLN
400.0000 mg | Freq: Once | INTRAVENOUS | Status: AC
  Administered 2024-01-12: 400 mg via INTRAVENOUS
  Filled 2024-01-11: qty 200

## 2024-01-11 MED ORDER — MIDAZOLAM HCL (PF) 5 MG/ML IJ SOLN
INTRAMUSCULAR | Status: DC | PRN
  Administered 2024-01-11 (×2): 2 mg via INTRAVENOUS
  Administered 2024-01-11: 1 mg via INTRAVENOUS
  Administered 2024-01-11 (×3): 2 mg via INTRAVENOUS
  Administered 2024-01-11: 1 mg via INTRAVENOUS

## 2024-01-11 MED ORDER — GABAPENTIN 250 MG/5ML PO SOLN
200.0000 mg | Freq: Every day | ORAL | Status: DC
  Administered 2024-01-11: 200 mg
  Filled 2024-01-11 (×2): qty 4

## 2024-01-11 MED ORDER — VANCOMYCIN HCL IN DEXTROSE 1-5 GM/200ML-% IV SOLN
1000.0000 mg | Freq: Two times a day (BID) | INTRAVENOUS | Status: AC
  Administered 2024-01-11 – 2024-01-12 (×3): 1000 mg via INTRAVENOUS
  Filled 2024-01-11 (×3): qty 200

## 2024-01-11 MED ORDER — FENTANYL CITRATE (PF) 250 MCG/5ML IJ SOLN
INTRAMUSCULAR | Status: DC | PRN
  Administered 2024-01-11 (×3): 100 ug via INTRAVENOUS
  Administered 2024-01-11 (×3): 50 ug via INTRAVENOUS
  Administered 2024-01-11 (×2): 100 ug via INTRAVENOUS

## 2024-01-11 MED ORDER — PROPOFOL 1000 MG/100ML IV EMUL: 0.0000 ug/kg/min | INTRAVENOUS | Status: DC

## 2024-01-11 MED ORDER — SODIUM CHLORIDE 0.9% FLUSH
3.0000 mL | Freq: Two times a day (BID) | INTRAVENOUS | Status: DC
  Administered 2024-01-12 – 2024-01-24 (×21): 3 mL via INTRAVENOUS

## 2024-01-11 MED ORDER — AMIODARONE HCL IN DEXTROSE 360-4.14 MG/200ML-% IV SOLN
30.0000 mg/h | INTRAVENOUS | Status: DC
  Administered 2024-01-11 – 2024-01-14 (×10): 60 mg/h via INTRAVENOUS
  Administered 2024-01-14: 30 mg/h via INTRAVENOUS
  Administered 2024-01-14: 60 mg/h via INTRAVENOUS
  Administered 2024-01-15 – 2024-01-18 (×8): 30 mg/h via INTRAVENOUS
  Filled 2024-01-11 (×11): qty 200
  Filled 2024-01-11: qty 400
  Filled 2024-01-11 (×7): qty 200

## 2024-01-11 MED ORDER — FAMOTIDINE IN NACL 20-0.9 MG/50ML-% IV SOLN
20.0000 mg | Freq: Two times a day (BID) | INTRAVENOUS | Status: AC
  Administered 2024-01-11 (×2): 20 mg via INTRAVENOUS
  Filled 2024-01-11 (×2): qty 50

## 2024-01-11 MED ORDER — ALBUMIN HUMAN 5 % IV SOLN: INTRAVENOUS | Status: DC | PRN

## 2024-01-11 MED ORDER — ROCURONIUM BROMIDE 10 MG/ML (PF) SYRINGE
PREFILLED_SYRINGE | INTRAVENOUS | Status: DC | PRN
  Administered 2024-01-11: 50 mg via INTRAVENOUS
  Administered 2024-01-11: 100 mg via INTRAVENOUS
  Administered 2024-01-11: 50 mg via INTRAVENOUS
  Administered 2024-01-11: 30 mg via INTRAVENOUS
  Administered 2024-01-11: 50 mg via INTRAVENOUS

## 2024-01-11 MED ORDER — ORAL CARE MOUTH RINSE: 15.0000 mL | Freq: Once | OROMUCOSAL | Status: DC

## 2024-01-11 MED ORDER — CHLORHEXIDINE GLUCONATE CLOTH 2 % EX PADS
6.0000 | MEDICATED_PAD | Freq: Every day | CUTANEOUS | Status: DC
  Administered 2024-01-11 – 2024-01-24 (×14): 6 via TOPICAL

## 2024-01-11 MED ORDER — OXYCODONE HCL 5 MG PO TABS
5.0000 mg | ORAL_TABLET | ORAL | Status: DC | PRN
  Administered 2024-01-12 – 2024-01-15 (×18): 10 mg via ORAL
  Filled 2024-01-11 (×18): qty 2

## 2024-01-11 MED ORDER — CALCIUM CHLORIDE 10 % IV SOLN
INTRAVENOUS | Status: AC
  Filled 2024-01-11: qty 10

## 2024-01-11 MED ORDER — NOREPINEPHRINE 4 MG/250ML-% IV SOLN: 0.0000 ug/min | INTRAVENOUS | Status: DC

## 2024-01-11 MED ORDER — MIDAZOLAM HCL (PF) 10 MG/2ML IJ SOLN
INTRAMUSCULAR | Status: AC
Start: 2024-01-11 — End: 2024-01-11
  Filled 2024-01-11: qty 2

## 2024-01-11 MED ORDER — EPINEPHRINE HCL 5 MG/250ML IV SOLN IN NS
0.0000 ug/min | INTRAVENOUS | Status: DC
  Administered 2024-01-12: 2 ug/min via INTRAVENOUS
  Filled 2024-01-11: qty 250

## 2024-01-11 MED ORDER — THROMBIN 20000 UNITS EX SOLR: OROMUCOSAL | Status: DC | PRN

## 2024-01-11 MED ORDER — EPHEDRINE 5 MG/ML INJ
INTRAVENOUS | Status: AC
  Filled 2024-01-11: qty 5

## 2024-01-11 MED ORDER — PROTAMINE SULFATE 10 MG/ML IV SOLN
INTRAVENOUS | Status: AC
  Filled 2024-01-11: qty 50

## 2024-01-11 MED ORDER — SODIUM CHLORIDE 0.45 % IV SOLN: INTRAVENOUS | Status: AC | PRN

## 2024-01-11 MED ORDER — HEPARIN 6000 UNIT IRRIGATION SOLUTION
Status: DC | PRN
  Administered 2024-01-11: 1

## 2024-01-11 MED ORDER — DEXMEDETOMIDINE HCL IN NACL 400 MCG/100ML IV SOLN: 0.0000 ug/kg/h | INTRAVENOUS | Status: DC

## 2024-01-11 MED ORDER — TRANEXAMIC ACID 1000 MG/10ML IV SOLN
1.5000 mg/kg/h | INTRAVENOUS | Status: DC
  Filled 2024-01-11: qty 25

## 2024-01-11 MED ORDER — MAGNESIUM SULFATE 4 GM/100ML IV SOLN
4.0000 g | Freq: Once | INTRAVENOUS | Status: AC
  Administered 2024-01-11: 4 g via INTRAVENOUS
  Filled 2024-01-11: qty 100

## 2024-01-11 MED ORDER — BISACODYL 5 MG PO TBEC
10.0000 mg | DELAYED_RELEASE_TABLET | Freq: Every day | ORAL | Status: DC
  Administered 2024-01-12 – 2024-01-18 (×6): 10 mg via ORAL
  Filled 2024-01-11 (×8): qty 2

## 2024-01-11 MED ORDER — THROMBIN (RECOMBINANT) 20000 UNITS EX SOLR
CUTANEOUS | Status: AC
  Filled 2024-01-11: qty 20000

## 2024-01-11 MED ORDER — DEXMEDETOMIDINE HCL IN NACL 400 MCG/100ML IV SOLN
0.0000 ug/kg/h | INTRAVENOUS | Status: DC
  Administered 2024-01-11 (×2): 1.1 ug/kg/h via INTRAVENOUS
  Administered 2024-01-11: 0.8 ug/kg/h via INTRAVENOUS
  Administered 2024-01-12 (×2): 1.2 ug/kg/h via INTRAVENOUS
  Administered 2024-01-12: 1.1 ug/kg/h via INTRAVENOUS
  Administered 2024-01-12: 1.2 ug/kg/h via INTRAVENOUS
  Administered 2024-01-12: 0.8 ug/kg/h via INTRAVENOUS
  Filled 2024-01-11: qty 200
  Filled 2024-01-11: qty 100
  Filled 2024-01-11: qty 200
  Filled 2024-01-11: qty 100
  Filled 2024-01-11: qty 200

## 2024-01-11 MED ORDER — VASOPRESSIN 20 UNITS/100 ML INFUSION FOR SHOCK
0.0400 [IU]/min | INTRAVENOUS | Status: DC
  Administered 2024-01-11: 0.02 [IU]/min via INTRAVENOUS
  Filled 2024-01-11: qty 100

## 2024-01-11 MED ORDER — PHENYLEPHRINE 80 MCG/ML (10ML) SYRINGE FOR IV PUSH (FOR BLOOD PRESSURE SUPPORT)
PREFILLED_SYRINGE | INTRAVENOUS | Status: AC
  Filled 2024-01-11: qty 10

## 2024-01-11 MED ORDER — LACTATED RINGERS IV SOLN: INTRAVENOUS | Status: DC | PRN

## 2024-01-11 MED ORDER — PANTOPRAZOLE SODIUM 40 MG PO TBEC
40.0000 mg | DELAYED_RELEASE_TABLET | Freq: Every day | ORAL | Status: DC
  Administered 2024-01-13 – 2024-01-24 (×12): 40 mg via ORAL
  Filled 2024-01-11 (×12): qty 1

## 2024-01-11 MED ORDER — FENTANYL CITRATE (PF) 50 MCG/ML IJ SOSY
25.0000 ug | PREFILLED_SYRINGE | INTRAMUSCULAR | Status: DC | PRN
  Administered 2024-01-11 – 2024-01-12 (×3): 50 ug via INTRAVENOUS
  Filled 2024-01-11 (×4): qty 1

## 2024-01-11 MED ORDER — INSULIN REGULAR(HUMAN) IN NACL 100-0.9 UT/100ML-% IV SOLN
INTRAVENOUS | Status: DC
Start: 2024-01-11 — End: 2024-01-12
  Administered 2024-01-12: 4.4 [IU]/h via INTRAVENOUS
  Filled 2024-01-11: qty 100

## 2024-01-11 MED ORDER — BISACODYL 10 MG RE SUPP: 10.0000 mg | Freq: Every day | RECTAL | Status: DC

## 2024-01-11 MED ORDER — HEPARIN 6000 UNIT IRRIGATION SOLUTION
Status: AC
  Filled 2024-01-11: qty 500

## 2024-01-11 MED ORDER — MORPHINE SULFATE (PF) 2 MG/ML IV SOLN
1.0000 mg | INTRAVENOUS | Status: DC | PRN
  Administered 2024-01-11 – 2024-01-12 (×3): 4 mg via INTRAVENOUS
  Administered 2024-01-12: 2 mg via INTRAVENOUS
  Administered 2024-01-13: 4 mg via INTRAVENOUS
  Administered 2024-01-13: 2 mg via INTRAVENOUS
  Administered 2024-01-13 – 2024-01-14 (×3): 4 mg via INTRAVENOUS
  Administered 2024-01-20: 2 mg via INTRAVENOUS
  Filled 2024-01-11: qty 2
  Filled 2024-01-11: qty 1
  Filled 2024-01-11 (×3): qty 2
  Filled 2024-01-11: qty 1
  Filled 2024-01-11 (×3): qty 2
  Filled 2024-01-11: qty 1

## 2024-01-11 MED ORDER — MIDAZOLAM HCL 2 MG/2ML IJ SOLN
INTRAMUSCULAR | Status: AC
  Filled 2024-01-11: qty 2

## 2024-01-11 MED ORDER — MEXILETINE HCL 150 MG PO CAPS
150.0000 mg | ORAL_CAPSULE | Freq: Three times a day (TID) | ORAL | Status: DC
  Administered 2024-01-12 – 2024-01-24 (×37): 150 mg via ORAL
  Filled 2024-01-11 (×40): qty 1

## 2024-01-11 MED ORDER — ALBUMIN HUMAN 5 % IV SOLN
250.0000 mL | INTRAVENOUS | Status: AC | PRN
  Administered 2024-01-11 (×3): 12.5 g via INTRAVENOUS
  Filled 2024-01-11: qty 250

## 2024-01-11 MED ORDER — ACETAMINOPHEN 160 MG/5ML PO SOLN
650.0000 mg | Freq: Once | ORAL | Status: AC
  Administered 2024-01-11: 650 mg
  Filled 2024-01-11: qty 20.3

## 2024-01-11 MED ORDER — ACETAMINOPHEN 500 MG PO TABS
1000.0000 mg | ORAL_TABLET | Freq: Four times a day (QID) | ORAL | Status: AC
  Administered 2024-01-12 – 2024-01-16 (×17): 1000 mg via ORAL
  Filled 2024-01-11 (×17): qty 2

## 2024-01-11 MED ORDER — LACTATED RINGERS IV SOLN: INTRAVENOUS | Status: DC

## 2024-01-11 MED ORDER — CEFAZOLIN SODIUM-DEXTROSE 2-4 GM/100ML-% IV SOLN
2.0000 g | Freq: Three times a day (TID) | INTRAVENOUS | Status: AC
  Administered 2024-01-11 – 2024-01-13 (×6): 2 g via INTRAVENOUS
  Filled 2024-01-11 (×6): qty 100

## 2024-01-11 MED ORDER — SODIUM CHLORIDE 0.9 % IV SOLN: INTRAVENOUS | Status: DC | PRN

## 2024-01-11 MED ORDER — SODIUM CHLORIDE 0.9 % IV SOLN: 250.0000 mL | INTRAVENOUS | Status: AC

## 2024-01-11 MED ORDER — PROPOFOL 500 MG/50ML IV EMUL
INTRAVENOUS | Status: DC | PRN
  Administered 2024-01-11: 25 ug/kg/min via INTRAVENOUS

## 2024-01-11 MED ORDER — TRAMADOL HCL 50 MG PO TABS
50.0000 mg | ORAL_TABLET | ORAL | Status: DC | PRN
  Administered 2024-01-12 – 2024-01-18 (×11): 100 mg via ORAL
  Filled 2024-01-11 (×11): qty 2

## 2024-01-11 MED ORDER — MILRINONE LACTATE IN DEXTROSE 20-5 MG/100ML-% IV SOLN
0.1250 ug/kg/min | INTRAVENOUS | Status: DC
  Administered 2024-01-11 – 2024-01-14 (×9): 0.375 ug/kg/min via INTRAVENOUS
  Administered 2024-01-14: 0.25 ug/kg/min via INTRAVENOUS
  Administered 2024-01-14: 0.375 ug/kg/min via INTRAVENOUS
  Administered 2024-01-15 – 2024-01-17 (×6): 0.25 ug/kg/min via INTRAVENOUS
  Filled 2024-01-11 (×19): qty 100

## 2024-01-11 MED ORDER — DOCUSATE SODIUM 100 MG PO CAPS
200.0000 mg | ORAL_CAPSULE | Freq: Every day | ORAL | Status: DC
  Administered 2024-01-12 – 2024-01-18 (×7): 200 mg via ORAL
  Filled 2024-01-11 (×9): qty 2

## 2024-01-11 MED ORDER — PROPOFOL 1000 MG/100ML IV EMUL
INTRAVENOUS | Status: AC
  Filled 2024-01-11: qty 100

## 2024-01-11 MED ORDER — PROTAMINE SULFATE 10 MG/ML IV SOLN
INTRAVENOUS | Status: DC | PRN
  Administered 2024-01-11: 500 mg via INTRAVENOUS

## 2024-01-11 MED ORDER — SODIUM CHLORIDE 0.9 % IV SOLN
600.0000 mg | Freq: Once | INTRAVENOUS | Status: AC
  Administered 2024-01-12: 600 mg via INTRAVENOUS
  Filled 2024-01-11: qty 10

## 2024-01-11 MED ORDER — MIDAZOLAM HCL (PF) 2 MG/2ML IJ SOLN
2.0000 mg | INTRAMUSCULAR | Status: DC | PRN
  Administered 2024-01-11 – 2024-01-12 (×6): 2 mg via INTRAVENOUS
  Filled 2024-01-11 (×6): qty 2

## 2024-01-11 MED ORDER — HEPARIN SODIUM (PORCINE) 1000 UNIT/ML IJ SOLN
INTRAMUSCULAR | Status: DC | PRN
  Administered 2024-01-11: 10000 [IU] via INTRAVENOUS
  Administered 2024-01-11: 50000 [IU] via INTRAVENOUS

## 2024-01-11 MED ORDER — CALCIUM CHLORIDE 10 % IV SOLN
INTRAVENOUS | Status: DC | PRN
Start: 2024-01-11 — End: 2024-01-11
  Administered 2024-01-11: 200 mg via INTRAVENOUS

## 2024-01-11 MED ORDER — AMIODARONE HCL IN DEXTROSE 360-4.14 MG/200ML-% IV SOLN
INTRAVENOUS | Status: DC | PRN
  Administered 2024-01-11: 60 mg/h via INTRAVENOUS

## 2024-01-11 MED ORDER — ONDANSETRON HCL 4 MG/2ML IJ SOLN: 4.0000 mg | Freq: Four times a day (QID) | INTRAMUSCULAR | Status: DC | PRN

## 2024-01-11 MED ORDER — CHLORHEXIDINE GLUCONATE 0.12 % MT SOLN: 15.0000 mL | Freq: Once | OROMUCOSAL | Status: DC

## 2024-01-11 MED ORDER — DEXTROSE 50 % IV SOLN: 0.0000 mL | INTRAVENOUS | Status: DC | PRN

## 2024-01-11 MED ORDER — LIDOCAINE 2% (20 MG/ML) 5 ML SYRINGE
INTRAMUSCULAR | Status: AC
  Filled 2024-01-11: qty 5

## 2024-01-11 MED ORDER — VANCOMYCIN HCL 1000 MG IV SOLR
INTRAVENOUS | Status: AC
  Filled 2024-01-11: qty 20

## 2024-01-11 MED ORDER — PROPOFOL 10 MG/ML IV BOLUS
INTRAVENOUS | Status: AC
  Filled 2024-01-11: qty 20

## 2024-01-11 MED ORDER — MILRINONE LACTATE IN DEXTROSE 20-5 MG/100ML-% IV SOLN
0.3000 ug/kg/min | INTRAVENOUS | Status: DC
  Filled 2024-01-11: qty 100

## 2024-01-11 MED ORDER — SODIUM CHLORIDE 0.9% FLUSH: 3.0000 mL | INTRAVENOUS | Status: DC | PRN

## 2024-01-11 MED ORDER — POTASSIUM CHLORIDE 10 MEQ/50ML IV SOLN: 10.0000 meq | INTRAVENOUS | Status: AC

## 2024-01-11 MED ORDER — SODIUM CHLORIDE 0.9 % IV SOLN
20.0000 ug | Freq: Once | INTRAVENOUS | Status: AC
  Administered 2024-01-11: 20 ug via INTRAVENOUS
  Filled 2024-01-11: qty 5

## 2024-01-11 MED FILL — Heparin Sodium (Porcine) Inj 1000 Unit/ML: Qty: 1000 | Status: AC

## 2024-01-11 MED FILL — Magnesium Sulfate Inj 50%: INTRAMUSCULAR | Qty: 10 | Status: AC

## 2024-01-11 MED FILL — Potassium Chloride Inj 2 mEq/ML: INTRAVENOUS | Qty: 40 | Status: AC

## 2024-01-11 SURGICAL SUPPLY — 82 items
ADAPTER CARDIO PERF ANTE/RETRO (ADAPTER) IMPLANT
BAG DECANTER FOR FLEXI CONT (MISCELLANEOUS) ×1 IMPLANT
BLADE CLIPPER SURG (BLADE) ×1 IMPLANT
BLADE STERNUM SYSTEM 6 (BLADE) ×1 IMPLANT
BLADE SURG 15 STRL LF DISP TIS (BLADE) IMPLANT
CANISTER SUCTION 3000ML PPV (SUCTIONS) ×1 IMPLANT
CANNULA AORTIC ROOT 9FR (CANNULA) IMPLANT
CANNULA MC2 2 STG 36/46 NON-V (CANNULA) ×1 IMPLANT
CANNULA NON VENT 20FR 12 (CANNULA) ×1 IMPLANT
CANNULA NON VENT 22FR 12 (CANNULA) IMPLANT
CATH FOLEY 2WAY SLVR 5CC 14FR (CATHETERS) ×1 IMPLANT
CATH HEART VENT LEFT (CATHETERS) IMPLANT
CATH ROBINSON RED A/P 18FR (CATHETERS) ×2 IMPLANT
CATH THOR RT ANG 28F 9128 SOFT (CATHETERS) ×1 IMPLANT
CATH THORACIC 28FR (CATHETERS) IMPLANT
CATH THORACIC 36FR (CATHETERS) ×1 IMPLANT
CONN ST 1/4X3/8 BEN (MISCELLANEOUS) IMPLANT
COVER BACK TABLE 60X90IN (DRAPES) IMPLANT
DRAIN CHANNEL 28F RND 3/8 FF (WOUND CARE) ×1 IMPLANT
DRAPE CV SPLIT W-CLR ANES SCRN (DRAPES) ×1 IMPLANT
DRAPE INCISE IOBAN 66X45 STRL (DRAPES) ×2 IMPLANT
DRAPE PERI GROIN 82X75IN TIB (DRAPES) ×1 IMPLANT
DRAPE WARM FLUID 44X44 (DRAPES) ×1 IMPLANT
DRESSING PEEL AND PLAC PRVNA20 (GAUZE/BANDAGES/DRESSINGS) IMPLANT
DRSG COVADERM 4X14 (GAUZE/BANDAGES/DRESSINGS) ×1 IMPLANT
ELECT BLADE 6.5 EXT (BLADE) ×1 IMPLANT
ELECT CAUTERY BLADE 6.4 (BLADE) ×1 IMPLANT
ELECTRODE BLDE 4.0 EZ CLN MEGD (MISCELLANEOUS) IMPLANT
ELECTRODE REM PT RTRN 9FT ADLT (ELECTROSURGICAL) ×2 IMPLANT
FELT TEFLON 6X6 (MISCELLANEOUS) ×2 IMPLANT
GAUZE SPONGE 4X4 12PLY STRL (GAUZE/BANDAGES/DRESSINGS) ×2 IMPLANT
GLOVE BIO SURGEON STRL SZ 6.5 (GLOVE) ×2 IMPLANT
GOWN STRL REUS W/ TWL LRG LVL3 (GOWN DISPOSABLE) ×4 IMPLANT
GOWN STRL REUS W/ TWL XL LVL3 (GOWN DISPOSABLE) ×2 IMPLANT
HEMOSTAT POWDER SURGIFOAM 1G (HEMOSTASIS) ×3 IMPLANT
HEMOSTAT SURGICEL 2X14 (HEMOSTASIS) ×1 IMPLANT
INSERT FOGARTY XLG (MISCELLANEOUS) IMPLANT
KIT BASIN OR (CUSTOM PROCEDURE TRAY) ×1 IMPLANT
KIT DRSG PREVENA PLUS 7DAY 125 (MISCELLANEOUS) IMPLANT
KIT LVAD HEARTMATE 3 W-CNTRL (Prosthesis & Implant Heart) IMPLANT
KIT SUCTION CATH 14FR (SUCTIONS) ×1 IMPLANT
KIT TURNOVER KIT B (KITS) ×1 IMPLANT
LEAD PACING MYOCARDI (MISCELLANEOUS) IMPLANT
LINE VENT (MISCELLANEOUS) IMPLANT
PACK OPEN HEART (CUSTOM PROCEDURE TRAY) ×1 IMPLANT
PAD ARMBOARD POSITIONER FOAM (MISCELLANEOUS) ×2 IMPLANT
PENCIL BUTTON HOLSTER BLD 10FT (ELECTRODE) ×1 IMPLANT
POSITIONER HEAD DONUT 9IN (MISCELLANEOUS) ×1 IMPLANT
PUNCH AORTIC ROTATE 4.5MM 8IN (MISCELLANEOUS) ×1 IMPLANT
SEALANT HEMOST PREVELEAK 4ML (HEMOSTASIS) IMPLANT
SET MPS 3-ND DEL (MISCELLANEOUS) IMPLANT
SOLN 0.9% NACL POUR BTL 1000ML (IV SOLUTION) ×5 IMPLANT
SOLN STERILE WATER BTL 1000 ML (IV SOLUTION) ×2 IMPLANT
SPONGE T-LAP 4X18 ~~LOC~~+RFID (SPONGE) IMPLANT
SUT BONE WAX W31G (SUTURE) ×1 IMPLANT
SUT ETHIBOND 2 0 SH 36X2 (SUTURE) ×5 IMPLANT
SUT ETHIBOND NAB MH 2-0 36IN (SUTURE) ×12 IMPLANT
SUT ETHIBOND X763 2 0 SH 1 (SUTURE) ×4 IMPLANT
SUT ETHILON 2 0 FS 18 (SUTURE) IMPLANT
SUT PROLENE 4 0 SH DA (SUTURE) ×1 IMPLANT
SUT PROLENE 4-0 RB1 .5 CRCL 36 (SUTURE) ×4 IMPLANT
SUT PROLENE 5 0 C 1 36 (SUTURE) IMPLANT
SUT SILK 1 MH (SUTURE) ×4 IMPLANT
SUT SILK 1 TIES 10X30 (SUTURE) ×1 IMPLANT
SUT SILK 2 0 SH CR/8 (SUTURE) ×2 IMPLANT
SUT SILK 2 0SH CR/8 30 (SUTURE) ×1 IMPLANT
SUT STEEL 6MS V (SUTURE) IMPLANT
SUT STEEL SZ 6 DBL 3X14 BALL (SUTURE) IMPLANT
SUT VIC AB 1 CTX 27 (SUTURE) ×1 IMPLANT
SUT VIC AB 1 CTX36XBRD ANBCTR (SUTURE) IMPLANT
SUT VIC AB 2-0 CT1 TAPERPNT 27 (SUTURE) ×1 IMPLANT
SUT VIC AB 3-0 X1 27 (SUTURE) IMPLANT
SYR 50ML LL SCALE MARK (SYRINGE) ×1 IMPLANT
SYSTEM SAHARA CHEST DRAIN ATS (WOUND CARE) ×1 IMPLANT
TAPE CLOTH SURG 4X10 WHT LF (GAUZE/BANDAGES/DRESSINGS) IMPLANT
TAPE PAPER 2X10 WHT MICROPORE (GAUZE/BANDAGES/DRESSINGS) IMPLANT
TOWEL GREEN STERILE (TOWEL DISPOSABLE) ×1 IMPLANT
TRAY FOLEY SLVR 16FR TEMP STAT (SET/KITS/TRAYS/PACK) ×1 IMPLANT
TUBE CONNECTING 12X1/4 (SUCTIONS) ×1 IMPLANT
TUBE SUCT INTRACARD DLP 20F (MISCELLANEOUS) ×1 IMPLANT
UNDERPAD 30X36 HEAVY ABSORB (UNDERPADS AND DIAPERS) ×1 IMPLANT
YANKAUER SUCT BULB TIP NO VENT (SUCTIONS) ×1 IMPLANT

## 2024-01-11 NOTE — Progress Notes (Signed)
 LVAD alarm signaling power cable disconnect. All cables checked with 2 ither RN's, alarm satified when cable connected to monitor was manipulated. 2138 Notified Isaiah, LVAD Coordinator of alarms and trouble shot reason for alarms. Noted of cable were ti be come a problem again to obtain extra cable from 2C.  2213 IPAD frose on history loading screen, notified VAD Coordinator Isaiah, who walked RN through steps to restart and reconnect IPAD.

## 2024-01-11 NOTE — Plan of Care (Signed)
  Problem: Clinical Measurements: Goal: Diagnostic test results will improve Outcome: Progressing Goal: Respiratory complications will improve Outcome: Progressing Goal: Cardiovascular complication will be avoided Outcome: Progressing   Problem: Pain Managment: Goal: General experience of comfort will improve and/or be controlled Outcome: Progressing   Problem: Safety: Goal: Ability to remain free from injury will improve Outcome: Progressing   Problem: Skin Integrity: Goal: Risk for impaired skin integrity will decrease Outcome: Progressing

## 2024-01-11 NOTE — Anesthesia Procedure Notes (Signed)
 Central Venous Catheter Insertion Performed by: Keneth Lynwood POUR, MD, anesthesiologist Start/End10/30/2025 7:30 AM, 01/11/2024 7:45 AM Patient location: Pre-op. Preanesthetic checklist: patient identified, IV checked, site marked, risks and benefits discussed, surgical consent, monitors and equipment checked, pre-op evaluation, timeout performed and anesthesia consent Lidocaine  1% used for infiltration and patient sedated Hand hygiene performed  and maximum sterile barriers used  Catheter size: 8.5 Fr Total catheter length 80. PA cath was placed.MAC introducer PA Cath depth:50 Procedure performed using ultrasound to evaluate access site. Ultrasound Notes:relevant anatomy identified, ultrasound used to visualize needle entry, vessel patent under ultrasound and image(s) printed for medical record. Attempts: 1 Following insertion, line sutured and dressing applied. Post procedure assessment: blood return through all ports, free fluid flow and no air  Patient tolerated the procedure well with no immediate complications.

## 2024-01-11 NOTE — Anesthesia Postprocedure Evaluation (Signed)
 Anesthesia Post Note  [name]  Procedure(s) Performed: INSERTION OF IMPLANTABLE LEFT VENTRICULAR ASSIST DEVICE USING HEARTMATE 3 ECHOCARDIOGRAM, TRANSESOPHAGEAL, INTRAOPERATIVE     Patient location during evaluation: SICU Anesthesia Type: General Level of consciousness: sedated Pain management: pain level controlled Vital Signs Assessment: post-procedure vital signs reviewed and stable Respiratory status: patient remains intubated per anesthesia plan and patient on ventilator - see flowsheet for VS Cardiovascular status: stable Postop Assessment: no apparent nausea or vomiting Anesthetic complications: no   No notable events documented.  Last Vitals:  Vitals:   01/11/24 1615 01/11/24 1630  BP:    Pulse: (!) 153 (!) 171  Resp: 18 (!) 21  Temp: 36.7 C 36.7 C  SpO2: 99% 99%    Last Pain:  Vitals:   01/11/24 0630  TempSrc: Oral  PainSc:                  [name]

## 2024-01-11 NOTE — Anesthesia Preprocedure Evaluation (Signed)
 Anesthesia Evaluation  Patient identified by MRN, date of birth, ID band Patient awake    Reviewed: Allergy & Precautions, NPO status , Patient's Chart, lab work & pertinent test results, reviewed documented beta blocker date and time   History of Anesthesia Complications Negative for: history of anesthetic complications  Airway Mallampati: III  TM Distance: >3 FB     Dental no notable dental hx.    Pulmonary shortness of breath, asthma , neg COPD, former smoker   breath sounds clear to auscultation       Cardiovascular hypertension, + CAD, + Past MI and +CHF  + dysrhythmias  Rhythm:Regular Rate:Normal + Systolic murmurs IMPRESSIONS     1. Left ventricular ejection fraction, by estimation, is <20%. The left  ventricle has severely decreased function. The left ventricle demonstrates  global hypokinesis. The left ventricular internal cavity size was severely  dilated. Indeterminate  diastolic filling due to E-A fusion.   2. Right ventricular systolic function is low normal. The right  ventricular size is moderately enlarged.   3. Left atrial size was severely dilated.   4. Right atrial size was moderately dilated.   5. The mitral valve is normal in structure. Moderate mitral valve  regurgitation. No evidence of mitral stenosis.   6. The aortic valve is grossly normal. Aortic valve regurgitation is not  visualized. No aortic stenosis is present.   7. The inferior vena cava is dilated in size with <50% respiratory  variability, suggesting right atrial pressure of 15 mmHg.      Neuro/Psych neg Seizures    GI/Hepatic ,neg GERD  ,,(+) neg Cirrhosis        Endo/Other  diabetes    Renal/GU CRFRenal disease     Musculoskeletal   Abdominal   Peds  Hematology  (+) Blood dyscrasia, anemia   Anesthesia Other Findings   Reproductive/Obstetrics                              Anesthesia  Physical Anesthesia Plan  ASA: 4  Anesthesia Plan: General   Post-op Pain Management:    Induction: Intravenous  PONV Risk Score and Plan: 2 and Ondansetron   Airway Management Planned: Oral ETT and Video Laryngoscope Planned  Additional Equipment: Arterial line, CVP, PA Cath, TEE and Ultrasound Guidance Line Placement  Intra-op Plan:   Post-operative Plan: Extubation in OR  Informed Consent: I have reviewed the patients History and Physical, chart, labs and discussed the procedure including the risks, benefits and alternatives for the proposed anesthesia with the patient or authorized representative who has indicated his/her understanding and acceptance.     Dental advisory given  Plan Discussed with: CRNA  Anesthesia Plan Comments:          Anesthesia Quick Evaluation

## 2024-01-11 NOTE — Consult Note (Signed)
 NAME:  Philip Monroe, MRN:  [id number], DOB:  Jun 25, 1975, LOS: 6 ADMISSION DATE:  01/05/2024, CONSULTATION DATE: 10/30  REFERRING MD:  MD Su CHIEF COMPLAINT:  LVAD  History of Present Illness:  Patient is a 48 year old male with significant past medical history of chronic HFrEF < 20%, CAD multivessel (recent [facility] 5/24), followed in past by [facility] Cards for iCM/noncompaction CM, pulmonary HTN, HTN, CKD stage III, diabetes type 2, HLD, hx of ETOH use, multiple recent admission of CHF exacerbations- most recent in 9/25 who was admitted on 01/05/24 for HF optimization, and also teeth extraction x 4 (occurred on 10/27) for LVAD surgery on 01/11/24 by MD Su with TCTS. PCCM consulted to assist with post-op vent management as well as critical care needs during ICU stay.   Estimated blood loss 800 mL  4 of FFP given Protamine given for heparin  reversal DDAVP  Pertinent  Medical History   Past Medical History:  Diagnosis Date   Aortic root dilatation    a. 07/2022 Echo: Ao root 4.4cm, Asc Ao 4.0cm.   Asthma    as a child, no problems as an adult, rarely uses inhaler   CAD (coronary artery disease)    a. 08/2017 Cath: LM nl, LAD 46m/d, D1 70ost, LCX 11m, OM1 90 inf branch, 60 sup branch, OM2 small, RCA 30 diffuse, RPDA 100 w/ L->R collats; b. 07/2022 Echo: LM nl, LAD 59m, D1 80, LCX 133m, OM1 80, RCA 2m, RPDA 100-->No targets for PCI/CABG-->Med rx.   CHF (congestive heart failure) (HCC)    takes lasix    Chronic HFrEF (heart failure with reduced ejection fraction) (HCC)    a. 03/2015 Echo: EF 35-40%; b. 08/2017 Echo: EF 15%; c. 08/2017 cMRI: EF 35%; d. 06/2018 Echo: EF 10-15%; e. 07/2022 Echo: EF 15-20%, grIII DD, mild MR, sev dil LA, mod-sev PH, Ao root 4.4cm, Asc Ao 4.0cm; f. 07/2022 RHC: RA 4, RV 38/3, PA 43/18 (26), PCWP 11. CO/CI 5.3/1.8.   CKD (chronic kidney disease), stage II    Diabetes mellitus without complication (HCC)    type 2   Dyspnea    with exertion   Gout    Hyperlipidemia LDL goal  <70    Hypertension    Ischemic cardiomyopathy    a. 03/2015 Echo: EF 35-40%; b. 08/2017 Echo: EF 15%; c. 08/2017 cMRI: EF 35%; d. 06/2018 Echo: EF 10-15%; e. 07/2022 Echo: EF 15-20%, grIII DD.   IVCD (intraventricular conduction defect)    Left ventricular noncompaction [facility])    Morbid obesity (HCC)    NSVT (nonsustained ventricular tachycardia) (HCC)    a. 09/2022 Event monitor: predominantly sinus rhythm, avg HR 81 (46-167). 17 runs NSVT (fastest/longest 13 beats x 167). Rare PACs, 17.5% PVC burden.   PVC's (premature ventricular contractions)    a. 09/2022 Event Monitor: 17.5% PVC burden.   Statin intolerance    Tobacco abuse      Significant Hospital Events: Including procedures, antibiotic start and stop dates in addition to other pertinent events   10/24 Admit for HF optimization for LVAD surgery on 10/30 10/27 Teeth Extraction x 4 by oral surgery  10/30 HM3 LVAD placement, PCCM consult for post-op management   Interim History / Subjective:  Postop 0 LVAD HM3  Intubated, sedated, paralyzed not reversed 4 chest tubes: Mediastinal x 2, pleural x 2-minimal output serosanguineous Epicardial wires Driveline in place  Wound VAC for sternotomy dressing  FiO2 50%, 10 of PEEP, RR 16 NO-20 ppm Having PVCs off-pump but PVCs  not new  LVAD RPMs 5500, flow 5.4, power 4.1 and power 2.9  CVP 7  Cooximetry pending  Patient currently receiving Levophed at 2 mcg, vaso 0.03, epi 2 mcg, Amio 60 mg, milrinone  0.375 mcg  On Precedex, propofol for sedation  Objective    Blood pressure 125/85, pulse (!) 43, temperature 98.1 F (36.7 C), temperature source Oral, resp. rate 16, height 6' 3 (1.905 m), weight (!) 144.7 kg, SpO2 99%. PAP: (20-39)/(-6-28) 36/10      Intake/Output Summary (Last 24 hours) at 01/11/2024 1149 Last data filed at 01/11/2024 1138 Gross per 24 hour  Intake 2867.48 ml  Output 850 ml  Net 2017.48 ml   Filed Weights   01/10/24 0332 01/11/24 0400 01/11/24 0630   Weight: (!) 144.7 kg (!) 145.1 kg (!) 144.7 kg    Examination: General: Acute on chronic critically ill adult male, lying in ICU bed on vent postop LVAD HM 3, in NAD HENT: Normocephalic, pupils 2 mm B/L-paralyzed, pink MMM, ETT, OG, missing teeth  Lungs: Clear, diminished, on vent 50% FiO2 Cardiovascular: S1, S2, RRR-PVCs, epicardial wires in place, chest tubes x 2-mediastinal/pleural of each, wound VAC dressing over sternotomy site clean dry and intact, driveline dressing clean dry and intact Abdomen: Bowel sounds active, obese Extremities: Generalized edema, no deformity, paralyzed Neuro: RASS -5, paralyzed GU: Foley intact making urine  Resolved problem list   Assessment and Plan  LVAD HM3  Chronic HFrEF  Multivessel CAD  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 Diffuse hypokinesis with akinesis of the mid inferior and inferolateral walls, LV EF 14%. Prominent trabeculations but not quite reaching the MRI definition of noncompaction-(noncompacted/compacted radio 2.3). evidence for prior myocardial infarction in the mid inferior/mid inferolateral walls, suspect this area is nonviable. There is mid-wall LGE in the basal septum, suggesting a possible nonischemic cardiomyopathic process. RHC on 10/24- moderate reduced CO with low filling pressures, Cardiac Index 1.9 P: Postoperative management per TCTS Postoperative care per TCTS CT management per protocol Wean pressors for map goal >60-80 Complete surgical ppx abx/antifungals  Complete TXA  Continue to monitor driveline for bleeding and infection - Wean pressors as tolerated-currently on Levophed, epi, vasopressin Continue mexiletine per tube Continue milrinone  Continue Amio Continue cardiac monitoring continuously Trend CVP, coox Continue insulin  drip, CBGs  Q1 per Endo tool Continue multimodal pain control with bowel regimen  Keep NO at 20 ppm, do not titrate overnight will begin to wean tomorrow.  Also will continue to keep patient intubated overnight and begin to perform SBT/WA tomorrow on 10/31 Send postop labs, check ABG, check coags, LDH, CBC check chest x-ray Follow and utilize multimodal pain regimen as ordered by TCTS  Acute respiratory insufficiency, postop vent secondary to above History of childhood asthma Postop ABG pH 7.338, pCO2 45.8, PaO2 127, bicarb 24.6 P:  Continue ventilator support and lung protective strategies  Continue LTVV  Wean PEEP and Fio2 requirements to sat goal of >92%  HOB > 30 degrees Plat < 30  Aim for Driving pressures < 15  Intermittent Chest X-ray and ABGS VAP and PAD protocols in place  Fentanyl  PRN 25-50 mcg, prn to meet RASS goal 0 to -1  Wean sedation as tolerated, SBT and WUA daily-Will begin tomorrow morning on 10/31, keep intubated overnight per TCTS, continue propofol, wean off Precedex Also begin to wean NO 10/31 Can utilize  albuterol  as needed  Leukocytosis? Reactive? Afebrile  11.8 on 10/28, 14.2 on 10/29  CBC Pending P: Follow up with WBC, and trend   CKD Stage III Baseline Cr 1.2-1.3  P: Continue to trend renal function daily  Continue to monitor and optimize electrolytes daily Continue to monitor urine output Continue strict I/Os Continue Adequate renal perfusion  Avoid nephrotoxic agents   HTN P: Hold antihypertensives at this time   Elevated BMI-39.87  HLD P: Continue statin  Diet education when medically stable   Expected perioperative blood loss anemia Thrombocytopenia due to CPB Did not receive DDAVP, FFP x 4, protamine for heparin  reversal P: Monitor H/H and PLT counts Monitor chest tube output Monitor for signs of bleeding Transfuse for hemoglobin less than 7 Pending postop labs-CBC f/u with   Teeth Extraction 10/27  Per oral surgery P: Continue  supportive care   Gout  On allopurinol , colchicine  P: Supportive Care  Hold allopurinol  and colchicine  for now   Labs   CBC: Recent Labs  Lab 01/05/24 1907 01/06/24 0339 01/07/24 0308 01/08/24 0250 01/09/24 0222 01/10/24 9770 01/11/24 9250 01/11/24 0940 01/11/24 0944 01/11/24 1029 01/11/24 1101 01/11/24 1113  WBC 7.6 7.8 8.5 9.5 11.8* 14.2*  --   --   --   --   --   --   NEUTROABS 3.1  --   --   --   --   --   --   --   --   --   --   --   HGB 13.7 13.8 13.5 13.6 14.8 14.3   < > 11.9* 11.9* 11.9* 11.2* 10.1*  HCT 43.6 43.0 42.2 42.7 46.7 44.0   < > 35.0* 35.0* 35.0* 33.0* 31.5*  MCV 80.3 79.9* 78.9* 79.7* 79.6* 79.6*  --   --   --   --   --   --   PLT 165 158 163 173 202 182  --   --   --   --   --  154   < > = values in this interval not displayed.    Basic Metabolic Panel: Recent Labs  Lab 01/06/24 0339 01/06/24 1834 01/07/24 0308 01/08/24 0250 01/09/24 0222 01/09/24 1039 01/10/24 0229 01/11/24 9250 01/11/24 9178 01/11/24 9170 01/11/24 9078 01/11/24 0940 01/11/24 0944 01/11/24 1029 01/11/24 1101  NA 134*   < > 136 134* 133* 133* 134*   < > 134*   < > 134* 134* 135 134* 133*  K 3.1*   < > 3.9 3.9 5.7* 5.2* 4.1   < > 4.6   < > 4.1 3.9 4.0 4.3 4.4  CL 101   < > 105 101 97* 98 93*  --  97*  --  97*  --  96* 96* 95*  CO2 23   < > 21* 22 27 27 29   --   --   --   --   --   --   --   --   GLUCOSE 102*   < > 103* 96 123* 110* 139*  --  99  --  111*  --  107* 135* 169*  BUN 17   < > 14 14 15 17  24*  --  26*  --  25*  --  23* 24* 23*  CREATININE 1.32*   < > 1.16 1.39* 1.51* 1.38* 1.59*  --  1.40*  --  1.40*  --  1.40* 1.40* 1.40*  CALCIUM 8.8*   < > 8.8* 8.7* 9.7  9.7 9.4  --   --   --   --   --   --   --   --   MG 1.9  --  2.2 1.6* 2.3  --  1.8  --   --   --   --   --   --   --   --    < > = values in this interval not displayed.   GFR: Estimated Creatinine Clearance: 100.2 mL/min (A) (by C-G formula based on SCr of 1.4 mg/dL (H)). Recent Labs  Lab  01/07/24 0308 01/08/24 0250 01/09/24 0222 01/10/24 0229  WBC 8.5 9.5 11.8* 14.2*    Liver Function Tests: Recent Labs  Lab 01/05/24 1907  AST 25  ALT 30  ALKPHOS 62  BILITOT 0.9  PROT 6.1*  ALBUMIN 3.3*   No results for input(s): LIPASE, AMYLASE in the last 168 hours. No results for input(s): AMMONIA in the last 168 hours.  ABG    Component Value Date/Time   PHART 7.385 01/11/2024 0940   PCO2ART 43.5 01/11/2024 0940   PO2ART 290 (H) 01/11/2024 0940   HCO3 26.0 01/11/2024 0940   TCO2 26 01/11/2024 1101   ACIDBASEDEF 3.0 (H) 01/05/2024 1326   ACIDBASEDEF 1.0 01/05/2024 1326   O2SAT 100 01/11/2024 0940     Coagulation Profile: No results for input(s): INR, PROTIME in the last 168 hours.  Cardiac Enzymes: No results for input(s): CKTOTAL, CKMB, CKMBINDEX, TROPONINI in the last 168 hours.  HbA1C: Hgb A1c MFr Bld  Date/Time Value Ref Range Status  11/29/2023 04:45 AM 6.6 (H) 4.8 - 5.6 % Final    Comment:    (NOTE) Diagnosis of Diabetes The following HbA1c ranges recommended by the American Diabetes Association (ADA) may be used as an aid in the diagnosis of diabetes mellitus.  Hemoglobin             Suggested A1C NGSP%              Diagnosis  <5.7                   Non Diabetic  5.7-6.4                Pre-Diabetic  >6.4                   Diabetic  <7.0                   Glycemic control for                       adults with diabetes.      CBG: Recent Labs  Lab 01/08/24 1251  GLUCAP 155*    Review of Systems:   See HPI   Past Medical History:  He,  has a past medical history of Aortic root dilatation, Asthma, CAD (coronary artery disease), CHF (congestive heart failure) (HCC), Chronic HFrEF (heart failure with reduced ejection fraction) (HCC), CKD (chronic kidney disease), stage II, Diabetes mellitus without complication (HCC), Dyspnea, Gout, Hyperlipidemia LDL goal <70, Hypertension, Ischemic cardiomyopathy, IVCD  (intraventricular conduction defect), Left ventricular noncompaction (HCC), Morbid obesity (HCC), NSVT (nonsustained ventricular tachycardia) (HCC), PVC's (premature ventricular contractions), Statin intolerance, and Tobacco abuse.   Surgical History:   Past Surgical History:  Procedure Laterality Date   RIGHT HEART CATH N/A 01/05/2024   Procedure: RIGHT HEART CATH;  Surgeon: Cherrie Toribio SAUNDERS, MD;  Location: [facility] INVASIVE CV LAB;  Service:  Cardiovascular;  Laterality: N/A;   RIGHT/LEFT HEART CATH AND CORONARY ANGIOGRAPHY N/A 11/28/2023   Procedure: RIGHT/LEFT HEART CATH AND CORONARY ANGIOGRAPHY;  Surgeon: Rolan Ezra RAMAN, MD;  Location: [facility] INVASIVE CV LAB;  Service: Cardiovascular;  Laterality: N/A;   TOOTH EXTRACTION N/A 01/08/2024   Procedure: DENTAL RESTORATION/EXTRACTIONS;  Surgeon: Sheryle Hamilton, DMD;  Location: [facility] OR;  Service: Oral Surgery;  Laterality: N/A;     Social History:   reports that he quit smoking about 8 years ago. His smoking use included cigarettes. He has never used smokeless tobacco. He reports that he does not currently use alcohol after a past usage of about 36.0 standard drinks of alcohol per week. He reports that he does not use drugs.   Family History:  His family history is not on file.   Allergies Allergies  Allergen Reactions   Lactose Intolerance (Gi) Diarrhea   Statins     myalgias     Home Medications  Prior to Admission medications   Medication Sig Start Date End Date Taking? Authorizing Provider  allopurinol  (ZYLOPRIM ) 100 MG tablet Take 1 tablet (100 mg total) by mouth daily. 12/21/23 03/20/24 Yes Su, Con RAMAN, MD  colchicine  0.6 MG tablet Take 1 tablet (0.6 mg total) by mouth 2 (two) times daily. 12/08/23  Yes Rolan Ezra RAMAN, MD  digoxin  (LANOXIN ) 0.125 MG tablet Take 1 tablet (0.125 mg total) by mouth daily. 12/05/23  Yes Fausto Sor A, DO  empagliflozin  (JARDIANCE ) 10 MG TABS tablet Take 1 tablet (10 mg total) by mouth daily. 12/04/23  Yes  Fausto Sor A, DO  ezetimibe  (ZETIA ) 10 MG tablet Take 1 tablet (10 mg total) by mouth at bedtime. 12/04/23 03/03/24 Yes Fausto Sor A, DO  mexiletine (MEXITIL ) 150 MG capsule Take 2 capsules (300 mg total) by mouth 2 (two) times daily. 12/04/23  Yes Fausto Sor A, DO  sacubitril -valsartan  (ENTRESTO ) 24-26 MG Take 1 tablet by mouth 2 (two) times daily. 12/25/23  Yes Bensimhon, Toribio SAUNDERS, MD  spironolactone  (ALDACTONE ) 25 MG tablet Take 1 tablet (25 mg total) by mouth daily. 12/04/23  Yes Fausto Sor A, DO  torsemide  (DEMADEX ) 20 MG tablet Take 2 tablets (40 mg total) by mouth daily. 12/04/23  Yes Fausto Sor A, DO  albuterol  (VENTOLIN  HFA) 108 (90 Base) MCG/ACT inhaler Inhale 1-2 puffs into the lungs every 6 (six) hours as needed for wheezing or shortness of breath.    [provider]  ELIQUIS  5 MG TABS tablet Take 1 tablet (5 mg total) by mouth 2 (two) times daily. Patient not taking: Reported on 01/04/2024 12/04/23   Fausto Sor LABOR, DO     Critical care time: 50 mins     Christian Ramiro Pangilinan AGACNP-BC   [facility] 01/11/2024, 12:15 PM  Please see [email or web address] for pager details.  From 7A-7P if no response, please call [phone]. After hours, please call ELink [phone].

## 2024-01-11 NOTE — Progress Notes (Signed)
 H&V Care Navigation CSW Progress Note  Outpatient Heart Failure CSW attempted to contact pt daughter Darryle x2 but phone goes straight to notification that I am unable to leave VM.  LVAD Coordinator confirmed they have been able to speak with daughter throughout the day and no needs at this time.  No family at bedside or in waiting room.  CSW will continue to follow patient during implant stay and assist as needed.  Andriette HILARIO Leech, LCSW Clinical Social Worker [facility] Desk#: [phone] Cell#: [phone]

## 2024-01-11 NOTE — Op Note (Signed)
 CARDIOVASCULAR SURGERY OPERATIVE NOTE  Philip Monroe [id number] 01/11/2024   Surgeon:  Con Clunes, MD  First Assistant: Dorise Fellers, MD   Experienced assistance was necessary for this case due to surgical complexity. Dr. Fellers assisted with retraction of delicate tissues, exposure, suctioning, and suture management during the LVAD implantation.   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   Implantation of HeartMate 3 Left Ventricular Assist Device   Anesthesia:  General Endotracheal   Clinical History/Surgical Indication:  Philip Monroe is a 48 y.o. 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  He has been admitted in March, July and September with CHF exacerbation.  Most recently, he was treated with milrinone  and diuresis.  He was able to be discharged home after being weaned off milrinone .  He now presents for LVAD implantation for end-stage heart failure  The operative procedure has been discussed with the patient and family including alternatives, benefits and risks; including but not limited to bleeding, blood transfusion, infection, stroke, myocardial infarction, pump failure or thrombus possibly requiring replacement, RV dysfunction requiring an RVAD or long term milrinone  therapy, heart block requiring a permanent pacemaker, organ dysfunction, and death.  The patient understands and agrees to proceed.      Preparation:  The patient was seen in the preoperative holding area and the correct patient, correct operation were confirmed with the patient after reviewing the medical record and catheterization. The consent was signed by me. Preoperative antibiotics were given. A pulmonary arterial line and radial arterial line were placed by the anesthesia team. The patient was taken back to the operating room and positioned supine on  the operating room table. After being placed under general endotracheal anesthesia by the anesthesia team a foley catheter was placed. The neck, chest, abdomen, and both legs were prepped with betadine soap and solution and draped in the usual sterile manner. A surgical time-out was taken and the correct patient and operative procedure were confirmed with the nursing and anesthesia staff.   Pre-bypass TEE:   Complete TEE assessment was performed by Rockey Cornea, MD. This showed a tiny PFO, no AI, moderate MR, mild TR, severely decreased LV function, normal RV function.  Median sternotomy:    A median sternotomy was performed. The pericardium was opened in the midline. Right ventricular function appeared moderately depressed with mild distension. The ascending aorta was of small size and had no palpable plaque. There were no contraindications to aortic cannulation or cross-clamping. The pericardium was opened along the left diaphragm out to the apex. The left diaphragm was taken down from the anterior chest wall over a short distance medially using electrocautery. Then a 4 cm transverse incision was made to the left of the umbilicus and continued down to the rectus fascia using electrocautery. A skin punch was used to create the drive line exit site about 3 cm below the right costal margin in the anterior axillary line. The straight tunneling device was passed in a subcutaneous plane from the transverse abdominal incision to a point midway to the pericardium and then through the rectus fascia into the pre-peritoneal space. Care was taken to make sure that the peritoneum was separated from the posterior rectus fascia at this location and the entrance site of the trocar into the preperitoneal space was observed while passing the trocar to prevent intraabdominal injury. The curved tunneling device was passed  from the abdominal incision the the skin exit site.  Cardiopulmonary Bypass:   The patient was fully  systemically heparinized and the ACT was maintained > 400 sec. The proximal aortic arch was cannulated with a 22 F aortic cannula for arterial inflow. Venous cannulation was performed via the right atrial appendage using a two-staged venous cannula. Carbon dioxide was insufflated into the pericardium at 6L/min throughout the procedure to minimize intracardiac air. The systemic temperature was allowed to drift downward slightly during the procedure and the patient was actively rewarmed.   Insertion of apical outflow cannula:  The patient was placed on cardiopulmonary bypass. Laparotomy pads were placed behind the heart to aid exposure of the apex. A site was chosen on the anterior wall lateral to the LAD and superior to the true apex. A stab incision was made and a foley catheter placed through the coring knife and into the left ventricle. The balloon was inflated and with gentle traction on the catheter the coring knife was carefully advanced toward the mitral valve to create the ventriculotomy. The balloon was deflated and removed. A sump was placed into the LV and the ventricular core excised and sent to pathology. Trabeculations that might cause obstruction were excised. Then a series of 2-0 Ethibond horizontal mattress sutures were placed around the ventriculotomy. This took 12 sutures. The sutures were placed through the apical cuff. The sutures were tied and the apical cuff appeared to be well-sealed to the apex. Coseal was applied to seal needle holes. Then volume was left in the heart and the lungs inflated. The HeartMate 3 was brought onto the operative field. The inflow cannula was inserted into the apical cuff and the apical cuff lock engaged.  The outflow end of the pump was connected to vent suction and the heart and pump deaired by filling the heart and ventilating the lungs. With the LV distended the apical cuff appeared hemostatic. The drive line was then tunneled using the previously placed  tunneling devices. The black line on the graft was oriented along the margin of the pericardium to prevent twisting of the graft. The pump was turned into the appropriate position. The heart and pump were returned to the pericardium and appeared to sit nicely.   Aortic outflow graft anastomosis:  The outflow graft was distended and positioned along the inferior margin of the heart and around the right atrium. The outflow graft was cut to the appropriate length. The ascending aorta was partially occluded with a Lemole clamp. A slightly diagonal midline aortotomy was created and the ends enlarged with a 4.5 mm punch. The outflow graft was anastomosed to the aorta using 4-0 prolene pledgetted sutures at the toe and heal that were run down each side. [place] was used to seal the needle holes in the graft. The Lemole clamp was removed and the graft clamped with a peripheral Debakey clamp and deaired using a 21 gauge needle. The aortic anastomosis was hemostatic.   Weaning from bypass:  The patient was started on Milrinone  0.375 mcg/kg/min, epinephrine 2 mcg/kg/min, levophed at 4 mcg/kg/min and nitric oxide at 20 ppm. The HeartMate 3 pump was started at 3000 rpm. TEE confirmed that the intracardiac air was removed.  Cardiopulmonary bypass flow was decreased to half flow and the clamp removed from the outflow graft. The speed was gradually increased to 4000 rpm. Flow was decreased to 1/4 and the speed gradually increased to 4800 rpm. Cardiopulmonary bypass was discontinued and the speed increased to 5500 rpm.  Pulmonary artery pressures were normal at 30s/20s with a CVP of 9-12. CO was 9 L/min. TEE showed excellent apical cannula position. There was mild MR, mild TR. RV function appeared normal with a midline septum.  Completion:  Two temporary epicardial pacing wires were placed on the right ventricle. Heparin  was fully reversed with protamine and the aortic and venous cannulas removed. Hemostasis was  achieved. Mediastinal drainage tubes and a bilateral pleural chest tubes were placed.  The sternum was closed with double #6 stainless steel wires. The fascia was closed with continuous # 1 vicryl suture. The subcutaneous tissue was closed with 2-0 vicryl continuous suture. The skin was closed with 3-0 vicryl subcuticular suture. The abdominal incision was closed with continuous 3-0 vicryl subcutaneous suture and 3-0 vicryl subcuticular skin suture. The drive line exit site was re-approximated  2-0 nylon skin sutures. The two drive line fasteners were applied. All sponge, needle, and instrument counts were reported correct at the end of the case. Dry sterile dressings were placed over the incisions and around the chest tubes which were connected to pleurevac suction. The patient was then transported to the surgical intensive care unit in critical but stable condition.

## 2024-01-11 NOTE — Transfer of Care (Signed)
 Immediate Anesthesia Transfer of Care Note  [name]  Procedure(s) Performed: INSERTION OF IMPLANTABLE LEFT VENTRICULAR ASSIST DEVICE USING HEARTMATE 3 ECHOCARDIOGRAM, TRANSESOPHAGEAL, INTRAOPERATIVE  Patient Location: ICU  Anesthesia Type:General  Level of Consciousness: sedated and Patient remains intubated per anesthesia plan  Airway & Oxygen Therapy: Patient remains intubated per anesthesia plan and Patient placed on Ventilator (see vital sign flow sheet for setting)  Post-op Assessment: Report given to RN and Post -op Vital signs reviewed and stable  Post vital signs: Reviewed and stable  Last Vitals:  Vitals Value Taken Time  BP 72/60  67 01/11/24   1450  Temp    Pulse 145 01/11/24 14:50  Resp 20 01/11/24 14:50  SpO2 96 % 01/11/24 14:50  Vitals shown include unfiled device data.  Last Pain:  Vitals:   01/11/24 0630  TempSrc: Oral  PainSc:       Patients Stated Pain Goal: 0 (01/10/24 1950)  Complications: No notable events documented.

## 2024-01-11 NOTE — Progress Notes (Signed)
 Brief Progress Note  Doing well this morning, no issues overnight. Weight stable. No leg swelling  Vitals:   01/11/24 0406 01/11/24 0630  BP: (!) 143/83 125/85  Pulse: 99 85  Resp: 18 17  Temp: 98.2 F (36.8 C) 98.1 F (36.7 C)  SpO2: 100% 96%   Plan:  Proceed today with HM3 implantation  Con Clunes, MD Cardiothoracic Surgery Pager: [phone]

## 2024-01-11 NOTE — Anesthesia Procedure Notes (Signed)
 Arterial Line Insertion Start/End10/30/2025 7:12 AM, 01/11/2024 7:15 AM Performed by: Claudene Arlin LABOR, CRNA, CRNA  Patient location: Pre-op. Preanesthetic checklist: patient identified, IV checked, site marked, risks and benefits discussed, surgical consent, monitors and equipment checked, pre-op evaluation, timeout performed and anesthesia consent Lidocaine  1% used for infiltration Left, radial was placed Catheter size: 20 G Hand hygiene performed  and maximum sterile barriers used   Attempts: 1 Procedure performed using ultrasound to evaluate access site. Ultrasound Notes:relevant anatomy identified, ultrasound used to visualize needle entry and vessel patent under ultrasound. Following insertion, dressing applied and Biopatch. Post procedure assessment: normal and unchanged

## 2024-01-11 NOTE — Anesthesia Procedure Notes (Addendum)
 Procedure Name: Intubation Date/Time: 01/11/2024 8:09 AM  Performed by: Claudene Arlin LABOR, CRNAPre-anesthesia Checklist: Patient identified, Emergency Drugs available, Suction available and Patient being monitored Patient Re-evaluated:Patient Re-evaluated prior to induction Oxygen Delivery Method: Circle System Utilized Preoxygenation: Pre-oxygenation with 100% oxygen Induction Type: IV induction Ventilation: Mask ventilation without difficulty Laryngoscope Size: Glidescope and 4 Grade View: Grade I Tube type: Oral Tube size: 7.5 mm Number of attempts: 1 Airway Equipment and Method: Stylet and Oral airway Placement Confirmation: ETT inserted through vocal cords under direct vision, positive ETCO2 and breath sounds checked- equal and bilateral Secured at: 23 cm Tube secured with: Tape Dental Injury: Teeth and Oropharynx as per pre-operative assessment

## 2024-01-11 NOTE — Progress Notes (Addendum)
 Advanced Heart Failure Rounding Note  Chief Complaint: s/p HM3 Implant Subjective:    POD#0 HM3 Implant  Received 4 FFP and 650 CS  Stable on Epi 2 + Milrinone  0.375 + Vaso 0.03 + Amio 60/hr + iNO 20 ppm  Frequent PVCs in OR and post-op, no VT.  Intubated/sedated  Objective:    Vital Signs:   Temp:  [97.7 F (36.5 C)-98.5 F (36.9 C)] 98.1 F (36.7 C) (10/30 0630) Pulse Rate:  [43-99] 43 (10/30 0733) Resp:  [10-18] 16 (10/30 0733) BP: (113-143)/(83-97) 125/85 (10/30 0630) SpO2:  [95 %-100 %] 98 % (10/30 1445) FiO2 (%):  [50 %] 50 % (10/30 1445) Weight:  [144.7 kg-145.1 kg] 144.7 kg (10/30 0630) Last BM Date : 01/10/24  Weight change: Filed Weights   01/10/24 0332 01/11/24 0400 01/11/24 0630  Weight: (!) 144.7 kg (!) 145.1 kg (!) 144.7 kg   Intake/Output:  Intake/Output Summary (Last 24 hours) at 01/11/2024 1502 Last data filed at 01/11/2024 1354 Gross per 24 hour  Intake 5935.48 ml  Output 2300 ml  Net 3635.48 ml    Physical Exam  General: Acutely-ill appearing. No distress  Cardiac: Mechanical heart sounds with LVAD hum present.  Resp: Lung sounds clear and equal B/L Abdomen: Soft, non-tender, non-distended.  Driveline: Dressing C/D/I. No drainage or redness. Anchor in place. Extremities: Warm and dry. No peripheral edema. Neuro: Sedated  LVAD Interrogation HM3: Speed: 5500  Flow: 5.4 PI: 2.1 Power: 4.1.  No PI events  Telemetry: SR 80s, frequent PVCs  Labs: Basic Metabolic Panel: Recent Labs  Lab 01/06/24 0339 01/06/24 1834 01/07/24 0308 01/08/24 0250 01/09/24 0222 01/09/24 1039 01/10/24 0229 01/11/24 0749 01/11/24 0944 01/11/24 1029 01/11/24 1101 01/11/24 1202 01/11/24 1207 01/11/24 1245 01/11/24 1329 01/11/24 1349  NA 134*   < > 136 134* 133* 133* 134*   < > 135 134* 133* 133* 134* 135 134* 134*  K 3.1*   < > 3.9 3.9 5.7* 5.2* 4.1   < > 4.0 4.3 4.4 4.1 4.1 4.2 4.6 4.4  CL 101   < > 105 101 97* 98 93*   < > 96* 96* 95*  --  94*  --   96*  --   CO2 23   < > 21* 22 27 27 29   --   --   --   --   --   --   --   --   --   GLUCOSE 102*   < > 103* 96 123* 110* 139*   < > 107* 135* 169*  --  193*  --  173*  --   BUN 17   < > 14 14 15 17  24*   < > 23* 24* 23*  --  22*  --  22*  --   CREATININE 1.32*   < > 1.16 1.39* 1.51* 1.38* 1.59*   < > 1.40* 1.40* 1.40*  --  1.30*  --  1.30*  --   CALCIUM 8.8*   < > 8.8* 8.7* 9.7 9.7 9.4  --   --   --   --   --   --   --   --   --   MG 1.9  --  2.2 1.6* 2.3  --  1.8  --   --   --   --   --   --   --   --   --    < > = values in this interval not  displayed.   Liver Function Tests: Recent Labs  Lab 01/05/24 1907  AST 25  ALT 30  ALKPHOS 62  BILITOT 0.9  PROT 6.1*  ALBUMIN 3.3*   No results for input(s): LIPASE, AMYLASE in the last 168 hours. No results for input(s): AMMONIA in the last 168 hours.  CBC: Recent Labs  Lab 01/05/24 1907 01/06/24 0339 01/07/24 0308 01/08/24 0250 01/09/24 0222 01/10/24 0229 01/11/24 0749 01/11/24 1113 01/11/24 1202 01/11/24 1207 01/11/24 1245 01/11/24 1329 01/11/24 1349  WBC 7.6 7.8 8.5 9.5 11.8* 14.2*  --   --   --   --   --   --   --   NEUTROABS 3.1  --   --   --   --   --   --   --   --   --   --   --   --   HGB 13.7 13.8 13.5 13.6 14.8 14.3   < > 10.1* 10.9* 10.5* 9.2* 10.9* 11.2*  HCT 43.6 43.0 42.2 42.7 46.7 44.0   < > 31.5* 32.0* 31.0* 27.0* 32.0* 33.0*  MCV 80.3 79.9* 78.9* 79.7* 79.6* 79.6*  --   --   --   --   --   --   --   PLT 165 158 163 173 202 182  --  154  --   --   --   --   --    < > = values in this interval not displayed.   BNP (last 3 results) Recent Labs    11/24/23 1618 12/25/23 1139 01/05/24 1907  BNP 1,700.4* 315.6* 674.8*   Medications:    Scheduled Medications:  [START ON 01/12/2024] acetaminophen   1,000 mg Oral Q6H   Or   [START ON 01/12/2024] acetaminophen  (TYLENOL ) oral liquid 160 mg/5 mL  1,000 mg Per Tube Q6H   acetaminophen  (TYLENOL ) oral liquid 160 mg/5 mL  650 mg Per Tube Once   Or    acetaminophen   650 mg Rectal Once   [START ON 01/12/2024] bisacodyl  10 mg Oral Daily   Or   [START ON 01/12/2024] bisacodyl  10 mg Rectal Daily   chlorhexidine   15 mL Mouth/Throat NOW   [START ON 01/12/2024] docusate sodium  200 mg Oral Daily   gabapentin  200 mg Per Tube QHS   [START ON 01/13/2024] pantoprazole  40 mg Oral Daily   [START ON 01/12/2024] sodium chloride  flush  3 mL Intravenous Q12H    Infusions:  sodium chloride      [START ON 01/12/2024] sodium chloride      sodium chloride      albumin human     amiodarone       ceFAZolin (ANCEF) IV     dexmedetomidine (PRECEDEX) IV infusion     epinephrine     famotidine (PEPCID) IV     [START ON 01/12/2024] fluconazole (DIFLUCAN) IV     insulin      lactated ringers     lactated ringers     lactated ringers     magnesium sulfate     milrinone      norepinephrine (LEVOPHED) Adult infusion     potassium chloride      propofol (DIPRIVAN) infusion     [START ON 01/12/2024] rifampin (RIFADIN) 600 mg in sodium chloride  0.9 % 100 mL IVPB     vancomycin     vasopressin      PRN Medications: sodium chloride , albumin human, dextrose , lactated ringers, midazolam  PF, morphine  injection, ondansetron  (ZOFRAN ) IV, oxyCODONE , [START  ON 01/12/2024] sodium chloride  flush, traMADol   Plan/Discussion:    1. Chronic systolic heart failure: - combined ischemic and nonischemic: -Mentions of compaction CM from [facility] cards notes. PVCs may also contribute, 25.3% PVCs with NSVT on Zio monitor in 5/25 - Echo 9/25 with EF < 20%, sev LV dilation, mod RV enlargement with low normal systolic function, mod MR - CMRI 9/25 LV EF 14%, RV EF 29%, suspect mixed ischemic/nonischemic cardiomyopathy with scarring process due to MI in the mid inferior/inferolateral wall and non-coronary type mid-wall LGE in the basal septum. - RHC 10/25 with low filling pressures and reduced CI (1.9 TD, 2.2 Fick). - NYHA class IV symptoms on admission.  - S/P HM3 LVAD Implant -  intra-op TEE with normal RV function (per anesthesia report) - received 4FFP, 650 CS intra-op - continue Epi + milrinone  0.375 + iNO 20ppm for RV support - on low dose NE and vaso, wean for MAP goal 60-80 - hold diuresis in immediate post-op - vent mgmt per CCM; plan to extubated in am - on post-op abx  2. LVAD - set speed 5500 - VAD parameters reviewed personally - INR 1.3, holding warfarin    3. CAD:   - LHC 5/24 at [facility]: severe multivessel CAD including 80% mid RCA, 100% RPDA, 100% mid LCx, 80% OM1, 80% mid-distal LAD.  - LHC 9/25: occluded PDA and mid AV LCx, 90% ramus, severe diffuse disease in right PLV, nonobstructive LAD disease. With markedly low EF and without severe LAD disease, CABG nor a durable option.  - No ASA given anticoagulation use and stable CAD.  - restart zetia  tomorrow  4. PVCs:  - Zio 5/25 with 25.3% (prev did not tolerate amiodarone  well, on mexiletine) - increased burden post-op - continue amio 60/hr for now - restart mexilitine tomorrow   Length of Stay: 6  CRITICAL CARE Performed by: Jordan Lee  Total critical care time: 12 minutes  -Critical care time was exclusive of separately billable procedures and treating other patients. -Critical care was necessary to treat or prevent imminent or life-threatening deterioration. -Critical care was time spent personally by me on the following activities: development of treatment plan with patient and/or surrogate as well as nursing, discussions with consultants, evaluation of patient's response to treatment, examination of patient, obtaining history from patient or surrogate, ordering and performing treatments and interventions, ordering and review of laboratory studies, ordering and review of radiographic studies, pulse oximetry and re-evaluation of patient's condition.  Jordan Lee, NP 01/11/2024, 3:02 PM  Advanced Heart Failure Team Pager [phone] (M-F; 7a - 4p)  Please contact [facility] for  night-coverage after hours (4p -7a ) and weekends on [email or web address]    Agree with above.   Patient seen on return from OR after HM-3 implant  Remains intubated/sedated.   On NE, VP + Epi 2 + milrinone  0.375 + iNO 20ppm   Minimal CT drainage. MAPs and VAD flows stable   General:  intubated/sedated HEENT: normal  + ETT Neck: supple. RIJ swan Cor: LVAD hum.  Lungs: Clear. Abdomen: obese soft, nontender, non-distended. No hepatosplenomegaly. No bruits or masses. Good bowel sounds. Driveline site clean. Anchor in place.  Extremities: no cyanosis, clubbing, rash. Warm no edema  Neuro: sedated   Wean drips as tolerated. Work toward extubation in am   VAD interrogated personally. Parameters stable.  D/w TCTS and CCM at bedside  CRITICAL CARE Performed by: Cherrie Sieving  Total critical care time: 41 minutes  Critical care time was exclusive  of separately billable procedures and treating other patients.  Critical care was necessary to treat or prevent imminent or life-threatening deterioration.  Critical care was time spent personally by me (independent of midlevel providers or residents) on the following activities: development of treatment plan with patient and/or surrogate as well as nursing, discussions with consultants, evaluation of patient's response to treatment, examination of patient, obtaining history from patient or surrogate, ordering and performing treatments and interventions, ordering and review of laboratory studies, ordering and review of radiographic studies, pulse oximetry and re-evaluation of patient's condition.  Toribio Fuel, MD  4:59 PM

## 2024-01-11 NOTE — Progress Notes (Signed)
 Helping ICU RT= Per discussion w/ CCM, okay to continue peep at 10 and rate at 16 until ABG can be assessed.    Pt was transported back from OR x 2 RT via vent and nitric w/ no apparent respiratory complications.

## 2024-01-12 ENCOUNTER — Inpatient Hospital Stay (HOSPITAL_COMMUNITY)

## 2024-01-12 ENCOUNTER — Encounter (HOSPITAL_COMMUNITY): Payer: Self-pay

## 2024-01-12 DIAGNOSIS — Z95811 Presence of heart assist device: Secondary | ICD-10-CM | POA: Diagnosis not present

## 2024-01-12 DIAGNOSIS — N182 Chronic kidney disease, stage 2 (mild): Secondary | ICD-10-CM

## 2024-01-12 DIAGNOSIS — I5022 Chronic systolic (congestive) heart failure: Secondary | ICD-10-CM | POA: Diagnosis not present

## 2024-01-12 DIAGNOSIS — I5023 Acute on chronic systolic (congestive) heart failure: Secondary | ICD-10-CM | POA: Diagnosis not present

## 2024-01-12 LAB — BASIC METABOLIC PANEL WITH GFR
Anion gap: 9 (ref 5–15)
BUN: 13 mg/dL (ref 6–20)
CO2: 24 mmol/L (ref 22–32)
Calcium: 8.4 mg/dL — ABNORMAL LOW (ref 8.9–10.3)
Chloride: 98 mmol/L (ref 98–111)
Creatinine, Ser: 1.36 mg/dL — ABNORMAL HIGH (ref 0.61–1.24)
GFR, Estimated: >60 mL/min (ref >60)
Glucose, Bld: 137 mg/dL — ABNORMAL HIGH (ref 70–99)
Potassium: 4.4 mmol/L (ref 3.5–5.1)
Sodium: 131 mmol/L — ABNORMAL LOW (ref 135–145)

## 2024-01-12 LAB — CBC WITH DIFFERENTIAL/PLATELET
Abs Immature Granulocytes: 0.09 K/uL — ABNORMAL HIGH (ref 0.00–0.07)
Basophils Absolute: 0 K/uL (ref 0.0–0.1)
Basophils Relative: 0 %
Eosinophils Absolute: 0 K/uL (ref 0.0–0.5)
Eosinophils Relative: 0 %
HCT: 33 % — ABNORMAL LOW (ref 39.0–52.0)
Hemoglobin: 10.9 g/dL — ABNORMAL LOW (ref 13.0–17.0)
Immature Granulocytes: 1 %
Lymphocytes Relative: 8 %
Lymphs Abs: 1.2 K/uL (ref 0.7–4.0)
MCH: 26.2 pg (ref 26.0–34.0)
MCHC: 33 g/dL (ref 30.0–36.0)
MCV: 79.3 fL — ABNORMAL LOW (ref 80.0–100.0)
Monocytes Absolute: 2.2 K/uL — ABNORMAL HIGH (ref 0.1–1.0)
Monocytes Relative: 15 %
Neutro Abs: 10.9 K/uL — ABNORMAL HIGH (ref 1.7–7.7)
Neutrophils Relative %: 76 %
Platelets: 123 K/uL — ABNORMAL LOW (ref 150–400)
RBC: 4.16 MIL/uL — ABNORMAL LOW (ref 4.22–5.81)
RDW: 20.1 % — ABNORMAL HIGH (ref 11.5–15.5)
WBC: 14.4 K/uL — ABNORMAL HIGH (ref 4.0–10.5)
nRBC: 0 % (ref 0.0–0.2)

## 2024-01-12 LAB — BPAM FFP
Blood Product Expiration Date: 202511042359
Blood Product Expiration Date: 202511042359
Blood Product Expiration Date: 202511042359
Blood Product Expiration Date: 202511042359
ISSUE DATE / TIME: 202510300847
ISSUE DATE / TIME: 202510300847
ISSUE DATE / TIME: 202510300847
ISSUE DATE / TIME: 202510300847
Unit Type and Rh: 5100
Unit Type and Rh: 5100
Unit Type and Rh: 5100
Unit Type and Rh: 5100

## 2024-01-12 LAB — POCT I-STAT 7, (LYTES, BLD GAS, ICA,H+H)
Acid-base deficit: 1 mmol/L (ref 0.0–2.0)
Bicarbonate: 23.8 mmol/L (ref 20.0–28.0)
Calcium, Ion: 1.2 mmol/L (ref 1.15–1.40)
HCT: 32 % — ABNORMAL LOW (ref 39.0–52.0)
Hemoglobin: 10.9 g/dL — ABNORMAL LOW (ref 13.0–17.0)
O2 Saturation: 97 %
Patient temperature: 37.4
Potassium: 5 mmol/L (ref 3.5–5.1)
Sodium: 133 mmol/L — ABNORMAL LOW (ref 135–145)
TCO2: 25 mmol/L (ref 22–32)
pCO2 arterial: 39.8 mmHg (ref 32–48)
pH, Arterial: 7.387 (ref 7.35–7.45)
pO2, Arterial: 97 mmHg (ref 83–108)

## 2024-01-12 LAB — GLUCOSE, CAPILLARY
Glucose-Capillary: 120 mg/dL — ABNORMAL HIGH (ref 70–99)
Glucose-Capillary: 110 mg/dL — ABNORMAL HIGH (ref 70–99)
Glucose-Capillary: 130 mg/dL — ABNORMAL HIGH (ref 70–99)
Glucose-Capillary: 137 mg/dL — ABNORMAL HIGH (ref 70–99)
Glucose-Capillary: 144 mg/dL — ABNORMAL HIGH (ref 70–99)
Glucose-Capillary: 119 mg/dL — ABNORMAL HIGH (ref 70–99)
Glucose-Capillary: 121 mg/dL — ABNORMAL HIGH (ref 70–99)
Glucose-Capillary: 101 mg/dL — ABNORMAL HIGH (ref 70–99)
Glucose-Capillary: 154 mg/dL — ABNORMAL HIGH (ref 70–99)
Glucose-Capillary: 161 mg/dL — ABNORMAL HIGH (ref 70–99)
Glucose-Capillary: 130 mg/dL — ABNORMAL HIGH (ref 70–99)
Glucose-Capillary: 106 mg/dL — ABNORMAL HIGH (ref 70–99)
Glucose-Capillary: 163 mg/dL — ABNORMAL HIGH (ref 70–99)

## 2024-01-12 LAB — CBC
HCT: 32.6 % — ABNORMAL LOW (ref 39.0–52.0)
Hemoglobin: 10.4 g/dL — ABNORMAL LOW (ref 13.0–17.0)
MCH: 25.7 pg — ABNORMAL LOW (ref 26.0–34.0)
MCHC: 31.9 g/dL (ref 30.0–36.0)
MCV: 80.5 fL (ref 80.0–100.0)
Platelets: 110 K/uL — ABNORMAL LOW (ref 150–400)
RBC: 4.05 MIL/uL — ABNORMAL LOW (ref 4.22–5.81)
RDW: 20.7 % — ABNORMAL HIGH (ref 11.5–15.5)
WBC: 15.3 K/uL — ABNORMAL HIGH (ref 4.0–10.5)
nRBC: 0 % (ref 0.0–0.2)

## 2024-01-12 LAB — COMPREHENSIVE METABOLIC PANEL WITH GFR
ALT: 25 U/L (ref 0–44)
AST: 68 U/L — ABNORMAL HIGH (ref 15–41)
Albumin: 3.3 g/dL — ABNORMAL LOW (ref 3.5–5.0)
Alkaline Phosphatase: 44 U/L (ref 38–126)
Anion gap: 10 (ref 5–15)
BUN: 15 mg/dL (ref 6–20)
CO2: 23 mmol/L (ref 22–32)
Calcium: 8.3 mg/dL — ABNORMAL LOW (ref 8.9–10.3)
Chloride: 98 mmol/L (ref 98–111)
Creatinine, Ser: 1.35 mg/dL — ABNORMAL HIGH (ref 0.61–1.24)
GFR, Estimated: >60 mL/min (ref >60)
Glucose, Bld: 121 mg/dL — ABNORMAL HIGH (ref 70–99)
Potassium: 5 mmol/L (ref 3.5–5.1)
Sodium: 131 mmol/L — ABNORMAL LOW (ref 135–145)
Total Bilirubin: 1.5 mg/dL — ABNORMAL HIGH (ref 0.0–1.2)
Total Protein: 5.5 g/dL — ABNORMAL LOW (ref 6.5–8.1)

## 2024-01-12 LAB — PREPARE FRESH FROZEN PLASMA
Unit division: 0
Unit division: 0

## 2024-01-12 LAB — SURGICAL PATHOLOGY

## 2024-01-12 LAB — PHOSPHORUS: Phosphorus: 4 mg/dL (ref 2.5–4.6)

## 2024-01-12 LAB — ECHO INTRAOPERATIVE TEE
Height: 75 in
Weight: 5104 [oz_av]

## 2024-01-12 LAB — TRIGLYCERIDES: Triglycerides: 26 mg/dL (ref <150)

## 2024-01-12 LAB — CALCIUM, IONIZED: Calcium, Ionized, Serum: 4.4 mg/dL — ABNORMAL LOW (ref 4.5–5.6)

## 2024-01-12 LAB — PROTIME-INR
INR: 1.2 (ref 0.8–1.2)
Prothrombin Time: 15.9 s — ABNORMAL HIGH (ref 11.4–15.2)

## 2024-01-12 LAB — BRAIN NATRIURETIC PEPTIDE: B Natriuretic Peptide: 623.8 pg/mL — ABNORMAL HIGH (ref 0.0–100.0)

## 2024-01-12 LAB — LACTATE DEHYDROGENASE: LDH: 274 U/L — ABNORMAL HIGH (ref 98–192)

## 2024-01-12 LAB — MAGNESIUM
Magnesium: 2.4 mg/dL (ref 1.7–2.4)
Magnesium: 2.2 mg/dL (ref 1.7–2.4)

## 2024-01-12 LAB — COOXEMETRY PANEL
Carboxyhemoglobin: 2.2 % — ABNORMAL HIGH (ref 0.5–1.5)
Methemoglobin: <0.7 % (ref 0.0–1.5)
O2 Saturation: 65.2 %
Total hemoglobin: 11.2 g/dL — ABNORMAL LOW (ref 12.0–16.0)

## 2024-01-12 MED ORDER — GABAPENTIN 100 MG PO CAPS
200.0000 mg | ORAL_CAPSULE | Freq: Every day | ORAL | Status: DC
  Administered 2024-01-12: 200 mg via ORAL
  Filled 2024-01-12: qty 2

## 2024-01-12 MED ORDER — INSULIN ASPART 100 UNIT/ML IJ SOLN
0.0000 [IU] | INTRAMUSCULAR | Status: DC
  Administered 2024-01-12: 2 [IU] via SUBCUTANEOUS
  Administered 2024-01-12: 4 [IU] via SUBCUTANEOUS
  Administered 2024-01-13 (×3): 2 [IU] via SUBCUTANEOUS

## 2024-01-12 MED ORDER — FUROSEMIDE 10 MG/ML IJ SOLN
40.0000 mg | Freq: Once | INTRAMUSCULAR | Status: AC
  Administered 2024-01-12: 40 mg via INTRAVENOUS
  Filled 2024-01-12: qty 4

## 2024-01-12 MED ORDER — EZETIMIBE 10 MG PO TABS
10.0000 mg | ORAL_TABLET | Freq: Every day | ORAL | Status: DC
  Administered 2024-01-12 – 2024-01-24 (×13): 10 mg via ORAL
  Filled 2024-01-12 (×13): qty 1

## 2024-01-12 MED ORDER — WARFARIN SODIUM 2.5 MG PO TABS
2.5000 mg | ORAL_TABLET | Freq: Once | ORAL | Status: AC
  Administered 2024-01-12: 2.5 mg via ORAL
  Filled 2024-01-12: qty 1

## 2024-01-12 MED ORDER — WARFARIN - PHYSICIAN DOSING INPATIENT: Freq: Every day | Status: DC

## 2024-01-12 MED ORDER — ORAL CARE MOUTH RINSE
15.0000 mL | OROMUCOSAL | Status: DC | PRN
Start: 2024-01-12 — End: 2024-01-24

## 2024-01-12 MED ORDER — INSULIN GLARGINE-YFGN 100 UNIT/ML ~~LOC~~ SOLN
18.0000 [IU] | Freq: Every day | SUBCUTANEOUS | Status: DC
  Administered 2024-01-12 – 2024-01-24 (×13): 18 [IU] via SUBCUTANEOUS
  Filled 2024-01-12 (×13): qty 0.18

## 2024-01-12 NOTE — Progress Notes (Signed)
 LVAD Coordinator Rounding Note:  Admitted 12/25/23 due to CHF and planned teeth extractions prior to VAD implant.   HM3 LVAD implanted on 01/11/24 by BS under DT criteria.  Pt lying in the bed sedated/intubated. Pt wakes up and follows commands.  Nurse had issues with pt cable last night. Nurse was instructed to change cable with the cable from [facility] cart. This did not happen. Pt cable was bent back behind the PM. VAD coordinator switched out pt cable this morning.  Vital signs: HR: 99.7 Doppler Pressure: 82 Automatic BP: 94/81 (88) O2 Sat: 100% on 40% FiO2 Wt: 319>349.2 lbs     LVAD interrogation reveals:   Speed: 5500 Flow: 5.2 Power:  4.1 PI:3.5  Alarms: none Events:  50+ Hematocrit: 33 Fixed speed: 5500 Low speed limit: 5200  Drive Line: Existing VAD dressing removed and site care performed using sterile technique. Drive line exit site cleaned with Chlora prep applicators x 2, allowed to dry, and gauze dressing with Silverlon patch applied. Exit site with red rubber and 1 suture intact, unincorporated, the velour is fully implanted at exit site. Scant amount of bloody drainage. No redness, tenderness, foul odor or rash noted. Drive line anchor re-applied. Continue daily dressing changes. Next dressing change tomorrow 01/13/24 by VAD coordinator or nurse champion only.        Labs:  LDH trend: 274  INR trend: 1.2  Anticoagulation Plan: -INR Goal: 2-2.5 -ASA Dose: none  Blood Products:  - <intra-op/post-op> 01/11/24 - 4 FFP - 625 cc of cell saver  Device: -n/a -Therapies:  Arrythmias: frequent  PVCs in OR  Respiratory:on Vent 40 % NO @ 20  Renal:  -CRT: 1.35  Gtts: Amio 60 mg/hr Precedex 1.2 mcg/kg/hr Milrinone  0.375 mcg/kg/min Epi 2 mcg/min  Adverse Events on VAD: -  Patient Education: No family at bedside. Pt intubated/sedated.   Plan/Recommendations:   1. Please page VAD coordinator for any equipment issues or VAD alarms as well as for any  procedures on VAD pts. 2. Daily driveline dressing changes by VAD coordinator or nurse champion  Lauraine Ip RN, BSN VAD Coordinator 24/7 Pager [phone]

## 2024-01-12 NOTE — Plan of Care (Signed)
  Problem: Coping: Goal: Level of anxiety will decrease Outcome: Progressing   Problem: Elimination: Goal: Will not experience complications related to bowel motility Outcome: Progressing   Problem: Pain Managment: Goal: General experience of comfort will improve and/or be controlled Outcome: Progressing   Problem: Safety: Goal: Ability to remain free from injury will improve Outcome: Progressing   Problem: Skin Integrity: Goal: Risk for impaired skin integrity will decrease Outcome: Progressing   Problem: Cardiac: Goal: Ability to maintain an adequate cardiac output will improve Outcome: Progressing Note: Hemodynamics monitored, interventions when needed.   Problem: Fluid Volume: Goal: Risk for excess fluid volume will decrease Outcome: Progressing   Problem: Clinical Measurements: Goal: Ability to maintain clinical measurements within normal limits will improve Outcome: Progressing Note: Pt able to wake, nod and gesture appropriately, CT output marginal Goal: Will remain free from infection Outcome: Progressing Note: Abx given as ordered   Problem: Respiratory: Goal: Will regain and/or maintain adequate ventilation Outcome: Progressing Note: ABG continues to improve, plan to begin weaning from vent and NiOx this AM

## 2024-01-12 NOTE — Progress Notes (Addendum)
 NAME:  Philip Monroe, MRN:  [id number], DOB:  09/08/1975, LOS: 7 ADMISSION DATE:  01/05/2024, CONSULTATION DATE: 10/30  REFERRING MD:  MD Su CHIEF COMPLAINT:  LVAD  History of Present Illness:  Patient is a 48 year old male with significant past medical history of chronic HFrEF < 20%, CAD multivessel (recent [facility] 5/24), followed in past by [facility] Cards for iCM/noncompaction CM, pulmonary HTN, HTN, CKD stage III, diabetes type 2, HLD, hx of ETOH use, multiple recent admission of CHF exacerbations- most recent in 9/25 who was admitted on 01/05/24 for HF optimization, and also teeth extraction x 4 (occurred on 10/27) for LVAD surgery on 01/11/24 by MD Su with TCTS. PCCM consulted to assist with post-op vent management as well as critical care needs during ICU stay.   Estimated blood loss 800 mL  4 of FFP given Protamine given for heparin  reversal DDAVP  Pertinent  Medical History   Past Medical History:  Diagnosis Date   Aortic root dilatation    a. 07/2022 Echo: Ao root 4.4cm, Asc Ao 4.0cm.   Asthma    as a child, no problems as an adult, rarely uses inhaler   CAD (coronary artery disease)    a. 08/2017 Cath: LM nl, LAD 7m/d, D1 70ost, LCX 178m, OM1 90 inf branch, 60 sup branch, OM2 small, RCA 30 diffuse, RPDA 100 w/ L->R collats; b. 07/2022 Echo: LM nl, LAD 76m, D1 80, LCX 153m, OM1 80, RCA 68m, RPDA 100-->No targets for PCI/CABG-->Med rx.   CHF (congestive heart failure) (HCC)    takes lasix    Chronic HFrEF (heart failure with reduced ejection fraction) (HCC)    a. 03/2015 Echo: EF 35-40%; b. 08/2017 Echo: EF 15%; c. 08/2017 cMRI: EF 35%; d. 06/2018 Echo: EF 10-15%; e. 07/2022 Echo: EF 15-20%, grIII DD, mild MR, sev dil LA, mod-sev PH, Ao root 4.4cm, Asc Ao 4.0cm; f. 07/2022 RHC: RA 4, RV 38/3, PA 43/18 (26), PCWP 11. CO/CI 5.3/1.8.   CKD (chronic kidney disease), stage II    Diabetes mellitus without complication (HCC)    type 2   Dyspnea    with exertion   Gout    Hyperlipidemia LDL goal  <70    Hypertension    Ischemic cardiomyopathy    a. 03/2015 Echo: EF 35-40%; b. 08/2017 Echo: EF 15%; c. 08/2017 cMRI: EF 35%; d. 06/2018 Echo: EF 10-15%; e. 07/2022 Echo: EF 15-20%, grIII DD.   IVCD (intraventricular conduction defect)    Left ventricular noncompaction [facility])    Morbid obesity (HCC)    NSVT (nonsustained ventricular tachycardia) (HCC)    a. 09/2022 Event monitor: predominantly sinus rhythm, avg HR 81 (46-167). 17 runs NSVT (fastest/longest 13 beats x 167). Rare PACs, 17.5% PVC burden.   PVC's (premature ventricular contractions)    a. 09/2022 Event Monitor: 17.5% PVC burden.   Statin intolerance    Tobacco abuse      Significant Hospital Events: Including procedures, antibiotic start and stop dates in addition to other pertinent events   10/24 Admit for HF optimization for LVAD surgery on 10/30 10/27 Teeth Extraction x 4 by oral surgery  10/30 HM3 LVAD placement, PCCM consult for post-op management   Interim History / Subjective:   Coox 65 LDH 274 BNP 623 on Epi 2 milrinone  0.375  Amio 60   Cr 1.35 Na 131 wbc 14 hgb 11 plt 123    Objective    Blood pressure 94/80, pulse 83, temperature 100 F (37.8 C), resp. rate ROLLEN)  26, height 6' 3 (1.905 m), weight (!) 158.4 kg, SpO2 100%. PAP: (26-51)/(14-30) 33/18 CVP:  [0 mmHg-20 mmHg] 7 mmHg CO:  [5 L/min-7.7 L/min] 5.7 L/min CI:  [1.9 L/min/m2-2.9 L/min/m2] 2.1 L/min/m2  Vent Mode: SIMV;PSV;PRVC FiO2 (%):  [40 %-50 %] 40 % Set Rate:  [16 bmp] 16 bmp Vt Set:  [670 mL-680 mL] 680 mL PEEP:  [5 cmH20-10 cmH20] 5 cmH20 Pressure Support:  [10 cmH20] 10 cmH20 Plateau Pressure:  [14 cmH20-22 cmH20] 14 cmH20   Intake/Output Summary (Last 24 hours) at 01/12/2024 1036 Last data filed at 01/12/2024 1000 Gross per 24 hour  Intake 7921.96 ml  Output 3663 ml  Net 4258.96 ml   Filed Weights   01/11/24 0400 01/11/24 0630 01/12/24 0500  Weight: (!) 145.1 kg (!) 144.7 kg (!) 158.4 kg    Examination: General: chronically and  critically ill middle aged M  HENT: ETT secure  Lungs: mechanically ventilated  Cardiovascular: a paced. LVAD hum  Abdomen: soft round  Extremities: no cyanosis or clubbing  Neuro: sedated, awakens easily GU: foley  Resolved problem list   Assessment and Plan   Endotracheally intubated  P -hope is to extubate 10/31, need to clarify iNO plan   Chronic systolic HF s/p LVAD HM III  mvCAD Mild pHTN PVCs with prior poor amio tolerance  Hx HTN  Hx HLD  P -post op, surgical L/T/D/W  per CVTS -complete ppx abx  -adv HF following, recs appreciated -follow coox ldh -milrinone , epi per HF -iNO per HF.  -amio  -restart mexitil   -insulin  gtt-- transition off as per protocol -coumadin to start 10/31 night  -statin, no ASA w AC above  -lasix     CKD II -follow renal indices UOP   Expected ABLA, thrombocytopenia  P -follow CBC   Morbid obesity -no acute intervention   S/p Teeth Extraction 10/27  -Per oral surgery  Gout  -resume home meds when appropriate    Labs   CBC: Recent Labs  Lab 01/05/24 1907 01/06/24 0339 01/09/24 0222 01/10/24 0229 01/11/24 0749 01/11/24 1113 01/11/24 1202 01/11/24 1456 01/11/24 1458 01/11/24 1813 01/11/24 2025 01/11/24 2049 01/12/24 0500 01/12/24 0558  WBC 7.6   < > 11.8* 14.2*  --   --   --  16.5*  --   --   --  13.1* 14.4*  --   NEUTROABS 3.1  --   --   --   --   --   --   --   --   --   --   --  10.9*  --   HGB 13.7   < > 14.8 14.3   < > 10.1*   < > 10.7*   < > 11.6* 11.2* 10.6* 10.9* 10.9*  HCT 43.6   < > 46.7 44.0   < > 31.5*   < > 33.2*   < > 34.0* 33.0* 32.9* 33.0* 32.0*  MCV 80.3   < > 79.6* 79.6*  --   --   --  80.2  --   --   --  81.0 79.3*  --   PLT 165   < > 202 182  --  154  --  138*  130*  --   --   --  128* 123*  --    < > = values in this interval not displayed.    Basic Metabolic Panel: Recent Labs  Lab 01/09/24 0222 01/09/24 1039 01/10/24 0229 01/11/24 0749 01/11/24 1207 01/11/24 1245 01/11/24  1329  01/11/24 1349 01/11/24 1456 01/11/24 1458 01/11/24 1813 01/11/24 2025 01/11/24 2049 01/12/24 0500 01/12/24 0558  NA 133* 133* 134*   < > 134*   < > 134*   < > 132*   < > 132* 133* 131* 131* 133*  K 5.7* 5.2* 4.1   < > 4.1   < > 4.6   < > 4.8   < > 5.1 4.8 4.6 5.0 5.0  CL 97* 98 93*   < > 94*  --  96*  --  100  --   --   --  98 98  --   CO2 27 27 29   --   --   --   --   --  23  --   --   --  22 23  --   GLUCOSE 123* 110* 139*   < > 193*  --  173*  --  168*  --   --   --  177* 121*  --   BUN 15 17 24*   < > 22*  --  22*  --  19  --   --   --  17 15  --   CREATININE 1.51* 1.38* 1.59*   < > 1.30*  --  1.30*  --  1.44*  --   --   --  1.42* 1.35*  --   CALCIUM 9.7 9.7 9.4  --   --   --   --   --  7.6*  --   --   --  8.0* 8.3*  --   MG 2.3  --  1.8  --   --   --   --   --  1.8  --   --   --  2.5* 2.4  --   PHOS  --   --   --   --   --   --   --   --   --   --   --   --   --  4.0  --    < > = values in this interval not displayed.   GFR: Estimated Creatinine Clearance: 109.2 mL/min (A) (by C-G formula based on SCr of 1.35 mg/dL (H)). Recent Labs  Lab 01/10/24 0229 01/11/24 1456 01/11/24 2049 01/12/24 0500  WBC 14.2* 16.5* 13.1* 14.4*    Liver Function Tests: Recent Labs  Lab 01/05/24 1907 01/12/24 0500  AST 25 68*  ALT 30 25  ALKPHOS 62 44  BILITOT 0.9 1.5*  PROT 6.1* 5.5*  ALBUMIN 3.3* 3.3*   No results for input(s): LIPASE, AMYLASE in the last 168 hours. No results for input(s): AMMONIA in the last 168 hours.  ABG    Component Value Date/Time   PHART 7.387 01/12/2024 0558   PCO2ART 39.8 01/12/2024 0558   PO2ART 97 01/12/2024 0558   HCO3 23.8 01/12/2024 0558   TCO2 25 01/12/2024 0558   ACIDBASEDEF 1.0 01/12/2024 0558   O2SAT 97 01/12/2024 0558     Coagulation Profile: Recent Labs  Lab 01/11/24 1242 01/11/24 1456 01/12/24 0500  INR 1.3* 1.2  1.2 1.2    Cardiac Enzymes: No results for input(s): CKTOTAL, CKMB, CKMBINDEX, TROPONINI in the last  168 hours.  HbA1C: Hgb A1c MFr Bld  Date/Time Value Ref Range Status  11/29/2023 04:45 AM 6.6 (H) 4.8 - 5.6 % Final    Comment:    (NOTE) Diagnosis of Diabetes The following HbA1c ranges recommended by the American Diabetes  Association (ADA) may be used as an aid in the diagnosis of diabetes mellitus.  Hemoglobin             Suggested A1C NGSP%              Diagnosis  <5.7                   Non Diabetic  5.7-6.4                Pre-Diabetic  >6.4                   Diabetic  <7.0                   Glycemic control for                       adults with diabetes.      CBG: Recent Labs  Lab 01/11/24 2359 01/12/24 0114 01/12/24 0312 01/12/24 0522 01/12/24 0706  GLUCAP 144* 137* 130* 120* 110*   CRITICAL CARE Performed by: Ronnald FORBES Gave   Total critical care time: 40 minutes  Critical care time was exclusive of separately billable procedures and treating other patients. Critical care was necessary to treat or prevent imminent or life-threatening deterioration.  Critical care was time spent personally by me on the following activities: development of treatment plan with patient and/or surrogate as well as nursing, discussions with consultants, evaluation of patient's response to treatment, examination of patient, obtaining history from patient or surrogate, ordering and performing treatments and interventions, ordering and review of laboratory studies, ordering and review of radiographic studies, pulse oximetry and re-evaluation of patient's condition.   Ronnald Gave MSN, AGACNP-BC [facility] Amion for pager  01/12/2024, 10:36 AM

## 2024-01-12 NOTE — Progress Notes (Addendum)
 Advanced Heart Failure Rounding Note  Chief Complaint: s/p HM3 Implant Subjective:    10/30 S/P HMIII   Milrinone  0.375  + Epi 2 + Amio 60/hr   PAP: (26-51)/(14-30) 32/19 CVP:  [0 mmHg-20 mmHg] 12 mmHg CO:  [5 L/min-7.7 L/min] 5.7 L/min CI:  [1.9 L/min/m2-2.9 L/min/m2] 2.1 L/min/m2  LVAD Interrogation HM III: Speed: 5500 Flow: 5.1 PI: 3.3 Power: 4   Awake on the vent. Follows commands.   Objective:    Vital Signs:   Temp:  [97.7 F (36.5 C)-99.7 F (37.6 C)] 99.7 F (37.6 C) (10/31 0800) Pulse Rate:  [43-204] 85 (10/31 0800) Resp:  [15-36] 27 (10/31 0800) BP: (68-111)/(58-85) 94/81 (10/31 0800) SpO2:  [93 %-100 %] 100 % (10/31 0800) Arterial Line BP: (67-304)/(54-92) 81/74 (10/31 0800) FiO2 (%):  [40 %-50 %] 40 % (10/31 0800) Weight:  [158.4 kg] 158.4 kg (10/31 0500) Last BM Date : 01/10/24  Weight change: Filed Weights   01/11/24 0400 01/11/24 0630 01/12/24 0500  Weight: (!) 145.1 kg (!) 144.7 kg (!) 158.4 kg   Intake/Output:  Intake/Output Summary (Last 24 hours) at 01/12/2024 0818 Last data filed at 01/12/2024 0800 Gross per 24 hour  Intake 9525.37 ml  Output 4153 ml  Net 5372.37 ml    Physical Exam  Physical Exam: GENERAL: Intubated. NECK: JVP difficult to assess CARDIAC:  Mechanical heart sounds with LVAD hum present. Prevena VAC to sternnal incision LUNGS:  Clear to auscultation bilaterally.  ABDOMEN:  Soft, round, nontender, positive bowel sounds x4.     LVAD exit site:  Dressing dry and intact.   EXTREMITIES:  Warm and dry NEUROLOGIC:     Awake on the vent . MAE x4    Telemetry: SR with PVCs.  Labs: Basic Metabolic Panel: Recent Labs  Lab 01/09/24 0222 01/09/24 1039 01/10/24 0229 01/11/24 0749 01/11/24 1207 01/11/24 1245 01/11/24 1329 01/11/24 1349 01/11/24 1456 01/11/24 1458 01/11/24 1813 01/11/24 2025 01/11/24 2049 01/12/24 0500 01/12/24 0558  NA 133* 133* 134*   < > 134*   < > 134*   < > 132*   < > 132* 133* 131* 131* 133*   K 5.7* 5.2* 4.1   < > 4.1   < > 4.6   < > 4.8   < > 5.1 4.8 4.6 5.0 5.0  CL 97* 98 93*   < > 94*  --  96*  --  100  --   --   --  98 98  --   CO2 27 27 29   --   --   --   --   --  23  --   --   --  22 23  --   GLUCOSE 123* 110* 139*   < > 193*  --  173*  --  168*  --   --   --  177* 121*  --   BUN 15 17 24*   < > 22*  --  22*  --  19  --   --   --  17 15  --   CREATININE 1.51* 1.38* 1.59*   < > 1.30*  --  1.30*  --  1.44*  --   --   --  1.42* 1.35*  --   CALCIUM 9.7 9.7 9.4  --   --   --   --   --  7.6*  --   --   --  8.0* 8.3*  --   MG 2.3  --  1.8  --   --   --   --   --  1.8  --   --   --  2.5* 2.4  --   PHOS  --   --   --   --   --   --   --   --   --   --   --   --   --  4.0  --    < > = values in this interval not displayed.   Liver Function Tests: Recent Labs  Lab 01/05/24 1907 01/12/24 0500  AST 25 68*  ALT 30 25  ALKPHOS 62 44  BILITOT 0.9 1.5*  PROT 6.1* 5.5*  ALBUMIN 3.3* 3.3*   No results for input(s): LIPASE, AMYLASE in the last 168 hours. No results for input(s): AMMONIA in the last 168 hours.  CBC: Recent Labs  Lab 01/05/24 1907 01/06/24 0339 01/09/24 0222 01/10/24 0229 01/11/24 0749 01/11/24 1113 01/11/24 1202 01/11/24 1456 01/11/24 1458 01/11/24 1813 01/11/24 2025 01/11/24 2049 01/12/24 0500 01/12/24 0558  WBC 7.6   < > 11.8* 14.2*  --   --   --  16.5*  --   --   --  13.1* 14.4*  --   NEUTROABS 3.1  --   --   --   --   --   --   --   --   --   --   --  10.9*  --   HGB 13.7   < > 14.8 14.3   < > 10.1*   < > 10.7*   < > 11.6* 11.2* 10.6* 10.9* 10.9*  HCT 43.6   < > 46.7 44.0   < > 31.5*   < > 33.2*   < > 34.0* 33.0* 32.9* 33.0* 32.0*  MCV 80.3   < > 79.6* 79.6*  --   --   --  80.2  --   --   --  81.0 79.3*  --   PLT 165   < > 202 182  --  154  --  138*  130*  --   --   --  128* 123*  --    < > = values in this interval not displayed.   BNP (last 3 results) Recent Labs    12/25/23 1139 01/05/24 1907 01/12/24 0500  BNP 315.6* 674.8*  623.8*   Medications:    Scheduled Medications:  acetaminophen   1,000 mg Oral Q6H   Or   acetaminophen  (TYLENOL ) oral liquid 160 mg/5 mL  1,000 mg Per Tube Q6H   bisacodyl  10 mg Oral Daily   Or   bisacodyl  10 mg Rectal Daily   Chlorhexidine  Gluconate Cloth  6 each Topical Daily   docusate sodium  200 mg Oral Daily   gabapentin  200 mg Per Tube QHS   mexiletine  150 mg Oral Q8H   [START ON 01/13/2024] pantoprazole  40 mg Oral Daily   sodium chloride  flush  3 mL Intravenous Q12H   warfarin  2.5 mg Oral ONCE-1600   Warfarin - Physician Dosing Inpatient   Does not apply q1600    Infusions:  sodium chloride      sodium chloride  Stopped (01/12/24 0545)   sodium chloride      albumin human Stopped (01/11/24 2118)   amiodarone  60 mg/hr (01/12/24 0800)    ceFAZolin (ANCEF) IV Stopped (01/12/24 0556)   dexmedetomidine (PRECEDEX) IV infusion 1.2 mcg/kg/hr (01/12/24 0800)   epinephrine 2 mcg/min (  01/12/24 0800)   insulin  2 Units/hr (01/12/24 0800)   lactated ringers     lactated ringers 20 mL/hr at 01/12/24 0800   milrinone  0.375 mcg/kg/min (01/12/24 0800)   norepinephrine (LEVOPHED) Adult infusion Stopped (01/12/24 0556)   rifampin (RIFADIN) 600 mg in sodium chloride  0.9 % 100 mL IVPB 600 mg (01/12/24 0813)   vancomycin Stopped (01/11/24 2331)   vasopressin Stopped (01/12/24 0013)    PRN Medications: sodium chloride , albumin human, dextrose , fentaNYL  (SUBLIMAZE ) injection, midazolam  PF, morphine  injection, ondansetron  (ZOFRAN ) IV, oxyCODONE , sodium chloride  flush, traMADol   Plan/Discussion:    1. Chronic systolic heart failure: - combined ischemic and nonischemic: -Mentions of compaction CM from [facility] cards notes. PVCs may also contribute, 25.3% PVCs with NSVT on Zio monitor in 5/25 - Echo 9/25 with EF < 20%, sev LV dilation, mod RV enlargement with low normal systolic function, mod MR - CMRI 9/25 LV EF 14%, RV EF 29%, suspect mixed ischemic/nonischemic cardiomyopathy with  scarring process due to MI in the mid inferior/inferolateral wall and non-coronary type mid-wall LGE in the basal septum. - RHC 10/25 with low filling pressures and reduced CI (1.9 TD, 2.2 Fick). - NYHA class IV symptoms on admission.  - S/P HM3 LVAD Implant - intra-op TEE with normal RV function (per anesthesia report). Received 4FFP, 650 CS intra-op - Remains on  milrinone  0.375 + Epi 2 mcg. CO 5.4 CI 2  -CVP 11-12 . Start to gently diurese. Given 40 mg IV lasix  x1. Watch potassium.  - Starting coumadin tonight per Dr Glynnis Gavel. INR 1.2.  -LDH , trend daily.  _Vent mgmt per CCM; plan to extubation later today. Wean iNO   on post-op abx    2 CAD:   - LHC 5/24 at [facility]: severe multivessel CAD including 80% mid RCA, 100% RPDA, 100% mid LCx, 80% OM1, 80% mid-distal LAD.  - LHC 9/25: occluded PDA and mid AV LCx, 90% ramus, severe diffuse disease in right PLV, nonobstructive LAD disease. With markedly low EF and without severe LAD disease, CABG nor a durable option.  - No ASA given anticoagulation use and stable CAD.  - Restart zetia    3. PVCs:  - Zio 5/25 with 25.3% (prev did not tolerate amiodarone  well, on mexiletine) - increased burden post-op - continue amio 60/hr for now - restart mexilitine 150 mg every 8 hours.     Length of Stay: 7  CRITICAL CARE Performed by: Greig Mosses NP-C   Total critical care time: 15 minutes  -Critical care time was exclusive of separately billable procedures and treating other patients. -Critical care was necessary to treat or prevent imminent or life-threatening deterioration. -Critical care was time spent personally by me on the following activities: development of treatment plan with patient and/or surrogate as well as nursing, discussions with consultants, evaluation of patient's response to treatment, examination of patient, obtaining history from patient or surrogate, ordering and performing treatments and interventions, ordering and review of laboratory  studies, ordering and review of radiographic studies, pulse oximetry and re-evaluation of patient's condition.  Greig Mosses, NP 01/12/2024, 8:18 AM  Advanced Heart Failure Team Pager [phone] (M-F; 7a - 4p)  Please contact [facility] for night-coverage after hours (4p -7a ) and weekends on [email or web address]   Agree with above.   He is awake on vent. Currently weaning.   On milrinone , Epi 2  and iNO  Hemodynamics ok. Having frequent PVCs  General:  Awake on vent  HEENT: normal  + ETT Neck: supple.RIJ swan  Cor: sternal wound ok LVAD hum.  + CTs Lungs: Clear. Abdomen: obese soft, nontender, non-distended. No hepatosplenomegaly. No bruits or masses. Good bowel sounds. Driveline site clean. Anchor in place.  Extremities: no cyanosis, clubbing, rash. Warm 1+ edema  Neuro: awake on vent  follows commands  Continue to work toward extubation. Can drop iNO to 15. Start gentle diuresis. Wean off NE  VAD interrogated personally. Parameters stable.  Start warfarin per TCTS.   CRITICAL CARE Performed by: Cherrie Sieving  Total critical care time: 39 minutes  Critical care time was exclusive of separately billable procedures and treating other patients.  Critical care was necessary to treat or prevent imminent or life-threatening deterioration.  Critical care was time spent personally by me (independent of midlevel providers or residents) on the following activities: development of treatment plan with patient and/or surrogate as well as nursing, discussions with consultants, evaluation of patient's response to treatment, examination of patient, obtaining history from patient or surrogate, ordering and performing treatments and interventions, ordering and review of laboratory studies, ordering and review of radiographic studies, pulse oximetry and re-evaluation of patient's condition.  Sieving Cherrie, MD  2:50 PM

## 2024-01-12 NOTE — Procedures (Signed)
 Extubation Procedure Note  Patient Details:   Name: Philip Monroe DOB: Mar 13, 1976 MRN: [id number]   Airway Documentation:    Vent end date: 01/12/24 Vent end time: 1220   Evaluation  O2 sats: stable throughout Complications: No apparent complications Patient did tolerate procedure well. Bilateral Breath Sounds: Clear, Diminished   Yes  Positive cuff leak prior to extubation. Pt extubated to 5L White w/ nitric bled in at 15ppm.  Dena VEAR Samuel 01/12/2024, 12:24 PM

## 2024-01-12 NOTE — Progress Notes (Signed)
 TCTS  Evening Rounds:  Hemodynamically stable on epi 2, milrinone  0.375. LVAD flow 5L/min  Extubated today on nasal NO  Diuresed   BMET    Component Value Date/Time   NA 131 (L) 01/12/2024 1613   K 4.4 01/12/2024 1613   CL 98 01/12/2024 1613   CO2 24 01/12/2024 1613   GLUCOSE 137 (H) 01/12/2024 1613   BUN 13 01/12/2024 1613   CREATININE 1.36 (H) 01/12/2024 1613   CALCIUM 8.4 (L) 01/12/2024 1613   GFRNONAA >60 01/12/2024 1613   CBC    Component Value Date/Time   WBC 15.3 (H) 01/12/2024 1613   RBC 4.05 (L) 01/12/2024 1613   HGB 10.4 (L) 01/12/2024 1613   HCT 32.6 (L) 01/12/2024 1613   PLT 110 (L) 01/12/2024 1613   MCV 80.5 01/12/2024 1613   MCH 25.7 (L) 01/12/2024 1613   MCHC 31.9 01/12/2024 1613   RDW 20.7 (H) 01/12/2024 1613   LYMPHSABS 1.2 01/12/2024 0500   MONOABS 2.2 (H) 01/12/2024 0500   EOSABS 0.0 01/12/2024 0500   BASOSABS 0.0 01/12/2024 0500

## 2024-01-13 DIAGNOSIS — I5022 Chronic systolic (congestive) heart failure: Secondary | ICD-10-CM | POA: Diagnosis not present

## 2024-01-13 DIAGNOSIS — I5043 Acute on chronic combined systolic (congestive) and diastolic (congestive) heart failure: Secondary | ICD-10-CM | POA: Diagnosis not present

## 2024-01-13 DIAGNOSIS — Z515 Encounter for palliative care: Secondary | ICD-10-CM | POA: Diagnosis not present

## 2024-01-13 DIAGNOSIS — J9601 Acute respiratory failure with hypoxia: Secondary | ICD-10-CM

## 2024-01-13 DIAGNOSIS — I255 Ischemic cardiomyopathy: Secondary | ICD-10-CM | POA: Diagnosis not present

## 2024-01-13 DIAGNOSIS — I2510 Atherosclerotic heart disease of native coronary artery without angina pectoris: Secondary | ICD-10-CM | POA: Diagnosis not present

## 2024-01-13 DIAGNOSIS — I340 Nonrheumatic mitral (valve) insufficiency: Secondary | ICD-10-CM

## 2024-01-13 DIAGNOSIS — I428 Other cardiomyopathies: Secondary | ICD-10-CM | POA: Diagnosis not present

## 2024-01-13 DIAGNOSIS — I5023 Acute on chronic systolic (congestive) heart failure: Secondary | ICD-10-CM | POA: Diagnosis not present

## 2024-01-13 DIAGNOSIS — Z95811 Presence of heart assist device: Secondary | ICD-10-CM | POA: Diagnosis not present

## 2024-01-13 LAB — CBC WITH DIFFERENTIAL/PLATELET
Abs Immature Granulocytes: 0.11 K/uL — ABNORMAL HIGH (ref 0.00–0.07)
Basophils Absolute: 0.1 K/uL (ref 0.0–0.1)
Basophils Relative: 0 %
Eosinophils Absolute: 0.1 K/uL (ref 0.0–0.5)
Eosinophils Relative: 0 %
HCT: 33.8 % — ABNORMAL LOW (ref 39.0–52.0)
Hemoglobin: 10.7 g/dL — ABNORMAL LOW (ref 13.0–17.0)
Immature Granulocytes: 1 %
Lymphocytes Relative: 9 %
Lymphs Abs: 1.5 K/uL (ref 0.7–4.0)
MCH: 25.7 pg — ABNORMAL LOW (ref 26.0–34.0)
MCHC: 31.7 g/dL (ref 30.0–36.0)
MCV: 81.3 fL (ref 80.0–100.0)
Monocytes Absolute: 3 K/uL — ABNORMAL HIGH (ref 0.1–1.0)
Monocytes Relative: 18 %
Neutro Abs: 12.2 K/uL — ABNORMAL HIGH (ref 1.7–7.7)
Neutrophils Relative %: 72 %
Platelets: 128 K/uL — ABNORMAL LOW (ref 150–400)
RBC: 4.16 MIL/uL — ABNORMAL LOW (ref 4.22–5.81)
RDW: 21.1 % — ABNORMAL HIGH (ref 11.5–15.5)
WBC: 16.9 K/uL — ABNORMAL HIGH (ref 4.0–10.5)
nRBC: 0 % (ref 0.0–0.2)

## 2024-01-13 LAB — POCT I-STAT 7, (LYTES, BLD GAS, ICA,H+H)
Acid-base deficit: 1 mmol/L (ref 0.0–2.0)
Acid-base deficit: 2 mmol/L (ref 0.0–2.0)
Bicarbonate: 23.8 mmol/L (ref 20.0–28.0)
Bicarbonate: 23.4 mmol/L (ref 20.0–28.0)
Calcium, Ion: 1.22 mmol/L (ref 1.15–1.40)
Calcium, Ion: 1.21 mmol/L (ref 1.15–1.40)
HCT: 32 % — ABNORMAL LOW (ref 39.0–52.0)
HCT: 34 % — ABNORMAL LOW (ref 39.0–52.0)
Hemoglobin: 10.9 g/dL — ABNORMAL LOW (ref 13.0–17.0)
Hemoglobin: 11.6 g/dL — ABNORMAL LOW (ref 13.0–17.0)
O2 Saturation: 96 %
O2 Saturation: 93 %
Patient temperature: 37.2
Patient temperature: 37.6
Potassium: 4.3 mmol/L (ref 3.5–5.1)
Potassium: 4.8 mmol/L (ref 3.5–5.1)
Sodium: 131 mmol/L — ABNORMAL LOW (ref 135–145)
Sodium: 129 mmol/L — ABNORMAL LOW (ref 135–145)
TCO2: 25 mmol/L (ref 22–32)
TCO2: 25 mmol/L (ref 22–32)
pCO2 arterial: 38.8 mmHg (ref 32–48)
pCO2 arterial: 40.4 mmHg (ref 32–48)
pH, Arterial: 7.395 (ref 7.35–7.45)
pH, Arterial: 7.373 (ref 7.35–7.45)
pO2, Arterial: 81 mmHg — ABNORMAL LOW (ref 83–108)
pO2, Arterial: 71 mmHg — ABNORMAL LOW (ref 83–108)

## 2024-01-13 LAB — LACTATE DEHYDROGENASE: LDH: 283 U/L — ABNORMAL HIGH (ref 98–192)

## 2024-01-13 LAB — COMPREHENSIVE METABOLIC PANEL WITH GFR
ALT: 21 U/L (ref 0–44)
AST: 60 U/L — ABNORMAL HIGH (ref 15–41)
Albumin: 3 g/dL — ABNORMAL LOW (ref 3.5–5.0)
Alkaline Phosphatase: 47 U/L (ref 38–126)
Anion gap: 9 (ref 5–15)
BUN: 11 mg/dL (ref 6–20)
CO2: 23 mmol/L (ref 22–32)
Calcium: 8.4 mg/dL — ABNORMAL LOW (ref 8.9–10.3)
Chloride: 95 mmol/L — ABNORMAL LOW (ref 98–111)
Creatinine, Ser: 1.23 mg/dL (ref 0.61–1.24)
GFR, Estimated: >60 mL/min (ref >60)
Glucose, Bld: 126 mg/dL — ABNORMAL HIGH (ref 70–99)
Potassium: 4.7 mmol/L (ref 3.5–5.1)
Sodium: 127 mmol/L — ABNORMAL LOW (ref 135–145)
Total Bilirubin: 1.8 mg/dL — ABNORMAL HIGH (ref 0.0–1.2)
Total Protein: 5.6 g/dL — ABNORMAL LOW (ref 6.5–8.1)

## 2024-01-13 LAB — GLUCOSE, CAPILLARY
Glucose-Capillary: 125 mg/dL — ABNORMAL HIGH (ref 70–99)
Glucose-Capillary: 153 mg/dL — ABNORMAL HIGH (ref 70–99)
Glucose-Capillary: 141 mg/dL — ABNORMAL HIGH (ref 70–99)
Glucose-Capillary: 132 mg/dL — ABNORMAL HIGH (ref 70–99)
Glucose-Capillary: 141 mg/dL — ABNORMAL HIGH (ref 70–99)

## 2024-01-13 LAB — COOXEMETRY PANEL
Carboxyhemoglobin: 2.7 % — ABNORMAL HIGH (ref 0.5–1.5)
Methemoglobin: <0.7 % (ref 0.0–1.5)
O2 Saturation: 72.4 %
Total hemoglobin: 10.9 g/dL — ABNORMAL LOW (ref 12.0–16.0)

## 2024-01-13 LAB — BASIC METABOLIC PANEL WITH GFR
Anion gap: 12 (ref 5–15)
BUN: 11 mg/dL (ref 6–20)
CO2: 21 mmol/L — ABNORMAL LOW (ref 22–32)
Calcium: 8 mg/dL — ABNORMAL LOW (ref 8.9–10.3)
Chloride: 93 mmol/L — ABNORMAL LOW (ref 98–111)
Creatinine, Ser: 1.03 mg/dL (ref 0.61–1.24)
GFR, Estimated: >60 mL/min (ref >60)
Glucose, Bld: 124 mg/dL — ABNORMAL HIGH (ref 70–99)
Potassium: 4.5 mmol/L (ref 3.5–5.1)
Sodium: 126 mmol/L — ABNORMAL LOW (ref 135–145)

## 2024-01-13 LAB — PROTIME-INR
INR: 1.5 — ABNORMAL HIGH (ref 0.8–1.2)
Prothrombin Time: 18.4 s — ABNORMAL HIGH (ref 11.4–15.2)

## 2024-01-13 LAB — PHOSPHORUS: Phosphorus: 3.5 mg/dL (ref 2.5–4.6)

## 2024-01-13 LAB — MAGNESIUM: Magnesium: 1.9 mg/dL (ref 1.7–2.4)

## 2024-01-13 MED ORDER — INSULIN ASPART 100 UNIT/ML IJ SOLN
0.0000 [IU] | Freq: Three times a day (TID) | INTRAMUSCULAR | Status: DC
  Administered 2024-01-13 – 2024-01-16 (×7): 2 [IU] via SUBCUTANEOUS
  Administered 2024-01-16: 8 [IU] via SUBCUTANEOUS
  Administered 2024-01-17: 2 [IU] via SUBCUTANEOUS
  Administered 2024-01-18: 4 [IU] via SUBCUTANEOUS
  Administered 2024-01-19: 2 [IU] via SUBCUTANEOUS
  Administered 2024-01-19: 4 [IU] via SUBCUTANEOUS
  Administered 2024-01-20 – 2024-01-23 (×6): 2 [IU] via SUBCUTANEOUS
  Filled 2024-01-13 (×5): qty 2
  Filled 2024-01-13: qty 12
  Filled 2024-01-13: qty 4
  Filled 2024-01-13 (×3): qty 2
  Filled 2024-01-13: qty 4

## 2024-01-13 MED ORDER — METOLAZONE 5 MG PO TABS
5.0000 mg | ORAL_TABLET | Freq: Once | ORAL | Status: AC
  Administered 2024-01-13: 5 mg via ORAL
  Filled 2024-01-13: qty 1

## 2024-01-13 MED ORDER — WARFARIN SODIUM 2.5 MG PO TABS
2.5000 mg | ORAL_TABLET | Freq: Once | ORAL | Status: AC
  Administered 2024-01-13: 2.5 mg via ORAL
  Filled 2024-01-13: qty 1

## 2024-01-13 MED ORDER — FUROSEMIDE 10 MG/ML IJ SOLN
80.0000 mg | Freq: Once | INTRAMUSCULAR | Status: AC
  Administered 2024-01-13: 80 mg via INTRAVENOUS
  Filled 2024-01-13: qty 8

## 2024-01-13 MED ORDER — MAGNESIUM SULFATE 2 GM/50ML IV SOLN
2.0000 g | Freq: Once | INTRAVENOUS | Status: AC
  Administered 2024-01-13: 2 g via INTRAVENOUS
  Filled 2024-01-13: qty 50

## 2024-01-13 MED ORDER — GABAPENTIN 300 MG PO CAPS
300.0000 mg | ORAL_CAPSULE | Freq: Two times a day (BID) | ORAL | Status: DC
  Administered 2024-01-13 – 2024-01-15 (×5): 300 mg via ORAL
  Filled 2024-01-13 (×5): qty 1

## 2024-01-13 MED ORDER — FUROSEMIDE 10 MG/ML IJ SOLN
10.0000 mg/h | INTRAVENOUS | Status: DC
  Administered 2024-01-13 – 2024-01-15 (×4): 10 mg/h via INTRAVENOUS
  Filled 2024-01-13 (×5): qty 20

## 2024-01-13 NOTE — Progress Notes (Signed)
 Advanced Heart Failure Rounding Note  Chief Complaint: s/p HM3 Implant Subjective:    10/30 S/P HMIII   Milrinone  0.375  + Amio 60/hr. Worsening CVP this morning, suspect RV dysfunction given elevated CVP/PCWP ratio. Will start IV lasix  gtt at 10 for unloading, would continue iNO until CVP <10.  PAP: (41-108)/(18-80) 44/29 CVP:  [7 mmHg-57 mmHg] 12 mmHg CO:  [5.3 L/min-9.8 L/min] 5.3 L/min CI:  [2 L/min/m2-3.7 L/min/m2] 2 L/min/m2  LVAD Interrogation HM III: Speed: 5500 Flow: 5.1 PI: 3.6 Power: 4   Awake on the vent. Follows commands.   Objective:    Vital Signs:   Temp:  [99.1 F (37.3 C)-100.2 F (37.9 C)] 100.2 F (37.9 C) (11/01 2130) Pulse Rate:  [27-85] 82 (11/01 2130) Resp:  [2-40] 21 (11/01 2130) BP: (85-133)/(54-101) 106/81 (11/01 2100) SpO2:  [91 %-99 %] 96 % (11/01 2130) Arterial Line BP: (82-124)/(68-93) 109/87 (11/01 2130) FiO2 (%):  [36 %] 36 % (11/01 1445) Weight:  [151.6 kg] 151.6 kg (11/01 0600) Last BM Date : 01/10/24  Weight change: Filed Weights   01/11/24 0630 01/12/24 0500 01/13/24 0600  Weight: (!) 144.7 kg (!) 158.4 kg (!) 151.6 kg   Intake/Output:  Intake/Output Summary (Last 24 hours) at 01/13/2024 2146 Last data filed at 01/13/2024 2110 Gross per 24 hour  Intake 2214.13 ml  Output 2390 ml  Net -175.87 ml    Physical Exam  Physical Exam: General:  Well appearing. No resp difficulty Cor: Mechanical heart sounds with LVAD hum present. JVP moderately elevated, 1+ edema Lungs: Normal WOB Abdomen: soft, nontender, nondistended.  Driveline: C/D/I; securement device intact and driveline incorporated Neuro: alert & orientedx3, cranial nerves grossly intact. moves all 4 extremities w/o difficulty.     Telemetry: SR with PVCs.  Labs: Basic Metabolic Panel: Recent Labs  Lab 01/11/24 1456 01/11/24 1458 01/11/24 2049 01/12/24 0500 01/12/24 0558 01/12/24 1613 01/12/24 1614 01/13/24 0357 01/13/24 0407 01/13/24 1655  NA 132*   < >  131* 131*   < > 131* 131* 127* 129* 126*  K 4.8   < > 4.6 5.0   < > 4.4 4.3 4.7 4.8 4.5  CL 100  --  98 98  --  98  --  95*  --  93*  CO2 23  --  22 23  --  24  --  23  --  21*  GLUCOSE 168*  --  177* 121*  --  137*  --  126*  --  124*  BUN 19  --  17 15  --  13  --  11  --  11  CREATININE 1.44*  --  1.42* 1.35*  --  1.36*  --  1.23  --  1.03  CALCIUM 7.6*  --  8.0* 8.3*  --  8.4*  --  8.4*  --  8.0*  MG 1.8  --  2.5* 2.4  --  2.2  --  1.9  --   --   PHOS  --   --   --  4.0  --   --   --  3.5  --   --    < > = values in this interval not displayed.   Liver Function Tests: Recent Labs  Lab 01/12/24 0500 01/13/24 0357  AST 68* 60*  ALT 25 21  ALKPHOS 44 47  BILITOT 1.5* 1.8*  PROT 5.5* 5.6*  ALBUMIN 3.3* 3.0*   No results for input(s): LIPASE, AMYLASE in the last 168  hours. No results for input(s): AMMONIA in the last 168 hours.  CBC: Recent Labs  Lab 01/11/24 1456 01/11/24 1458 01/11/24 2049 01/12/24 0500 01/12/24 0558 01/12/24 1613 01/12/24 1614 01/13/24 0357 01/13/24 0407  WBC 16.5*  --  13.1* 14.4*  --  15.3*  --  16.9*  --   NEUTROABS  --   --   --  10.9*  --   --   --  12.2*  --   HGB 10.7*   < > 10.6* 10.9* 10.9* 10.4* 10.9* 10.7* 11.6*  HCT 33.2*   < > 32.9* 33.0* 32.0* 32.6* 32.0* 33.8* 34.0*  MCV 80.2  --  81.0 79.3*  --  80.5  --  81.3  --   PLT 138*  130*  --  128* 123*  --  110*  --  128*  --    < > = values in this interval not displayed.   BNP (last 3 results) Recent Labs    12/25/23 1139 01/05/24 1907 01/12/24 0500  BNP 315.6* 674.8* 623.8*   Medications:    Scheduled Medications:  acetaminophen   1,000 mg Oral Q6H   Or   acetaminophen  (TYLENOL ) oral liquid 160 mg/5 mL  1,000 mg Per Tube Q6H   bisacodyl  10 mg Oral Daily   Or   bisacodyl  10 mg Rectal Daily   Chlorhexidine  Gluconate Cloth  6 each Topical Daily   docusate sodium  200 mg Oral Daily   ezetimibe   10 mg Oral Daily   gabapentin  300 mg Oral BID   insulin  aspart  0-24  Units Subcutaneous TID WC   insulin  glargine-yfgn  18 Units Subcutaneous Daily   metolazone   5 mg Oral Once   mexiletine  150 mg Oral Q8H   pantoprazole  40 mg Oral Daily   sodium chloride  flush  3 mL Intravenous Q12H   Warfarin - Physician Dosing Inpatient   Does not apply q1600    Infusions:  amiodarone  60 mg/hr (01/13/24 2100)   epinephrine Stopped (01/13/24 0845)   furosemide  (LASIX ) 200 mg in dextrose  5 % 100 mL (2 mg/mL) infusion 10 mg/hr (01/13/24 2100)   milrinone  0.375 mcg/kg/min (01/13/24 2106)    PRN Medications: dextrose , morphine  injection, ondansetron  (ZOFRAN ) IV, mouth rinse, oxyCODONE , sodium chloride  flush, traMADol   Plan/Discussion:    1. Chronic systolic heart failure: - combined ischemic and nonischemic: -Mentions of compaction CM from [facility] cards notes. PVCs may also contribute, 25.3% PVCs with NSVT on Zio monitor in 5/25 - RHC 10/25 with low filling pressures and reduced CI (1.9 TD, 2.2 Fick). - NYHA class IV symptoms on admission.  - S/P HM3 LVAD Implant - intra-op TEE with normal RV function (per anesthesia report). Received 4FFP, 650 CS intra-op - Remains on  milrinone  0.375. CVP rising, start aggressive IV diuresis. Will continue speed for now, but low threshold to reduce if worsening hemodynamics - Continue wafarin, INR 1.5 - Suspect with elevated bilirubin he is moderately congested, diuresis as above -wean iNO after CVP improved    2 CAD:   - LHC 5/24 at [facility]: severe multivessel CAD including 80% mid RCA, 100% RPDA, 100% mid LCx, 80% OM1, 80% mid-distal LAD.  - LHC 9/25: occluded PDA and mid AV LCx, 90% ramus, severe diffuse disease in right PLV, nonobstructive LAD disease. With markedly low EF and without severe LAD disease, CABG nor a durable option.  - No ASA given anticoagulation use and stable CAD.  - Restart zetia   3. PVCs:  - Zio 5/25 with 25.3% (prev did not tolerate amiodarone  well, on mexiletine) - increased burden post-op, now  improving - continue amio 60/hr for now - Continue mexilitine 150 mg every 8 hours.     Length of Stay: 8 CRITICAL CARE Performed by: Morene JINNY Brownie   Total critical care time: 39 minutes  Critical care time was exclusive of separately billable procedures and treating other patients.  Critical care was necessary to treat or prevent imminent or life-threatening deterioration.  Critical care was time spent personally by me on the following activities: development of treatment plan with patient and/or surrogate as well as nursing, discussions with consultants, evaluation of patient's response to treatment, examination of patient, obtaining history from patient or surrogate, ordering and performing treatments and interventions, ordering and review of laboratory studies, ordering and review of radiographic studies, pulse oximetry and re-evaluation of patient's condition.

## 2024-01-13 NOTE — Progress Notes (Signed)
 Daily Progress Note   Patient Name: Philip Monroe       Date: 01/13/2024 DOB: 05-Nov-1975  Age: 48 y.o. MRN#: [id number] Attending Physician: Daniel Con RAMAN, MD Primary Care Physician: Pcp, No Admit Date: 01/05/2024  Reason for Consultation/Follow-up: Other: LVAD   Length of Stay: 8  Current Medications: Scheduled Meds:   acetaminophen   1,000 mg Oral Q6H   Or   acetaminophen  (TYLENOL ) oral liquid 160 mg/5 mL  1,000 mg Per Tube Q6H   bisacodyl  10 mg Oral Daily   Or   bisacodyl  10 mg Rectal Daily   Chlorhexidine  Gluconate Cloth  6 each Topical Daily   docusate sodium  200 mg Oral Daily   ezetimibe   10 mg Oral Daily   gabapentin  300 mg Oral BID   insulin  aspart  0-24 Units Subcutaneous TID WC   insulin  glargine-yfgn  18 Units Subcutaneous Daily   mexiletine  150 mg Oral Q8H   pantoprazole  40 mg Oral Daily   sodium chloride  flush  3 mL Intravenous Q12H   warfarin  2.5 mg Oral ONCE-1600   Warfarin - Physician Dosing Inpatient   Does not apply q1600    Continuous Infusions:  amiodarone  60 mg/hr (01/13/24 1315)   epinephrine Stopped (01/13/24 0845)   furosemide  (LASIX ) 200 mg in dextrose  5 % 100 mL (2 mg/mL) infusion 10 mg/hr (01/13/24 1300)   milrinone  0.375 mcg/kg/min (01/13/24 1315)    PRN Meds: dextrose , morphine  injection, ondansetron  (ZOFRAN ) IV, mouth rinse, oxyCODONE , sodium chloride  flush, traMADol  Physical Exam Vitals reviewed.  Constitutional:      General: He is not in acute distress. HENT:     Head: Normocephalic and atraumatic.  Cardiovascular:     Rate and Rhythm: Normal rate.  Pulmonary:     Effort: Pulmonary effort is normal.  Skin:    General: Skin is warm and dry.  Neurological:     Mental Status: He is alert and oriented to person, place, and  time.  Psychiatric:        Mood and Affect: Mood normal.        Behavior: Behavior normal.             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   Acute exacerbation of CHF (congestive heart failure) (HCC) 11/24/2023   Effusion of right knee 09/25/2023   Ischemic cardiomyopathy 05/21/2023   Demand ischemia (HCC) 05/20/2023   Elevated troponin 05/20/2023   CKD (chronic kidney disease), stage II    Hyperlipidemia LDL goal <70    Morbid obesity (HCC)    PVC's (premature ventricular contractions)    NSTEMI (non-ST elevated myocardial infarction) (HCC) 05/19/2023   Acute on chronic combined systolic and diastolic CHF (congestive heart failure) (HCC) 05/19/2023   Tobacco use disorder 07/19/2022   Coronary artery disease involving native coronary artery of native heart without angina pectoris 09/10/2018   Non-ischemic cardiomyopathy (HCC) 08/23/2017   Chronic systolic heart failure (HCC) 04/20/2015   Hypertension 04/20/2015   Alcohol use 04/20/2015   Acute on chronic congestive heart failure (HCC) 03/31/2015    Palliative Care Assessment & Plan   Patient Profile: 48 y.o. male  with past medical history of multivessel CAD, chronic HFrEF, noncompaction CM, HTN, and previous ETOH use admitted on 01/05/2024 after RHC for HF optimization, dental extraction (10/27) and HM III VAD implantation on 01/11/2024.   Today's Discussion: Reviewed chart and received update from nursing. Patient had LVAD 10/30.  Patient sitting up in recliner. He states it was difficult to get into the recliner but it is good to  be sitting up. 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  He is hopeful he will get a little stronger each day post op. We discussed using his incentive spirometer which was on his bedside table. His family have been visiting.   He is glad PMT chaplain was able to assist with [facility] document and for support. Encouraged patient to call PMT with questions or concerns. PMT will continue to follow.  Recommendations/Plan: LVAD placed 10/30 Spiritual care for support PMT will follow peripherally    Extensive chart review has been completed prior to seeing the patient including labs, vital signs, imaging, progress/consult notes, orders, medications, and available advance directive documents.  Care plan was discussed with bedside RN  Time spent: 25 minutes  Thank you for allowing the Palliative Medicine Team to assist in the care of this patient.   [name] CHRISTELLA Palin, NP  Please contact Palliative Medicine Team phone at [phone] for questions and concerns.

## 2024-01-13 NOTE — Progress Notes (Signed)
 NAME:  Philip Monroe, MRN:  [id number], DOB:  [birth year], LOS: 8 ADMISSION DATE:  01/05/2024, CONSULTATION DATE: 10/30  REFERRING MD:  MD Su CHIEF COMPLAINT:  LVAD  History of Present Illness:  Patient is a 48 year old male with significant past medical history of chronic HFrEF < 20%, CAD multivessel (recent [facility] 5/24), followed in past by [facility] Cards for iCM/noncompaction CM, pulmonary HTN, HTN, CKD stage III, diabetes type 2, HLD, hx of ETOH use, multiple recent admission of CHF exacerbations- most recent in 9/25 who was admitted on 01/05/24 for HF optimization, and also teeth extraction x 4 (occurred on 10/27) for LVAD surgery on 01/11/24 by MD Su with TCTS. PCCM consulted to assist with post-op vent management as well as critical care needs during ICU stay.   Estimated blood loss 800 mL  4 of FFP given Protamine given for heparin  reversal DDAVP  Pertinent  Medical History   Past Medical History:  Diagnosis Date   Aortic root dilatation    a. 07/2022 Echo: Ao root 4.4cm, Asc Ao 4.0cm.   Asthma    as a child, no problems as an adult, rarely uses inhaler   CAD (coronary artery disease)    a. 08/2017 Cath: LM nl, LAD 36m/d, D1 70ost, LCX 123m, OM1 90 inf branch, 60 sup branch, OM2 small, RCA 30 diffuse, RPDA 100 w/ L->R collats; b. 07/2022 Echo: LM nl, LAD 26m, D1 80, LCX 11m, OM1 80, RCA 24m, RPDA 100-->No targets for PCI/CABG-->Med rx.   CHF (congestive heart failure) (HCC)    takes lasix    Chronic HFrEF (heart failure with reduced ejection fraction) (HCC)    a. 03/2015 Echo: EF 35-40%; b. 08/2017 Echo: EF 15%; c. 08/2017 cMRI: EF 35%; d. 06/2018 Echo: EF 10-15%; e. 07/2022 Echo: EF 15-20%, grIII DD, mild MR, sev dil LA, mod-sev PH, Ao root 4.4cm, Asc Ao 4.0cm; f. 07/2022 RHC: RA 4, RV 38/3, PA 43/18 (26), PCWP 11. CO/CI 5.3/1.8.   CKD (chronic kidney disease), stage II    Diabetes mellitus without complication (HCC)    type 2   Dyspnea    with exertion   Gout    Hyperlipidemia LDL goal  <70    Hypertension    Ischemic cardiomyopathy    a. 03/2015 Echo: EF 35-40%; b. 08/2017 Echo: EF 15%; c. 08/2017 cMRI: EF 35%; d. 06/2018 Echo: EF 10-15%; e. 07/2022 Echo: EF 15-20%, grIII DD.   IVCD (intraventricular conduction defect)    Left ventricular noncompaction [facility])    Morbid obesity (HCC)    NSVT (nonsustained ventricular tachycardia) (HCC)    a. 09/2022 Event monitor: predominantly sinus rhythm, avg HR 81 (46-167). 17 runs NSVT (fastest/longest 13 beats x 167). Rare PACs, 17.5% PVC burden.   PVC's (premature ventricular contractions)    a. 09/2022 Event Monitor: 17.5% PVC burden.   Statin intolerance    Tobacco abuse      Significant Hospital Events: Including procedures, antibiotic start and stop dates in addition to other pertinent events   10/24 Admit for HF optimization for LVAD surgery on 10/30 10/27 Teeth Extraction x 4 by oral surgery  10/30 HM3 LVAD placement, PCCM consult for post-op management   Interim History / Subjective:  Patient looks volume overloaded this morning.  Otherwise is alert and oriented.  No acute distress.  Epinephrine is stopped this morning.  Coox 72.4 LDH 283  milrinone  0.375  Amio 60   Cr 1.03 Na 126 wbc 16.9 hgb 11.6 plt 128  Objective    Blood pressure 96/84, pulse (!) 47, temperature 99.1 F (37.3 C), resp. rate (!) 36, height 6' 3 (1.905 m), weight (!) 151.6 kg, SpO2 97%. PAP: (29-108)/(15-80) 56/30 CVP:  [6 mmHg-57 mmHg] 14 mmHg CO:  [5.4 L/min-9.8 L/min] 7.1 L/min CI:  [2 L/min/m2-3.7 L/min/m2] 2.7 L/min/m2  FiO2 (%):  [36 %] 36 %   Intake/Output Summary (Last 24 hours) at 01/13/2024 1411 Last data filed at 01/13/2024 1300 Gross per 24 hour  Intake 2089.8 ml  Output 2125 ml  Net -35.2 ml   Filed Weights   01/11/24 0630 01/12/24 0500 01/13/24 0600  Weight: (!) 144.7 kg (!) 158.4 kg (!) 151.6 kg    Examination: General: chronically and critically ill man.  He is alert no acute distress. HENT: Atraumatic normocephalic  both pupils equal reactive to light. Lungs: Lungs bilaterally.  On 4 L oxygen. Cardiovascular: a paced.  S1 S2 heard no S3.  LVAD hum  Abdomen: Soft nontender Extremities: no cyanosis or clubbing pedal edema present Neuro: Awake oriented x 3 moving all 4 extremities GU: foley  Resolved problem list   Assessment and Plan   Acute hypoxic respiratory failure postoperatively P - Extubated 10/31 - On nitric oxide - Currently on 4 L nasal cannula.  Chronic systolic HF s/p LVAD HM III  mvCAD Mild pHTN PVCs with prior poor amio tolerance  Hx HTN  Hx HLD  P -post op, surgical care per CVTS -complete ppx abx  -adv HF following, recs appreciated -starting Lasix  drip today stopped epinephrine. -follow coox ldh -milrinone , continue -iNO per HF.  -amio continue - On mexitil   - Will bolus insulin  -coumadin to start 10/31 night  -statin, no ASA w AC above  -lasix     CKD II -follow renal indices UOP   Expected ABLA, thrombocytopenia  P -follow CBC   Morbid obesity -no acute intervention   S/p Teeth Extraction 10/27  -Per oral surgery  Gout  -resume home meds when appropriate    Labs   CBC: Recent Labs  Lab 01/11/24 1456 01/11/24 1458 01/11/24 2049 01/12/24 0500 01/12/24 0558 01/12/24 1613 01/12/24 1614 01/13/24 0357 01/13/24 0407  WBC 16.5*  --  13.1* 14.4*  --  15.3*  --  16.9*  --   NEUTROABS  --   --   --  10.9*  --   --   --  12.2*  --   HGB 10.7*   < > 10.6* 10.9* 10.9* 10.4* 10.9* 10.7* 11.6*  HCT 33.2*   < > 32.9* 33.0* 32.0* 32.6* 32.0* 33.8* 34.0*  MCV 80.2  --  81.0 79.3*  --  80.5  --  81.3  --   PLT 138*  130*  --  128* 123*  --  110*  --  128*  --    < > = values in this interval not displayed.    Basic Metabolic Panel: Recent Labs  Lab 01/11/24 1456 01/11/24 1458 01/11/24 2049 01/12/24 0500 01/12/24 0558 01/12/24 1613 01/12/24 1614 01/13/24 0357 01/13/24 0407  NA 132*   < > 131* 131* 133* 131* 131* 127* 129*  K 4.8   < > 4.6 5.0  5.0 4.4 4.3 4.7 4.8  CL 100  --  98 98  --  98  --  95*  --   CO2 23  --  22 23  --  24  --  23  --   GLUCOSE 168*  --  177* 121*  --  137*  --  126*  --   BUN 19  --  17 15  --  13  --  11  --   CREATININE 1.44*  --  1.42* 1.35*  --  1.36*  --  1.23  --   CALCIUM 7.6*  --  8.0* 8.3*  --  8.4*  --  8.4*  --   MG 1.8  --  2.5* 2.4  --  2.2  --  1.9  --   PHOS  --   --   --  4.0  --   --   --  3.5  --    < > = values in this interval not displayed.   GFR: Estimated Creatinine Clearance: 116.9 mL/min (by C-G formula based on SCr of 1.23 mg/dL). Recent Labs  Lab 01/11/24 2049 01/12/24 0500 01/12/24 1613 01/13/24 0357  WBC 13.1* 14.4* 15.3* 16.9*    Liver Function Tests: Recent Labs  Lab 01/12/24 0500 01/13/24 0357  AST 68* 60*  ALT 25 21  ALKPHOS 44 47  BILITOT 1.5* 1.8*  PROT 5.5* 5.6*  ALBUMIN 3.3* 3.0*   No results for input(s): LIPASE, AMYLASE in the last 168 hours. No results for input(s): AMMONIA in the last 168 hours.  ABG    Component Value Date/Time   PHART 7.373 01/13/2024 0407   PCO2ART 40.4 01/13/2024 0407   PO2ART 71 (L) 01/13/2024 0407   HCO3 23.4 01/13/2024 0407   TCO2 25 01/13/2024 0407   ACIDBASEDEF 2.0 01/13/2024 0407   O2SAT 72.4 01/13/2024 0505     Coagulation Profile: Recent Labs  Lab 01/11/24 1242 01/11/24 1456 01/12/24 0500 01/13/24 0357  INR 1.3* 1.2  1.2 1.2 1.5*    Cardiac Enzymes: No results for input(s): CKTOTAL, CKMB, CKMBINDEX, TROPONINI in the last 168 hours.  HbA1C: Hgb A1c MFr Bld  Date/Time Value Ref Range Status  11/29/2023 04:45 AM 6.6 (H) 4.8 - 5.6 % Final    Comment:    (NOTE) Diagnosis of Diabetes The following HbA1c ranges recommended by the American Diabetes Association (ADA) may be used as an aid in the diagnosis of diabetes mellitus.  Hemoglobin             Suggested A1C NGSP%              Diagnosis  <5.7                   Non Diabetic  5.7-6.4                Pre-Diabetic  >6.4                    Diabetic  <7.0                   Glycemic control for                       adults with diabetes.      CBG: Recent Labs  Lab 01/12/24 2013 01/12/24 2303 01/13/24 0328 01/13/24 0753 01/13/24 1141  GLUCAP 106* 163* 125* 153* 141*   CRITICAL CARE Performed by: Tamela Stakes   Total critical care time:  35 minutes  Tamela Stakes, MD  Attending Physician, Critical Care Medicine [place] [facility] See Amion for pager If no response to pager, please call [phone] until 7pm After 7pm, Please call E-link [phone]

## 2024-01-13 NOTE — Plan of Care (Signed)
  Problem: Education: Goal: Knowledge of the prescribed therapeutic regimen will improve Outcome: Progressing   Problem: Activity: Goal: Risk for activity intolerance will decrease Outcome: Progressing   Problem: Cardiac: Goal: Ability to maintain an adequate cardiac output will improve Outcome: Progressing   Problem: Coping: Goal: Level of anxiety will decrease Outcome: Progressing   Problem: Clinical Measurements: Goal: Ability to maintain clinical measurements within normal limits will improve 01/13/2024 0520 by Geryl Adolphus BROCKS, RN Outcome: Progressing 01/13/2024 0519 by Geryl Adolphus BROCKS, RN Outcome: Progressing Goal: Will remain free from infection 01/13/2024 0520 by Geryl Adolphus BROCKS, RN Outcome: Progressing 01/13/2024 0519 by Geryl Adolphus BROCKS, RN Outcome: Progressing   Problem: Respiratory: Goal: Will regain and/or maintain adequate ventilation 01/13/2024 0520 by Geryl Adolphus BROCKS, RN Outcome: Progressing 01/13/2024 0519 by Geryl Adolphus BROCKS, RN Outcome: Progressing   Problem: Education: Goal: Knowledge of the prescribed therapeutic regimen will improve 01/13/2024 0520 by Geryl Adolphus BROCKS, RN Outcome: Progressing 01/13/2024 0519 by Geryl Adolphus BROCKS, RN Outcome: Progressing   Problem: Cardiac: Goal: Ability to maintain an adequate cardiac output will improve 01/13/2024 0520 by Geryl Adolphus BROCKS, RN Outcome: Progressing 01/13/2024 0519 by Geryl Adolphus BROCKS, RN Outcome: Progressing   Problem: Coping: Goal: Level of anxiety will decrease 01/13/2024 0520 by Geryl Adolphus BROCKS, RN Outcome: Progressing 01/13/2024 0519 by Geryl Adolphus BROCKS, RN Outcome: Progressing

## 2024-01-13 NOTE — Evaluation (Signed)
 Physical Therapy Re-Evaluation Patient Details Name: Philip Monroe MRN: [id number] DOB: [birth year] Today's Date: 01/13/2024  History of Present Illness  Pt is a 48 y.o. male who presented 10/24 for right heart cath with plan for LVAD implantation. S/p dental teeth extraction 10/27. S/p Implantation of HeartMate 3 Left Ventricular Assist Device 10/30 PMH: aortic root dilatation, asthma, CAD, CHF, CKD stage II, gout, DM, HLD, HTN, IVCD, morbid obesity, left ventricular noncompation, NSVT, tobacco abuse.   Clinical Impression  Patient is reevaluated following LVAD implantation 10/30. Required min assist +2 for bed mobility and transfer out of bed. Tolerated standing approx 4 minutes without subjective dizziness (VSS), and step pivot transfer with hand held support. Reviewed LE exercises to perform in bed and chair for circulation and strength preservation. Good family support at d/c reported. Will progress as tolerated and update recs as indicated. Patient will benefit from acute skilled PT to increase their independence and safety with mobility to facilitate discharge.         If plan is discharge home, recommend the following: Two people to help with walking and/or transfers;Two people to help with bathing/dressing/bathroom;Assistance with cooking/housework;Assist for transportation;Help with stairs or ramp for entrance   Can travel by private vehicle        Equipment Recommendations BSC/3in1;Rollator (4 wheels) (Pending progress)  Recommendations for Other Services       Functional Status Assessment Patient has had a recent decline in their functional status and demonstrates the ability to make significant improvements in function in a reasonable and predictable amount of time.     Precautions / Restrictions Precautions Precautions: Sternal;Fall Precaution Booklet Issued: Yes (comment) (Received pre-op) Recall of Precautions/Restrictions: Impaired Precaution/Restrictions Comments:  LVAD, swan, a-line, chest tube, wound vac Restrictions Weight Bearing Restrictions Per Provider Order: Yes RUE Weight Bearing Per Provider Order: Non weight bearing LUE Weight Bearing Per Provider Order: Non weight bearing Other Position/Activity Restrictions: Cardiac Sternal Precautions      Mobility  Bed Mobility Overal bed mobility: Needs Assistance Bed Mobility: Supine to Sit     Supine to sit: Min assist, HOB elevated, +2 for physical assistance, +2 for safety/equipment     General bed mobility comments: Min assist +2 for trunk support and equpiment management. Able to sequence LEs off bed and turn body while holding heart pillow for comfort and precaution reinforcement. Cues for technique throughout. Mild dizziness upon rising but resolved.    Transfers Overall transfer level: Needs assistance Equipment used: 1 person hand held assist Transfers: Sit to/from Stand, Bed to chair/wheelchair/BSC Sit to Stand: Min assist, +2 physical assistance, +2 safety/equipment, From elevated surface   Step pivot transfers: Min assist, +2 safety/equipment       General transfer comment: Min assist +2 for boost and management of lines/leads and equipment. Performed from bed slightly elevated, and descended into recliner with light assist for control. Tolerated step pivot transfer with min assist for balance, cues for sequencing. No buckling noted.    Ambulation/Gait               General Gait Details: Deferred with swan ganz until cleared by Medical team.  Stairs            Wheelchair Mobility     Tilt Bed    Modified Rankin (Stroke Patients Only)       Balance Overall balance assessment: Needs assistance Sitting-balance support: No upper extremity supported, Feet supported Sitting balance-Leahy Scale: Fair     Standing balance support: Single extremity supported  Standing balance-Leahy Scale: Poor Standing balance comment: Hand held support in standing.                              Pertinent Vitals/Pain Pain Assessment Pain Assessment: Faces Faces Pain Scale: Hurts whole lot Pain Location: sternum Pain Descriptors / Indicators: Aching, Operative site guarding Pain Intervention(s): Limited activity within patient's tolerance, Monitored during session, Repositioned    Home Living Family/patient expects to be discharged to:: Private residence Living Arrangements: Children;Parent (adult daughter) Available Help at Discharge: Family;Available 24 hours/day Type of Home: Mobile home Home Access: Stairs to enter Entrance Stairs-Rails: Can reach both Entrance Stairs-Number of Steps: 3   Home Layout: One level Home Equipment: Crutches      Prior Function Prior Level of Function : Independent/Modified Independent             Mobility Comments: uses crutches when he has gout flair up but otherwise no AD 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) Extremity Assessment Upper Extremity Assessment: Defer to OT evaluation    Lower Extremity Assessment Lower Extremity Assessment: Generalized weakness    Cervical / Trunk Assessment Cervical / Trunk Assessment: Normal  Communication   Communication Communication: No apparent difficulties    Cognition Arousal: Alert Behavior During Therapy: WFL for tasks assessed/performed   PT - Cognitive impairments: No apparent impairments                         Following commands: Intact       Cueing Cueing Techniques: Verbal cues     General Comments General comments (skin integrity, edema, etc.): SpO2 94% on 4L + iNO. HR 90s, MAP 91 via a-line. LVAD: 5500 RPM, 4.6 LPM, 4.3 W.    Exercises General Exercises - Lower Extremity Ankle Circles/Pumps: AROM, Both, 10 reps, Supine Quad Sets: Strengthening, Both, 10 reps, Supine Gluteal Sets: Strengthening, Both, 10 reps, Seated Long Arc Quad: Strengthening, Both, 10 reps,  Seated   Assessment/Plan    PT Assessment Patient needs continued PT services  PT Problem List Decreased activity tolerance;Cardiopulmonary status limiting activity;Decreased strength;Decreased range of motion;Decreased balance;Decreased mobility;Decreased knowledge of use of DME;Decreased knowledge of precautions;Obesity;Pain       PT Treatment Interventions Gait training;Stair training;Functional mobility training;Therapeutic activities;Therapeutic exercise;Balance training;Neuromuscular re-education;Patient/family education;Modalities    PT Goals (Current goals can be found in the Care Plan section)  Acute Rehab PT Goals Patient Stated Goal: Reduce pain, improve strength PT Goal Formulation: With patient Time For Goal Achievement: 01/27/24 Potential to Achieve Goals: Good    Frequency Min 3X/week     Co-evaluation               AM-PAC PT 6 Clicks Mobility  Outcome Measure Help needed turning from your back to your side while in a flat bed without using bedrails?: A Little Help needed moving from lying on your back to sitting on the side of a flat bed without using bedrails?: A Little Help needed moving to and from a bed to a chair (including a wheelchair)?: A Lot Help needed standing up from a chair using your arms (e.g., wheelchair or bedside chair)?: A Lot Help needed to walk in hospital room?: A Lot Help needed climbing 3-5 steps with a railing? : Total 6 Click Score: 13    End of Session Equipment Utilized During Treatment: Oxygen Activity Tolerance: Patient tolerated treatment well  Patient left: with call bell/phone within reach;in chair;with chair alarm set Nurse Communication: Mobility status PT Visit Diagnosis: Difficulty in walking, not elsewhere classified (R26.2);Unsteadiness on feet (R26.81);Other abnormalities of gait and mobility (R26.89);Muscle weakness (generalized) (M62.81);Pain Pain - part of body:  (Sternum)    Time: 8953-8886 PT Time  Calculation (min) (ACUTE ONLY): 27 min   Charges:   PT Evaluation $PT Re-evaluation: 1 Re-eval PT Treatments $Therapeutic Exercise: 8-22 mins $Therapeutic Activity: 8-22 mins PT General Charges $$ ACUTE PT VISIT: 1 Visit         Leontine Roads, PT, DPT [facility]  Rehabilitation Services Physical Therapist Office: [phone] Website: [email or web address]   Leontine GORMAN Roads 01/13/2024, 2:00 PM

## 2024-01-13 NOTE — Progress Notes (Signed)
 TCTS Evening Rounds:  Hemodynamically stable in sinus rhythm. SVO2 74, CI 2.2, CVP 12 on milrinone  0.375  Diuresing fairly well on lasix  drip 10 started this afternoon.  BMET    Component Value Date/Time   NA 126 (L) 01/13/2024 1655   K 4.5 01/13/2024 1655   CL 93 (L) 01/13/2024 1655   CO2 21 (L) 01/13/2024 1655   GLUCOSE 124 (H) 01/13/2024 1655   BUN 11 01/13/2024 1655   CREATININE 1.03 01/13/2024 1655   CALCIUM 8.0 (L) 01/13/2024 1655   GFRNONAA >60 01/13/2024 1655   Up in chair.

## 2024-01-13 NOTE — Progress Notes (Signed)
 HeartMate 3 Rounding Note  Subjective:    Complains of chest wall pain and feeling hot.  He has been hemodynamically stable overnight on Milrinone  0.375, epi 1. NO per [place].  Co-ox 72.4, SVO2 76, CVP 15.  -64 cc/24 hrs.    LVAD INTERROGATION:  HeartMate IIl LVAD:  Flow 5.0 liters/min, speed 5500, power 4.1, PI 4.2.    Objective:    Vital Signs:   Temp:  [98.8 F (37.1 C)-100.6 F (38.1 C)] 99.5 F (37.5 C) (11/01 0830) Pulse Rate:  [26-119] 35 (11/01 0830) Resp:  [2-40] 23 (11/01 0830) BP: (69-111)/(39-92) 109/87 (11/01 0800) SpO2:  [88 %-100 %] 96 % (11/01 0830) Arterial Line BP: (64-123)/(52-89) 117/79 (11/01 0830) FiO2 (%):  [40 %] 40 % (10/31 1150) Weight:  [151.6 kg] 151.6 kg (11/01 0600) Last BM Date : 01/10/24 Mean arterial Pressure 80's  Intake/Output:   Intake/Output Summary (Last 24 hours) at 01/13/2024 0933 Last data filed at 01/13/2024 0800 Gross per 24 hour  Intake 2607.51 ml  Output 2830 ml  Net -222.49 ml     Physical Exam: General:  Well appearing. No resp difficulty but shallow breathing. HEENT: normal Neck: normal Cor: Distant heart sounds with LVAD hum present. Lungs: clear Abdomen: soft, nontender, nondistended. Good bowel sounds. Extremities: mild edema Neuro: alert & orientedx3, cranial nerves grossly intact. moves all 4 extremities w/o difficulty. Affect pleasant  Telemetry: sinus or junctional 80's with premature beats on amio 60.  Labs: Basic Metabolic Panel: Recent Labs  Lab 01/11/24 1456 01/11/24 1458 01/11/24 2049 01/12/24 0500 01/12/24 0558 01/12/24 1613 01/12/24 1614 01/13/24 0357 01/13/24 0407  NA 132*   < > 131* 131* 133* 131* 131* 127* 129*  K 4.8   < > 4.6 5.0 5.0 4.4 4.3 4.7 4.8  CL 100  --  98 98  --  98  --  95*  --   CO2 23  --  22 23  --  24  --  23  --   GLUCOSE 168*  --  177* 121*  --  137*  --  126*  --   BUN 19  --  17 15  --  13  --  11  --   CREATININE 1.44*  --  1.42* 1.35*  --  1.36*  --  1.23  --    CALCIUM 7.6*  --  8.0* 8.3*  --  8.4*  --  8.4*  --   MG 1.8  --  2.5* 2.4  --  2.2  --  1.9  --   PHOS  --   --   --  4.0  --   --   --  3.5  --    < > = values in this interval not displayed.    Liver Function Tests: Recent Labs  Lab 01/12/24 0500 01/13/24 0357  AST 68* 60*  ALT 25 21  ALKPHOS 44 47  BILITOT 1.5* 1.8*  PROT 5.5* 5.6*  ALBUMIN 3.3* 3.0*   No results for input(s): LIPASE, AMYLASE in the last 168 hours. No results for input(s): AMMONIA in the last 168 hours.  CBC: Recent Labs  Lab 01/11/24 1456 01/11/24 1458 01/11/24 2049 01/12/24 0500 01/12/24 0558 01/12/24 1613 01/12/24 1614 01/13/24 0357 01/13/24 0407  WBC 16.5*  --  13.1* 14.4*  --  15.3*  --  16.9*  --   NEUTROABS  --   --   --  10.9*  --   --   --  12.2*  --   HGB 10.7*   < > 10.6* 10.9* 10.9* 10.4* 10.9* 10.7* 11.6*  HCT 33.2*   < > 32.9* 33.0* 32.0* 32.6* 32.0* 33.8* 34.0*  MCV 80.2  --  81.0 79.3*  --  80.5  --  81.3  --   PLT 138*  130*  --  128* 123*  --  110*  --  128*  --    < > = values in this interval not displayed.    INR: Recent Labs  Lab 01/11/24 1242 01/11/24 1456 01/12/24 0500 01/13/24 0357  INR 1.3* 1.2  1.2 1.2 1.5*    Other results: EKG:   Imaging: DG Chest Port 1 View Result Date: 01/12/2024 EXAM: 1 VIEW(S) XRAY OF THE CHEST 01/12/2024 06:48:00 AM COMPARISON: 01/11/2024 CLINICAL HISTORY: Status post cardiac surgery [id number] FINDINGS: LINES, TUBES AND DEVICES: Right IJ CVC in place with tip over the right main artery. ETT in place with tip 5 cm above the carina. Enteric tube courses below diaphragm with side hole poorly visualized and tip out of field of view. Left chest tube and mediastinal drain in place. LVAD in place. LUNGS AND PLEURA: Bilateral pleural effusions. New bibasilar hazy opacities. Mild pulmonary edema. No pneumothorax. HEART AND MEDIASTINUM: Unchanged cardiomegaly. BONES AND SOFT TISSUES: Median sternotomy changes. No acute osseous abnormality.  IMPRESSION: 1. New bibasilar hazy opacities. 2. Increased pulmonary edema. 3. Bilateral pleural effusions. 4. Support devices as described. Electronically signed by: Norman Gatlin MD 01/12/2024 01:43 PM EDT RP Workstation: [id number]   DG Abd Portable 1V Result Date: 01/11/2024 EXAM: 1 VIEW XRAY OF THE ABDOMEN 01/11/2024 03:25:00 PM COMPARISON: None available. CLINICAL HISTORY: [id number] Encounter for orogastric (OG) tube placement [id number] Encounter for orogastric (OG) tube placement FINDINGS: LINES, TUBES AND DEVICES: OG tube tip is in the proximal stomach. BOWEL: Nonobstructive bowel gas pattern. SOFT TISSUES: No opaque urinary calculi. BONES: No acute osseous abnormality. IMPRESSION: 1. OG tube tip is in the proximal stomach. Electronically signed by: Franky Crease MD 01/11/2024 03:44 PM EDT RP Workstation: [id number]   DG Chest Port 1 View Result Date: 01/11/2024 EXAM: 1 VIEW(S) XRAY OF THE CHEST 01/11/2024 03:23:32 PM COMPARISON: Comparison 01/10/2024. CLINICAL HISTORY: Status post cardiac surgery. FINDINGS: LINES, TUBES AND DEVICES: Interval placement of LVAD. Endotracheal tube is 4 cm above the carina. Left chest tube in place. Swan-Ganz catheter tip in the central right pulmonary artery. NG tube enters the stomach. LUNGS AND PLEURA: Vascular congestion. No focal pulmonary opacity. No pulmonary edema. No pleural effusion. No pneumothorax. HEART AND MEDIASTINUM: Cardiomegaly. BONES AND SOFT TISSUES: No acute osseous abnormality. IMPRESSION: 1. Cardiomegaly with vascular congestion. 2. Interval placement of LVAD, left chest tube, Swan-Ganz catheter, endotracheal tube, and NG tube. Electronically signed by: Kevin Dover MD 01/11/2024 03:44 PM EDT RP Workstation: [id number]     Medications:     Scheduled Medications:  acetaminophen   1,000 mg Oral Q6H   Or   acetaminophen  (TYLENOL ) oral liquid 160 mg/5 mL  1,000 mg Per Tube Q6H   bisacodyl  10 mg Oral Daily   Or   bisacodyl  10 mg Rectal Daily    Chlorhexidine  Gluconate Cloth  6 each Topical Daily   docusate sodium  200 mg Oral Daily   ezetimibe   10 mg Oral Daily   gabapentin  200 mg Oral QHS   insulin  aspart  0-24 Units Subcutaneous Q4H   insulin  glargine-yfgn  18 Units Subcutaneous Daily   mexiletine  150 mg Oral Q8H   pantoprazole  40 mg Oral Daily   sodium chloride  flush  3 mL Intravenous Q12H   warfarin  2.5 mg Oral ONCE-1600   Warfarin - Physician Dosing Inpatient   Does not apply q1600    Infusions:  amiodarone  60 mg/hr (01/13/24 0800)    ceFAZolin (ANCEF) IV Stopped (01/13/24 9341)   epinephrine 1 mcg/min (01/13/24 0800)   milrinone  0.375 mcg/kg/min (01/13/24 0800)    PRN Medications: dextrose , morphine  injection, ondansetron  (ZOFRAN ) IV, mouth rinse, oxyCODONE , sodium chloride  flush, traMADol   Assessment:   POD 2 HM 3 LVAD for 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 Low normal RV systolic function on echo with moderate MR.  Severe multivessel CAD.  PVC's recorded at 25.3 % on Zio in May 2025. He had been on Mexiletine due to not tolerating amio very well. He is now on both postop. He has increased PVC's postop.  Plan/Discussion:    He looks good overall.  Need to start diuresis. Inotrope wean per AHF team. Will DC MT's today since not much drainage and it is thin serous. Keep left pleural and pocket drain. INR bumped slightly to 1.5 with Coumadin 2.5 last night. Will repeat that dose today. IS, OOB to chair.  I reviewed the LVAD parameters from today, and compared the results to the patient's prior recorded data.  No programming changes were made.  The LVAD is functioning within specified parameters.    Length of Stay: 8  Dorise MARLA Fellers 01/13/2024, 9:33 AM

## 2024-01-13 NOTE — Progress Notes (Signed)
 Drive Line:  Existing VAD dressing removed and site care performed using sterile technique. Drive line exit site cleaned with Chlora prep applicators x 2, allowed to dry, and gauze dressing with Silverlon patch applied. Exit site healing and unincorporated, the velour is fully implanted at site. 1 suture around red rubber intact. Minimal sanguinous drainage noted on previous dressing. No redness, tenderness, foul odor, or rash noted. Drive line anchor reapplied. Continue daily dressing changes by VAD coordinator or nurse champion only. Next dressing change due 01/14/2024.    Lauraine Ahle RN

## 2024-01-14 ENCOUNTER — Inpatient Hospital Stay (HOSPITAL_COMMUNITY)

## 2024-01-14 DIAGNOSIS — R570 Cardiogenic shock: Secondary | ICD-10-CM

## 2024-01-14 DIAGNOSIS — I428 Other cardiomyopathies: Secondary | ICD-10-CM | POA: Diagnosis not present

## 2024-01-14 DIAGNOSIS — E871 Hypo-osmolality and hyponatremia: Secondary | ICD-10-CM | POA: Diagnosis not present

## 2024-01-14 DIAGNOSIS — Z95811 Presence of heart assist device: Secondary | ICD-10-CM | POA: Diagnosis not present

## 2024-01-14 DIAGNOSIS — I5022 Chronic systolic (congestive) heart failure: Secondary | ICD-10-CM | POA: Diagnosis not present

## 2024-01-14 DIAGNOSIS — I340 Nonrheumatic mitral (valve) insufficiency: Secondary | ICD-10-CM | POA: Diagnosis not present

## 2024-01-14 DIAGNOSIS — J9601 Acute respiratory failure with hypoxia: Secondary | ICD-10-CM | POA: Diagnosis not present

## 2024-01-14 DIAGNOSIS — I255 Ischemic cardiomyopathy: Secondary | ICD-10-CM | POA: Diagnosis not present

## 2024-01-14 DIAGNOSIS — I2510 Atherosclerotic heart disease of native coronary artery without angina pectoris: Secondary | ICD-10-CM

## 2024-01-14 DIAGNOSIS — I493 Ventricular premature depolarization: Secondary | ICD-10-CM

## 2024-01-14 LAB — CBC WITH DIFFERENTIAL/PLATELET
Basophils Absolute: 0 K/uL (ref 0.0–0.1)
Basophils Relative: 0 %
Eosinophils Absolute: 0.3 K/uL (ref 0.0–0.5)
Eosinophils Relative: 2 %
HCT: 34.5 % — ABNORMAL LOW (ref 39.0–52.0)
Hemoglobin: 11.1 g/dL — ABNORMAL LOW (ref 13.0–17.0)
Lymphocytes Relative: 12 %
Lymphs Abs: 2 K/uL (ref 0.7–4.0)
MCH: 25.8 pg — ABNORMAL LOW (ref 26.0–34.0)
MCHC: 32.2 g/dL (ref 30.0–36.0)
MCV: 80.2 fL (ref 80.0–100.0)
Monocytes Absolute: 2.3 K/uL — ABNORMAL HIGH (ref 0.1–1.0)
Monocytes Relative: 14 %
Neutro Abs: 12 K/uL — ABNORMAL HIGH (ref 1.7–7.7)
Neutrophils Relative %: 72 %
Platelets: 130 K/uL — ABNORMAL LOW (ref 150–400)
RBC: 4.3 MIL/uL (ref 4.22–5.81)
RDW: 21.2 % — ABNORMAL HIGH (ref 11.5–15.5)
WBC: 16.7 K/uL — ABNORMAL HIGH (ref 4.0–10.5)
nRBC: 0 % (ref 0.0–0.2)

## 2024-01-14 LAB — COMPREHENSIVE METABOLIC PANEL WITH GFR
ALT: 16 U/L (ref 0–44)
AST: 37 U/L (ref 15–41)
Albumin: 3 g/dL — ABNORMAL LOW (ref 3.5–5.0)
Alkaline Phosphatase: 54 U/L (ref 38–126)
Anion gap: 11 (ref 5–15)
BUN: 11 mg/dL (ref 6–20)
CO2: 26 mmol/L (ref 22–32)
Calcium: 8.6 mg/dL — ABNORMAL LOW (ref 8.9–10.3)
Chloride: 89 mmol/L — ABNORMAL LOW (ref 98–111)
Creatinine, Ser: 1.07 mg/dL (ref 0.61–1.24)
GFR, Estimated: >60 mL/min (ref >60)
Glucose, Bld: 127 mg/dL — ABNORMAL HIGH (ref 70–99)
Potassium: 3.8 mmol/L (ref 3.5–5.1)
Sodium: 126 mmol/L — ABNORMAL LOW (ref 135–145)
Total Bilirubin: 1.3 mg/dL — ABNORMAL HIGH (ref 0.0–1.2)
Total Protein: 6.2 g/dL — ABNORMAL LOW (ref 6.5–8.1)

## 2024-01-14 LAB — POCT I-STAT 7, (LYTES, BLD GAS, ICA,H+H)
Acid-Base Excess: 1 mmol/L (ref 0.0–2.0)
Bicarbonate: 24.8 mmol/L (ref 20.0–28.0)
Calcium, Ion: 1.13 mmol/L — ABNORMAL LOW (ref 1.15–1.40)
HCT: 36 % — ABNORMAL LOW (ref 39.0–52.0)
Hemoglobin: 12.2 g/dL — ABNORMAL LOW (ref 13.0–17.0)
O2 Saturation: 93 %
Patient temperature: 38
Potassium: 3.7 mmol/L (ref 3.5–5.1)
Sodium: 126 mmol/L — ABNORMAL LOW (ref 135–145)
TCO2: 26 mmol/L (ref 22–32)
pCO2 arterial: 35.7 mmHg (ref 32–48)
pH, Arterial: 7.453 — ABNORMAL HIGH (ref 7.35–7.45)
pO2, Arterial: 67 mmHg — ABNORMAL LOW (ref 83–108)

## 2024-01-14 LAB — LACTATE DEHYDROGENASE: LDH: 293 U/L — ABNORMAL HIGH (ref 98–192)

## 2024-01-14 LAB — GLUCOSE, CAPILLARY
Glucose-Capillary: 137 mg/dL — ABNORMAL HIGH (ref 70–99)
Glucose-Capillary: 142 mg/dL — ABNORMAL HIGH (ref 70–99)
Glucose-Capillary: 132 mg/dL — ABNORMAL HIGH (ref 70–99)
Glucose-Capillary: 123 mg/dL — ABNORMAL HIGH (ref 70–99)
Glucose-Capillary: 132 mg/dL — ABNORMAL HIGH (ref 70–99)

## 2024-01-14 LAB — PHOSPHORUS: Phosphorus: 3.4 mg/dL (ref 2.5–4.6)

## 2024-01-14 LAB — PROTIME-INR
INR: 1.4 — ABNORMAL HIGH (ref 0.8–1.2)
Prothrombin Time: 17.9 s — ABNORMAL HIGH (ref 11.4–15.2)

## 2024-01-14 LAB — COOXEMETRY PANEL
Carboxyhemoglobin: 3.5 % — ABNORMAL HIGH (ref 0.5–1.5)
Methemoglobin: <0.7 % (ref 0.0–1.5)
O2 Saturation: 63.1 %
Total hemoglobin: 11.4 g/dL — ABNORMAL LOW (ref 12.0–16.0)

## 2024-01-14 LAB — MAGNESIUM: Magnesium: 1.5 mg/dL — ABNORMAL LOW (ref 1.7–2.4)

## 2024-01-14 MED ORDER — MAGNESIUM SULFATE 4 GM/100ML IV SOLN
4.0000 g | Freq: Once | INTRAVENOUS | Status: AC
  Administered 2024-01-14: 4 g via INTRAVENOUS
  Filled 2024-01-14: qty 100

## 2024-01-14 MED ORDER — POTASSIUM CHLORIDE CRYS ER 20 MEQ PO TBCR
20.0000 meq | EXTENDED_RELEASE_TABLET | ORAL | Status: AC
  Administered 2024-01-14 (×3): 20 meq via ORAL
  Filled 2024-01-14 (×3): qty 1

## 2024-01-14 MED ORDER — WARFARIN SODIUM 5 MG PO TABS
5.0000 mg | ORAL_TABLET | Freq: Once | ORAL | Status: AC
  Administered 2024-01-14: 5 mg via ORAL
  Filled 2024-01-14: qty 1

## 2024-01-14 NOTE — Progress Notes (Signed)
 HeartMate 3 Rounding Note  Subjective:   Pain under fairly good control. Ambulated down short hall this am.  Hemodynamics stable on milrinone  0.375 and NO 15 ppm Filling pressures low. SVO2 68, CI 2.6, Co-ox 63  PO2 67 with sats 93% on ABG this am on 4L [place].  -1863 cc/24 hrs on lasix  drip 10. WT up 1 lb. Still 16 lbs over preop wt.   LVAD INTERROGATION:  HeartMate IIl LVAD:  Flow 4.9 liters/min, speed 5500, power 4, PI 3.   Objective:    Vital Signs:   Temp:  [99.1 F (37.3 C)-100.4 F (38 C)] 99.5 F (37.5 C) (11/02 1000) Pulse Rate:  [27-88] 44 (11/02 1000) Resp:  [17-41] 25 (11/02 1000) BP: (85-133)/(49-101) 107/80 (11/02 1000) SpO2:  [91 %-98 %] 94 % (11/02 1000) Arterial Line BP: (86-120)/(72-117) 110/75 (11/02 1000) FiO2 (%):  [36 %] 36 % (11/01 1445) Weight:  [152 kg] 152 kg (11/02 0500) Last BM Date : 01/10/24 Mean arterial Pressure 72-86  Intake/Output:   Intake/Output Summary (Last 24 hours) at 01/14/2024 1030 Last data filed at 01/14/2024 1000 Gross per 24 hour  Intake 1792.89 ml  Output 4420 ml  Net -2627.11 ml     Physical Exam: General:  Well appearing. No resp difficulty but not breathing deep. HEENT: normal Neck: normal Cor: Distant heart sounds with LVAD hum present. Lungs: diminished in bases Abdomen: soft, nontender, nondistended. Good bowel sounds. Extremities: moderate edema Neuro: alert & orientedx3, cranial nerves grossly intact. moves all 4 extremities w/o difficulty. Affect pleasant  Telemetry: sinus with PVC's  Labs: Basic Metabolic Panel: Recent Labs  Lab 01/11/24 2049 01/12/24 0500 01/12/24 0558 01/12/24 1613 01/12/24 1614 01/13/24 0357 01/13/24 0407 01/13/24 1655 01/14/24 0502 01/14/24 0641  NA 131* 131*   < > 131*   < > 127* 129* 126* 126* 126*  K 4.6 5.0   < > 4.4   < > 4.7 4.8 4.5 3.8 3.7  CL 98 98  --  98  --  95*  --  93* 89*  --   CO2 22 23  --  24  --  23  --  21* 26  --   GLUCOSE 177* 121*  --  137*  --  126*   --  124* 127*  --   BUN 17 15  --  13  --  11  --  11 11  --   CREATININE 1.42* 1.35*  --  1.36*  --  1.23  --  1.03 1.07  --   CALCIUM 8.0* 8.3*  --  8.4*  --  8.4*  --  8.0* 8.6*  --   MG 2.5* 2.4  --  2.2  --  1.9  --   --  1.5*  --   PHOS  --  4.0  --   --   --  3.5  --   --  3.4  --    < > = values in this interval not displayed.    Liver Function Tests: Recent Labs  Lab 01/12/24 0500 01/13/24 0357 01/14/24 0502  AST 68* 60* 37  ALT 25 21 16   ALKPHOS 44 47 54  BILITOT 1.5* 1.8* 1.3*  PROT 5.5* 5.6* 6.2*  ALBUMIN 3.3* 3.0* 3.0*   No results for input(s): LIPASE, AMYLASE in the last 168 hours. No results for input(s): AMMONIA in the last 168 hours.  CBC: Recent Labs  Lab 01/11/24 2049 01/12/24 0500 01/12/24 0558 01/12/24 1613 01/12/24 1614  01/13/24 0357 01/13/24 0407 01/14/24 0502 01/14/24 0641  WBC 13.1* 14.4*  --  15.3*  --  16.9*  --  16.7*  --   NEUTROABS  --  10.9*  --   --   --  12.2*  --  12.0*  --   HGB 10.6* 10.9*   < > 10.4* 10.9* 10.7* 11.6* 11.1* 12.2*  HCT 32.9* 33.0*   < > 32.6* 32.0* 33.8* 34.0* 34.5* 36.0*  MCV 81.0 79.3*  --  80.5  --  81.3  --  80.2  --   PLT 128* 123*  --  110*  --  128*  --  130*  --    < > = values in this interval not displayed.    INR: Recent Labs  Lab 01/11/24 1242 01/11/24 1456 01/12/24 0500 01/13/24 0357 01/14/24 0502  INR 1.3* 1.2  1.2 1.2 1.5* 1.4*    Other results: EKG:   Imaging: DG CHEST PORT 1 VIEW Result Date: 01/14/2024 CLINICAL DATA:  Left ventricular assist device. Status post extubation. EXAM: PORTABLE CHEST 1 VIEW COMPARISON:  01/12/2024 FINDINGS: Endotracheal tube and nasogastric tube have been removed. Swan-Ganz catheter and left chest tube remain in place. No pneumothorax visualized. Stable cardiomegaly. Mild interstitial edema is also unchanged. Decreased lung volumes and increased bibasilar atelectasis seen. IMPRESSION: Decreased lung volumes with increased bibasilar atelectasis. Stable  cardiomegaly and diffuse mild interstitial edema. Electronically Signed   By: Norleen DELENA Kil M.D.   On: 01/14/2024 07:52     Medications:     Scheduled Medications:  acetaminophen   1,000 mg Oral Q6H   Or   acetaminophen  (TYLENOL ) oral liquid 160 mg/5 mL  1,000 mg Per Tube Q6H   bisacodyl  10 mg Oral Daily   Or   bisacodyl  10 mg Rectal Daily   Chlorhexidine  Gluconate Cloth  6 each Topical Daily   docusate sodium  200 mg Oral Daily   ezetimibe   10 mg Oral Daily   gabapentin  300 mg Oral BID   insulin  aspart  0-24 Units Subcutaneous TID WC   insulin  glargine-yfgn  18 Units Subcutaneous Daily   mexiletine  150 mg Oral Q8H   pantoprazole  40 mg Oral Daily   potassium chloride   20 mEq Oral Q4H   sodium chloride  flush  3 mL Intravenous Q12H   warfarin  5 mg Oral ONCE-1600   Warfarin - Physician Dosing Inpatient   Does not apply q1600    Infusions:  amiodarone  60 mg/hr (01/14/24 1000)   furosemide  (LASIX ) 200 mg in dextrose  5 % 100 mL (2 mg/mL) infusion 10 mg/hr (01/14/24 1000)   magnesium sulfate bolus IVPB     milrinone  0.375 mcg/kg/min (01/14/24 1003)    PRN Medications: dextrose , morphine  injection, ondansetron  (ZOFRAN ) IV, mouth rinse, oxyCODONE , sodium chloride  flush, traMADol   Assessment:   POD 3 HM 3 LVAD for 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 Low normal RV systolic function on echo with moderate MR.   Severe multivessel CAD.   PVC's recorded at 25.3 % on Zio in May 2025. He had been on Mexiletine due to not tolerating amio very well. He is now on both postop. He has increased PVC's postop.  Plan/Discussion:    Doing well overall. I think milrinone  can be weaned per AHF team. Should be able to DC NO Would be nice to remove swan to aid mobilization.   Chest tube output low. Will keep in today with LLL atelectasis  to avoid pleural effusion.  INR stalled at 1.4. will give 5 mg Coumadin today.  Continue diuresis  and KCL since still 16 lbs over preop.  IS, OOB, ambulation.   I reviewed the LVAD parameters from today, and compared the results to the patient's prior recorded data.  No programming changes were made.  The LVAD is functioning within specified parameters.  LVAD interrogation was negative for any significant power changes, alarms or PI events/speed drops.    Length of Stay: [address]  Dorise POUR Jadia Capers 01/14/2024, 10:30 AM

## 2024-01-14 NOTE — Progress Notes (Signed)
 Patient ID: Philip Monroe, male   DOB: 1975/09/06, 48 y.o.   MRN: [id number]   Advanced Heart Failure Rounding Note  Chief Complaint: s/p HM3 Implant Subjective:    10/30 S/P HMIII  Milrinone  0.375 + iNO +  Amio 60 mg/hr.  Good diuresis yesterday with Lasix  gtt 10 mg/hr + metolazone  5 mg po x 1.  Creatinine stable 1.07.  I/Os net negative 1864.   [place]wan: RA 8 PA 41/23 CI 2.4 Co-ox 63%  LVAD Interrogation HM III: Speed: 5500 Flow: 4.8 PI: 3.1 Power: 4.1.   Awake/alert, has walked today.   Objective:    Vital Signs:   Temp:  [99.1 F (37.3 C)-100.4 F (38 C)] 99.3 F (37.4 C) (11/02 1200) Pulse Rate:  [27-88] 33 (11/02 1100) Resp:  [17-41] 17 (11/02 1200) BP: (85-133)/(49-101) 86/75 (11/02 1200) SpO2:  [91 %-98 %] 95 % (11/02 1200) Arterial Line BP: (86-120)/(72-117) 105/73 (11/02 1100) FiO2 (%):  [36 %] 36 % (11/01 1445) Weight:  [152 kg] 152 kg (11/02 0500) Last BM Date : 01/10/24  Weight change: Filed Weights   01/12/24 0500 01/13/24 0600 01/14/24 0500  Weight: (!) 158.4 kg (!) 151.6 kg (!) 152 kg   Intake/Output:  Intake/Output Summary (Last 24 hours) at 01/14/2024 1230 Last data filed at 01/14/2024 1200 Gross per 24 hour  Intake 1754.64 ml  Output 5215 ml  Net -3460.36 ml    Physical Exam  Physical Exam: General: Well appearing this am. NAD.  HEENT: Normal. Neck: Supple, JVP 8 cm. Carotids OK.  Cardiac:  Mechanical heart sounds with LVAD hum present.  Lungs:  CTAB, normal effort.  Abdomen:  NT, ND, no HSM. No bruits or masses. +BS  LVAD exit site: Well-healed and incorporated. Dressing dry and intact. No erythema or drainage. Stabilization device present and accurately applied. Driveline dressing changed daily per sterile technique. Extremities:  Warm and dry. No cyanosis, clubbing, rash, or edema.  Neuro:  Alert & oriented x 3. Cranial nerves grossly intact. Moves all 4 extremities w/o difficulty. Affect pleasant    Telemetry: NSR with rare PVCs  (personally reviewed) Labs: Basic Metabolic Panel: Recent Labs  Lab 01/11/24 2049 01/12/24 0500 01/12/24 0558 01/12/24 1613 01/12/24 1614 01/13/24 0357 01/13/24 0407 01/13/24 1655 01/14/24 0502 01/14/24 0641  NA 131* 131*   < > 131*   < > 127* 129* 126* 126* 126*  K 4.6 5.0   < > 4.4   < > 4.7 4.8 4.5 3.8 3.7  CL 98 98  --  98  --  95*  --  93* 89*  --   CO2 22 23  --  24  --  23  --  21* 26  --   GLUCOSE 177* 121*  --  137*  --  126*  --  124* 127*  --   BUN 17 15  --  13  --  11  --  11 11  --   CREATININE 1.42* 1.35*  --  1.36*  --  1.23  --  1.03 1.07  --   CALCIUM 8.0* 8.3*  --  8.4*  --  8.4*  --  8.0* 8.6*  --   MG 2.5* 2.4  --  2.2  --  1.9  --   --  1.5*  --   PHOS  --  4.0  --   --   --  3.5  --   --  3.4  --    < > = values  in this interval not displayed.   Liver Function Tests: Recent Labs  Lab 01/12/24 0500 01/13/24 0357 01/14/24 0502  AST 68* 60* 37  ALT 25 21 16   ALKPHOS 44 47 54  BILITOT 1.5* 1.8* 1.3*  PROT 5.5* 5.6* 6.2*  ALBUMIN 3.3* 3.0* 3.0*   No results for input(s): LIPASE, AMYLASE in the last 168 hours. No results for input(s): AMMONIA in the last 168 hours.  CBC: Recent Labs  Lab 01/11/24 2049 01/12/24 0500 01/12/24 0558 01/12/24 1613 01/12/24 1614 01/13/24 0357 01/13/24 0407 01/14/24 0502 01/14/24 0641  WBC 13.1* 14.4*  --  15.3*  --  16.9*  --  16.7*  --   NEUTROABS  --  10.9*  --   --   --  12.2*  --  12.0*  --   HGB 10.6* 10.9*   < > 10.4* 10.9* 10.7* 11.6* 11.1* 12.2*  HCT 32.9* 33.0*   < > 32.6* 32.0* 33.8* 34.0* 34.5* 36.0*  MCV 81.0 79.3*  --  80.5  --  81.3  --  80.2  --   PLT 128* 123*  --  110*  --  128*  --  130*  --    < > = values in this interval not displayed.   BNP (last 3 results) Recent Labs    12/25/23 1139 01/05/24 1907 01/12/24 0500  BNP 315.6* 674.8* 623.8*   Medications:    Scheduled Medications:  acetaminophen   1,000 mg Oral Q6H   Or   acetaminophen  (TYLENOL ) oral liquid 160 mg/5 mL   1,000 mg Per Tube Q6H   bisacodyl  10 mg Oral Daily   Or   bisacodyl  10 mg Rectal Daily   Chlorhexidine  Gluconate Cloth  6 each Topical Daily   docusate sodium  200 mg Oral Daily   ezetimibe   10 mg Oral Daily   gabapentin  300 mg Oral BID   insulin  aspart  0-24 Units Subcutaneous TID WC   insulin  glargine-yfgn  18 Units Subcutaneous Daily   mexiletine  150 mg Oral Q8H   pantoprazole  40 mg Oral Daily   potassium chloride   20 mEq Oral Q4H   sodium chloride  flush  3 mL Intravenous Q12H   warfarin  5 mg Oral ONCE-1600   Warfarin - Physician Dosing Inpatient   Does not apply q1600    Infusions:  amiodarone  60 mg/hr (01/14/24 1200)   furosemide  (LASIX ) 200 mg in dextrose  5 % 100 mL (2 mg/mL) infusion 10 mg/hr (01/14/24 1200)   magnesium sulfate bolus IVPB 50 mL/hr at 01/14/24 1200   milrinone  0.375 mcg/kg/min (01/14/24 1200)    PRN Medications: dextrose , morphine  injection, ondansetron  (ZOFRAN ) IV, mouth rinse, oxyCODONE , sodium chloride  flush, traMADol   Plan/Discussion:    1. Chronic systolic heart failure: - combined ischemic and nonischemic: -Mentions of compaction CM from [facility] cards notes. PVCs may also contribute, 25.3% PVCs with NSVT on Zio monitor in 5/25 - RHC 10/25 with low filling pressures and reduced CI (1.9 TD, 2.2 Fick). - NYHA class IV symptoms on admission.  - S/P HM3 LVAD Implant - intra-op TEE with normal RV function (per anesthesia report). Received 4FFP, 650 CS intra-op - Remains on  milrinone  0.375, decrease to 0.25 with CI 2.4/co-ox 63%.  - Good diuresis yesterday with CVP down to 8 today.  Would continue Lasix  10 mg/hr today, good UOP.  Can hold off on metolazone .  - Continue wafarin, INR 1.4 - Can start to wean iNO.  2 CAD:   - LHC 5/24 at [facility]: severe multivessel CAD including 80% mid RCA, 100% RPDA, 100% mid LCx, 80% OM1, 80% mid-distal LAD.  - LHC 9/25: occluded PDA and mid AV LCx, 90% ramus, severe diffuse disease in right PLV, nonobstructive LAD  disease. With markedly low EF and without severe LAD disease, CABG nor a durable option.  - No ASA given anticoagulation use and stable CAD.  - Continue Zetia .   3. PVCs:  - Zio 5/25 with 25.3% (prev did not tolerate amiodarone  well, on mexiletine) - increased burden post-op, now improving - Can decrease amiodarone  to 30 mg/hr.  - Continue mexilitine 150 mg every 8 hours.     Length of Stay: 9 CRITICAL CARE Performed by: Ezra Shuck   Total critical care time: 40 minutes  Critical care time was exclusive of separately billable procedures and treating other patients.  Critical care was necessary to treat or prevent imminent or life-threatening deterioration.  Critical care was time spent personally by me on the following activities: development of treatment plan with patient and/or surrogate as well as nursing, discussions with consultants, evaluation of patient's response to treatment, examination of patient, obtaining history from patient or surrogate, ordering and performing treatments and interventions, ordering and review of laboratory studies, ordering and review of radiographic studies, pulse oximetry and re-evaluation of patient's condition.

## 2024-01-14 NOTE — Progress Notes (Signed)
 TCTS Evening Rounds:  Hemodynamically stable on decreased milrinone  to 0.25 and NO 10  CI 2.9, SVO2 70.  Diuresing well on lasix  drip  -3100 so far today.   CVP 10.  LVAD parameters stable.

## 2024-01-14 NOTE — Progress Notes (Signed)
 Drive Line:  Existing VAD dressing removed and site care performed using sterile technique. Drive line exit site cleaned with Chlora prep applicators x 2, allowed to dry, and gauze dressing with Silverlon patch applied. Exit site healing and unincorporated, the velour is fully implanted at site. 1 suture around red rubber intact. No drainage on previous dressing. No redness, tenderness, foul odor, or rash noted. Drive line anchor reapplied. Continue daily dressing changes by VAD coordinator or nurse champion only. Next dressing change due 01/15/24.

## 2024-01-14 NOTE — Progress Notes (Addendum)
 NAME:  Philip Monroe, MRN:  [id number], DOB:  1975/07/26, LOS: 9 ADMISSION DATE:  01/05/2024, CONSULTATION DATE: 10/30  REFERRING MD:  MD Su CHIEF COMPLAINT:  LVAD  History of Present Illness:  Patient is a 48 year old male with significant past medical history of chronic HFrEF < 20%, CAD multivessel (recent [facility] 5/24), followed in past by [facility] Cards for iCM/noncompaction CM, pulmonary HTN, HTN, CKD stage III, diabetes type 2, HLD, hx of ETOH use, multiple recent admission of CHF exacerbations- most recent in 9/25 who was admitted on 01/05/24 for HF optimization, and also teeth extraction x 4 (occurred on 10/27) for LVAD surgery on 01/11/24 by MD Su with TCTS. PCCM consulted to assist with post-op vent management as well as critical care needs during ICU stay.   Estimated blood loss 800 mL  4 of FFP given Protamine given for heparin  reversal DDAVP  Pertinent  Medical History   Past Medical History:  Diagnosis Date   Aortic root dilatation    a. 07/2022 Echo: Ao root 4.4cm, Asc Ao 4.0cm.   Asthma    as a child, no problems as an adult, rarely uses inhaler   CAD (coronary artery disease)    a. 08/2017 Cath: LM nl, LAD 42m/d, D1 70ost, LCX 110m, OM1 90 inf branch, 60 sup branch, OM2 small, RCA 30 diffuse, RPDA 100 w/ L->R collats; b. 07/2022 Echo: LM nl, LAD 1m, D1 80, LCX 155m, OM1 80, RCA 13m, RPDA 100-->No targets for PCI/CABG-->Med rx.   CHF (congestive heart failure) (HCC)    takes lasix    Chronic HFrEF (heart failure with reduced ejection fraction) (HCC)    a. 03/2015 Echo: EF 35-40%; b. 08/2017 Echo: EF 15%; c. 08/2017 cMRI: EF 35%; d. 06/2018 Echo: EF 10-15%; e. 07/2022 Echo: EF 15-20%, grIII DD, mild MR, sev dil LA, mod-sev PH, Ao root 4.4cm, Asc Ao 4.0cm; f. 07/2022 RHC: RA 4, RV 38/3, PA 43/18 (26), PCWP 11. CO/CI 5.3/1.8.   CKD (chronic kidney disease), stage II    Diabetes mellitus without complication (HCC)    type 2   Dyspnea    with exertion   Gout    Hyperlipidemia LDL goal  <70    Hypertension    Ischemic cardiomyopathy    a. 03/2015 Echo: EF 35-40%; b. 08/2017 Echo: EF 15%; c. 08/2017 cMRI: EF 35%; d. 06/2018 Echo: EF 10-15%; e. 07/2022 Echo: EF 15-20%, grIII DD.   IVCD (intraventricular conduction defect)    Left ventricular noncompaction [facility])    Morbid obesity (HCC)    NSVT (nonsustained ventricular tachycardia) (HCC)    a. 09/2022 Event monitor: predominantly sinus rhythm, avg HR 81 (46-167). 17 runs NSVT (fastest/longest 13 beats x 167). Rare PACs, 17.5% PVC burden.   PVC's (premature ventricular contractions)    a. 09/2022 Event Monitor: 17.5% PVC burden.   Statin intolerance    Tobacco abuse      Significant Hospital Events: Including procedures, antibiotic start and stop dates in addition to other pertinent events   10/24 Admit for HF optimization for LVAD surgery on 10/30 10/27 Teeth Extraction x 4 by oral surgery  10/30 HM3 LVAD placement, PCCM consult for post-op management   Interim History / Subjective:  Patient is sitting in chair.  No new issues.  2.5 L of urine output overnight overall still 8 L positive since admission.  Milrinone  is decreased to 0.25 mcg/kg/min.  Lasix  discontinued.  Amiodarone  dose is decreased to 30 mg/h.  Coox 63.1 LDH 293  milrinone  0.25  Amio 30  Objective    Blood pressure 113/79, pulse 72, temperature 99.3 F (37.4 C), resp. rate 18, height 6' 3 (1.905 m), weight (!) 152 kg, SpO2 94%. PAP: (23-60)/(0-35) 50/28 CVP:  [1 mmHg-17 mmHg] 12 mmHg CO:  [5.1 L/min-10.5 L/min] 6.6 L/min CI:  [1.9 L/min/m2-3.9 L/min/m2] 2.5 L/min/m2      Intake/Output Summary (Last 24 hours) at 01/14/2024 1630 Last data filed at 01/14/2024 1500 Gross per 24 hour  Intake 1693.18 ml  Output 5855 ml  Net -4161.82 ml   Filed Weights   01/12/24 0500 01/13/24 0600 01/14/24 0500  Weight: (!) 158.4 kg (!) 151.6 kg (!) 152 kg    Examination: General: Alert and oriented no acute distress. HENT: Atraumatic normocephalic both pupils equal  reactive to light. Lungs: Lungs bilaterally.  On 4 L oxygen. Cardiovascular: a paced.  S1 S2 heard no S3.  LVAD hum  Abdomen: Soft nontender Extremities: no cyanosis or clubbing pedal edema present Neuro: Awake oriented x 3 no focal neurological deficit.  GU: foley  Resolved problem list   Assessment and Plan   Acute hypoxic respiratory failure postoperatively -improving P - Extubated 10/31 - On nitric oxide - Currently on 4 L nasal cannula.  Chronic systolic HF s/p LVAD HM III  mvCAD Mild pHTN PVCs with prior poor amio tolerance  Hx HTN  Hx HLD  Hypervolemic hyponatremia stable sodiums around 126. P -post op, surgical care per CVTS -complete ppx abx  -adv HF following, recs appreciated -starting Lasix  drip today stopped epinephrine. -follow coox ldh -milrinone , continue -iNO per HF.  -amio continue - On mexitil   -Blood sugars better controlled on basal bolus insulin  regimen. -coumadin started on 10/31 night  -statin, no ASA w AC above  -lasix  gtt.  CKD II -follow renal indices UOP   Expected ABLA, thrombocytopenia  P -follow CBC   Morbid obesity -no acute intervention   S/p Teeth Extraction 10/27  -Per oral surgery  Gout  -resume home meds when appropriate    Labs   CBC: Recent Labs  Lab 01/11/24 2049 01/12/24 0500 01/12/24 0558 01/12/24 1613 01/12/24 1614 01/13/24 0357 01/13/24 0407 01/14/24 0502 01/14/24 0641  WBC 13.1* 14.4*  --  15.3*  --  16.9*  --  16.7*  --   NEUTROABS  --  10.9*  --   --   --  12.2*  --  12.0*  --   HGB 10.6* 10.9*   < > 10.4* 10.9* 10.7* 11.6* 11.1* 12.2*  HCT 32.9* 33.0*   < > 32.6* 32.0* 33.8* 34.0* 34.5* 36.0*  MCV 81.0 79.3*  --  80.5  --  81.3  --  80.2  --   PLT 128* 123*  --  110*  --  128*  --  130*  --    < > = values in this interval not displayed.    Basic Metabolic Panel: Recent Labs  Lab 01/11/24 2049 01/12/24 0500 01/12/24 0558 01/12/24 1613 01/12/24 1614 01/13/24 0357 01/13/24 0407  01/13/24 1655 01/14/24 0502 01/14/24 0641  NA 131* 131*   < > 131*   < > 127* 129* 126* 126* 126*  K 4.6 5.0   < > 4.4   < > 4.7 4.8 4.5 3.8 3.7  CL 98 98  --  98  --  95*  --  93* 89*  --   CO2 22 23  --  24  --  23  --  21* 26  --  GLUCOSE 177* 121*  --  137*  --  126*  --  124* 127*  --   BUN 17 15  --  13  --  11  --  11 11  --   CREATININE 1.42* 1.35*  --  1.36*  --  1.23  --  1.03 1.07  --   CALCIUM 8.0* 8.3*  --  8.4*  --  8.4*  --  8.0* 8.6*  --   MG 2.5* 2.4  --  2.2  --  1.9  --   --  1.5*  --   PHOS  --  4.0  --   --   --  3.5  --   --  3.4  --    < > = values in this interval not displayed.   GFR: Estimated Creatinine Clearance: 134.6 mL/min (by C-G formula based on SCr of 1.07 mg/dL). Recent Labs  Lab 01/12/24 0500 01/12/24 1613 01/13/24 0357 01/14/24 0502  WBC 14.4* 15.3* 16.9* 16.7*    Liver Function Tests: Recent Labs  Lab 01/12/24 0500 01/13/24 0357 01/14/24 0502  AST 68* 60* 37  ALT 25 21 16   ALKPHOS 44 47 54  BILITOT 1.5* 1.8* 1.3*  PROT 5.5* 5.6* 6.2*  ALBUMIN 3.3* 3.0* 3.0*   No results for input(s): LIPASE, AMYLASE in the last 168 hours. No results for input(s): AMMONIA in the last 168 hours.  ABG    Component Value Date/Time   PHART 7.453 (H) 01/14/2024 0641   PCO2ART 35.7 01/14/2024 0641   PO2ART 67 (L) 01/14/2024 0641   HCO3 24.8 01/14/2024 0641   TCO2 26 01/14/2024 0641   ACIDBASEDEF 2.0 01/13/2024 0407   O2SAT 93 01/14/2024 0641     Coagulation Profile: Recent Labs  Lab 01/11/24 1242 01/11/24 1456 01/12/24 0500 01/13/24 0357 01/14/24 0502  INR 1.3* 1.2  1.2 1.2 1.5* 1.4*    Cardiac Enzymes: No results for input(s): CKTOTAL, CKMB, CKMBINDEX, TROPONINI in the last 168 hours.  HbA1C: Hgb A1c MFr Bld  Date/Time Value Ref Range Status  11/29/2023 04:45 AM 6.6 (H) 4.8 - 5.6 % Final    Comment:    (NOTE) Diagnosis of Diabetes The following HbA1c ranges recommended by the American Diabetes Association  (ADA) may be used as an aid in the diagnosis of diabetes mellitus.  Hemoglobin             Suggested A1C NGSP%              Diagnosis  <5.7                   Non Diabetic  5.7-6.4                Pre-Diabetic  >6.4                   Diabetic  <7.0                   Glycemic control for                       adults with diabetes.      CBG: Recent Labs  Lab 01/13/24 1648 01/13/24 2123 01/14/24 0641 01/14/24 0853 01/14/24 1136  GLUCAP 132* 141* 137* 142* 132*   CRITICAL CARE Performed by: Tamela Stakes   Total critical care time:  35 minutes  Tamela Stakes, MD  Attending Physician, Critical Care Medicine [facility] See Amion for  pager If no response to pager, please call [phone] until 7pm After 7pm, Please call E-link [phone]

## 2024-01-14 NOTE — Plan of Care (Signed)

## 2024-01-15 ENCOUNTER — Other Ambulatory Visit: Payer: Self-pay

## 2024-01-15 ENCOUNTER — Inpatient Hospital Stay (HOSPITAL_COMMUNITY)

## 2024-01-15 DIAGNOSIS — G8918 Other acute postprocedural pain: Secondary | ICD-10-CM | POA: Diagnosis not present

## 2024-01-15 DIAGNOSIS — E876 Hypokalemia: Secondary | ICD-10-CM

## 2024-01-15 DIAGNOSIS — Z95811 Presence of heart assist device: Secondary | ICD-10-CM | POA: Diagnosis not present

## 2024-01-15 DIAGNOSIS — R739 Hyperglycemia, unspecified: Secondary | ICD-10-CM

## 2024-01-15 DIAGNOSIS — I5023 Acute on chronic systolic (congestive) heart failure: Secondary | ICD-10-CM | POA: Diagnosis not present

## 2024-01-15 LAB — CBC WITH DIFFERENTIAL/PLATELET
Basophils Absolute: 0 K/uL (ref 0.0–0.1)
Basophils Relative: 0 %
Eosinophils Absolute: 0.4 K/uL (ref 0.0–0.5)
Eosinophils Relative: 3 %
HCT: 33.7 % — ABNORMAL LOW (ref 39.0–52.0)
Hemoglobin: 11.2 g/dL — ABNORMAL LOW (ref 13.0–17.0)
Lymphocytes Relative: 13 %
Lymphs Abs: 1.8 K/uL (ref 0.7–4.0)
MCH: 25.9 pg — ABNORMAL LOW (ref 26.0–34.0)
MCHC: 33.2 g/dL (ref 30.0–36.0)
MCV: 78 fL — ABNORMAL LOW (ref 80.0–100.0)
Monocytes Absolute: 2.4 K/uL — ABNORMAL HIGH (ref 0.1–1.0)
Monocytes Relative: 17 %
Neutro Abs: 9.4 K/uL — ABNORMAL HIGH (ref 1.7–7.7)
Neutrophils Relative %: 67 %
Platelets: 161 K/uL (ref 150–400)
RBC: 4.32 MIL/uL (ref 4.22–5.81)
RDW: 21.1 % — ABNORMAL HIGH (ref 11.5–15.5)
WBC: 14 K/uL — ABNORMAL HIGH (ref 4.0–10.5)
nRBC: 0 % (ref 0.0–0.2)

## 2024-01-15 LAB — POCT I-STAT 7, (LYTES, BLD GAS, ICA,H+H)
Acid-Base Excess: 10 mmol/L — ABNORMAL HIGH (ref 0.0–2.0)
Bicarbonate: 33.6 mmol/L — ABNORMAL HIGH (ref 20.0–28.0)
Calcium, Ion: 1.08 mmol/L — ABNORMAL LOW (ref 1.15–1.40)
HCT: 37 % — ABNORMAL LOW (ref 39.0–52.0)
Hemoglobin: 12.6 g/dL — ABNORMAL LOW (ref 13.0–17.0)
O2 Saturation: 96 %
Patient temperature: 37.5
Potassium: 3.2 mmol/L — ABNORMAL LOW (ref 3.5–5.1)
Sodium: 126 mmol/L — ABNORMAL LOW (ref 135–145)
TCO2: 35 mmol/L — ABNORMAL HIGH (ref 22–32)
pCO2 arterial: 39.7 mmHg (ref 32–48)
pH, Arterial: 7.536 — ABNORMAL HIGH (ref 7.35–7.45)
pO2, Arterial: 76 mmHg — ABNORMAL LOW (ref 83–108)

## 2024-01-15 LAB — PROTIME-INR
INR: 1.4 — ABNORMAL HIGH (ref 0.8–1.2)
Prothrombin Time: 17.8 s — ABNORMAL HIGH (ref 11.4–15.2)

## 2024-01-15 LAB — BASIC METABOLIC PANEL WITH GFR
Anion gap: 14 (ref 5–15)
BUN: 18 mg/dL (ref 6–20)
CO2: 29 mmol/L (ref 22–32)
Calcium: 8.7 mg/dL — ABNORMAL LOW (ref 8.9–10.3)
Chloride: 84 mmol/L — ABNORMAL LOW (ref 98–111)
Creatinine, Ser: 1.19 mg/dL (ref 0.61–1.24)
GFR, Estimated: >60 mL/min (ref >60)
Glucose, Bld: 114 mg/dL — ABNORMAL HIGH (ref 70–99)
Potassium: 3.2 mmol/L — ABNORMAL LOW (ref 3.5–5.1)
Sodium: 127 mmol/L — ABNORMAL LOW (ref 135–145)

## 2024-01-15 LAB — COOXEMETRY PANEL
Carboxyhemoglobin: 1.9 % — ABNORMAL HIGH (ref 0.5–1.5)
Methemoglobin: <0.7 % (ref 0.0–1.5)
O2 Saturation: 60.9 %
Total hemoglobin: 11.3 g/dL — ABNORMAL LOW (ref 12.0–16.0)

## 2024-01-15 LAB — PHOSPHORUS: Phosphorus: 3.5 mg/dL (ref 2.5–4.6)

## 2024-01-15 LAB — LACTATE DEHYDROGENASE: LDH: 292 U/L — ABNORMAL HIGH (ref 98–192)

## 2024-01-15 LAB — GLUCOSE, CAPILLARY
Glucose-Capillary: 136 mg/dL — ABNORMAL HIGH (ref 70–99)
Glucose-Capillary: 133 mg/dL — ABNORMAL HIGH (ref 70–99)
Glucose-Capillary: 112 mg/dL — ABNORMAL HIGH (ref 70–99)
Glucose-Capillary: 132 mg/dL — ABNORMAL HIGH (ref 70–99)

## 2024-01-15 LAB — MAGNESIUM: Magnesium: 1.7 mg/dL (ref 1.7–2.4)

## 2024-01-15 MED ORDER — ACETAZOLAMIDE 250 MG PO TABS
500.0000 mg | ORAL_TABLET | Freq: Once | ORAL | Status: DC
  Filled 2024-01-15: qty 2

## 2024-01-15 MED ORDER — SODIUM CHLORIDE 0.9% FLUSH: 10.0000 mL | INTRAVENOUS | Status: DC | PRN

## 2024-01-15 MED ORDER — MAGNESIUM SULFATE 4 GM/100ML IV SOLN
4.0000 g | Freq: Once | INTRAVENOUS | Status: AC
  Administered 2024-01-15: 4 g via INTRAVENOUS
  Filled 2024-01-15: qty 100

## 2024-01-15 MED ORDER — OXYCODONE HCL 5 MG PO TABS
10.0000 mg | ORAL_TABLET | ORAL | Status: DC | PRN
  Administered 2024-01-15 – 2024-01-16 (×3): 10 mg via ORAL
  Administered 2024-01-17 (×2): 15 mg via ORAL
  Administered 2024-01-17: 10 mg via ORAL
  Administered 2024-01-17 (×2): 15 mg via ORAL
  Administered 2024-01-18: 10 mg via ORAL
  Administered 2024-01-18: 15 mg via ORAL
  Administered 2024-01-18: 10 mg via ORAL
  Administered 2024-01-19 – 2024-01-24 (×16): 15 mg via ORAL
  Filled 2024-01-15 (×7): qty 3
  Filled 2024-01-15: qty 2
  Filled 2024-01-15 (×5): qty 3
  Filled 2024-01-15 (×2): qty 2
  Filled 2024-01-15: qty 3
  Filled 2024-01-15 (×2): qty 2
  Filled 2024-01-15 (×5): qty 3
  Filled 2024-01-15: qty 2
  Filled 2024-01-15 (×3): qty 3

## 2024-01-15 MED ORDER — SPIRONOLACTONE 25 MG PO TABS
25.0000 mg | ORAL_TABLET | Freq: Every day | ORAL | Status: DC
  Administered 2024-01-15 – 2024-01-16 (×2): 25 mg via ORAL
  Filled 2024-01-15 (×2): qty 1

## 2024-01-15 MED ORDER — SODIUM CHLORIDE 0.9% FLUSH
10.0000 mL | Freq: Two times a day (BID) | INTRAVENOUS | Status: DC
  Administered 2024-01-15 – 2024-01-16 (×2): 10 mL
  Administered 2024-01-17: 30 mL
  Administered 2024-01-17 – 2024-01-21 (×9): 10 mL
  Administered 2024-01-22: 20 mL
  Administered 2024-01-22: 10 mL
  Administered 2024-01-23: 20 mL
  Administered 2024-01-24: 10 mL

## 2024-01-15 MED ORDER — ENOXAPARIN SODIUM 40 MG/0.4ML IJ SOSY
40.0000 mg | PREFILLED_SYRINGE | Freq: Every day | INTRAMUSCULAR | Status: DC
  Administered 2024-01-15 – 2024-01-19 (×5): 40 mg via SUBCUTANEOUS
  Filled 2024-01-15 (×5): qty 0.4

## 2024-01-15 MED ORDER — POTASSIUM CHLORIDE CRYS ER 20 MEQ PO TBCR
40.0000 meq | EXTENDED_RELEASE_TABLET | ORAL | Status: AC
  Administered 2024-01-15 (×2): 40 meq via ORAL
  Filled 2024-01-15 (×2): qty 2

## 2024-01-15 MED ORDER — WARFARIN SODIUM 5 MG PO TABS
5.0000 mg | ORAL_TABLET | Freq: Once | ORAL | Status: AC
  Administered 2024-01-15: 5 mg via ORAL
  Filled 2024-01-15: qty 1

## 2024-01-15 MED ORDER — ACETAZOLAMIDE 250 MG PO TABS
500.0000 mg | ORAL_TABLET | Freq: Two times a day (BID) | ORAL | Status: AC
  Administered 2024-01-15 (×2): 500 mg via ORAL
  Filled 2024-01-15: qty 2

## 2024-01-15 MED ORDER — GABAPENTIN 400 MG PO CAPS
400.0000 mg | ORAL_CAPSULE | Freq: Two times a day (BID) | ORAL | Status: DC
  Administered 2024-01-15 – 2024-01-16 (×2): 400 mg via ORAL
  Filled 2024-01-15 (×2): qty 1

## 2024-01-15 MED ORDER — POTASSIUM CHLORIDE CRYS ER 20 MEQ PO TBCR
20.0000 meq | EXTENDED_RELEASE_TABLET | ORAL | Status: DC
  Administered 2024-01-15: 20 meq via ORAL
  Filled 2024-01-15 (×2): qty 1

## 2024-01-15 MED ORDER — ENSURE PLUS HIGH PROTEIN PO LIQD
237.0000 mL | Freq: Three times a day (TID) | ORAL | Status: DC
  Administered 2024-01-16 – 2024-01-21 (×14): 237 mL via ORAL

## 2024-01-15 NOTE — Evaluation (Addendum)
 Occupational Therapy Evaluation Patient Details Name: Philip Monroe MRN: [id number] DOB: 1975/04/04 Today's Date: 01/15/2024   History of Present Illness   Pt is a 48 y.o. male who presented 10/24 for right heart cath with plan for LVAD implantation. S/p dental teeth extraction 10/27. S/p Implantation of HeartMate 3 Left Ventricular Assist Device 10/30 PMH: aortic root dilatation, asthma, CAD, CHF, CKD stage II, gout, DM, HLD, HTN, IVCD, morbid obesity, left ventricular noncompation, NSVT, tobacco abuse.     Clinical Impressions Pt reports ind at baseline with ADLs, lives with his mom and daughter and works in consulting civil engineer. Pt in chair upon OT arrival, needs up to mod A for ADLs, mod +2 for bed mobility and mod +2 for transfers /ambulation with eva walker. Pt needs cues for sternal precautions, and cues for activity pacing throughout session. Pt presenting with impairments listed below, will follow acutely. Patient will benefit from intensive inpatient follow-up therapy, >3 hours/day to maximize safety/ind with ADL/functional mobility.      If plan is discharge home, recommend the following:   A lot of help with walking and/or transfers;A lot of help with bathing/dressing/bathroom;Assistance with cooking/housework;Direct supervision/assist for medications management;Assist for transportation;Direct supervision/assist for financial management;Help with stairs or ramp for entrance     Functional Status Assessment   Patient has had a recent decline in their functional status and demonstrates the ability to make significant improvements in function in a reasonable and predictable amount of time.     Equipment Recommendations   Other (comment) (defer)     Recommendations for Other Services   PT consult     Precautions/Restrictions   Precautions Precautions: Sternal;Fall Precaution Booklet Issued: Yes (comment) Recall of Precautions/Restrictions:  Impaired Precaution/Restrictions Comments: LVAD, swan, a-line, chest tube, wound vac, nitric oxide O2     Mobility Bed Mobility Overal bed mobility: Needs Assistance Bed Mobility: Sit to Supine       Sit to supine: Mod assist, +2 for physical assistance   General bed mobility comments: assist to bring feet into bed    Transfers Overall transfer level: Needs assistance Equipment used:  (eva walker) Transfers: Sit to/from Stand Sit to Stand: Mod assist, +2 physical assistance                  Balance Overall balance assessment: Needs assistance Sitting-balance support: No upper extremity supported, Feet supported Sitting balance-Leahy Scale: Fair     Standing balance support: Bilateral upper extremity supported Standing balance-Leahy Scale: Poor                             ADL either performed or assessed with clinical judgement   ADL Overall ADL's : Needs assistance/impaired Eating/Feeding: Set up;Sitting   Grooming: Set up;Sitting   Upper Body Bathing: Moderate assistance   Lower Body Bathing: Moderate assistance   Upper Body Dressing : Moderate assistance   Lower Body Dressing: Moderate assistance   Toilet Transfer: Moderate assistance           Functional mobility during ADLs: Minimal assistance;Moderate assistance;+2 for physical assistance       Vision   Vision Assessment?: No apparent visual deficits     Perception Perception: Not tested       Praxis Praxis: Not tested       Pertinent Vitals/Pain Pain Assessment Pain Assessment: Faces Pain Score: 8  Faces Pain Scale: Hurts whole lot Pain Location: sternum Pain Descriptors / Indicators: Aching, Operative site  guarding Pain Intervention(s): Limited activity within patient's tolerance, Monitored during session, Premedicated before session, Repositioned     Extremity/Trunk Assessment Upper Extremity Assessment Upper Extremity Assessment: Generalized weakness (reports  sensation changes in digits 1-3 of L hand)   Lower Extremity Assessment Lower Extremity Assessment: Defer to PT evaluation   Cervical / Trunk Assessment Cervical / Trunk Assessment: Normal   Communication Communication Communication: No apparent difficulties   Cognition Arousal: Alert Behavior During Therapy: WFL for tasks assessed/performed Cognition: No apparent impairments                               Following commands: Intact       Cueing  General Comments   Cueing Techniques: Verbal cues  VSS   Exercises     Shoulder Instructions      Home Living Family/patient expects to be discharged to:: Private residence Living Arrangements: Children;Parent Available Help at Discharge: Family;Available 24 hours/day Type of Home: Mobile home Home Access: Stairs to enter Entrance Stairs-Number of Steps: 3 Entrance Stairs-Rails: Can reach both Home Layout: One level     Bathroom Shower/Tub: Tub/shower unit;Walk-in shower   Bathroom Toilet: Standard     Home Equipment: Crutches          Prior Functioning/Environment Prior Level of Function : Independent/Modified Independent             Mobility Comments: uses crutches when he has gout flair up but otherwise no AD ADLs Comments: Working Buyer, Retail Problem List: Decreased strength;Decreased range of motion;Decreased activity tolerance;Impaired balance (sitting and/or standing);Decreased cognition;Decreased safety awareness;Cardiopulmonary status limiting activity;Decreased knowledge of precautions   OT Treatment/Interventions: Self-care/ADL training;Therapeutic exercise;Energy conservation;DME and/or AE instruction;Therapeutic activities;Patient/family education;Balance training      OT Goals(Current goals can be found in the care plan section)   Acute Rehab OT Goals Patient Stated Goal: none stated OT Goal Formulation: With patient Time For Goal Achievement:  01/29/24 Potential to Achieve Goals: Good ADL Goals Pt Will Perform Grooming: with modified independence;sitting Pt Will Perform Upper Body Dressing: with min assist;sitting;standing Pt Will Perform Lower Body Dressing: with min assist;sitting/lateral leans;sit to/from stand Pt Will Transfer to Toilet: with contact guard assist;ambulating;regular height toilet   OT Frequency:  Min 2X/week    Co-evaluation PT/OT/SLP Co-Evaluation/Treatment: Yes Reason for Co-Treatment: Complexity of the patient's impairments (multi-system involvement);Necessary to address cognition/behavior during functional activity;For patient/therapist safety;To address functional/ADL transfers   OT goals addressed during session: ADL's and self-care;Strengthening/ROM      AM-PAC OT 6 Clicks Daily Activity     Outcome Measure Help from another person eating meals?: A Little Help from another person taking care of personal grooming?: A Little Help from another person toileting, which includes using toliet, bedpan, or urinal?: A Lot Help from another person bathing (including washing, rinsing, drying)?: A Lot Help from another person to put on and taking off regular upper body clothing?: A Lot Help from another person to put on and taking off regular lower body clothing?: A Lot 6 Click Score: 14   End of Session Equipment Utilized During Treatment: Other (comment) (eva walker) Nurse Communication: Mobility status  Activity Tolerance: Patient tolerated treatment well Patient left: in bed;with call bell/phone within reach;with bed alarm set;with nursing/sitter in room  OT Visit Diagnosis: Unsteadiness on feet (R26.81);Other abnormalities of gait and mobility (R26.89);Muscle weakness (generalized) (M62.81)  Time: 1440-1533 OT Time Calculation (min): 53 min Charges:  OT General Charges $OT Visit: 1 Visit OT Evaluation $OT Eval Moderate Complexity: 1 Mod OT Treatments $Self Care/Home Management :  8-22 mins  Brileigh Sevcik K, OTD, OTR/L SecureChat Preferred Acute Rehab [phone]   Laneta POUR Koonce 01/15/2024, 4:58 PM

## 2024-01-15 NOTE — Progress Notes (Addendum)
 4 Days Post-Op Procedure(s) (LRB): INSERTION OF IMPLANTABLE LEFT VENTRICULAR ASSIST DEVICE USING HEARTMATE 3 (N/A) ECHOCARDIOGRAM, TRANSESOPHAGEAL, INTRAOPERATIVE (N/A) Subjective: Up in chair this morning, feels ok but having pain around lower left ribcage - improves with position changes Diuresing well, down 11 lbs from yesterday Walked yesterday Hemodynamics stable with milrinone  down to 0.25 Has not had bowel function since surgery  Objective: Vital signs in last 24 hours: BP 100/88   Pulse 78   Temp 97.9 F (36.6 C) (Oral)   Resp (!) 24   Ht 6' 3 (1.905 m)   Wt (!) 147 kg   SpO2 92%   BMI 40.51 kg/m  Filed Weights   01/13/24 0600 01/14/24 0500 01/15/24 0500  Weight: (!) 151.6 kg (!) 152 kg (!) 147 kg    Hemodynamic parameters for last 24 hours: PAP: (30-50)/(14-31) 31/22 CVP:  [0 mmHg-12 mmHg] 4 mmHg CO:  [5.4 L/min-9.3 L/min] 7.7 L/min CI:  [2 L/min/m2-3.5 L/min/m2] 2.9 L/min/m2  Intake/Output from previous day: 11/02 0701 - 11/03 0700 In: 1480.7 [P.O.:480; I.V.:900.5; IV Piggyback:100.1] Out: 7135 [Urine:7085; Chest Tube:50] Intake/Output this shift: No intake/output data recorded.  Physical Exam: General - Resting comfortably in chair CV - RRR Resp - Unlabored on [place] Abd - Soft, ND/NT Ext - Moderate lower extremity edema  Lab Results:    Latest Ref Rng & Units 01/15/2024    5:58 AM 01/15/2024    5:29 AM 01/14/2024    6:41 AM  CBC  WBC 4.0 - 10.5 K/uL  14.0    Hemoglobin 13.0 - 17.0 g/dL 87.3  88.7  87.7   Hematocrit 39.0 - 52.0 % 37.0  33.7  36.0   Platelets 150 - 400 K/uL  161        Latest Ref Rng & Units 01/15/2024    5:58 AM 01/15/2024    5:29 AM 01/14/2024    6:41 AM  CMP  Glucose 70 - 99 mg/dL  885    BUN 6 - 20 mg/dL  18    Creatinine 9.38 - 1.24 mg/dL  8.80    Sodium 864 - 854 mmol/L 126  127  126   Potassium 3.5 - 5.1 mmol/L 3.2  3.2  3.7   Chloride 98 - 111 mmol/L  84    CO2 22 - 32 mmol/L  29    Calcium 8.9 - 10.3 mg/dL  8.7       CXR: Stable, no large effusion or pneumothorax  Assessment/Plan: S/P Procedure(s) (LRB): INSERTION OF IMPLANTABLE LEFT VENTRICULAR ASSIST DEVICE USING HEARTMATE 3 (N/A) ECHOCARDIOGRAM, TRANSESOPHAGEAL, INTRAOPERATIVE (N/A) POD 4 s/p HM 3 implantation NEURO- intact  Pain control PRN - sounds like he's having some pump pain, would go up on gabapentin and possibly oxycodone  PRN Continue to ambulate daily CV- in SR around 80 bpm             Keep pacing wires capped for today  Continue to wean milrinone  per HF team RESP- Continued improved lung aeration             Continue IS, pulm hygiene, ambulation  Continue Chest tubes RENAL- creatinine and lytes Ok  Weight down 11 lbs from yesterday             Continue diuresis per HF team  Foley will stay for today GI- tolerating regular diet  BM: No BM or flatus since surgery, will encourage patient to try sitting on commode today Endo- BG well controlled Continue ISS ID- NAI DVT ppx -  SCD + start lovenox  today while awaiting therapeutic INR  Dispo: ICU   LOS: 10 days    Con RAMAN Lyndal Reggio 01/15/2024

## 2024-01-15 NOTE — Progress Notes (Signed)
 H&V Care Navigation CSW Progress Note  Outpatient Heart Failure CSW met with pt at bedside to check in.  Pt friend, Madeleine, also at bedside visiting.  Patient sitting up in chair and reports doing well over all- states he has no needs at this time.  CSW will continue to follow during inpatient stay and assist as needed  Loraine Bhullar H. Anneth Brunell, LCSW Clinical Social Worker [facility] Desk#: [phone] Cell#: [phone]

## 2024-01-15 NOTE — Progress Notes (Signed)
 Physical Therapy Treatment Patient Details Name: Philip Monroe MRN: [id number] DOB: May 20, 1975 Today's Date: 01/15/2024   History of Present Illness Pt is a 48 y.o. male who presented 10/24 for right heart cath with plan for LVAD implantation. S/p dental teeth extraction 10/27. S/p Implantation of HeartMate 3 Left Ventricular Assist Device 10/30 PMH: aortic root dilatation, asthma, CAD, CHF, CKD stage II, gout, DM, HLD, HTN, IVCD, morbid obesity, left ventricular noncompation, NSVT, tobacco abuse.    PT Comments  Good progress towards acute functional goals. VSS throughout on 4L today with iNO still applied - managed by RT during ambulatory bout, up to 95 feet this visit and +3 (RN, OT, RT) support for line/lead/equipment management. Mod assist +2 for bed mobility and transfers. Utilized eva walker for gait. Favor CIR follow and monitor progress during acute admission. Patient will continue to benefit from skilled physical therapy services to further improve independence with functional mobility.    If plan is discharge home, recommend the following: Two people to help with walking and/or transfers;Two people to help with bathing/dressing/bathroom;Assistance with cooking/housework;Assist for transportation;Help with stairs or ramp for entrance   Can travel by private vehicle        Equipment Recommendations  BSC/3in1;Rollator (4 wheels) (Pending progress)    Recommendations for Other Services Rehab consult     Precautions / Restrictions Precautions Precautions: Sternal;Fall Precaution Booklet Issued: Yes (comment) Recall of Precautions/Restrictions: Impaired Precaution/Restrictions Comments: LVAD, swan, a-line, chest tube, wound vac Restrictions Weight Bearing Restrictions Per Provider Order: Yes RUE Weight Bearing Per Provider Order: Non weight bearing LUE Weight Bearing Per Provider Order: Non weight bearing Other Position/Activity Restrictions: Cardiac Sternal Precautions      Mobility  Bed Mobility Overal bed mobility: Needs Assistance Bed Mobility: Sit to Supine       Sit to supine: Mod assist, +2 for physical assistance   General bed mobility comments: assist to bring feet into bed, support trunk, holding heart pillow for comfort. Cues for technique.    Transfers Overall transfer level: Needs assistance Equipment used:  (eva walker) Transfers: Sit to/from Stand Sit to Stand: Mod assist, +2 physical assistance, +2 safety/equipment           General transfer comment: Mod assist +2 for boost to stand from recliner (low for his height.) Required 2 attempts, cues for technique, holding heart pillow for comfort/precautions. Smooth transition to Time warner.    Ambulation/Gait Ambulation/Gait assistance: Min assist, +2 safety/equipment, +2 physical assistance Gait Distance (Feet): 90 Feet Assistive device: Elyn Finder Gait Pattern/deviations: Step-through pattern, Decreased stride length, Wide base of support, Drifts right/left, Trunk flexed Gait velocity: dec Gait velocity interpretation: <1.31 ft/sec, indicative of household ambulator Pre-gait activities: Weight shift static march General Gait Details: Min assist for eva control, +2-3 for equipment management (iNO) with respiratory therapy. Cues for upright stance. Several standing rest breaks to take deep breaths due to SvO2 dropping periodically but SpO2 90% and greater throughout.   Stairs             Wheelchair Mobility     Tilt Bed    Modified Rankin (Stroke Patients Only)       Balance Overall balance assessment: Needs assistance Sitting-balance support: No upper extremity supported, Feet supported Sitting balance-Leahy Scale: Fair     Standing balance support: Bilateral upper extremity supported Standing balance-Leahy Scale: Poor Standing balance comment: Hand held support in standing.  Communication Communication Communication:  No apparent difficulties  Cognition Arousal: Alert Behavior During Therapy: WFL for tasks assessed/performed   PT - Cognitive impairments: No apparent impairments                         Following commands: Intact      Cueing Cueing Techniques: Verbal cues  Exercises General Exercises - Lower Extremity Ankle Circles/Pumps: AROM, Both, 10 reps, Supine Quad Sets: Strengthening, Both, 10 reps, Supine Gluteal Sets: Strengthening, Both, 10 reps, Seated    General Comments General comments (skin integrity, edema, etc.): Spo2 91-94% on 4L with iNO attachment MAP 74 via aline. HR 80s-90s throughout. OT with education for LVAD management.      Pertinent Vitals/Pain Pain Assessment Pain Assessment: Faces Faces Pain Scale: Hurts whole lot Pain Location: sternum Pain Descriptors / Indicators: Aching, Operative site guarding Pain Intervention(s): Limited activity within patient's tolerance, Monitored during session, Repositioned    Home Living Family/patient expects to be discharged to:: Private residence Living Arrangements: Children;Parent Available Help at Discharge: Family;Available 24 hours/day Type of Home: Mobile home Home Access: Stairs to enter Entrance Stairs-Rails: Can reach both Entrance Stairs-Number of Steps: 3   Home Layout: One level Home Equipment: Crutches      Prior Function            PT Goals (current goals can now be found in the care plan section) Acute Rehab PT Goals Patient Stated Goal: Reduce pain, improve strength PT Goal Formulation: With patient Time For Goal Achievement: 01/27/24 Potential to Achieve Goals: Good Progress towards PT goals: Progressing toward goals    Frequency    Min 3X/week      PT Plan      Co-evaluation PT/OT/SLP Co-Evaluation/Treatment: Yes Reason for Co-Treatment: Complexity of the patient's impairments (multi-system involvement);Necessary to address cognition/behavior during functional activity;For  patient/therapist safety;To address functional/ADL transfers PT goals addressed during session: Mobility/safety with mobility;Balance;Proper use of DME;Strengthening/ROM OT goals addressed during session: ADL's and self-care;Strengthening/ROM      AM-PAC PT 6 Clicks Mobility   Outcome Measure  Help needed turning from your back to your side while in a flat bed without using bedrails?: A Little Help needed moving from lying on your back to sitting on the side of a flat bed without using bedrails?: A Lot Help needed moving to and from a bed to a chair (including a wheelchair)?: A Lot Help needed standing up from a chair using your arms (e.g., wheelchair or bedside chair)?: A Lot Help needed to walk in hospital room?: A Lot Help needed climbing 3-5 steps with a railing? : Total 6 Click Score: 12    End of Session Equipment Utilized During Treatment: Oxygen Activity Tolerance: Patient tolerated treatment well Patient left: with call bell/phone within reach;in bed;with bed alarm set;with nursing/sitter in room;with SCD's reapplied Nurse Communication: Mobility status PT Visit Diagnosis: Difficulty in walking, not elsewhere classified (R26.2);Unsteadiness on feet (R26.81);Other abnormalities of gait and mobility (R26.89);Muscle weakness (generalized) (M62.81);Pain Pain - part of body:  (Sternum)     Time: 8555-8462 PT Time Calculation (min) (ACUTE ONLY): 53 min  Charges:    $Gait Training: 8-22 mins $Therapeutic Activity: 8-22 mins PT General Charges $$ ACUTE PT VISIT: 1 Visit                     Leontine Roads, PT, DPT [facility]  Rehabilitation Services Physical Therapist Office: [phone] Website: [email or web address]    Leontine GORMAN Roads  01/15/2024, 5:12 PM

## 2024-01-15 NOTE — Progress Notes (Signed)
 Nutrition Follow-up  DOCUMENTATION CODES:   Not applicable  INTERVENTION:   Liberalize diet to REGULAR (No Salt packets) until appetite improves and pt eating adequately Discontinue double portions at meal time due to poor appetite  Discussed small, frequent meals as way to increased overal oral intake when appetite is lacking. Pt agreeable to small protein containing snack TID between meals  Ensure Plus High Protein po TID, each supplement provides 350 kcal and 20 grams of protein  NUTRITION DIAGNOSIS:   Increased nutrient needs related to chronic illness (CHF) as evidenced by estimated needs.  Continues but being addressed  GOAL:   Patient will meet greater than or equal to 90% of their needs  Being addressed  MONITOR:   PO intake, Supplement acceptance  REASON FOR ASSESSMENT:   Consult Assessment of nutrition requirement/status, LVAD Eval  ASSESSMENT:   48 year old male with history of chronic HFrEF, CAD, PVCs, morbid obesity, tobacco use, possible prior LV thrombus, gout. Admitted for preVAD optimization.  10/24 - right heart cath revealed with low filling pressures and reduced Cl; swan left in for hemodynamic monitoring 10/27 - multiple teeth extracted 10/30 - LVAD  10/31 - Extubated, CL diet 11/01 - advanced to Heart Healthy/Carb Mod  Currently on 4L [place], Remains on iNO-weaning. Pt sitting up in recliner, friend at bedside.   Pt reports no appetite as biggest barrier to adequate po intake at present. Pt denies any taste changes, reports food tastes the same as it always does.   Pt working with therapies, ambulating with assistance  Denies nausea, early satiety, abdominal discomfort.  Pt is constipated, being addressed.   Pt denies any pain with chewing currently post dental extractions pre-op.   Pre-Op pt eating well; nutrition optimized by allowing double entree/protein portions at meal times. Currently pt with no appetite, not able to eat much but  forcing himself to eat a little. Discussed strategy of small frequent meals currently to optimize oral intake while appetite is poor; plan to discontinue double portions for now.   Labs: Sodium 127 (L) Potassium 3.2 (L) Phosphorus 3.5 (wdl) Magnesium 1.7 (wdl)  Meds: Dulcolax daily Colace daily Lasix  gtt SS novolog  Semglee Kcl Mag Sulfate  Diet Order:  HEART HEALTHY/CARB MODIFIED   EDUCATION NEEDS:   No education needs have been identified at this time  Skin:  Skin Assessment: Reviewed RN Assessment  Last BM:  10/29  Height:   Ht Readings from Last 1 Encounters:  01/11/24 6' 3 (1.905 m)    Weight:   Wt Readings from Last 1 Encounters:  01/15/24 (!) 147 kg    Ideal Body Weight:  89.1 kg  BMI:  Body mass index is 40.51 kg/m.  Estimated Nutritional Needs:   Kcal:  2500-2700  Protein:  140-155g  Fluid:  >2L/day    Betsey Finger MS, RDN, LDN, CNSC Registered Dietitian 3 Clinical Nutrition RD Inpatient Contact Info in Amion

## 2024-01-15 NOTE — Progress Notes (Signed)
 LVAD Coordinator Rounding Note:  Admitted 12/25/23 due to CHF and planned teeth extractions prior to VAD implant.   HM3 LVAD implanted on 01/11/24 by BS under DT criteria.  Pt sitting in chair on my arrival. States he ambulated in the hallway over the weekend. Weaning Nitric.   Speed reduced to 5400 per Dr. Zenaida today. Pt having >100+ PI events and speed drops.   Vital signs: Temp:98.3 HR: 87 Doppler Pressure: 90 Automatic BP: 102/86 (93) O2 Sat: 91% 4L [place] NO 3ppm Wt: 319>349.2 lbs    LVAD interrogation reveals:   Speed: 5500 Flow: 5.2 Power:  4.1 PI:3.5  Alarms: none Events:  100+ Hematocrit: 33 Fixed speed: 5500 Low speed limit: 5200  Drive Line: Existing VAD dressing removed and site care performed using sterile technique. Drive line exit site cleaned with Chlora prep applicators x 2, allowed to dry, and gauze dressing with Silverlon patch applied. Exit site with red rubber and 1 suture intact, unincorporated, the velour is fully implanted at exit site. Scant amount of bloody drainage. No redness, tenderness, foul odor or rash noted. Drive line anchor re-applied. Advance dressing changes to MWF. Next dressing change tomorrow 01/17/24 by VAD coordinator or nurse champion only.        Labs:  LDH trend: 274>292  INR trend: 1.2>1.4  Anticoagulation Plan: -INR Goal: 2-2.5 -ASA Dose: none  Blood Products:  - <intra-op/post-op> 01/11/24 - 4 FFP - 625 cc of cell saver  Device: -n/a -Therapies:  Arrythmias: frequent  PVCs in OR  Respiratory:on Vent 40 % NO @ 20  Renal:  -CRT: 1.35>1.09  Gtts: Amio 30 mg/hr Milrinone  0.25 mcg/kg/min  Adverse Events on VAD:  Patient Education: Education provided on daily dressing changes and importance on compliance and sterile technique.  Plan/Recommendations:  1. Please page VAD coordinator for any equipment issues or VAD alarms as well as for any procedures on VAD pts. 2. Daily driveline dressing changes by VAD  coordinator or nurse champion  Schuyler Lunger RN, BSN VAD Coordinator 24/7 Pager [phone]

## 2024-01-15 NOTE — Progress Notes (Signed)
 This chaplain is present for F/U spiritual care. The Pt. is awake and sitting in bedside recliner.   The Pt. shares his celebrations; one of which is walking in the hall, notes his daughter has called every day, and communicates his lack of appetite.    The chaplain affirms the Pt. personal strengths he identified last week and noted how the strengths will travel with him this week.  This chaplain is available for F/U spiritual care as needed.  Chaplain Leeroy Hummer [phone]

## 2024-01-15 NOTE — Progress Notes (Signed)
 Pt walked with PT/RT/RN for physical therapy while on nitric.

## 2024-01-15 NOTE — Progress Notes (Signed)
 Nitric turned off and pt is tolerating well at this time

## 2024-01-15 NOTE — Progress Notes (Signed)
 Peripherally Inserted Central Catheter Placement  The IV Nurse has discussed with the patient and/or persons authorized to consent for the patient, the purpose of this procedure and the potential benefits and risks involved with this procedure.  The benefits include less needle sticks, lab draws from the catheter, and the patient may be discharged home with the catheter. Risks include, but not limited to, infection, bleeding, blood clot (thrombus formation), and puncture of an artery; nerve damage and irregular heartbeat and possibility to perform a PICC exchange if needed/ordered by physician.  Alternatives to this procedure were also discussed.  Bard Power PICC patient education guide, fact sheet on infection prevention and patient information card has been provided to patient /or left at bedside.    PICC Placement Documentation  PICC Double Lumen 01/15/24 Right Basilic 43 cm 0 cm (Active)  Indication for Insertion or Continuance of Line Vasoactive infusions 01/15/24 1800  Exposed Catheter (cm) 0 cm 01/15/24 1800  Site Assessment Clean, Dry, Intact 01/15/24 1800  Lumen #1 Status Flushed;Saline locked;Blood return noted 01/15/24 1800  Lumen #2 Status Flushed;Saline locked;Blood return noted 01/15/24 1800  Dressing Type Securing device;Transparent 01/15/24 1800  Dressing Status Antimicrobial disc/dressing in place;Clean, Dry, Intact 01/15/24 1800  Line Care Connections checked and tightened 01/15/24 1800  Line Adjustment (NICU/IV Team Only) No 01/15/24 1800  Dressing Intervention New dressing;Adhesive placed at insertion site (IV team only) 01/15/24 1800  Dressing Change Due 01/22/24 01/15/24 1800       Renaee Neville Skillern 01/15/2024, 6:19 PM

## 2024-01-15 NOTE — Progress Notes (Signed)
   [address], [place] [address]             [phone]    POD # 4  Heart Mate 3  BP 100/78   Pulse 73   Temp 98.6 F (37 C) (Oral)   Resp 20   Ht 6' 3 (1.905 m)   Wt (!) 147 kg   SpO2 91%   BMI 40.51 kg/m  38/16 CVP 8  Milrinone  0.25   Intake/Output Summary (Last 24 hours) at 01/15/2024 1715 Last data filed at 01/15/2024 1200 Gross per 24 hour  Intake 1257.28 ml  Output 4535 ml  Net -3277.72 ml   Norva out  Doing well  [employer]. Kerrin, MD [facility] [phone]

## 2024-01-15 NOTE — Plan of Care (Signed)
  Problem: Education: Goal: Understanding of CV disease, CV risk reduction, and recovery process will improve Outcome: Progressing   Problem: Activity: Goal: Ability to return to baseline activity level will improve Outcome: Progressing   Problem: Cardiovascular: Goal: Ability to achieve and maintain adequate cardiovascular perfusion will improve Outcome: Progressing Goal: Vascular access site(s) Level 0-1 will be maintained Outcome: Progressing   Problem: Activity: Goal: Risk for activity intolerance will decrease Outcome: Progressing   Problem: Pain Managment: Goal: General experience of comfort will improve and/or be controlled Outcome: Progressing   Problem: Safety: Goal: Ability to remain free from injury will improve Outcome: Progressing   Problem: Cardiac: Goal: Ability to maintain an adequate cardiac output will improve Outcome: Progressing   Problem: Coping: Goal: Level of anxiety will decrease Outcome: Progressing   Problem: Cardiac: Goal: Ability to maintain an adequate cardiac output will improve Outcome: Progressing

## 2024-01-15 NOTE — Progress Notes (Signed)
 NAME:  Philip Monroe, MRN:  [id number], DOB:  [birth year], LOS: 10 ADMISSION DATE:  01/05/2024, CONSULTATION DATE: 10/30  REFERRING MD:  MD Su CHIEF COMPLAINT:  LVAD  History of Present Illness:  Patient is a 48 year old male with significant past medical history of chronic HFrEF < 20%, CAD multivessel (recent [facility] 5/24), followed in past by [facility] Cards for iCM/noncompaction CM, pulmonary HTN, HTN, CKD stage III, diabetes type 2, HLD, hx of ETOH use, multiple recent admission of CHF exacerbations- most recent in 9/25 who was admitted on 01/05/24 for HF optimization, and also teeth extraction x 4 (occurred on 10/27) for LVAD surgery on 01/11/24 by MD Su with TCTS. PCCM consulted to assist with post-op vent management as well as critical care needs during ICU stay.   Estimated blood loss 800 mL  4 of FFP given Protamine given for heparin  reversal DDAVP  Pertinent  Medical History   Past Medical History:  Diagnosis Date   Aortic root dilatation    a. 07/2022 Echo: Ao root 4.4cm, Asc Ao 4.0cm.   Asthma    as a child, no problems as an adult, rarely uses inhaler   CAD (coronary artery disease)    a. 08/2017 Cath: LM nl, LAD 41m/d, D1 70ost, LCX 113m, OM1 90 inf branch, 60 sup branch, OM2 small, RCA 30 diffuse, RPDA 100 w/ L->R collats; b. 07/2022 Echo: LM nl, LAD 78m, D1 80, LCX 159m, OM1 80, RCA 5m, RPDA 100-->No targets for PCI/CABG-->Med rx.   CHF (congestive heart failure) (HCC)    takes lasix    Chronic HFrEF (heart failure with reduced ejection fraction) (HCC)    a. 03/2015 Echo: EF 35-40%; b. 08/2017 Echo: EF 15%; c. 08/2017 cMRI: EF 35%; d. 06/2018 Echo: EF 10-15%; e. 07/2022 Echo: EF 15-20%, grIII DD, mild MR, sev dil LA, mod-sev PH, Ao root 4.4cm, Asc Ao 4.0cm; f. 07/2022 RHC: RA 4, RV 38/3, PA 43/18 (26), PCWP 11. CO/CI 5.3/1.8.   CKD (chronic kidney disease), stage II    Diabetes mellitus without complication (HCC)    type 2   Dyspnea    with exertion   Gout    Hyperlipidemia LDL goal  <70    Hypertension    Ischemic cardiomyopathy    a. 03/2015 Echo: EF 35-40%; b. 08/2017 Echo: EF 15%; c. 08/2017 cMRI: EF 35%; d. 06/2018 Echo: EF 10-15%; e. 07/2022 Echo: EF 15-20%, grIII DD.   IVCD (intraventricular conduction defect)    Left ventricular noncompaction [facility])    Morbid obesity (HCC)    NSVT (nonsustained ventricular tachycardia) (HCC)    a. 09/2022 Event monitor: predominantly sinus rhythm, avg HR 81 (46-167). 17 runs NSVT (fastest/longest 13 beats x 167). Rare PACs, 17.5% PVC burden.   PVC's (premature ventricular contractions)    a. 09/2022 Event Monitor: 17.5% PVC burden.   Statin intolerance    Tobacco abuse      Significant Hospital Events: Including procedures, antibiotic start and stop dates in addition to other pertinent events   10/24 Admit for HF optimization for LVAD surgery on 10/30 10/27 Teeth Extraction x 4 by oral surgery  10/30 HM3 LVAD placement, PCCM consult for post-op management  11/3 remains on milrinone  0.25, no pressor requirement  Interim History / Subjective:  Pt sitting up in the chair, no overnight events  Remains on milrinone , amiodarone  and lasix  gtt's  Feels constant chest discomfort, though states this is not severe Has not had a BM yet  7L UOP  CO  7.7, CI 2.9 CVP 6 LVAD speed 5500, flow 4.9, PI 3.9 50cc CT output   Objective    Blood pressure 107/82, pulse 71, temperature 97.9 F (36.6 C), temperature source Oral, resp. rate (!) 22, height 6' 3 (1.905 m), weight (!) 147 kg, SpO2 94%. PAP: (23-50)/(14-31) 23/17 CVP:  [0 mmHg-12 mmHg] 4 mmHg CO:  [5.4 L/min-9.3 L/min] 7.7 L/min CI:  [2 L/min/m2-3.5 L/min/m2] 2.9 L/min/m2      Intake/Output Summary (Last 24 hours) at 01/15/2024 0842 Last data filed at 01/15/2024 0700 Gross per 24 hour  Intake 1306.44 ml  Output 6920 ml  Net -5613.56 ml   Filed Weights   01/13/24 0600 01/14/24 0500 01/15/24 0500  Weight: (!) 151.6 kg (!) 152 kg (!) 147 kg    General:  well nourished M  sitting up in bed in NAD  HEENT: MM pink/moist Neuro: alert and oriented and moving all extremities  CV: s1s2, paced, LVAD hum PULM:  clear bilaterally on [place] GI: soft, mildly distended without TTP Extremities: warm/dry,  1+ edema   Significant labs  K 3.2 Na 126 Coox 60% LDH 292    Resolved problem list   Assessment and Plan   Acute hypoxic respiratory failure postoperatively -improving - Extubated 10/31 - On nitric oxide - Currently on 4 L nasal cannula -encourage IS and mobilization   Chronic systolic HF s/p LVAD HM III  mvCAD Mild pHTN PVCs with prior poor amio tolerance  Hx HTN  Hx HLD  Hypervolemic hyponatremia stable sodiums around 126. -post op, surgical care per CVTS -plan to move and watch CT ouput today -complete ppx abx  -adv HF following, recs appreciated -starting Lasix  drip today stopped epinephrine. -follow coox ldh -milrinone , continue -iNO per HF.  -continue amiodarone   - On mexitil   -Blood sugars better controlled on basal bolus insulin  regimen. -continue Coumadin  -statin, no ASA w AC above  -lasix  gtt, currently on 10mg /hr  -increase neuronitin to 400mg  bid and oxycodone  10-15mg  prn -ok to use bedside commode   CKD II Hypokalemia  -follow renal indices UOP  -creatinine stable with good UOP -replete K prn   Expected ABLA, thrombocytopenia  -follow CBC   Morbid obesity -no acute intervention   S/p Teeth Extraction 10/27  -Per oral surgery  Gout  -resume home meds when appropriate   Labs   CBC: Recent Labs  Lab 01/12/24 0500 01/12/24 0558 01/12/24 1613 01/12/24 1614 01/13/24 0357 01/13/24 0407 01/14/24 0502 01/14/24 0641 01/15/24 0529 01/15/24 0558  WBC 14.4*  --  15.3*  --  16.9*  --  16.7*  --  14.0*  --   NEUTROABS 10.9*  --   --   --  12.2*  --  12.0*  --  9.4*  --   HGB 10.9*   < > 10.4*   < > 10.7* 11.6* 11.1* 12.2* 11.2* 12.6*  HCT 33.0*   < > 32.6*   < > 33.8* 34.0* 34.5* 36.0* 33.7* 37.0*  MCV 79.3*  --   80.5  --  81.3  --  80.2  --  78.0*  --   PLT 123*  --  110*  --  128*  --  130*  --  161  --    < > = values in this interval not displayed.    Basic Metabolic Panel: Recent Labs  Lab 01/12/24 0500 01/12/24 9441 01/12/24 1613 01/12/24 1614 01/13/24 0357 01/13/24 0407 01/13/24 1655 01/14/24 0502 01/14/24 9358 01/15/24 0529 01/15/24 9441  NA 131*   < > 131*   < > 127*   < > 126* 126* 126* 127* 126*  K 5.0   < > 4.4   < > 4.7   < > 4.5 3.8 3.7 3.2* 3.2*  CL 98  --  98  --  95*  --  93* 89*  --  84*  --   CO2 23  --  24  --  23  --  21* 26  --  29  --   GLUCOSE 121*  --  137*  --  126*  --  124* 127*  --  114*  --   BUN 15  --  13  --  11  --  11 11  --  18  --   CREATININE 1.35*  --  1.36*  --  1.23  --  1.03 1.07  --  1.19  --   CALCIUM 8.3*  --  8.4*  --  8.4*  --  8.0* 8.6*  --  8.7*  --   MG 2.4  --  2.2  --  1.9  --   --  1.5*  --  1.7  --   PHOS 4.0  --   --   --  3.5  --   --  3.4  --  3.5  --    < > = values in this interval not displayed.   GFR: Estimated Creatinine Clearance: 118.9 mL/min (by C-G formula based on SCr of 1.19 mg/dL). Recent Labs  Lab 01/12/24 1613 01/13/24 0357 01/14/24 0502 01/15/24 0529  WBC 15.3* 16.9* 16.7* 14.0*    Liver Function Tests: Recent Labs  Lab 01/12/24 0500 01/13/24 0357 01/14/24 0502  AST 68* 60* 37  ALT 25 21 16   ALKPHOS 44 47 54  BILITOT 1.5* 1.8* 1.3*  PROT 5.5* 5.6* 6.2*  ALBUMIN 3.3* 3.0* 3.0*   No results for input(s): LIPASE, AMYLASE in the last 168 hours. No results for input(s): AMMONIA in the last 168 hours.  ABG    Component Value Date/Time   PHART 7.536 (H) 01/15/2024 0558   PCO2ART 39.7 01/15/2024 0558   PO2ART 76 (L) 01/15/2024 0558   HCO3 33.6 (H) 01/15/2024 0558   TCO2 35 (H) 01/15/2024 0558   ACIDBASEDEF 2.0 01/13/2024 0407   O2SAT 96 01/15/2024 0558     Coagulation Profile: Recent Labs  Lab 01/11/24 1456 01/12/24 0500 01/13/24 0357 01/14/24 0502 01/15/24 0529  INR 1.2  1.2  1.2 1.5* 1.4* 1.4*    Cardiac Enzymes: No results for input(s): CKTOTAL, CKMB, CKMBINDEX, TROPONINI in the last 168 hours.  HbA1C: Hgb A1c MFr Bld  Date/Time Value Ref Range Status  11/29/2023 04:45 AM 6.6 (H) 4.8 - 5.6 % Final    Comment:    (NOTE) Diagnosis of Diabetes The following HbA1c ranges recommended by the American Diabetes Association (ADA) may be used as an aid in the diagnosis of diabetes mellitus.  Hemoglobin             Suggested A1C NGSP%              Diagnosis  <5.7                   Non Diabetic  5.7-6.4                Pre-Diabetic  >6.4                   Diabetic  <  7.0                   Glycemic control for                       adults with diabetes.      CBG: Recent Labs  Lab 01/14/24 0853 01/14/24 1136 01/14/24 1640 01/14/24 2105 01/15/24 0624  GLUCAP 142* 132* 123* 132* 136*   CRITICAL CARE Performed by: Leita SAUNDERS Khamya Topp   Total critical care time: 35 minutes  Critical care time was exclusive of separately billable procedures and treating other patients.  Critical care was necessary to treat or prevent imminent or life-threatening deterioration.  Critical care was time spent personally by me on the following activities: development of treatment plan with patient and/or surrogate as well as nursing, discussions with consultants, evaluation of patient's response to treatment, examination of patient, obtaining history from patient or surrogate, ordering and performing treatments and interventions, ordering and review of laboratory studies, ordering and review of radiographic studies, pulse oximetry and re-evaluation of patient's condition.  Leita SAUNDERS Mcihael Hinderman, PA-C [facility] See Amion for pager If no response to pager , please call [phone] until 7pm After 7:00 pm call Elink  [phone]

## 2024-01-15 NOTE — Progress Notes (Cosign Needed Addendum)
 Patient ID: Philip Monroe, male   DOB: [birth year], 48 y.o.   MRN: [id number]   Advanced Heart Failure Rounding Note  Chief Complaint: s/p HM3 Implant Subjective:    10/30 S/P HMIII  On 0.25 milrinone  + iNO 50 ppm + amiodarone  gtt at 30/hr.   [place]: CVP 6 PA 41/27 PCWP 23 TD CI 2.4 Co-ox 61%  LVAD Interrogation HM III: Speed: 5500 Flow: 4.8 PI: 3.7 Power: 4.2. Numerous PI events with speed drops.   Objective:    Vital Signs:   Temp:  [97.9 F (36.6 C)-100 F (37.8 C)] 98.3 F (36.8 C) (11/03 1128) Pulse Rate:  [28-96] 73 (11/03 1200) Resp:  [15-34] 20 (11/03 1200) BP: (90-121)/(66-96) 100/78 (11/03 1500) SpO2:  [90 %-97 %] 91 % (11/03 1200) Arterial Line BP: (81-107)/(69-87) 92/87 (11/03 1145) Weight:  [147 kg] 147 kg (11/03 0500) Last BM Date : 01/10/24  Weight change: Filed Weights   01/13/24 0600 01/14/24 0500 01/15/24 0500  Weight: (!) 151.6 kg (!) 152 kg (!) 147 kg   Intake/Output:  Intake/Output Summary (Last 24 hours) at 01/15/2024 1621 Last data filed at 01/15/2024 1200 Gross per 24 hour  Intake 1289.67 ml  Output 4935 ml  Net -3645.33 ml    Physical Exam  GENERAL: no acute distress. NECK: JVP 7-8  CARDIAC:  Mechanical heart sounds with LVAD hum present.  LUNGS:  Clear to auscultation bilaterally.  LVAD exit site:   Dressing dry and intact.  Stabilization device present and accurately applied.   EXTREMITIES:  No edema  NEUROLOGIC:  Alert and oriented x 4.  Affect pleasant.      Telemetry: SR with occasional PVCs Labs: Basic Metabolic Panel: Recent Labs  Lab 01/12/24 0500 01/12/24 0558 01/12/24 1613 01/12/24 1614 01/13/24 0357 01/13/24 0407 01/13/24 1655 01/14/24 0502 01/14/24 0641 01/15/24 0529 01/15/24 0558  NA 131*   < > 131*   < > 127*   < > 126* 126* 126* 127* 126*  K 5.0   < > 4.4   < > 4.7   < > 4.5 3.8 3.7 3.2* 3.2*  CL 98  --  98  --  95*  --  93* 89*  --  84*  --   CO2 23  --  24  --  23  --  21* 26  --  29  --   GLUCOSE  121*  --  137*  --  126*  --  124* 127*  --  114*  --   BUN 15  --  13  --  11  --  11 11  --  18  --   CREATININE 1.35*  --  1.36*  --  1.23  --  1.03 1.07  --  1.19  --   CALCIUM 8.3*  --  8.4*  --  8.4*  --  8.0* 8.6*  --  8.7*  --   MG 2.4  --  2.2  --  1.9  --   --  1.5*  --  1.7  --   PHOS 4.0  --   --   --  3.5  --   --  3.4  --  3.5  --    < > = values in this interval not displayed.   Liver Function Tests: Recent Labs  Lab 01/12/24 0500 01/13/24 0357 01/14/24 0502  AST 68* 60* 37  ALT 25 21 16   ALKPHOS 44 47 54  BILITOT 1.5* 1.8* 1.3*  PROT 5.5*  5.6* 6.2*  ALBUMIN 3.3* 3.0* 3.0*   No results for input(s): LIPASE, AMYLASE in the last 168 hours. No results for input(s): AMMONIA in the last 168 hours.  CBC: Recent Labs  Lab 01/12/24 0500 01/12/24 0558 01/12/24 1613 01/12/24 1614 01/13/24 0357 01/13/24 0407 01/14/24 0502 01/14/24 0641 01/15/24 0529 01/15/24 0558  WBC 14.4*  --  15.3*  --  16.9*  --  16.7*  --  14.0*  --   NEUTROABS 10.9*  --   --   --  12.2*  --  12.0*  --  9.4*  --   HGB 10.9*   < > 10.4*   < > 10.7* 11.6* 11.1* 12.2* 11.2* 12.6*  HCT 33.0*   < > 32.6*   < > 33.8* 34.0* 34.5* 36.0* 33.7* 37.0*  MCV 79.3*  --  80.5  --  81.3  --  80.2  --  78.0*  --   PLT 123*  --  110*  --  128*  --  130*  --  161  --    < > = values in this interval not displayed.   BNP (last 3 results) Recent Labs    12/25/23 1139 01/05/24 1907 01/12/24 0500  BNP 315.6* 674.8* 623.8*   Medications:    Scheduled Medications:  acetaminophen   1,000 mg Oral Q6H   Or   acetaminophen  (TYLENOL ) oral liquid 160 mg/5 mL  1,000 mg Per Tube Q6H   acetaZOLAMIDE  500 mg Oral BID   bisacodyl  10 mg Oral Daily   Or   bisacodyl  10 mg Rectal Daily   Chlorhexidine  Gluconate Cloth  6 each Topical Daily   docusate sodium  200 mg Oral Daily   enoxaparin  (LOVENOX ) injection  40 mg Subcutaneous Daily   ezetimibe   10 mg Oral Daily   feeding supplement  237 mL Oral TID BM    gabapentin  400 mg Oral BID   insulin  aspart  0-24 Units Subcutaneous TID WC   insulin  glargine-yfgn  18 Units Subcutaneous Daily   mexiletine  150 mg Oral Q8H   pantoprazole  40 mg Oral Daily   potassium chloride   40 mEq Oral Q4H   sodium chloride  flush  3 mL Intravenous Q12H   spironolactone   25 mg Oral Daily   warfarin  5 mg Oral Once   Warfarin - Physician Dosing Inpatient   Does not apply q1600    Infusions:  amiodarone  30 mg/hr (01/15/24 1348)   furosemide  (LASIX ) 200 mg in dextrose  5 % 100 mL (2 mg/mL) infusion 10 mg/hr (01/15/24 1200)   magnesium sulfate bolus IVPB     milrinone  0.25 mcg/kg/min (01/15/24 1347)    PRN Medications: dextrose , morphine  injection, ondansetron  (ZOFRAN ) IV, mouth rinse, oxyCODONE , sodium chloride  flush, traMADol   Plan/Discussion:    1. Chronic systolic heart failure: - combined ischemic and nonischemic: -Mentions of compaction CM from [facility] cards notes. PVCs may also contribute, 25.3% PVCs with NSVT on Zio monitor in 5/25 - RHC 10/25 with low filling pressures and reduced CI (1.9 TD, 2.2 Fick). - NYHA class IV symptoms on admission.  - S/P HM3 LVAD Implant - intra-op TEE with normal RV function (per anesthesia report).  - Continues on 0.25 milrinone  + iNO at 50 ppm. CI 2.4 and co-ox 61%. Wean iNO then milrinone .  - CVP down to 6. Continue lasix  gtt at 10/hr and give 500 mg diamox BID. Stop diuretics this afternoon.  - Multiple PI events on speed drops. Dr.  Stoner decreased VAD speed to 5400 rpm. Plan ramp echo 11/5. [place] out this afternoon - Continue warfarin. INR 1.4.  - LDH elevated 280s-290s but stable    2 CAD:   - LHC 5/[facility] at [facility]: severe multivessel CAD including 80% mid RCA, 100% RPDA, 100% mid LCx, 80% OM1, 80% mid-distal LAD.  - LHC 9/25: occluded PDA and mid AV LCx, 90% ramus, severe diffuse disease in right PLV, nonobstructive LAD disease. With markedly low EF and without severe LAD disease, CABG nor a durable option.  - No ASA  given anticoagulation use and stable CAD.  - Continue Zetia .   3. PVCs:  - Zio 5/25 with 25.3% (prev did not tolerate amiodarone  well, on mexiletine) - increased burden post-op, now improving - Continue IV amiodarone  until off inotrope support - Continue mexilitine 150 mg every 8 hours.    Mobilize today.    CRITICAL CARE Performed by: COLLETTA SHAVER N   Total critical care time: 20 minutes  Critical care time was exclusive of separately billable procedures and treating other patients.  Critical care was necessary to treat or prevent imminent or life-threatening deterioration.  Critical care was time spent personally by me on the following activities: development of treatment plan with patient and/or surrogate as well as nursing, discussions with consultants, evaluation of patient's response to treatment, examination of patient, obtaining history from patient or surrogate, ordering and performing treatments and interventions, ordering and review of laboratory studies, ordering and review of radiographic studies, pulse oximetry and re-evaluation of patient's condition.    Length of Stay: 10

## 2024-01-16 ENCOUNTER — Inpatient Hospital Stay (HOSPITAL_COMMUNITY)

## 2024-01-16 DIAGNOSIS — Z95811 Presence of heart assist device: Secondary | ICD-10-CM | POA: Diagnosis not present

## 2024-01-16 DIAGNOSIS — I5022 Chronic systolic (congestive) heart failure: Secondary | ICD-10-CM | POA: Diagnosis not present

## 2024-01-16 DIAGNOSIS — I5023 Acute on chronic systolic (congestive) heart failure: Secondary | ICD-10-CM | POA: Diagnosis not present

## 2024-01-16 LAB — BASIC METABOLIC PANEL WITH GFR
Anion gap: 15 (ref 5–15)
Anion gap: 14 (ref 5–15)
BUN: 22 mg/dL — ABNORMAL HIGH (ref 6–20)
BUN: 22 mg/dL — ABNORMAL HIGH (ref 6–20)
CO2: 29 mmol/L (ref 22–32)
CO2: 29 mmol/L (ref 22–32)
Calcium: 8.1 mg/dL — ABNORMAL LOW (ref 8.9–10.3)
Calcium: 8.7 mg/dL — ABNORMAL LOW (ref 8.9–10.3)
Chloride: 82 mmol/L — ABNORMAL LOW (ref 98–111)
Chloride: 85 mmol/L — ABNORMAL LOW (ref 98–111)
Creatinine, Ser: 1.28 mg/dL — ABNORMAL HIGH (ref 0.61–1.24)
Creatinine, Ser: 1.26 mg/dL — ABNORMAL HIGH (ref 0.61–1.24)
GFR, Estimated: >60 mL/min (ref >60)
GFR, Estimated: >60 mL/min (ref >60)
Glucose, Bld: 252 mg/dL — ABNORMAL HIGH (ref 70–99)
Glucose, Bld: 103 mg/dL — ABNORMAL HIGH (ref 70–99)
Potassium: 2.9 mmol/L — ABNORMAL LOW (ref 3.5–5.1)
Potassium: 3.4 mmol/L — ABNORMAL LOW (ref 3.5–5.1)
Sodium: 126 mmol/L — ABNORMAL LOW (ref 135–145)
Sodium: 128 mmol/L — ABNORMAL LOW (ref 135–145)

## 2024-01-16 LAB — COOXEMETRY PANEL
Carboxyhemoglobin: 3.2 % — ABNORMAL HIGH (ref 0.5–1.5)
Methemoglobin: <0.7 % (ref 0.0–1.5)
O2 Saturation: 72 %
Total hemoglobin: 11.1 g/dL — ABNORMAL LOW (ref 12.0–16.0)

## 2024-01-16 LAB — CBC WITH DIFFERENTIAL/PLATELET
Abs Immature Granulocytes: 0.21 K/uL — ABNORMAL HIGH (ref 0.00–0.07)
Basophils Absolute: 0 K/uL (ref 0.0–0.1)
Basophils Relative: 0 %
Eosinophils Absolute: 0.2 K/uL (ref 0.0–0.5)
Eosinophils Relative: 2 %
HCT: 32.7 % — ABNORMAL LOW (ref 39.0–52.0)
Hemoglobin: 10.7 g/dL — ABNORMAL LOW (ref 13.0–17.0)
Immature Granulocytes: 2 %
Lymphocytes Relative: 12 %
Lymphs Abs: 1.4 K/uL (ref 0.7–4.0)
MCH: 25.9 pg — ABNORMAL LOW (ref 26.0–34.0)
MCHC: 32.7 g/dL (ref 30.0–36.0)
MCV: 79.2 fL — ABNORMAL LOW (ref 80.0–100.0)
Monocytes Absolute: 2.3 K/uL — ABNORMAL HIGH (ref 0.1–1.0)
Monocytes Relative: 19 %
Neutro Abs: 8 K/uL — ABNORMAL HIGH (ref 1.7–7.7)
Neutrophils Relative %: 65 %
Platelets: 170 K/uL (ref 150–400)
RBC: 4.13 MIL/uL — ABNORMAL LOW (ref 4.22–5.81)
RDW: 20.9 % — ABNORMAL HIGH (ref 11.5–15.5)
WBC: 12.2 K/uL — ABNORMAL HIGH (ref 4.0–10.5)
nRBC: 0 % (ref 0.0–0.2)

## 2024-01-16 LAB — MAGNESIUM: Magnesium: 2.2 mg/dL (ref 1.7–2.4)

## 2024-01-16 LAB — ECHOCARDIOGRAM LIMITED
Height: 75 in
Weight: 5068.8 [oz_av]

## 2024-01-16 LAB — GLUCOSE, CAPILLARY
Glucose-Capillary: 110 mg/dL — ABNORMAL HIGH (ref 70–99)
Glucose-Capillary: 157 mg/dL — ABNORMAL HIGH (ref 70–99)
Glucose-Capillary: 209 mg/dL — ABNORMAL HIGH (ref 70–99)
Glucose-Capillary: 79 mg/dL (ref 70–99)

## 2024-01-16 LAB — PROTIME-INR
INR: 1.5 — ABNORMAL HIGH (ref 0.8–1.2)
Prothrombin Time: 18.5 s — ABNORMAL HIGH (ref 11.4–15.2)

## 2024-01-16 LAB — PHOSPHORUS: Phosphorus: 3.9 mg/dL (ref 2.5–4.6)

## 2024-01-16 LAB — LACTATE DEHYDROGENASE: LDH: 277 U/L — ABNORMAL HIGH (ref 98–192)

## 2024-01-16 MED ORDER — POTASSIUM CHLORIDE CRYS ER 20 MEQ PO TBCR
40.0000 meq | EXTENDED_RELEASE_TABLET | ORAL | Status: AC
  Administered 2024-01-16 (×2): 40 meq via ORAL
  Filled 2024-01-16 (×2): qty 2

## 2024-01-16 MED ORDER — WARFARIN SODIUM 5 MG PO TABS
7.5000 mg | ORAL_TABLET | Freq: Once | ORAL | Status: AC
  Administered 2024-01-16: 7.5 mg via ORAL
  Filled 2024-01-16: qty 1

## 2024-01-16 MED ORDER — WARFARIN - PHARMACIST DOSING INPATIENT: Freq: Every day | Status: DC

## 2024-01-16 MED ORDER — GABAPENTIN 400 MG PO CAPS
400.0000 mg | ORAL_CAPSULE | Freq: Every day | ORAL | Status: DC
  Administered 2024-01-17 – 2024-01-18 (×2): 400 mg via ORAL
  Filled 2024-01-16 (×2): qty 1

## 2024-01-16 MED ORDER — POTASSIUM CHLORIDE 10 MEQ/50ML IV SOLN
10.0000 meq | INTRAVENOUS | Status: AC
  Administered 2024-01-16 (×2): 10 meq via INTRAVENOUS
  Filled 2024-01-16 (×2): qty 50

## 2024-01-16 MED ORDER — WARFARIN - PHYSICIAN DOSING INPATIENT: Freq: Every day | Status: DC

## 2024-01-16 MED ORDER — FUROSEMIDE 10 MG/ML IJ SOLN
80.0000 mg | Freq: Once | INTRAMUSCULAR | Status: AC
  Administered 2024-01-16: 80 mg via INTRAVENOUS
  Filled 2024-01-16: qty 8

## 2024-01-16 NOTE — TOC Initial Note (Signed)
 Transition of Care [facility]) - Initial/Assessment Note    Patient Details  Name: Philip Monroe MRN: [id number] Date of Birth: 1975/05/02  Transition of Care [facility]) CM/SW Contact:    Justina Delcia Czar, RN Phone Number: [phone] 01/16/2024, 6:12 PM  Clinical Narrative:                 Spoke to pt at bedside. States he lives in home with mother and sister. States he wants to consider IP rehab if needed. Wants to see how he progress.   Chart reviewed for discharge readiness, patient not medically stable for d/c. Inpatient CM/CSW will continue to monitor pt's advancement through interdisciplinary progression rounds.   If new pt transition needs arise, MD please place a TOC consult.    Expected Discharge Plan: IP Rehab Facility Barriers to Discharge: Continued Medical Work up   Patient Goals and CMS Choice Patient states their goals for this hospitalization and ongoing recovery are:: wants to recover          Expected Discharge Plan and Services   Discharge Planning Services: CM Consult   Living arrangements for the past 2 months: Apartment                                      Prior Living Arrangements/Services Living arrangements for the past 2 months: Apartment Lives with:: Parents, Siblings Patient language and need for interpreter reviewed:: Yes        Need for Family Participation in Patient Care: Yes (Comment) Care giver support system in place?: Yes (comment)   Criminal Activity/Legal Involvement Pertinent to Current Situation/Hospitalization: No - Comment as needed  Activities of Daily Living   ADL Screening (condition at time of admission) Independently performs ADLs?: Yes (appropriate for developmental age) Is the patient deaf or have difficulty hearing?: No Does the patient have difficulty seeing, even when wearing glasses/contacts?: No Does the patient have difficulty concentrating, remembering, or making decisions?: No  Permission  Sought/Granted Permission sought to share information with : Case Manager, Family Supports, PCP Permission granted to share information with : Yes, Verbal Permission Granted  Share Information with NAME: Raydell Maners  Permission granted to share info w AGENCY: Home Health, IP rehab, DME, PCP  Permission granted to share info w Relationship: sister  Permission granted to share info w Contact Information: [phone]  Emotional Assessment Appearance:: Appears stated age Attitude/Demeanor/Rapport: Engaged Affect (typically observed): Accepting Orientation: : Oriented to Self, Oriented to Place, Oriented to  Time, Oriented to Situation   Psych Involvement: No (comment)  Admission diagnosis:  CHF (congestive heart failure) (HCC) [I50.9] Patient Active Problem List   Diagnosis Date Noted   Acute respiratory failure with hypoxia (HCC) 01/13/2024   CHF (congestive heart failure) (HCC) 01/05/2024   Arthralgia of right knee 12/03/2023   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   Acute exacerbation of CHF (congestive heart failure) (HCC) 11/24/2023   Effusion of right knee 09/25/2023   Ischemic cardiomyopathy 05/21/2023   Demand ischemia (HCC) 05/20/2023   Elevated troponin 05/20/2023   CKD (chronic kidney disease), stage II    Hyperlipidemia LDL goal <70    Morbid obesity (HCC)    PVC's (premature ventricular contractions)    NSTEMI (non-ST elevated myocardial infarction) (HCC) 05/19/2023   Acute on chronic combined systolic and diastolic CHF (congestive heart failure) (HCC) 05/19/2023   Tobacco use disorder 07/19/2022   Coronary artery disease involving  native coronary artery of native heart without angina pectoris 09/10/2018   Non-ischemic cardiomyopathy (HCC) 08/23/2017   Chronic systolic heart failure (HCC) 04/20/2015   Hypertension 04/20/2015   Alcohol use 04/20/2015   Acute on chronic congestive heart failure (HCC) 03/31/2015   PCP:  Freddrick No Pharmacy:   [facility] [address]), [place] - [address] [place]) [place] [address] Phone: [phone] Fax: [phone]     Social Drivers of Health (SDOH) Social History: SDOH Screenings   Food Insecurity: No Food Insecurity (01/06/2024)  Housing: High Risk (01/07/2024)  Transportation Needs: Unmet Transportation Needs (01/07/2024)  Utilities: Not At Risk (01/07/2024)  Financial Resource Strain: Medium Risk (12/13/2023)  Tobacco Use: Medium Risk (01/11/2024)   SDOH Interventions:     Readmission Risk Interventions     No data to display

## 2024-01-16 NOTE — Progress Notes (Signed)
 5 Days Post-Op Procedure(s) (LRB): INSERTION OF IMPLANTABLE LEFT VENTRICULAR ASSIST DEVICE USING HEARTMATE 3 (N/A) ECHOCARDIOGRAM, TRANSESOPHAGEAL, INTRAOPERATIVE (N/A) Subjective: Looks good this morning, pain control improved Walked the halls yesterday. Milrinone  down to 0.25, lasix  gtt off today Had BM  yesterday Prevena removed, incision looks good  Objective: Vital signs in last 24 hours: BP 103/85   Pulse 79   Temp (!) 97.3 F (36.3 C) (Axillary)   Resp (!) 22   Ht 6' 3 (1.905 m)   Wt (!) 143.7 kg   SpO2 96%   BMI 39.60 kg/m  Filed Weights   01/14/24 0500 01/15/24 0500 01/16/24 0500  Weight: (!) 152 kg (!) 147 kg (!) 143.7 kg    Hemodynamic parameters for last 24 hours: CVP:  [4 mmHg] 4 mmHg  Intake/Output from previous day: 11/03 0701 - 11/04 0700 In: 1627.5 [P.O.:750; I.V.:777.6; IV Piggyback:99.9] Out: 6980 [Urine:6970; Chest Tube:10] Intake/Output this shift: Total I/O In: 38.2 [I.V.:27.8; IV Piggyback:10.4] Out: 825 [Urine:825]  Physical Exam: General - Resting comfortably in bed CV - RRR Resp - Unlabored on [place] Abd - Soft, ND/NT Ext - Moderate lower extremity edema, improved upper extremity edema  Lab Results:    Latest Ref Rng & Units 01/16/2024    4:46 AM 01/15/2024    5:58 AM 01/15/2024    5:29 AM  CBC  WBC 4.0 - 10.5 K/uL 12.2   14.0   Hemoglobin 13.0 - 17.0 g/dL 89.2  87.3  88.7   Hematocrit 39.0 - 52.0 % 32.7  37.0  33.7   Platelets 150 - 400 K/uL 170   161       Latest Ref Rng & Units 01/16/2024    5:53 AM 01/15/2024    5:58 AM 01/15/2024    5:29 AM  CMP  Glucose 70 - 99 mg/dL 747   885   BUN 6 - 20 mg/dL 22   18   Creatinine 9.38 - 1.24 mg/dL 8.71   8.80   Sodium 864 - 145 mmol/L 126  126  127   Potassium 3.5 - 5.1 mmol/L 2.9  3.2  3.2   Chloride 98 - 111 mmol/L 82   84   CO2 22 - 32 mmol/L 29   29   Calcium 8.9 - 10.3 mg/dL 8.1   8.7     CXR: Stable, no large effusion or pneumothorax  Assessment/Plan: S/P Procedure(s)  (LRB): INSERTION OF IMPLANTABLE LEFT VENTRICULAR ASSIST DEVICE USING HEARTMATE 3 (N/A) ECHOCARDIOGRAM, TRANSESOPHAGEAL, INTRAOPERATIVE (N/A) POD 5 s/p HM 3 implantation NEURO- intact  Pain control PRN  Continue to ambulate daily CV- in SR around 80 bpm             Pacing wires cut this morning  Continue to wean milrinone  per HF team RESP- Continued improved lung aeration             Continue IS, pulm hygiene, ambulation  Continue Chest tubes RENAL- creatinine and lytes Ok  Weight down 8 lbs from yesterday             Continue diuresis per HF team  Foley will stay for today, plan to remove tomorrow GI- tolerating regular diet  BM: BM 11/3 Endo- BG well controlled Continue ISS ID- NAI DVT ppx - SCD + lovenox  while awaiting therapeutic INR -7.5 mg coumadin tonight  Dispo: ICU   LOS: 11 days    Con Philip Monroe 01/16/2024

## 2024-01-16 NOTE — Progress Notes (Signed)
 LVAD Coordinator Rounding Note:  Admitted 12/25/23 due to CHF and planned teeth extractions prior to VAD implant.   HM3 LVAD implanted on 01/11/24 by BS under DT criteria.  Pt lying in bed on my arrival. States he is feeling a little better everyday. Ramp echo performed this morning. See separate note for details. Speed increase to 5600 per Dr. Zenaida.  NO weaned off yesterday. Remaining CT and pacing wires pulled this morning.   Vital signs: Temp:98.4 HR: 80 Doppler Pressure: 87 Automatic BP: 114/90 (97) O2 Sat: 92% 3L  Wt: 319>349.2>334.2>335.1>324>316.8 lbs    LVAD interrogation reveals:   Speed: 5400 Flow: 4.6 Power:  4.1w PI:3.5  Alarms: none Events:  100+ Hematocrit: 33 Fixed speed: 5500 Low speed limit: 5200  Drive Line: Existing VAD CDI. Anchor secure. Advance dressing changes to MWF. Next dressing change tomorrow 01/17/24 by VAD coordinator or nurse champion only.    Labs:  LDH trend: 274>292>277  INR trend: 1.2>1.4>1.5  Anticoagulation Plan: -INR Goal: 2-2.5 -ASA Dose: none  Blood Products:  - <intra-op/post-op> 01/11/24 - 4 FFP - 625 cc of cell saver  Device: -n/a -Therapies:  Arrythmias: frequent  PVCs in OR  Respiratory:  Extubated 10/31  Renal:  -CRT: 1.35>1.09>1.28  Gtts: Amio 30 mg/hr Milrinone  0.25 mcg/kg/min  Adverse Events on VAD:  Patient Education: Education provided on daily dressing changes and importance on compliance and sterile technique.  Plan/Recommendations:  1. Please page VAD coordinator for any equipment issues or VAD alarms as well as for any procedures on VAD pts. 2. Daily driveline dressing changes by VAD coordinator or nurse champion  Schuyler Lunger RN, BSN VAD Coordinator 24/7 Pager [phone]

## 2024-01-16 NOTE — Progress Notes (Addendum)
 NAME:  Philip Monroe, MRN:  [id number], DOB:  Sep 08, 1975, LOS: 11 ADMISSION DATE:  01/05/2024, CONSULTATION DATE: 10/30  REFERRING MD:  MD Su CHIEF COMPLAINT:  LVAD  History of Present Illness:  Patient is a 48 year old male with significant past medical history of chronic HFrEF < 20%, CAD multivessel (recent [facility] 5/24), followed in past by [facility] Cards for iCM/noncompaction CM, pulmonary HTN, HTN, CKD stage III, diabetes type 2, HLD, hx of ETOH use, multiple recent admission of CHF exacerbations- most recent in 9/25 who was admitted on 01/05/24 for HF optimization, and also teeth extraction x 4 (occurred on 10/27) for LVAD surgery on 01/11/24 by MD Su with TCTS. PCCM consulted to assist with post-op vent management as well as critical care needs during ICU stay.    Pertinent  Medical History   Past Medical History:  Diagnosis Date   Aortic root dilatation    a. 07/2022 Echo: Ao root 4.4cm, Asc Ao 4.0cm.   Asthma    as a child, no problems as an adult, rarely uses inhaler   CAD (coronary artery disease)    a. 08/2017 Cath: LM nl, LAD 51m/d, D1 70ost, LCX 118m, OM1 90 inf branch, 60 sup branch, OM2 small, RCA 30 diffuse, RPDA 100 w/ L->R collats; b. 07/2022 Echo: LM nl, LAD 37m, D1 80, LCX 199m, OM1 80, RCA 22m, RPDA 100-->No targets for PCI/CABG-->Med rx.   CHF (congestive heart failure) (HCC)    takes lasix    Chronic HFrEF (heart failure with reduced ejection fraction) (HCC)    a. 03/2015 Echo: EF 35-40%; b. 08/2017 Echo: EF 15%; c. 08/2017 cMRI: EF 35%; d. 06/2018 Echo: EF 10-15%; e. 07/2022 Echo: EF 15-20%, grIII DD, mild MR, sev dil LA, mod-sev PH, Ao root 4.4cm, Asc Ao 4.0cm; f. 07/2022 RHC: RA 4, RV 38/3, PA 43/18 (26), PCWP 11. CO/CI 5.3/1.8.   CKD (chronic kidney disease), stage II    Diabetes mellitus without complication (HCC)    type 2   Dyspnea    with exertion   Gout    Hyperlipidemia LDL goal <70    Hypertension    Ischemic cardiomyopathy    a. 03/2015 Echo: EF 35-40%; b. 08/2017  Echo: EF 15%; c. 08/2017 cMRI: EF 35%; d. 06/2018 Echo: EF 10-15%; e. 07/2022 Echo: EF 15-20%, grIII DD.   IVCD (intraventricular conduction defect)    Left ventricular noncompaction [facility])    Morbid obesity (HCC)    NSVT (nonsustained ventricular tachycardia) (HCC)    a. 09/2022 Event monitor: predominantly sinus rhythm, avg HR 81 (46-167). 17 runs NSVT (fastest/longest 13 beats x 167). Rare PACs, 17.5% PVC burden.   PVC's (premature ventricular contractions)    a. 09/2022 Event Monitor: 17.5% PVC burden.   Statin intolerance    Tobacco abuse      Significant Hospital Events: Including procedures, antibiotic start and stop dates in addition to other pertinent events   10/24 Admit for HF optimization for LVAD surgery on 10/30 10/27 Teeth Extraction x 4 by oral surgery  10/30 HM3 LVAD placement, PCCM consult for post-op management  11/3 remains on milrinone  0.25, no pressor requirement 11/4 off ino, ambulated with Svo2 drop  Interim History / Subjective:  Drop in SVO2 with ambulation this morning Otherwise pain is better controlled and had a BM Appetite still poor  Off lasix  gtt Minimal CT output  UOP 6.7L yesterday, CVP low around 4  Objective    Blood pressure (!) 108/90, pulse 78, temperature 98.4 F (  36.9 C), temperature source Axillary, resp. rate 19, height 6' 3 (1.905 m), weight (!) 143.7 kg, SpO2 94%. PAP: (37)/(21) 37/21 CVP:  [4 mmHg-6 mmHg] 4 mmHg PCWP:  [22 mmHg] 22 mmHg CO:  [5.4 L/min] 5.4 L/min CI:  [2.01 L/min/m2] 2.01 L/min/m2      Intake/Output Summary (Last 24 hours) at 01/16/2024 0904 Last data filed at 01/16/2024 0800 Gross per 24 hour  Intake 1400.6 ml  Output 6230 ml  Net -4829.4 ml   Filed Weights   01/14/24 0500 01/15/24 0500 01/16/24 0500  Weight: (!) 152 kg (!) 147 kg (!) 143.7 kg    General:  well nourished M sitting up in bed in NAD  HEENT: MM pink/moist Neuro: alert and oriented and moving all extremities  CV: s1s2, paced, LVAD hum,  dressings clean and dry  PULM:  clear bilaterally on [place] GI: soft, mildly distended without TTP Extremities: warm/dry,  1+ edema   Labs and imaging reviewed    Resolved problem list   Assessment and Plan   Acute hypoxic respiratory failure postoperatively -improving - Extubated 10/31 -weaned off ino today - Currently on nasal cannula titrate down as able  -encourage IS and mobilization   Chronic systolic HF s/p LVAD HM III  mvCAD Mild pHTN PVCs with prior poor amio tolerance  Hx HTN  Hx HLD  Hypervolemic hyponatremia -post op, surgical care per CVTS -plan to remove CT today -complete ppx abx  -adv HF following, recs appreciated -off lasix  gtt -LVAD Interrogation shows speed: 5500 Flow: 4.8 PI: 3.7 Power: 4.2. -Numerous PI events with speed drops, plan for Ramp study per HF team -follow coox ldh -Continue milrinone  and  amiodarone   -SVO2 drop with ambulation, plan for  - On mexitil   -Blood sugars better controlled on basal bolus insulin  regimen. -continue Coumadin, management per pharmacy -statin, no ASA w AC above  -pain control improved slightly with Neurontin 400mg  bid and oxycodone  10-15mg  prn   CKD II Hypokalemia  -follow renal indices UOP  -creatinine stable with good UOP -replete K prn   Expected ABLA, thrombocytopenia  -follow CBC   Morbid obesity -no acute intervention   S/p Teeth Extraction 10/27  -Per oral surgery  Gout  -resume home meds when appropriate   Labs   CBC: Recent Labs  Lab 01/12/24 0500 01/12/24 0558 01/12/24 1613 01/12/24 1614 01/13/24 0357 01/13/24 0407 01/14/24 0502 01/14/24 0641 01/15/24 0529 01/15/24 0558 01/16/24 0446  WBC 14.4*  --  15.3*  --  16.9*  --  16.7*  --  14.0*  --  12.2*  NEUTROABS 10.9*  --   --   --  12.2*  --  12.0*  --  9.4*  --  8.0*  HGB 10.9*   < > 10.4*   < > 10.7*   < > 11.1* 12.2* 11.2* 12.6* 10.7*  HCT 33.0*   < > 32.6*   < > 33.8*   < > 34.5* 36.0* 33.7* 37.0* 32.7*  MCV 79.3*  --  80.5   --  81.3  --  80.2  --  78.0*  --  79.2*  PLT 123*  --  110*  --  128*  --  130*  --  161  --  170   < > = values in this interval not displayed.    Basic Metabolic Panel: Recent Labs  Lab 01/12/24 0500 01/12/24 9441 01/12/24 1613 01/12/24 1614 01/13/24 0357 01/13/24 0407 01/13/24 1655 01/14/24 0502 01/14/24 9358 01/15/24 0529 01/15/24 9441  01/16/24 0446 01/16/24 0553  NA 131*   < > 131*   < > 127*   < > 126* 126* 126* 127* 126*  --  126*  K 5.0   < > 4.4   < > 4.7   < > 4.5 3.8 3.7 3.2* 3.2*  --  2.9*  CL 98  --  98  --  95*  --  93* 89*  --  84*  --   --  82*  CO2 23  --  24  --  23  --  21* 26  --  29  --   --  29  GLUCOSE 121*  --  137*  --  126*  --  124* 127*  --  114*  --   --  252*  BUN 15  --  13  --  11  --  11 11  --  18  --   --  22*  CREATININE 1.35*  --  1.36*  --  1.23  --  1.03 1.07  --  1.19  --   --  1.28*  CALCIUM 8.3*  --  8.4*  --  8.4*  --  8.0* 8.6*  --  8.7*  --   --  8.1*  MG 2.4  --  2.2  --  1.9  --   --  1.5*  --  1.7  --  2.2  --   PHOS 4.0  --   --   --  3.5  --   --  3.4  --  3.5  --  3.9  --    < > = values in this interval not displayed.   GFR: Estimated Creatinine Clearance: 109.2 mL/min (A) (by C-G formula based on SCr of 1.28 mg/dL (H)). Recent Labs  Lab 01/13/24 0357 01/14/24 0502 01/15/24 0529 01/16/24 0446  WBC 16.9* 16.7* 14.0* 12.2*    Liver Function Tests: Recent Labs  Lab 01/12/24 0500 01/13/24 0357 01/14/24 0502  AST 68* 60* 37  ALT 25 21 16   ALKPHOS 44 47 54  BILITOT 1.5* 1.8* 1.3*  PROT 5.5* 5.6* 6.2*  ALBUMIN 3.3* 3.0* 3.0*   No results for input(s): LIPASE, AMYLASE in the last 168 hours. No results for input(s): AMMONIA in the last 168 hours.  ABG    Component Value Date/Time   PHART 7.536 (H) 01/15/2024 0558   PCO2ART 39.7 01/15/2024 0558   PO2ART 76 (L) 01/15/2024 0558   HCO3 33.6 (H) 01/15/2024 0558   TCO2 35 (H) 01/15/2024 0558   ACIDBASEDEF 2.0 01/13/2024 0407   O2SAT 72 01/16/2024 0446      Coagulation Profile: Recent Labs  Lab 01/12/24 0500 01/13/24 0357 01/14/24 0502 01/15/24 0529 01/16/24 0446  INR 1.2 1.5* 1.4* 1.4* 1.5*    Cardiac Enzymes: No results for input(s): CKTOTAL, CKMB, CKMBINDEX, TROPONINI in the last 168 hours.  HbA1C: Hgb A1c MFr Bld  Date/Time Value Ref Range Status  11/29/2023 04:45 AM 6.6 (H) 4.8 - 5.6 % Final    Comment:    (NOTE) Diagnosis of Diabetes The following HbA1c ranges recommended by the American Diabetes Association (ADA) may be used as an aid in the diagnosis of diabetes mellitus.  Hemoglobin             Suggested A1C NGSP%              Diagnosis  <5.7  Non Diabetic  5.7-6.4                Pre-Diabetic  >6.4                   Diabetic  <7.0                   Glycemic control for                       adults with diabetes.      CBG: Recent Labs  Lab 01/15/24 0624 01/15/24 1121 01/15/24 1620 01/15/24 2134 01/16/24 0607  GLUCAP 136* 133* 112* 132* 110*   CRITICAL CARE Performed by: Leita SAUNDERS Secundino [place]   Total critical care time: 38 minutes  Critical care time was exclusive of separately billable procedures and treating other patients.  Critical care was necessary to treat or prevent imminent or life-threatening deterioration.  Critical care was time spent personally by me on the following activities: development of treatment plan with patient and/or surrogate as well as nursing, discussions with consultants, evaluation of patient's response to treatment, examination of patient, obtaining history from patient or surrogate, ordering and performing treatments and interventions, ordering and review of laboratory studies, ordering and review of radiographic studies, pulse oximetry and re-evaluation of patient's condition.  Leita SAUNDERS Jered Heiny, PA-C [facility] See Amion for pager If no response to pager , please call [phone] until 7pm After 7:00 pm call Elink   [phone]

## 2024-01-16 NOTE — Progress Notes (Signed)
 Patient ID: Philip Monroe, male   DOB: 1975/06/13, 48 y.o.   MRN: [id number]   Advanced Heart Failure Rounding Note  Chief Complaint: s/p HM3 Implant Subjective:    10/30 S/P HMIII  On 0.25 milrinone  + amiodarone  gtt at 30/hr. iNO off.  Co-ox 72%.   CVP 4. Brisk diuresis yesterday with lasix  gtt. Weight down another 8 lb.  Has been ambulating halls daily. Gets short of breath easily with exertion. Pain left upper abdomen near ribs.  LVAD Interrogation HM III: Speed: 5400 Flow: 4.3 PI: 4.3 Power: 4. Numerous PI events with multiple speed drops.   Objective:    Vital Signs:   Temp:  [97.9 F (36.6 C)-98.8 F (37.1 C)] 98.2 F (36.8 C) (11/04 0356) Pulse Rate:  [41-184] 77 (11/04 0600) Resp:  [13-30] 19 (11/04 0600) BP: (90-121)/(74-103) 100/88 (11/04 0600) SpO2:  [88 %-100 %] 95 % (11/04 0600) Arterial Line BP: (84-98)/(77-87) 92/87 (11/03 1145) Weight:  [143.7 kg] 143.7 kg (11/04 0500) Last BM Date : (S) 01/16/24  Weight change: Filed Weights   01/14/24 0500 01/15/24 0500 01/16/24 0500  Weight: (!) 152 kg (!) 147 kg (!) 143.7 kg   Intake/Output:  Intake/Output Summary (Last 24 hours) at 01/16/2024 0657 Last data filed at 01/16/2024 0600 Gross per 24 hour  Intake 1827.35 ml  Output 6730 ml  Net -4902.65 ml    Physical Exam  Physical Exam: GENERAL: Fatigued appearing. Lying in bed. CARDIAC:  No JVD. Mechanical heart sounds with LVAD hum present.  LUNGS:  Clear to auscultation bilaterally.  ABDOMEN:  Soft, round, nontender, positive bowel sounds x4.     LVAD exit site:   Dressing dry and intact.  No erythema or drainage.  Stabilization device present and accurately applied.  EXTREMITIES:  Warm and dry, no cyanosis, clubbing, rash or edema  NEUROLOGIC:  Alert and oriented x 4.  Gait steady.  No aphasia.  No dysarthria.  Affect pleasant.       Telemetry: SR 80s, PVCs  Labs: Basic Metabolic Panel: Recent Labs  Lab 01/12/24 0500 01/12/24 0558 01/12/24 1613  01/12/24 1614 01/13/24 0357 01/13/24 0407 01/13/24 1655 01/14/24 0502 01/14/24 0641 01/15/24 0529 01/15/24 0558 01/16/24 0446 01/16/24 0553  NA 131*   < > 131*   < > 127*   < > 126* 126* 126* 127* 126*  --  126*  K 5.0   < > 4.4   < > 4.7   < > 4.5 3.8 3.7 3.2* 3.2*  --  2.9*  CL 98  --  98  --  95*  --  93* 89*  --  84*  --   --  82*  CO2 23  --  24  --  23  --  21* 26  --  29  --   --  29  GLUCOSE 121*  --  137*  --  126*  --  124* 127*  --  114*  --   --  252*  BUN 15  --  13  --  11  --  11 11  --  18  --   --  22*  CREATININE 1.35*  --  1.36*  --  1.23  --  1.03 1.07  --  1.19  --   --  1.28*  CALCIUM 8.3*  --  8.4*  --  8.4*  --  8.0* 8.6*  --  8.7*  --   --  8.1*  MG 2.4  --  2.2  --  1.9  --   --  1.5*  --  1.7  --  2.2  --   PHOS 4.0  --   --   --  3.5  --   --  3.4  --  3.5  --  3.9  --    < > = values in this interval not displayed.   Liver Function Tests: Recent Labs  Lab 01/12/24 0500 01/13/24 0357 01/14/24 0502  AST 68* 60* 37  ALT 25 21 16   ALKPHOS 44 47 54  BILITOT 1.5* 1.8* 1.3*  PROT 5.5* 5.6* 6.2*  ALBUMIN 3.3* 3.0* 3.0*   No results for input(s): LIPASE, AMYLASE in the last 168 hours. No results for input(s): AMMONIA in the last 168 hours.  CBC: Recent Labs  Lab 01/12/24 0500 01/12/24 0558 01/12/24 1613 01/12/24 1614 01/13/24 0357 01/13/24 0407 01/14/24 0502 01/14/24 0641 01/15/24 0529 01/15/24 0558 01/16/24 0446  WBC 14.4*  --  15.3*  --  16.9*  --  16.7*  --  14.0*  --  12.2*  NEUTROABS 10.9*  --   --   --  12.2*  --  12.0*  --  9.4*  --  8.0*  HGB 10.9*   < > 10.4*   < > 10.7*   < > 11.1* 12.2* 11.2* 12.6* 10.7*  HCT 33.0*   < > 32.6*   < > 33.8*   < > 34.5* 36.0* 33.7* 37.0* 32.7*  MCV 79.3*  --  80.5  --  81.3  --  80.2  --  78.0*  --  79.2*  PLT 123*  --  110*  --  128*  --  130*  --  161  --  170   < > = values in this interval not displayed.   BNP (last 3 results) Recent Labs    12/25/23 1139 01/05/24 1907  01/12/24 0500  BNP 315.6* 674.8* 623.8*   Medications:    Scheduled Medications:  acetaminophen   1,000 mg Oral Q6H   Or   acetaminophen  (TYLENOL ) oral liquid 160 mg/5 mL  1,000 mg Per Tube Q6H   bisacodyl  10 mg Oral Daily   Or   bisacodyl  10 mg Rectal Daily   Chlorhexidine  Gluconate Cloth  6 each Topical Daily   docusate sodium  200 mg Oral Daily   enoxaparin  (LOVENOX ) injection  40 mg Subcutaneous Daily   ezetimibe   10 mg Oral Daily   feeding supplement  237 mL Oral TID BM   gabapentin  400 mg Oral BID   insulin  aspart  0-24 Units Subcutaneous TID WC   insulin  glargine-yfgn  18 Units Subcutaneous Daily   mexiletine  150 mg Oral Q8H   pantoprazole  40 mg Oral Daily   sodium chloride  flush  10-40 mL Intracatheter Q12H   sodium chloride  flush  3 mL Intravenous Q12H   spironolactone   25 mg Oral Daily   Warfarin - Physician Dosing Inpatient   Does not apply q1600    Infusions:  amiodarone  30 mg/hr (01/16/24 0600)   milrinone  0.25 mcg/kg/min (01/16/24 0600)    PRN Medications: dextrose , morphine  injection, ondansetron  (ZOFRAN ) IV, mouth rinse, oxyCODONE , sodium chloride  flush, sodium chloride  flush, traMADol   Plan/Discussion:    1. Chronic systolic heart failure: - combined ischemic and nonischemic: -Mentions of compaction CM from [facility] cards notes. PVCs may also contribute, 25.3% PVCs with NSVT on Zio monitor in 5/25 - RHC 10/25 with low filling pressures and reduced  CI (1.9 TD, 2.2 Fick). - NYHA class IV symptoms on admission.  - S/P HM3 LVAD Implant - intra-op TEE with normal RV function (per anesthesia report).  - Continues on 0.25 milrinone . iNO now off. CO-OX 72%. Ramp echo today before starting milrinone  wean. - CVP 4. Now below pre-op weight. Stop lasix  gtt. Plan to start bolus dosing this afternoon. Aggressively supp K. BMET this afternoon - Continue spiro 25 mg daily - Eventual SGLT2i once foley out, hopefully tomorrow - Continue warfarin. INR 1.5. - LDH  elevated 280s-290s but stable    2 CAD:   - LHC 5/24 at [facility]: severe multivessel CAD including 80% mid RCA, 100% RPDA, 100% mid LCx, 80% OM1, 80% mid-distal LAD.  - LHC 9/25: occluded PDA and mid AV LCx, 90% ramus, severe diffuse disease in right PLV, nonobstructive LAD disease. With markedly low EF and without severe LAD disease, CABG nor a durable option.  - No ASA given anticoagulation use and stable CAD.  - Continue Zetia .   3. PVCs:  - Zio 5/25 with 25.3% (prev did not tolerate amiodarone  well, on mexiletine) - increased burden post-op, now improving - Continue IV amiodarone  until off inotrope support - Continue mexilitine 150 mg every 8 hours.    Mobilize today. Aggressive PT/OT. Eventually hope to get him to CIR.   CRITICAL CARE Performed by: COLLETTA SHAVER N   Total critical care time: 20 minutes  Critical care time was exclusive of separately billable procedures and treating other patients.  Critical care was necessary to treat or prevent imminent or life-threatening deterioration.  Critical care was time spent personally by me on the following activities: development of treatment plan with patient and/or surrogate as well as nursing, discussions with consultants, evaluation of patient's response to treatment, examination of patient, obtaining history from patient or surrogate, ordering and performing treatments and interventions, ordering and review of laboratory studies, ordering and review of radiographic studies, pulse oximetry and re-evaluation of patient's condition.    Length of Stay: 11

## 2024-01-16 NOTE — Progress Notes (Signed)
 Physical Therapy Treatment Patient Details Name: Philip Monroe MRN: [id number] DOB: 05-16-1975 Today's Date: 01/16/2024   History of Present Illness Pt is a 48 y.o. male who presented 10/24 for right heart cath with plan for LVAD implantation. S/p dental teeth extraction 10/27. S/p Implantation of HeartMate 3 Left Ventricular Assist Device 10/30 PMH: aortic root dilatation, asthma, CAD, CHF, CKD stage II, gout, DM, HLD, HTN, IVCD, morbid obesity, left ventricular noncompation, NSVT, tobacco abuse.    PT Comments  Making good progress with physical therapy, Ambulated >100 feet today with CGA on Eva walker, +2 for safety and equipment management. CGA for bed mobility (HOB elevated,) and Min assist to stand from elevated bed surface due to height. Reviewed precautions; able to transition on and off of LVAD batteries with supervision, no physical intervention. Reviewed harness use. Recalls 2/3 items that go in LVAD kit. Patient will continue to benefit from skilled physical therapy services to further improve independence with functional mobility.     If plan is discharge home, recommend the following: Assistance with cooking/housework;Assist for transportation;Help with stairs or ramp for entrance;A lot of help with walking and/or transfers;A lot of help with bathing/dressing/bathroom   Can travel by private vehicle        Equipment Recommendations  BSC/3in1;Rollator (4 wheels) (Pending progress)    Recommendations for Other Services Rehab consult     Precautions / Restrictions Precautions Precautions: Sternal;Fall Precaution Booklet Issued: Yes (comment) Recall of Precautions/Restrictions: Impaired Precaution/Restrictions Comments: LVAD Restrictions Weight Bearing Restrictions Per Provider Order: Yes RUE Weight Bearing Per Provider Order: Non weight bearing LUE Weight Bearing Per Provider Order: Non weight bearing Other Position/Activity Restrictions: Cardiac Sternal Precautions      Mobility  Bed Mobility Overal bed mobility: Needs Assistance Bed Mobility: Supine to Sit     Supine to sit: HOB elevated, Contact guard     General bed mobility comments: CGA for safety with cues for technique. Able to bring LEs over edge of bed and use abdominal muscles to rise without physical assist, HOB elevated.    Transfers Overall transfer level: Needs assistance Equipment used: Ambulation equipment used (eva walker) Transfers: Sit to/from Stand Sit to Stand: Min assist, From elevated surface           General transfer comment: Min assist for boost to stand from elevated bed surface, rocks for momentum. Cues for technique, holding heart pillow for comfort and to reinforce sternal precautions. Safely transitions to eva walker    Ambulation/Gait Ambulation/Gait assistance: Contact guard assist, +2 safety/equipment Gait Distance (Feet): 120 Feet Assistive device: Elyn Finder Gait Pattern/deviations: Step-through pattern, Decreased stride length, Wide base of support, Drifts right/left, Trunk flexed Gait velocity: dec Gait velocity interpretation: <1.31 ft/sec, indicative of household ambulator   General Gait Details: CGA for safety +2 for equipment management. Minor instability, frequent cues for upright posture and proximity to device for maximum support while maintaining sternal precautions. Required a couple of standing rest breaks to complete distance, initially on 4L, RN increased further - unable to obtain consistent SpO2. mild/moderate dyspnea.   Stairs             Wheelchair Mobility     Tilt Bed    Modified Rankin (Stroke Patients Only)       Balance Overall balance assessment: Needs assistance Sitting-balance support: No upper extremity supported, Feet supported Sitting balance-Leahy Scale: Fair     Standing balance support: No upper extremity supported Standing balance-Leahy Scale: Fair Standing balance comment: CGA standing statically  Communication Communication Communication: No apparent difficulties  Cognition Arousal: Alert Behavior During Therapy: WFL for tasks assessed/performed   PT - Cognitive impairments: No apparent impairments                         Following commands: Intact      Cueing Cueing Techniques: Verbal cues  Exercises General Exercises - Lower Extremity Ankle Circles/Pumps: AROM, Both, 10 reps, Supine Quad Sets: Strengthening, Both, 10 reps, Supine Gluteal Sets: Strengthening, Both, 10 reps, Seated    General Comments General comments (skin integrity, edema, etc.): HR 81, BP 113/82. LVAD 5600 rpm, 4.8 LPM, 4.3 W. Pt able to transition onto and off of batteries with LVAD leads no assistance. Teach back.      Pertinent Vitals/Pain Pain Assessment Pain Assessment: Faces Faces Pain Scale: Hurts even more Pain Location: sternum Pain Descriptors / Indicators: Aching, Operative site guarding Pain Intervention(s): Monitored during session, Repositioned, Limited activity within patient's tolerance    Home Living                          Prior Function            PT Goals (current goals can now be found in the care plan section) Acute Rehab PT Goals Patient Stated Goal: Reduce pain, improve strength PT Goal Formulation: With patient Time For Goal Achievement: 01/27/24 Potential to Achieve Goals: Good Progress towards PT goals: Progressing toward goals    Frequency    Min 3X/week      PT Plan      Co-evaluation              AM-PAC PT 6 Clicks Mobility   Outcome Measure  Help needed turning from your back to your side while in a flat bed without using bedrails?: A Little Help needed moving from lying on your back to sitting on the side of a flat bed without using bedrails?: A Little Help needed moving to and from a bed to a chair (including a wheelchair)?: A Little Help needed standing up from a chair using  your arms (e.g., wheelchair or bedside chair)?: A Little Help needed to walk in hospital room?: A Little Help needed climbing 3-5 steps with a railing? : Total 6 Click Score: 16    End of Session Equipment Utilized During Treatment: Oxygen Activity Tolerance: Patient tolerated treatment well Patient left: with call bell/phone within reach;in chair;with chair alarm set;with nursing/sitter in room Nurse Communication: Mobility status PT Visit Diagnosis: Difficulty in walking, not elsewhere classified (R26.2);Unsteadiness on feet (R26.81);Other abnormalities of gait and mobility (R26.89);Muscle weakness (generalized) (M62.81);Pain Pain - part of body:  (Sternum)     Time: 8663-8573 PT Time Calculation (min) (ACUTE ONLY): 50 min  Charges:    $Gait Training: 8-22 mins $Therapeutic Activity: 8-22 mins $Self Care/Home Management: 8-22 PT General Charges $$ ACUTE PT VISIT: 1 Visit                     Leontine Roads, PT, DPT [facility]  Rehabilitation Services Physical Therapist Office: [phone] Website: [email or web address]    Leontine GORMAN Roads 01/16/2024, 4:06 PM

## 2024-01-16 NOTE — Progress Notes (Signed)
 Speed  Flow  PI  Power  LVIDD  AI  Aortic opening MR  TR  Septum  RV  VTI (>18cm)  5400 4.6 4.4 4.0w 6.1 none 3/5  none trace Pulling right  mild  12.8  5300  4.5 4.2 4.0w 6.1 none 3/5 none trace Pulling right mild 12  5200  4.4 3.9 3.8w 6.1 none 3/5 none trace Pulling right mild 13.4   5100 4.0 5.2 3.5w 6.3 none 3/5 trace trace Pulling right mild 10   5600 5.1 3.7 4.4w 6.6  4/5   Pulling right mild 13.5  5700  5.2 3.4 4.5w   4/5   Pulling in mild 9.95   Doppler MAP:  Auto cuff BP: 114/90 (97)   Ramp ECHO performed at bedside per Dr. Zenaida  At completion of ramp study, patients primary controller programmed:  Fixed speed:5600 Low speed limit:5300   Schuyler Lunger RN, BSN VAD Coordinator 24/7 Pager [phone]

## 2024-01-16 NOTE — Progress Notes (Signed)
 Inpatient Rehab Admissions Coordinator Note:   Per therapy recommendations patient was screened for CIR candidacy by Reche FORBES Lowers, PT. At this time, pt appears to be a potential candidate for CIR. I will place an order for rehab consult for full assessment, per our protocol.  Please contact me any with questions.SABRA Reche Lowers, PT, DPT [phone] 01/16/24 3:03 PM

## 2024-01-17 ENCOUNTER — Other Ambulatory Visit (HOSPITAL_COMMUNITY): Payer: Self-pay

## 2024-01-17 ENCOUNTER — Inpatient Hospital Stay (HOSPITAL_COMMUNITY)

## 2024-01-17 ENCOUNTER — Telehealth (HOSPITAL_COMMUNITY): Payer: Self-pay

## 2024-01-17 DIAGNOSIS — Z95811 Presence of heart assist device: Secondary | ICD-10-CM

## 2024-01-17 DIAGNOSIS — R5381 Other malaise: Secondary | ICD-10-CM

## 2024-01-17 DIAGNOSIS — I5023 Acute on chronic systolic (congestive) heart failure: Secondary | ICD-10-CM | POA: Diagnosis not present

## 2024-01-17 DIAGNOSIS — E871 Hypo-osmolality and hyponatremia: Secondary | ICD-10-CM

## 2024-01-17 DIAGNOSIS — I493 Ventricular premature depolarization: Secondary | ICD-10-CM

## 2024-01-17 LAB — CBC WITH DIFFERENTIAL/PLATELET
Abs Immature Granulocytes: 0.2 K/uL — ABNORMAL HIGH (ref 0.00–0.07)
Basophils Absolute: 0 K/uL (ref 0.0–0.1)
Basophils Relative: 0 %
Eosinophils Absolute: 0.2 K/uL (ref 0.0–0.5)
Eosinophils Relative: 2 %
HCT: 30.9 % — ABNORMAL LOW (ref 39.0–52.0)
Hemoglobin: 10.1 g/dL — ABNORMAL LOW (ref 13.0–17.0)
Immature Granulocytes: 2 %
Lymphocytes Relative: 11 %
Lymphs Abs: 1.5 K/uL (ref 0.7–4.0)
MCH: 26.1 pg (ref 26.0–34.0)
MCHC: 32.7 g/dL (ref 30.0–36.0)
MCV: 79.8 fL — ABNORMAL LOW (ref 80.0–100.0)
Monocytes Absolute: 2.7 K/uL — ABNORMAL HIGH (ref 0.1–1.0)
Monocytes Relative: 20 %
Neutro Abs: 8.8 K/uL — ABNORMAL HIGH (ref 1.7–7.7)
Neutrophils Relative %: 65 %
Platelets: 185 K/uL (ref 150–400)
RBC: 3.87 MIL/uL — ABNORMAL LOW (ref 4.22–5.81)
RDW: 20.7 % — ABNORMAL HIGH (ref 11.5–15.5)
WBC: 13.4 K/uL — ABNORMAL HIGH (ref 4.0–10.5)
nRBC: 0 % (ref 0.0–0.2)

## 2024-01-17 LAB — BASIC METABOLIC PANEL WITH GFR
Anion gap: 14 (ref 5–15)
Anion gap: 16 — ABNORMAL HIGH (ref 5–15)
BUN: 22 mg/dL — ABNORMAL HIGH (ref 6–20)
BUN: 19 mg/dL (ref 6–20)
CO2: 26 mmol/L (ref 22–32)
CO2: 22 mmol/L (ref 22–32)
Calcium: 8.2 mg/dL — ABNORMAL LOW (ref 8.9–10.3)
Calcium: 8.4 mg/dL — ABNORMAL LOW (ref 8.9–10.3)
Chloride: 85 mmol/L — ABNORMAL LOW (ref 98–111)
Chloride: 87 mmol/L — ABNORMAL LOW (ref 98–111)
Creatinine, Ser: 1.14 mg/dL (ref 0.61–1.24)
Creatinine, Ser: 0.98 mg/dL (ref 0.61–1.24)
GFR, Estimated: >60 mL/min (ref >60)
GFR, Estimated: >60 mL/min (ref >60)
Glucose, Bld: 268 mg/dL — ABNORMAL HIGH (ref 70–99)
Glucose, Bld: 194 mg/dL — ABNORMAL HIGH (ref 70–99)
Potassium: 3.2 mmol/L — ABNORMAL LOW (ref 3.5–5.1)
Potassium: 3.7 mmol/L (ref 3.5–5.1)
Sodium: 125 mmol/L — ABNORMAL LOW (ref 135–145)
Sodium: 125 mmol/L — ABNORMAL LOW (ref 135–145)

## 2024-01-17 LAB — POCT I-STAT 7, (LYTES, BLD GAS, ICA,H+H)
Acid-Base Excess: 2 mmol/L (ref 0.0–2.0)
Bicarbonate: 25.7 mmol/L (ref 20.0–28.0)
Calcium, Ion: 1.16 mmol/L (ref 1.15–1.40)
HCT: 33 % — ABNORMAL LOW (ref 39.0–52.0)
Hemoglobin: 11.2 g/dL — ABNORMAL LOW (ref 13.0–17.0)
O2 Saturation: 97 %
Patient temperature: 98.1
Potassium: 3.4 mmol/L — ABNORMAL LOW (ref 3.5–5.1)
Sodium: 126 mmol/L — ABNORMAL LOW (ref 135–145)
TCO2: 27 mmol/L (ref 22–32)
pCO2 arterial: 35.8 mmHg (ref 32–48)
pH, Arterial: 7.463 — ABNORMAL HIGH (ref 7.35–7.45)
pO2, Arterial: 81 mmHg — ABNORMAL LOW (ref 83–108)

## 2024-01-17 LAB — COOXEMETRY PANEL
Carboxyhemoglobin: 2.3 % — ABNORMAL HIGH (ref 0.5–1.5)
Methemoglobin: <0.7 % (ref 0.0–1.5)
O2 Saturation: 68.8 %
Total hemoglobin: 10.1 g/dL — ABNORMAL LOW (ref 12.0–16.0)

## 2024-01-17 LAB — GLUCOSE, CAPILLARY
Glucose-Capillary: 109 mg/dL — ABNORMAL HIGH (ref 70–99)
Glucose-Capillary: 127 mg/dL — ABNORMAL HIGH (ref 70–99)
Glucose-Capillary: 98 mg/dL (ref 70–99)
Glucose-Capillary: 134 mg/dL — ABNORMAL HIGH (ref 70–99)

## 2024-01-17 LAB — LACTATE DEHYDROGENASE: LDH: 253 U/L — ABNORMAL HIGH (ref 98–192)

## 2024-01-17 LAB — PHOSPHORUS: Phosphorus: 3 mg/dL (ref 2.5–4.6)

## 2024-01-17 LAB — PROTIME-INR
INR: 1.6 — ABNORMAL HIGH (ref 0.8–1.2)
Prothrombin Time: 19.9 s — ABNORMAL HIGH (ref 11.4–15.2)

## 2024-01-17 LAB — MAGNESIUM: Magnesium: 2 mg/dL (ref 1.7–2.4)

## 2024-01-17 LAB — POTASSIUM: Potassium: 3.9 mmol/L (ref 3.5–5.1)

## 2024-01-17 MED ORDER — LIDOCAINE 5 % EX PTCH
1.0000 | MEDICATED_PATCH | CUTANEOUS | Status: DC
  Administered 2024-01-18 – 2024-01-24 (×7): 1 via TRANSDERMAL
  Filled 2024-01-17 (×7): qty 1

## 2024-01-17 MED ORDER — POTASSIUM CHLORIDE CRYS ER 20 MEQ PO TBCR: 20.0000 meq | EXTENDED_RELEASE_TABLET | ORAL | Status: DC

## 2024-01-17 MED ORDER — GABAPENTIN 100 MG PO CAPS
200.0000 mg | ORAL_CAPSULE | Freq: Every day | ORAL | Status: DC
  Administered 2024-01-17 – 2024-01-18 (×2): 200 mg via ORAL
  Filled 2024-01-17 (×2): qty 2

## 2024-01-17 MED ORDER — SPIRONOLACTONE 25 MG PO TABS
50.0000 mg | ORAL_TABLET | Freq: Every day | ORAL | Status: DC
  Administered 2024-01-17 – 2024-01-21 (×5): 50 mg via ORAL
  Filled 2024-01-17 (×5): qty 2

## 2024-01-17 MED ORDER — EMPAGLIFLOZIN 10 MG PO TABS
10.0000 mg | ORAL_TABLET | Freq: Every day | ORAL | Status: DC
  Administered 2024-01-17 – 2024-01-24 (×8): 10 mg via ORAL
  Filled 2024-01-17 (×8): qty 1

## 2024-01-17 MED ORDER — POTASSIUM CHLORIDE CRYS ER 20 MEQ PO TBCR
40.0000 meq | EXTENDED_RELEASE_TABLET | ORAL | Status: AC
  Administered 2024-01-17 (×2): 40 meq via ORAL
  Filled 2024-01-17 (×2): qty 2

## 2024-01-17 MED ORDER — POTASSIUM CHLORIDE CRYS ER 20 MEQ PO TBCR
20.0000 meq | EXTENDED_RELEASE_TABLET | ORAL | Status: DC
  Administered 2024-01-17: 20 meq via ORAL
  Filled 2024-01-17: qty 1

## 2024-01-17 MED ORDER — WARFARIN SODIUM 5 MG PO TABS
7.5000 mg | ORAL_TABLET | Freq: Once | ORAL | Status: AC
  Administered 2024-01-17: 7.5 mg via ORAL
  Filled 2024-01-17: qty 1

## 2024-01-17 NOTE — Progress Notes (Signed)
 OT Cancellation Note  Patient Details Name: LUKIS BUNT MRN: [id number] DOB: 1975-03-17   Cancelled Treatment:    Reason Eval/Treat Not Completed: Patient declined, no reason specified (just finished bathing, declines OOB mobility at this time)  Ariannie Penaloza K, OTD, OTR/L SecureChat Preferred Acute Rehab [phone]   Laneta MARLA Pereyra 01/17/2024, 4:09 PM

## 2024-01-17 NOTE — Progress Notes (Signed)
 Brief Progress Note  Doing well, making progress every day.  Made 2.5 L of urine yesterday, all hardware is out. INR 1.6 this morning Speed 5600, Flow 4.9L  Exam: - Resting comfortably in bed - HR 80s with PVCs - Breathing unlabored on nasal cannula - Sternotomy incision clean and dry.  Plan: - Milrinone  wean per HF team - Coumadin 7.5 mg tonight  Con Clunes, MD Cardiothoracic Surgery Pager: [phone]

## 2024-01-17 NOTE — Progress Notes (Signed)
 LVAD Coordinator Rounding Note:  Admitted 12/25/23 due to CHF and planned teeth extractions prior to VAD implant.   HM3 LVAD implanted on 01/11/24 by BS under DT criteria.  Pt lying in bed on my arrival. States pain is not well under control at the time. Bedside RN made aware. Lidocaine  patch ordered per AHF team.   PI events significantly reduced from yesterday following increase in speed.  Drive line dressing education to begin this afternoon with daughter.  Vital signs: Temp: HR: 76 Doppler Pressure: 102 Automatic BP: 102/79 (87) O2 Sat: 96% 3L [place] Wt: 319>349.2>334.2>335.1>324>316.8>316.1 lbs    LVAD interrogation reveals:   Speed: 5600 Flow: 5.0 Power:  4.3w PI:4.1 Alarms: none Events: 10 Hematocrit: 33 Fixed speed: 5500 Low speed limit: 5200  Drive Line: Existing VAD CDI. Anchor secure. Advance dressing changes to MWF. Next dressing change tthis afternoon with pt's daugther 01/17/24 by VAD coordinator or nurse champion only.   Labs:  LDH trend: 274>292>277>253  INR trend: 1.2>1.4>1.5>1.6  Anticoagulation Plan: -INR Goal: 2-2.5 -ASA Dose: none  Blood Products:  - <intra-op/post-op> 01/11/24 - 4 FFP - 625 cc of cell saver  Device: -n/a -Therapies:  Arrythmias: frequent  PVCs in OR  Respiratory:  Extubated 10/31  Renal:  -CRT: 1.35>1.09>1.28>1.14  Gtts: Amio 30 mg/hr Milrinone  0.125 mcg/kg/min  Adverse Events on VAD:  Patient Education: Education to begin this afternoon with pt's daughter  Plan/Recommendations:  1. Please page VAD coordinator for any equipment issues or VAD alarms as well as for any procedures on VAD pts. 2. Daily driveline dressing changes by VAD coordinator or nurse champion  Schuyler Lunger RN, BSN VAD Coordinator 24/7 Pager [phone]

## 2024-01-17 NOTE — PMR Pre-admission (Shared)
 PMR Admission Coordinator Pre-Admission Assessment  [name] is an 48 y.o., male MRN: [id number] DOB: [birth year] Height: 6' 3 (190.5 cm) Weight: (!) 143.4 kg (Pt holding LVAD monitor)  Insurance Information HMO: ***    PPO: ***     PCP: ***     IPA: ***     80/20: ***     OTHER: *** PRIMARY:  medicaid [employer]       Policy#: [id number]      Subscriber: patient CM Name: ***      Phone#: ***     Fax#: *** Pre-Cert#: ***      Employer: *** Benefits:  Phone #: [phone]     Name: *** Eustacio. Date: ***     Deduct: ***      Out of Pocket Max: ***      Life Max: *** CIR: ***      SNF: *** Outpatient: ***     Co-Pay: *** Home Health: ***      Co-Pay: *** DME: ***     Co-Pay: *** Providers: in network  SECONDARY:       Policy#:      Phone#:   Artist:       Phone#:   The Data Processing Manager" for patients in Inpatient Rehabilitation Facilities with attached "Privacy Act Statement-Health Care Records" was provided and verbally reviewed with: Patient  Emergency Contact Information Contact Information     Name Relation Home Work Mobile   [place] Sister [phone]     Debra, [place] Daughter   [phone]   Merlin, Golden Daughter   [phone]      Other Contacts   None on File     Current Medical History  Patient Admitting Diagnosis: CHF, LVAD  History of Present Illness:  A 49 y.o. male who presented 01/05/24 for right heart cath with plan for LVAD implantation. S/p dental teeth extraction 01/08/24. S/p Implantation of HeartMate 3 Left Ventricular Assist Device 01/11/24 PMH: aortic root dilatation, asthma, CAD, CHF, CKD stage II, gout, DM, HLD, HTN, IVCD, morbid obesity, left ventricular noncompation, NSVT, tobacco abuse.  PT/OT evaluations completed with recommendations for acute inpatient rehab admission.   Patient's medical record from [facility] has been reviewed by the rehabilitation admission coordinator and  physician.  Past Medical History  Past Medical History:  Diagnosis Date   Aortic root dilatation    a. 07/2022 Echo: Ao root 4.4cm, Asc Ao 4.0cm.   Asthma    as a child, no problems as an adult, rarely uses inhaler   CAD (coronary artery disease)    a. 08/2017 Cath: LM nl, LAD 42m/d, D1 70ost, LCX 179m, OM1 90 inf branch, 60 sup branch, OM2 small, RCA 30 diffuse, RPDA 100 w/ L->R collats; b. 07/2022 Echo: LM nl, LAD 27m, D1 80, LCX 178m, OM1 80, RCA 42m, RPDA 100-->No targets for PCI/CABG-->Med rx.   CHF (congestive heart failure) (HCC)    takes lasix    Chronic HFrEF (heart failure with reduced ejection fraction) (HCC)    a. 03/2015 Echo: EF 35-40%; b. 08/2017 Echo: EF 15%; c. 08/2017 cMRI: EF 35%; d. 06/2018 Echo: EF 10-15%; e. 07/2022 Echo: EF 15-20%, grIII DD, mild MR, sev dil LA, mod-sev PH, Ao root 4.4cm, Asc Ao 4.0cm; f. 07/2022 RHC: RA 4, RV 38/3, PA 43/18 (26), PCWP 11. CO/CI 5.3/1.8.   CKD (chronic kidney disease), stage II    Diabetes mellitus without complication (HCC)    type 2   Dyspnea  with exertion   Gout    Hyperlipidemia LDL goal <70    Hypertension    Ischemic cardiomyopathy    a. 03/2015 Echo: EF 35-40%; b. 08/2017 Echo: EF 15%; c. 08/2017 cMRI: EF 35%; d. 06/2018 Echo: EF 10-15%; e. 07/2022 Echo: EF 15-20%, grIII DD.   IVCD (intraventricular conduction defect)    Left ventricular noncompaction [facility])    Morbid obesity (HCC)    NSVT (nonsustained ventricular tachycardia) (HCC)    a. 09/2022 Event monitor: predominantly sinus rhythm, avg HR 81 (46-167). 17 runs NSVT (fastest/longest 13 beats x 167). Rare PACs, 17.5% PVC burden.   PVC's (premature ventricular contractions)    a. 09/2022 Event Monitor: 17.5% PVC burden.   Statin intolerance    Tobacco abuse     Has the patient had major surgery during 100 days prior to admission? Yes  Family History   family history is not on file.  Current Medications  Current Facility-Administered Medications:    amiodarone  (NEXTERONE   PREMIX) 360-4.14 MG/200ML-% (1.8 mg/mL) IV infusion, 30 mg/hr, Intravenous, Continuous, Rolan Ezra RAMAN, MD, Last Rate: 16.67 mL/hr at 01/17/24 0734, 30 mg/hr at 01/17/24 0734   bisacodyl (DULCOLAX) EC tablet 10 mg, 10 mg, Oral, Daily, 10 mg at 01/17/24 0900 **OR** bisacodyl (DULCOLAX) suppository 10 mg, 10 mg, Rectal, Daily, Su, Con RAMAN, MD   Chlorhexidine  Gluconate Cloth 2 % PADS 6 each, 6 each, Topical, Daily, Su, Con RAMAN, MD, 6 each at 01/17/24 1000   dextrose  50 % solution 0-50 mL, 0-50 mL, Intravenous, PRN, Su, Con RAMAN, MD   docusate sodium (COLACE) capsule 200 mg, 200 mg, Oral, Daily, Su, Bailey S, MD, 200 mg at 01/17/24 0900   enoxaparin  (LOVENOX ) injection 40 mg, 40 mg, Subcutaneous, Daily, Su, Bailey S, MD, 40 mg at 01/17/24 0900   ezetimibe  (ZETIA ) tablet 10 mg, 10 mg, Oral, Daily, Clegg, Amy D, NP, 10 mg at 01/17/24 0900   feeding supplement (ENSURE PLUS HIGH PROTEIN) liquid 237 mL, 237 mL, Oral, TID BM, Clark, Laura P, DO, 237 mL at 01/17/24 1000   gabapentin (NEURONTIN) capsule 400 mg, 400 mg, Oral, QHS, Colletta, Manuelita Garre, PA-C   CBG monitoring, , , 4x Daily, AC & HS **AND** insulin  aspart (novoLOG ) injection 0-24 Units, 0-24 Units, Subcutaneous, TID WC, Daniel Con RAMAN, MD, 2 Units at 01/17/24 1303   insulin  glargine-yfgn (SEMGLEE) injection 18 Units, 18 Units, Subcutaneous, Daily, Bowser, Grace E, NP, 18 Units at 01/17/24 0901   lidocaine  (LIDODERM ) 5 % 1 patch, 1 patch, Transdermal, Q24H, Clegg, Amy D, NP   mexiletine (MEXITIL ) capsule 150 mg, 150 mg, Oral, Q8H, Lee, Jordan, NP, 150 mg at 01/17/24 1302   milrinone  (PRIMACOR ) 20 MG/100 ML (0.2 mg/mL) infusion, 0.125 mcg/kg/min, Intravenous, Continuous, Clegg, Amy D, NP, Last Rate: 5.43 mL/hr at 01/17/24 1104, 0.125 mcg/kg/min at 01/17/24 1104   morphine  (PF) 2 MG/ML injection 1-4 mg, 1-4 mg, Intravenous, Q1H PRN, Daniel Con RAMAN, MD, 4 mg at 01/14/24 1000   ondansetron  (ZOFRAN ) injection 4 mg, 4 mg, Intravenous, Q6H PRN, Su, Con RAMAN, MD   Oral care mouth rinse, 15 mL, Mouth Rinse, PRN, Su, Con RAMAN, MD   oxyCODONE  (Oxy IR/ROXICODONE ) immediate release tablet 10-15 mg, 10-15 mg, Oral, Q3H PRN, Gleason, Leita SAUNDERS, PA-C, 15 mg at 01/17/24 1302   pantoprazole (PROTONIX) EC tablet 40 mg, 40 mg, Oral, Daily, Su, Bailey S, MD, 40 mg at 01/17/24 0900   potassium chloride  SA (KLOR-CON  M) CR tablet 40 mEq, 40 mEq,  Oral, Q4H, Zenaida Morene PARAS, MD, 40 mEq at 01/17/24 1107   sodium chloride  flush (NS) 0.9 % injection 10-40 mL, 10-40 mL, Intracatheter, Q12H, Su, Con RAMAN, MD, 30 mL at 01/17/24 1000   sodium chloride  flush (NS) 0.9 % injection 10-40 mL, 10-40 mL, Intracatheter, PRN, Su, Con RAMAN, MD   sodium chloride  flush (NS) 0.9 % injection 3 mL, 3 mL, Intravenous, Q12H, Su, Bailey S, MD, 3 mL at 01/15/24 2136   sodium chloride  flush (NS) 0.9 % injection 3 mL, 3 mL, Intravenous, PRN, Su, Con RAMAN, MD   spironolactone  (ALDACTONE ) tablet 50 mg, 50 mg, Oral, Daily, Stoner, Benjamin J, MD, 50 mg at 01/17/24 0900   traMADol (ULTRAM) tablet 50-100 mg, 50-100 mg, Oral, Q4H PRN, Daniel Con RAMAN, MD, 100 mg at 01/17/24 0754   Warfarin - Pharmacist Dosing Inpatient, , Does not apply, q1600, Su, Con RAMAN, MD  Patients Current Diet:  Diet Order             Diet regular Room service appropriate? Yes with Assist; Fluid consistency: Thin  Diet effective now                   Precautions / Restrictions Precautions Precautions: Sternal, Fall Precaution Booklet Issued: Yes (comment) Precaution/Restrictions Comments: LVAD Restrictions Weight Bearing Restrictions Per Provider Order: Yes RUE Weight Bearing Per Provider Order: Non weight bearing LUE Weight Bearing Per Provider Order: Non weight bearing Other Position/Activity Restrictions: Cardiac Sternal Precautions   Has the patient had 2 or more falls or a fall with injury in the past year? No  Prior Activity Level Household: Has been homebound lately except for MD appointments  Prior  Functional Level Self Care: Did the patient need help bathing, dressing, using the toilet or eating? Independent  Indoor Mobility: Did the patient need assistance with walking from room to room (with or without device)? Independent  Stairs: Did the patient need assistance with internal or external stairs (with or without device)? Independent  Functional Cognition: Did the patient need help planning regular tasks such as shopping or remembering to take medications? Independent  Patient Information Are you of Hispanic, Latino/a,or Spanish origin?: A. No, not of Hispanic, Latino/a, or Spanish origin What is your race?: B. Black or African American Do you need or want an interpreter to communicate with a doctor or health care staff?: 0. No Patient information obtained via proxy : No  Patient's Response To:  Health Literacy and Transportation Is the patient able to respond to health literacy and transportation needs?: Yes Health Literacy - How often do you need to have someone help you when you read instructions, pamphlets, or other written material from your doctor or pharmacy?: Never In the past 12 months, has lack of transportation kept you from medical appointments or from getting medications?: No In the past 12 months, has lack of transportation kept you from meetings, work, or from getting things needed for daily living?: No Health Literacy and Transportation obtained via proxy: No  Journalist, Newspaper / Equipment Home Equipment: Crutches  Prior Device Use: Indicate devices/aids used by the patient prior to current illness, exacerbation or injury? None of the above  Current Functional Level Cognition  Orientation Level: Oriented X4    Extremity Assessment (includes Sensation/Coordination)  Upper Extremity Assessment: Generalized weakness (reports sensation changes in digits 1-3 of L hand)  Lower Extremity Assessment: Defer to PT evaluation    ADLs  Overall ADL's : Needs  assistance/impaired Eating/Feeding: Set up, Sitting  Grooming: Set up, Sitting Upper Body Bathing: Moderate assistance Lower Body Bathing: Moderate assistance Upper Body Dressing : Moderate assistance Lower Body Dressing: Moderate assistance Toilet Transfer: Moderate assistance Functional mobility during ADLs: Minimal assistance, Moderate assistance, +2 for physical assistance    Mobility  Overal bed mobility: Needs Assistance Bed Mobility: Supine to Sit Supine to sit: HOB elevated, Contact guard Sit to supine: Mod assist, +2 for physical assistance General bed mobility comments: CGA for safety with cues for technique. Able to bring LEs over edge of bed and use abdominal muscles to rise without physical assist, HOB elevated.    Transfers  Overall transfer level: Needs assistance Equipment used: Ambulation equipment used (eva walker) Transfers: Sit to/from Stand Sit to Stand: Min assist, From elevated surface Bed to/from chair/wheelchair/BSC transfer type:: Step pivot Step pivot transfers: Min assist, +2 safety/equipment General transfer comment: Min assist for boost to stand from elevated bed surface, rocks for momentum. Cues for technique, holding heart pillow for comfort and to reinforce sternal precautions. Safely transitions to eva walker    Ambulation / Gait / Stairs / Wheelchair Mobility  Ambulation/Gait Ambulation/Gait assistance: Contact guard assist, +2 safety/equipment Gait Distance (Feet): 120 Feet Assistive device: Elyn Finder Gait Pattern/deviations: Step-through pattern, Decreased stride length, Wide base of support, Drifts right/left, Trunk flexed General Gait Details: CGA for safety +2 for equipment management. Minor instability, frequent cues for upright posture and proximity to device for maximum support while maintaining sternal precautions. Required a couple of standing rest breaks to complete distance, initially on 4L, RN increased further - unable to obtain  consistent SpO2. mild/moderate dyspnea. Gait velocity: dec Gait velocity interpretation: <1.31 ft/sec, indicative of household ambulator Pre-gait activities: Weight shift static march    Posture / Balance Balance Overall balance assessment: Needs assistance Sitting-balance support: No upper extremity supported, Feet supported Sitting balance-Leahy Scale: Fair Standing balance support: No upper extremity supported Standing balance-Leahy Scale: Fair Standing balance comment: CGA standing statically    Special considerations/life events  Continuous Drip IV  Amiodarone  and milrinone  drips, Skin post op chest incision with dressing in place, Diabetic management Last Hgb A1c 6.6 in 09/25, and Special service needs LVAD in place   Previous Home Environment (from acute therapy documentation) Living Arrangements: Children, Parent Available Help at Discharge: Family, Available 24 hours/day Type of Home: Mobile home Home Layout: One level Home Access: Stairs to enter Entrance Stairs-Rails: Can reach both Entrance Stairs-Number of Steps: 3 Bathroom Shower/Tub: Hydrographic surveyor, Health Visitor: Standard Home Care Services: No  Discharge Living Setting Plans for Discharge Living Setting: Patient's home, Mobile Home, Lives with (comment) (Lives with mom and daughter) Type of Home at Discharge: Mobile home Discharge Home Layout: One level Discharge Home Access: Stairs to enter Entrance Stairs-Rails: Right, Left, Can reach both Entrance Stairs-Number of Steps: 3 Discharge Bathroom Shower/Tub: Tub/shower unit, Walk-in shower, Door, Curtain (Tub with curtain and shower with a door) Discharge Bathroom Toilet: Standard Discharge Bathroom Accessibility: Yes How Accessible: Accessible via walker Does the patient have any problems obtaining your medications?: Yes (Describe)  Social/Family/Support Systems Patient Roles: Parent, Other (Comment) (Has mom and a daughter in the  home) Contact Information: Ysmael Hires - sister - [phone] Anticipated Caregiver: mom and daughter Ability/Limitations of Caregiver: Mom and daughter do not work and can Geneticist, Molecular Availability: 24/7 Discharge Plan Discussed with Primary Caregiver: Yes Is Caregiver In Agreement with Plan?: Yes Does Caregiver/Family have Issues with Lodging/Transportation while Pt is in Rehab?: No  Goals Patient/Family Goal for  Rehab: PT/OT supervision goals Expected length of stay: 10-14 days Pt/Family Agrees to Admission and willing to participate: Yes Program Orientation Provided & Reviewed with Pt/Caregiver Including Roles  & Responsibilities: Yes  Decrease burden of Care through IP rehab admission: N/A  Possible need for SNF placement upon discharge: Not planned  Patient Condition: I have reviewed medical records from [facility], spoken with CM, and patient. I met with patient at the bedside for inpatient rehabilitation assessment.  Patient will benefit from ongoing PT and OT, can actively participate in 3 hours of therapy a day 5 days of the week, and can make measurable gains during the admission.  Patient will also benefit from the coordinated team approach during an Inpatient Acute Rehabilitation admission.  The patient will receive intensive therapy as well as Rehabilitation physician, nursing, social worker, and care management interventions.  Due to bladder management, bowel management, safety, skin/wound care, disease management, medication administration, pain management, and patient education the patient requires 24 hour a day rehabilitation nursing.  The patient is currently *** with mobility and basic ADLs.  Discharge setting and therapy post discharge at home with home health is anticipated.  Patient has agreed to participate in the Acute Inpatient Rehabilitation Program and will admit {Time; today/tomorrow:10263}.  Preadmission Screen Completed By:  Lovett CHRISTELLA Ropes, 01/17/2024  2:11 PM ______________________________________________________________________   Discussed status with Dr. PIERRETTE on *** at *** and received approval for admission today.  Admission Coordinator:  Lovett CHRISTELLA Ropes, RN, time PIERRETTEPattricia ***   Assessment/Plan: Diagnosis: *** Does the need for close, 24 hr/day Medical supervision in concert with the patient's rehab needs make it unreasonable for this patient to be served in a less intensive setting? {yes_no_potentially:3041433} Co-Morbidities requiring supervision/potential complications: *** Due to {due un:6958565}, does the patient require 24 hr/day rehab nursing? {yes_no_potentially:3041433} Does the patient require coordinated care of a physician, rehab nurse, PT, OT, and SLP to address physical and functional deficits in the context of the above medical diagnosis(es)? {yes_no_potentially:3041433} Addressing deficits in the following areas: {deficits:3041436} Can the patient actively participate in an intensive therapy program of at least 3 hrs of therapy 5 days a week? {yes_no_potentially:3041433} The potential for patient to make measurable gains while on inpatient rehab is {potential:3041437} Anticipated functional outcomes upon discharge from inpatient rehab: {functional outcomes:304600100} PT, {functional outcomes:304600100} OT, {functional outcomes:304600100} SLP Estimated rehab length of stay to reach the above functional goals is: *** Anticipated discharge destination: {anticipated dc setting:21604} 10. Overall Rehab/Functional Prognosis: {potential:3041437}   MD Signature: ***

## 2024-01-17 NOTE — Telephone Encounter (Signed)
 Pharmacy Patient Advocate Encounter  Insurance verification completed.    The patient is insured through [employer].     Ran test claim for Jardiance  10mg  and the current 30 day co-pay is $4.00.   This test claim was processed through [facility]- copay amounts may vary at other pharmacies due to pharmacy/plan contracts, or as the patient moves through the different stages of their insurance plan.

## 2024-01-17 NOTE — Progress Notes (Signed)
 IP rehab admissions - I met with patient at the bedside.  He is interested in CIR prior to home.  His mom and daughter live with him and can provide supervision and assist as needed.  Will follow progress for now.  [phone]

## 2024-01-17 NOTE — Progress Notes (Signed)
 NAME:  Philip Monroe, MRN:  [id number], DOB:  01/02/1976, LOS: 12 ADMISSION DATE:  01/05/2024, CONSULTATION DATE: 10/30  REFERRING MD:  MD Su CHIEF COMPLAINT:  LVAD  History of Present Illness:  Patient is a 48 year old male with significant past medical history of chronic HFrEF < 20%, CAD multivessel (recent [facility] 5/24), followed in past by [facility] Cards for iCM/noncompaction CM, pulmonary HTN, HTN, CKD stage III, diabetes type 2, HLD, hx of ETOH use, multiple recent admission of CHF exacerbations- most recent in 9/25 who was admitted on 01/05/24 for HF optimization, and also teeth extraction x 4 (occurred on 10/27) for LVAD surgery on 01/11/24 by MD Su with TCTS. PCCM consulted to assist with post-op vent management as well as critical care needs during ICU stay.    Pertinent  Medical History   Past Medical History:  Diagnosis Date   Aortic root dilatation    a. 07/2022 Echo: Ao root 4.4cm, Asc Ao 4.0cm.   Asthma    as a child, no problems as an adult, rarely uses inhaler   CAD (coronary artery disease)    a. 08/2017 Cath: LM nl, LAD 65m/d, D1 70ost, LCX 184m, OM1 90 inf branch, 60 sup branch, OM2 small, RCA 30 diffuse, RPDA 100 w/ L->R collats; b. 07/2022 Echo: LM nl, LAD 4m, D1 80, LCX 154m, OM1 80, RCA 78m, RPDA 100-->No targets for PCI/CABG-->Med rx.   CHF (congestive heart failure) (HCC)    takes lasix    Chronic HFrEF (heart failure with reduced ejection fraction) (HCC)    a. 03/2015 Echo: EF 35-40%; b. 08/2017 Echo: EF 15%; c. 08/2017 cMRI: EF 35%; d. 06/2018 Echo: EF 10-15%; e. 07/2022 Echo: EF 15-20%, grIII DD, mild MR, sev dil LA, mod-sev PH, Ao root 4.4cm, Asc Ao 4.0cm; f. 07/2022 RHC: RA 4, RV 38/3, PA 43/18 (26), PCWP 11. CO/CI 5.3/1.8.   CKD (chronic kidney disease), stage II    Diabetes mellitus without complication (HCC)    type 2   Dyspnea    with exertion   Gout    Hyperlipidemia LDL goal <70    Hypertension    Ischemic cardiomyopathy    a. 03/2015 Echo: EF 35-40%; b. 08/2017  Echo: EF 15%; c. 08/2017 cMRI: EF 35%; d. 06/2018 Echo: EF 10-15%; e. 07/2022 Echo: EF 15-20%, grIII DD.   IVCD (intraventricular conduction defect)    Left ventricular noncompaction [facility])    Morbid obesity (HCC)    NSVT (nonsustained ventricular tachycardia) (HCC)    a. 09/2022 Event monitor: predominantly sinus rhythm, avg HR 81 (46-167). 17 runs NSVT (fastest/longest 13 beats x 167). Rare PACs, 17.5% PVC burden.   PVC's (premature ventricular contractions)    a. 09/2022 Event Monitor: 17.5% PVC burden.   Statin intolerance    Tobacco abuse      Significant Hospital Events: Including procedures, antibiotic start and stop dates in addition to other pertinent events   10/24 Admit for HF optimization for LVAD surgery on 10/30 10/27 Teeth Extraction x 4 by oral surgery  10/30 HM3 LVAD placement, PCCM consult for post-op management  11/3 remains on milrinone  0.25, no pressor requirement 11/4 off ino, ambulated with Svo2 drop 11/5 plan for wires and tubes out   Interim History / Subjective:  Ramp study yesterday and speed increased to 5600 2.5L UOP yesterday Coox 68%, CVP 3 Amiodarone  30mg  Milrinone  0.25 C/o fatigue and some pocket pain this AM   Objective    Blood pressure 102/79, pulse 72, temperature 98.4  F (36.9 C), temperature source Oral, resp. rate (!) 21, height 6' 3 (1.905 m), weight (!) 143.4 kg, SpO2 96%. CVP:  [3 mmHg-11 mmHg] 3 mmHg      Intake/Output Summary (Last 24 hours) at 01/17/2024 1029 Last data filed at 01/17/2024 0700 Gross per 24 hour  Intake 2238.14 ml  Output 2650 ml  Net -411.86 ml   Filed Weights   01/15/24 0500 01/16/24 0500 01/17/24 0500  Weight: (!) 147 kg (!) 143.7 kg (!) 143.4 kg    General:  well nourished M sitting up in bed in NAD  HEENT: MM pink/moist Neuro: alert and oriented and moving all extremities  CV: s1s2, paced, LVAD hum, dressings clean and dry  PULM:  mildly diminished in the bilateral bases  GI: soft, mildly distended  without TTP Extremities: warm/dry,  trace edema   Labs and imaging reviewed    Resolved problem list   Assessment and Plan   Acute hypoxic respiratory failure postoperatively -improving - Extubated 10/31, off Ino 11/4 - Currently on nasal cannula titrate down as able  -encourage IS and mobilization   Chronic systolic HF s/p LVAD HM III  mvCAD Mild pHTN PVCs with prior poor amio tolerance  Hx HTN  Hx HLD  Hypervolemic hyponatremia -post op, surgical care per CVTS -plan to remove wires and wean Milrinone  today -complete ppx abx  -adv HF following,  -LVAD Interrogation shows speed: 5600 Flow: 6 PI: 10 Power: 4 -Ramp study done 11/4 -follow coox ldh -Continue amiodarone   - On mexiletine  -continue current insulin  regimen to maintain goal 140-180 -continue Coumadin, management per pharmacy -statin, no ASA w AC above  -pain control with Neurontin 400mg  at bedtime and oxycodone  10-15mg  prn, add lidocaine  patch    CKD II Hypokalemia  -follow renal indices UOP  -creatinine stable with good UOP -replete K prn   Expected ABLA, thrombocytopenia  -follow CBC   Morbid obesity -no acute intervention   S/p Teeth Extraction 10/27  -Per oral surgery  Gout  -resume home meds when appropriate   Labs   CBC: Recent Labs  Lab 01/13/24 0357 01/13/24 0407 01/14/24 0502 01/14/24 0641 01/15/24 0529 01/15/24 0558 01/16/24 0446 01/17/24 0404 01/17/24 0433  WBC 16.9*  --  16.7*  --  14.0*  --  12.2* 13.4*  --   NEUTROABS 12.2*  --  12.0*  --  9.4*  --  8.0* 8.8*  --   HGB 10.7*   < > 11.1*   < > 11.2* 12.6* 10.7* 10.1* 11.2*  HCT 33.8*   < > 34.5*   < > 33.7* 37.0* 32.7* 30.9* 33.0*  MCV 81.3  --  80.2  --  78.0*  --  79.2* 79.8*  --   PLT 128*  --  130*  --  161  --  170 185  --    < > = values in this interval not displayed.    Basic Metabolic Panel: Recent Labs  Lab 01/13/24 0357 01/13/24 0407 01/14/24 0502 01/14/24 9358 01/15/24 0529 01/15/24 0558  01/16/24 0446 01/16/24 0553 01/16/24 1431 01/17/24 0404 01/17/24 0433  NA 127*   < > 126*   < > 127* 126*  --  126* 128* 125* 126*  K 4.7   < > 3.8   < > 3.2* 3.2*  --  2.9* 3.4* 3.2* 3.4*  CL 95*   < > 89*  --  84*  --   --  82* 85* 85*  --  CO2 23   < > 26  --  29  --   --  29 29 26   --   GLUCOSE 126*   < > 127*  --  114*  --   --  252* 103* 268*  --   BUN 11   < > 11  --  18  --   --  22* 22* 22*  --   CREATININE 1.23   < > 1.07  --  1.19  --   --  1.28* 1.26* 1.14  --   CALCIUM 8.4*   < > 8.6*  --  8.7*  --   --  8.1* 8.7* 8.2*  --   MG 1.9  --  1.5*  --  1.7  --  2.2  --   --  2.0  --   PHOS 3.5  --  3.4  --  3.5  --  3.9  --   --  3.0  --    < > = values in this interval not displayed.   GFR: Estimated Creatinine Clearance: 122.5 mL/min (by C-G formula based on SCr of 1.14 mg/dL). Recent Labs  Lab 01/14/24 0502 01/15/24 0529 01/16/24 0446 01/17/24 0404  WBC 16.7* 14.0* 12.2* 13.4*    Liver Function Tests: Recent Labs  Lab 01/12/24 0500 01/13/24 0357 01/14/24 0502  AST 68* 60* 37  ALT 25 21 16   ALKPHOS 44 47 54  BILITOT 1.5* 1.8* 1.3*  PROT 5.5* 5.6* 6.2*  ALBUMIN 3.3* 3.0* 3.0*   No results for input(s): LIPASE, AMYLASE in the last 168 hours. No results for input(s): AMMONIA in the last 168 hours.  ABG    Component Value Date/Time   PHART 7.463 (H) 01/17/2024 0433   PCO2ART 35.8 01/17/2024 0433   PO2ART 81 (L) 01/17/2024 0433   HCO3 25.7 01/17/2024 0433   TCO2 27 01/17/2024 0433   ACIDBASEDEF 2.0 01/13/2024 0407   O2SAT 68.8 01/17/2024 0501     Coagulation Profile: Recent Labs  Lab 01/13/24 0357 01/14/24 0502 01/15/24 0529 01/16/24 0446 01/17/24 0404  INR 1.5* 1.4* 1.4* 1.5* 1.6*    Cardiac Enzymes: No results for input(s): CKTOTAL, CKMB, CKMBINDEX, TROPONINI in the last 168 hours.  HbA1C: Hgb A1c MFr Bld  Date/Time Value Ref Range Status  11/29/2023 04:45 AM 6.6 (H) 4.8 - 5.6 % Final    Comment:    (NOTE) Diagnosis of  Diabetes The following HbA1c ranges recommended by the American Diabetes Association (ADA) may be used as an aid in the diagnosis of diabetes mellitus.  Hemoglobin             Suggested A1C NGSP%              Diagnosis  <5.7                   Non Diabetic  5.7-6.4                Pre-Diabetic  >6.4                   Diabetic  <7.0                   Glycemic control for                       adults with diabetes.      CBG: Recent Labs  Lab 01/16/24 [phone] 01/16/24 1152 01/16/24 1612 01/16/24 2111 01/17/24 9387  GLUCAP 110* 157* 209* 79 109*   CRITICAL CARE Performed by: Leita SAUNDERS Casia Corti   Total critical care time: 35 minutes  Critical care time was exclusive of separately billable procedures and treating other patients.  Critical care was necessary to treat or prevent imminent or life-threatening deterioration.  Critical care was time spent personally by me on the following activities: development of treatment plan with patient and/or surrogate as well as nursing, discussions with consultants, evaluation of patient's response to treatment, examination of patient, obtaining history from patient or surrogate, ordering and performing treatments and interventions, ordering and review of laboratory studies, ordering and review of radiographic studies, pulse oximetry and re-evaluation of patient's condition.  Leita SAUNDERS Zamarian Scarano, PA-C [facility] See Amion for pager If no response to pager , please call [phone] until 7pm After 7:00 pm call Elink  [phone]

## 2024-01-17 NOTE — Progress Notes (Signed)
 PHARMACY - ANTICOAGULATION CONSULT NOTE  Pharmacy Consult for warfarin Indication: LVAD   Allergies  Allergen Reactions   Lactose Intolerance (Gi) Diarrhea   Statins     myalgias    Patient Measurements: Height: 6' 3 (190.5 cm) Weight: (!) 143.4 kg (316 lb 2.2 oz) (Pt holding LVAD monitor) IBW/kg (Calculated) : 84.5 HEPARIN  DW (KG): 117  Vital Signs: Temp: 98.4 F (36.9 C) (11/05 0944) Temp Source: Oral (11/05 0944) BP: 102/79 (11/05 0800) Pulse Rate: 72 (11/05 0800)  Labs: Recent Labs    01/15/24 0529 01/15/24 0558 01/16/24 0446 01/16/24 0553 01/16/24 1431 01/17/24 0404 01/17/24 0433  HGB 11.2*   < > 10.7*  --   --  10.1* 11.2*  HCT 33.7*   < > 32.7*  --   --  30.9* 33.0*  PLT 161  --  170  --   --  185  --   LABPROT 17.8*  --  18.5*  --   --  19.9*  --   INR 1.4*  --  1.5*  --   --  1.6*  --   CREATININE 1.19  --   --  1.28* 1.26* 1.14  --    < > = values in this interval not displayed.    Estimated Creatinine Clearance: 122.5 mL/min (by C-G formula based on SCr of 1.14 mg/dL).   Medical History: Past Medical History:  Diagnosis Date   Aortic root dilatation    a. 07/2022 Echo: Ao root 4.4cm, Asc Ao 4.0cm.   Asthma    as a child, no problems as an adult, rarely uses inhaler   CAD (coronary artery disease)    a. 08/2017 Cath: LM nl, LAD 20m/d, D1 70ost, LCX 130m, OM1 90 inf branch, 60 sup branch, OM2 small, RCA 30 diffuse, RPDA 100 w/ L->R collats; b. 07/2022 Echo: LM nl, LAD 41m, D1 80, LCX 170m, OM1 80, RCA 60m, RPDA 100-->No targets for PCI/CABG-->Med rx.   CHF (congestive heart failure) (HCC)    takes lasix    Chronic HFrEF (heart failure with reduced ejection fraction) (HCC)    a. 03/2015 Echo: EF 35-40%; b. 08/2017 Echo: EF 15%; c. 08/2017 cMRI: EF 35%; d. 06/2018 Echo: EF 10-15%; e. 07/2022 Echo: EF 15-20%, grIII DD, mild MR, sev dil LA, mod-sev PH, Ao root 4.4cm, Asc Ao 4.0cm; f. 07/2022 RHC: RA 4, RV 38/3, PA 43/18 (26), PCWP 11. CO/CI 5.3/1.8.   CKD  (chronic kidney disease), stage II    Diabetes mellitus without complication (HCC)    type 2   Dyspnea    with exertion   Gout    Hyperlipidemia LDL goal <70    Hypertension    Ischemic cardiomyopathy    a. 03/2015 Echo: EF 35-40%; b. 08/2017 Echo: EF 15%; c. 08/2017 cMRI: EF 35%; d. 06/2018 Echo: EF 10-15%; e. 07/2022 Echo: EF 15-20%, grIII DD.   IVCD (intraventricular conduction defect)    Left ventricular noncompaction [facility])    Morbid obesity (HCC)    NSVT (nonsustained ventricular tachycardia) (HCC)    a. 09/2022 Event monitor: predominantly sinus rhythm, avg HR 81 (46-167). 17 runs NSVT (fastest/longest 13 beats x 167). Rare PACs, 17.5% PVC burden.   PVC's (premature ventricular contractions)    a. 09/2022 Event Monitor: 17.5% PVC burden.   Statin intolerance    Tobacco abuse     Medications:  Scheduled:   bisacodyl  10 mg Oral Daily   Or   bisacodyl  10 mg Rectal Daily  Chlorhexidine  Gluconate Cloth  6 each Topical Daily   docusate sodium  200 mg Oral Daily   enoxaparin  (LOVENOX ) injection  40 mg Subcutaneous Daily   ezetimibe   10 mg Oral Daily   feeding supplement  237 mL Oral TID BM   gabapentin  400 mg Oral QHS   insulin  aspart  0-24 Units Subcutaneous TID WC   insulin  glargine-yfgn  18 Units Subcutaneous Daily   lidocaine   1 patch Transdermal Q24H   mexiletine  150 mg Oral Q8H   pantoprazole  40 mg Oral Daily   potassium chloride   40 mEq Oral Q4H   sodium chloride  flush  10-40 mL Intracatheter Q12H   sodium chloride  flush  3 mL Intravenous Q12H   spironolactone   50 mg Oral Daily   Warfarin - Pharmacist Dosing Inpatient   Does not apply q1600    Assessment: 48 yom who underwent HM3 LVAD implantation on 10/30. Of note had recent teeth extraction on 10/27. Was on apixaban  PTA for hx Afib - LD 10/22.   Warfarin started post-op on 10/31 (INR 1.2). INR today up to 1.6. Hgb 11.2, plt 185, LDH 253. Remains on enox40 while INR subtherapeutic. Talked with patient who said  appetite ~40% of his normal - did have 3 ensures yesterday.   Goal of Therapy:  INR 2-2.5 Monitor platelets by anticoagulation protocol: Yes   Plan:  Warfarin 7.5 mg tonight  Monitor daily INR, CBC, and for s/sx of bleeding   Thank you for allowing pharmacy to participate in this patient's care,  Suzen Sour, PharmD, BCCCP Clinical Pharmacist  Phone: [phone] 01/17/2024 12:13 PM  Please check AMION for all [facility] Pharmacy phone numbers After 10:00 PM, call Main Pharmacy [phone]

## 2024-01-17 NOTE — Progress Notes (Signed)
 Patient ID: Philip Monroe, male   DOB: 1975/07/15, 48 y.o.   MRN: [id number]   Advanced Heart Failure Rounding Note  Chief Complaint: s/p HM3 Implant Subjective:    10/30 S/P HMIII 11/4 Ramp Echo Speed increased 5600  CO-OX stable on Milrinone  0.25 mcg.   Complaining of pocket pain.    LVAD Interrogation HM III: Speed: 5600 Flow: 5 PI:3.2  Power: 4 PI  10 PI events today   Objective:    Vital Signs:   Temp:  [97.3 F (36.3 C)-98.3 F (36.8 C)] 98.1 F (36.7 C) (11/05 0409) Pulse Rate:  [61-87] 72 (11/05 0800) Resp:  [15-25] 21 (11/05 0800) BP: (88-121)/(62-112) 102/79 (11/05 0800) SpO2:  [68 %-100 %] 96 % (11/05 0800) Weight:  [143.4 kg] 143.4 kg (11/05 0500) Last BM Date : 01/16/24  Weight change: Filed Weights   01/15/24 0500 01/16/24 0500 01/17/24 0500  Weight: (!) 147 kg (!) 143.7 kg (!) 143.4 kg   Intake/Output:  Intake/Output Summary (Last 24 hours) at 01/17/2024 0924 Last data filed at 01/17/2024 0700 Gross per 24 hour  Intake 2238.14 ml  Output 2800 ml  Net -561.86 ml    Physical Exam  GENERAL: No acute distress. NECK:  JVP  flat.     CARDIAC:  Mechanical heart sounds with LVAD hum present.  LUNGS:  Clear to auscultation bilaterally.  ABDOMEN:  Soft, round,  LVAD exit site:  Dressing dry and intact.   EXTREMITIES:  Warm and dry, no edema.  NEUROLOGIC:  Alert and oriented x 3.    Telemetry: SR 80s with PVCs   Labs: Basic Metabolic Panel: Recent Labs  Lab 01/13/24 0357 01/13/24 0407 01/14/24 0502 01/14/24 9358 01/15/24 0529 01/15/24 0558 01/16/24 0446 01/16/24 0553 01/16/24 1431 01/17/24 0404 01/17/24 0433  NA 127*   < > 126*   < > 127* 126*  --  126* 128* 125* 126*  K 4.7   < > 3.8   < > 3.2* 3.2*  --  2.9* 3.4* 3.2* 3.4*  CL 95*   < > 89*  --  84*  --   --  82* 85* 85*  --   CO2 23   < > 26  --  29  --   --  29 29 26   --   GLUCOSE 126*   < > 127*  --  114*  --   --  252* 103* 268*  --   BUN 11   < > 11  --  18  --   --  22* 22* 22*   --   CREATININE 1.23   < > 1.07  --  1.19  --   --  1.28* 1.26* 1.14  --   CALCIUM 8.4*   < > 8.6*  --  8.7*  --   --  8.1* 8.7* 8.2*  --   MG 1.9  --  1.5*  --  1.7  --  2.2  --   --  2.0  --   PHOS 3.5  --  3.4  --  3.5  --  3.9  --   --  3.0  --    < > = values in this interval not displayed.   Liver Function Tests: Recent Labs  Lab 01/12/24 0500 01/13/24 0357 01/14/24 0502  AST 68* 60* 37  ALT 25 21 16   ALKPHOS 44 47 54  BILITOT 1.5* 1.8* 1.3*  PROT 5.5* 5.6* 6.2*  ALBUMIN 3.3* 3.0* 3.0*  No results for input(s): LIPASE, AMYLASE in the last 168 hours. No results for input(s): AMMONIA in the last 168 hours.  CBC: Recent Labs  Lab 01/13/24 0357 01/13/24 0407 01/14/24 0502 01/14/24 0641 01/15/24 0529 01/15/24 0558 01/16/24 0446 01/17/24 0404 01/17/24 0433  WBC 16.9*  --  16.7*  --  14.0*  --  12.2* 13.4*  --   NEUTROABS 12.2*  --  12.0*  --  9.4*  --  8.0* 8.8*  --   HGB 10.7*   < > 11.1*   < > 11.2* 12.6* 10.7* 10.1* 11.2*  HCT 33.8*   < > 34.5*   < > 33.7* 37.0* 32.7* 30.9* 33.0*  MCV 81.3  --  80.2  --  78.0*  --  79.2* 79.8*  --   PLT 128*  --  130*  --  161  --  170 185  --    < > = values in this interval not displayed.   BNP (last 3 results) Recent Labs    12/25/23 1139 01/05/24 1907 01/12/24 0500  BNP 315.6* 674.8* 623.8*   Medications:    Scheduled Medications:  bisacodyl  10 mg Oral Daily   Or   bisacodyl  10 mg Rectal Daily   Chlorhexidine  Gluconate Cloth  6 each Topical Daily   docusate sodium  200 mg Oral Daily   enoxaparin  (LOVENOX ) injection  40 mg Subcutaneous Daily   ezetimibe   10 mg Oral Daily   feeding supplement  237 mL Oral TID BM   gabapentin  400 mg Oral QHS   insulin  aspart  0-24 Units Subcutaneous TID WC   insulin  glargine-yfgn  18 Units Subcutaneous Daily   mexiletine  150 mg Oral Q8H   pantoprazole  40 mg Oral Daily   potassium chloride   40 mEq Oral Q4H   sodium chloride  flush  10-40 mL Intracatheter Q12H   sodium  chloride flush  3 mL Intravenous Q12H   spironolactone   50 mg Oral Daily   Warfarin - Pharmacist Dosing Inpatient   Does not apply q1600    Infusions:  amiodarone  30 mg/hr (01/17/24 0734)   milrinone  0.25 mcg/kg/min (01/17/24 0700)    PRN Medications: dextrose , morphine  injection, ondansetron  (ZOFRAN ) IV, mouth rinse, oxyCODONE , sodium chloride  flush, sodium chloride  flush, traMADol   Plan/Discussion:    1. Chronic systolic heart failure: - combined ischemic and nonischemic: -Mentions of compaction CM from [facility] cards notes. PVCs may also contribute, 25.3% PVCs with NSVT on Zio monitor in 5/25 - RHC 10/25 with low filling pressures and reduced CI (1.9 TD, 2.2 Fick). - NYHA class IV symptoms on admission.  - S/P HM3 LVAD Implant - intra-op TEE with normal RV function (per anesthesia report).  - 11/4 Ramp Echo Speed increased 5600.  - CVP 2-3. CO-OX stable.  -Cut back milrinone  0.125 mcg.  - Continue spiro 25 mg daily -Remove foley. Start jardiance  10 mg daily  - Continue warfarin. INR 1.6.  - LDH trending down.  - Adding lidocaine  patch.     2 CAD:   - LHC 5/24 at [facility]: severe multivessel CAD including 80% mid RCA, 100% RPDA, 100% mid LCx, 80% OM1, 80% mid-distal LAD.  - LHC 9/25: occluded PDA and mid AV LCx, 90% ramus, severe diffuse disease in right PLV, nonobstructive LAD disease. With markedly low EF and without severe LAD disease, CABG nor a durable option.  - No ASA given anticoagulation use and stable CAD.  - Continue Zetia .   3. PVCs:  -  Zio 5/25 with 25.3% (prev did not tolerate amiodarone  well, on mexiletine) - increased burden post-op, now improving - Continue IV amiodarone  until off inotrope support. Should be able to switch to po amio.  - Continue mexilitine 150 mg every 8 hours.     CRITICAL CARE Performed by: Greig Mosses NP-C    Total critical care time: 20 minutes  Critical care time was exclusive of separately billable procedures and treating other  patients.  Critical care was necessary to treat or prevent imminent or life-threatening deterioration.  Critical care was time spent personally by me on the following activities: development of treatment plan with patient and/or surrogate as well as nursing, discussions with consultants, evaluation of patient's response to treatment, examination of patient, obtaining history from patient or surrogate, ordering and performing treatments and interventions, ordering and review of laboratory studies, ordering and review of radiographic studies, pulse oximetry and re-evaluation of patient's condition.    Length of Stay: 12

## 2024-01-17 NOTE — Progress Notes (Signed)
 LVAD Coordinator Rounding Note:  Admitted 12/25/23 due to CHF and planned teeth extractions prior to VAD implant.   HM3 LVAD implanted on 01/11/24 by BS under DT criteria.  Pt lying in bed on my arrival. States pain is not well under control at the time. Bedside RN made aware. Lidocaine  patch ordered per AHF team.   PI events significantly reduced from yesterday following increase in speed.  Drive line dressing  and equipment education to begin this afternoon with daughter.  Vital signs: Temp: HR: 76 Doppler Pressure: 102 Automatic BP: 102/79 (87) O2 Sat: 96% 3L [place] Wt: 319>349.2>334.2>335.1>324>316.8>316.1 lbs    LVAD interrogation reveals:   Speed: 5600 Flow: 5.0 Power:  4.3w PI:4.1 Alarms: none Events: 10 Hematocrit: 33 Fixed speed: 5500 Low speed limit: 5200  Drive Line: Existing VAD dressing removed and site care performed using sterile technique. Drive line exit site cleaned with Chlora prep applicators x 2, allowed to dry, and gauze dressing with Silverlon patch applied. Exit site with red rubber and 1 suture intact, unincorporated, the velour is fully implanted at exit site. Scant amount of bloody drainage. No redness, tenderness, foul odor or rash noted. Drive line anchor re-applied. Dressing changes MWF. Next dressing change tomorrow 01/19/24 by VAD coordinator or nurse champion only.       Labs:  LDH trend: 274>292>277>253  INR trend: 1.2>1.4>1.5>1.6  Anticoagulation Plan: -INR Goal: 2-2.5 -ASA Dose: none  Blood Products:  - <intra-op/post-op> 01/11/24 - 4 FFP - 625 cc of cell saver  Device: -n/a -Therapies:  Arrythmias: frequent  PVCs in OR  Respiratory:  Extubated 10/31  Renal:  -CRT: 1.35>1.09>1.28>1.14  Gtts: Amio 30 mg/hr Milrinone  0.125 mcg/kg/min  Adverse Events on VAD:  Patient Education: Education on dressing change and equipment provided to pt and his daughter and son at bedside.   Plan/Recommendations:  1. Please page VAD  coordinator for any equipment issues or VAD alarms as well as for any procedures on VAD pts. 2. Daily driveline dressing changes by VAD coordinator or nurse champion  Schuyler Lunger RN, BSN VAD Coordinator 24/7 Pager [phone]

## 2024-01-18 DIAGNOSIS — D72829 Elevated white blood cell count, unspecified: Secondary | ICD-10-CM

## 2024-01-18 DIAGNOSIS — R630 Anorexia: Secondary | ICD-10-CM

## 2024-01-18 DIAGNOSIS — E440 Moderate protein-calorie malnutrition: Secondary | ICD-10-CM

## 2024-01-18 DIAGNOSIS — I5023 Acute on chronic systolic (congestive) heart failure: Secondary | ICD-10-CM | POA: Diagnosis not present

## 2024-01-18 LAB — GLUCOSE, CAPILLARY
Glucose-Capillary: 109 mg/dL — ABNORMAL HIGH (ref 70–99)
Glucose-Capillary: 169 mg/dL — ABNORMAL HIGH (ref 70–99)
Glucose-Capillary: 102 mg/dL — ABNORMAL HIGH (ref 70–99)
Glucose-Capillary: 140 mg/dL — ABNORMAL HIGH (ref 70–99)

## 2024-01-18 LAB — CBC WITH DIFFERENTIAL/PLATELET
Abs Immature Granulocytes: 0.55 K/uL — ABNORMAL HIGH (ref 0.00–0.07)
Basophils Absolute: 0.1 K/uL (ref 0.0–0.1)
Basophils Relative: 0 %
Eosinophils Absolute: 0.2 K/uL (ref 0.0–0.5)
Eosinophils Relative: 1 %
HCT: 34.3 % — ABNORMAL LOW (ref 39.0–52.0)
Hemoglobin: 11 g/dL — ABNORMAL LOW (ref 13.0–17.0)
Immature Granulocytes: 3 %
Lymphocytes Relative: 10 %
Lymphs Abs: 2.1 K/uL (ref 0.7–4.0)
MCH: 25.6 pg — ABNORMAL LOW (ref 26.0–34.0)
MCHC: 32.1 g/dL (ref 30.0–36.0)
MCV: 80 fL (ref 80.0–100.0)
Monocytes Absolute: 2.6 K/uL — ABNORMAL HIGH (ref 0.1–1.0)
Monocytes Relative: 12 %
Neutro Abs: 16.4 K/uL — ABNORMAL HIGH (ref 1.7–7.7)
Neutrophils Relative %: 74 %
Platelets: 256 K/uL (ref 150–400)
RBC: 4.29 MIL/uL (ref 4.22–5.81)
RDW: 21.1 % — ABNORMAL HIGH (ref 11.5–15.5)
WBC: 21.9 K/uL — ABNORMAL HIGH (ref 4.0–10.5)
nRBC: 0 % (ref 0.0–0.2)

## 2024-01-18 LAB — BASIC METABOLIC PANEL WITH GFR
Anion gap: 12 (ref 5–15)
BUN: 19 mg/dL (ref 6–20)
CO2: 23 mmol/L (ref 22–32)
Calcium: 8.9 mg/dL (ref 8.9–10.3)
Chloride: 90 mmol/L — ABNORMAL LOW (ref 98–111)
Creatinine, Ser: 0.99 mg/dL (ref 0.61–1.24)
GFR, Estimated: >60 mL/min (ref >60)
Glucose, Bld: 115 mg/dL — ABNORMAL HIGH (ref 70–99)
Potassium: 3.8 mmol/L (ref 3.5–5.1)
Sodium: 125 mmol/L — ABNORMAL LOW (ref 135–145)

## 2024-01-18 LAB — PROTIME-INR
INR: 1.7 — ABNORMAL HIGH (ref 0.8–1.2)
Prothrombin Time: 20.5 s — ABNORMAL HIGH (ref 11.4–15.2)

## 2024-01-18 LAB — PHOSPHORUS: Phosphorus: 4.1 mg/dL (ref 2.5–4.6)

## 2024-01-18 LAB — HEPATIC FUNCTION PANEL
ALT: 17 U/L (ref 0–44)
AST: 26 U/L (ref 15–41)
Albumin: 2.5 g/dL — ABNORMAL LOW (ref 3.5–5.0)
Alkaline Phosphatase: 85 U/L (ref 38–126)
Bilirubin, Direct: 0.2 mg/dL (ref 0.0–0.2)
Indirect Bilirubin: 0.8 mg/dL (ref 0.3–0.9)
Total Bilirubin: 1 mg/dL (ref 0.0–1.2)
Total Protein: 6.7 g/dL (ref 6.5–8.1)

## 2024-01-18 LAB — BRAIN NATRIURETIC PEPTIDE: B Natriuretic Peptide: 1193 pg/mL — ABNORMAL HIGH (ref 0.0–100.0)

## 2024-01-18 LAB — COOXEMETRY PANEL
Carboxyhemoglobin: 2.9 % — ABNORMAL HIGH (ref 0.5–1.5)
Methemoglobin: <0.7 % (ref 0.0–1.5)
O2 Saturation: 70.1 %
Total hemoglobin: 10.8 g/dL — ABNORMAL LOW (ref 12.0–16.0)

## 2024-01-18 LAB — MAGNESIUM: Magnesium: 2.2 mg/dL (ref 1.7–2.4)

## 2024-01-18 LAB — LACTATE DEHYDROGENASE: LDH: 282 U/L — ABNORMAL HIGH (ref 98–192)

## 2024-01-18 MED ORDER — FUROSEMIDE 10 MG/ML IJ SOLN
80.0000 mg | Freq: Two times a day (BID) | INTRAMUSCULAR | Status: AC
  Administered 2024-01-18 (×2): 80 mg via INTRAVENOUS
  Filled 2024-01-18 (×2): qty 8

## 2024-01-18 MED ORDER — WARFARIN SODIUM 5 MG PO TABS
7.5000 mg | ORAL_TABLET | Freq: Once | ORAL | Status: AC
  Administered 2024-01-18: 7.5 mg via ORAL
  Filled 2024-01-18: qty 1

## 2024-01-18 MED ORDER — SIMETHICONE 80 MG PO CHEW
80.0000 mg | CHEWABLE_TABLET | Freq: Four times a day (QID) | ORAL | Status: DC | PRN
Start: 2024-01-18 — End: 2024-01-24
  Administered 2024-01-18 – 2024-01-20 (×4): 80 mg via ORAL
  Filled 2024-01-18 (×4): qty 1

## 2024-01-18 MED ORDER — POTASSIUM CHLORIDE CRYS ER 20 MEQ PO TBCR
40.0000 meq | EXTENDED_RELEASE_TABLET | ORAL | Status: AC
Start: 2024-01-18 — End: 2024-01-18
  Administered 2024-01-18 (×2): 40 meq via ORAL
  Filled 2024-01-18 (×2): qty 2

## 2024-01-18 MED ORDER — TORSEMIDE 20 MG PO TABS: 40.0000 mg | ORAL_TABLET | Freq: Every day | ORAL | Status: DC

## 2024-01-18 MED ORDER — MIRTAZAPINE 7.5 MG PO TABS
7.5000 mg | ORAL_TABLET | Freq: Every day | ORAL | Status: DC
  Administered 2024-01-18 – 2024-01-23 (×6): 7.5 mg via ORAL
  Filled 2024-01-18 (×6): qty 1

## 2024-01-18 MED ORDER — AMIODARONE HCL 200 MG PO TABS
200.0000 mg | ORAL_TABLET | Freq: Every day | ORAL | Status: DC
Start: 2024-01-18 — End: 2024-01-24
  Administered 2024-01-18 – 2024-01-24 (×7): 200 mg via ORAL
  Filled 2024-01-18 (×7): qty 1

## 2024-01-18 MED ORDER — DIGOXIN 125 MCG PO TABS
0.1250 mg | ORAL_TABLET | Freq: Every day | ORAL | Status: DC
  Administered 2024-01-18 – 2024-01-24 (×7): 0.125 mg via ORAL
  Filled 2024-01-18 (×7): qty 1

## 2024-01-18 MED ORDER — TORSEMIDE 20 MG PO TABS
40.0000 mg | ORAL_TABLET | Freq: Every day | ORAL | Status: DC
  Administered 2024-01-19 – 2024-01-21 (×3): 40 mg via ORAL
  Filled 2024-01-18 (×3): qty 2

## 2024-01-18 MED ORDER — ACETAZOLAMIDE 250 MG PO TABS
500.0000 mg | ORAL_TABLET | Freq: Once | ORAL | Status: AC
  Administered 2024-01-18: 500 mg via ORAL
  Filled 2024-01-18: qty 2

## 2024-01-18 MED FILL — Heparin Sodium (Porcine) Inj 1000 Unit/ML: INTRAMUSCULAR | Qty: 30 | Status: AC

## 2024-01-18 MED FILL — Albumin, Human Inj 5%: INTRAVENOUS | Qty: 250 | Status: AC

## 2024-01-18 MED FILL — Sodium Bicarbonate IV Soln 8.4%: INTRAVENOUS | Qty: 50 | Status: AC

## 2024-01-18 MED FILL — Mannitol IV Soln 20%: INTRAVENOUS | Qty: 500 | Status: AC

## 2024-01-18 MED FILL — henylephrine-NaCl Pref Syr 0.8 MG/10ML-0.9% (80 MCG/ML): INTRAVENOUS | Qty: 20 | Status: AC

## 2024-01-18 MED FILL — Electrolyte-R (PH 7.4) Solution: INTRAVENOUS | Qty: 6000 | Status: AC

## 2024-01-18 MED FILL — Heparin Sodium (Porcine) Inj 1000 Unit/ML: INTRAMUSCULAR | Qty: 20 | Status: AC

## 2024-01-18 NOTE — Progress Notes (Addendum)
 LVAD Coordinator Rounding Note:  Admitted 12/25/23 due to CHF and planned teeth extractions prior to VAD implant.   HM3 LVAD implanted on 01/11/24 by BS under DT criteria.  Pt sitting in chair on my arrival. States pain is more well managed today. Still complaining pump pain. Milrinone  and Amiodarone  discontinued this morning per AHF team.   Plan to complete discharge education and have daughter change dressing tomorrow at 11am.  WBC up to 21.9 Today sputum culture ordered.   Vital signs: Temp:98.3 HR: 80 Doppler Pressure: 98 Automatic BP: 97/84 (90) O2 Sat: 98% 3L [place] Wt: 319>349.2>334.2>335.1>324>316.8>316.1> 319.8 lbs    LVAD interrogation reveals:  Speed: 5600 Flow: 5.0 Power:  4.3w PI:4.1 Alarms: none Events: 30 Hematocrit: 33 Fixed speed: 5500 Low speed limit: 5200  Drive Line: Existing VAD dressing CDI. Drive line anchor secure. Dressing changes MWF. Next dressing change tomorrow 01/19/24 by VAD coordinator or nurse champion only.   Labs:  LDH trend: 274>292>277>253>282  INR trend: 1.2>1.4>1.5>1.6>1.7  Anticoagulation Plan: -INR Goal: 2-2.5 -ASA Dose: none  Blood Products:  - <intra-op/post-op> 01/11/24 - 4 FFP - 625 cc of cell saver  Device: -n/a -Therapies:  Arrythmias: frequent  PVCs in OR  Respiratory:  Extubated 10/31  Infection: 01/18/24: Resp culture>>needs to be collected  Renal:  -CRT: 1.35>1.09>1.28>1.14>0.99  Gtts: Amio 30 mg/hr---OFF Milrinone  0.125 mcg/kg/min--OFF  Adverse Events on VAD:  Patient Education: Discharge education on dressing change teaching to continue with pt and daughter tomorrow at 64  Plan/Recommendations:  1. Please page VAD coordinator for any equipment issues or VAD alarms as well as for any procedures on VAD pts. 2. Daily driveline dressing changes by VAD coordinator or nurse champion  Schuyler Lunger RN, BSN VAD Coordinator 24/7 Pager [phone]

## 2024-01-18 NOTE — Progress Notes (Signed)
 Progress Note  S/p HM 3 on 10/30  WBC up this morning, remains afebrile.  Speed 5600, flow 5.0 LPM this morning.  Says he feels much better today than yesterday.  Breathing feels better and was having gas pain that is improved.  Not eating much due to poor appetite, had BM this morning that was soft.    Exam: - Sitting comfortably in chair on RA - Midline incision CDI, no drainage - Driveline dressing not removed but mild, appropriate tenderness around the site - Extremity edema improving.  Plan: - WBC up but afebrile and no obvious signs of infection.  Will continue to monitor and if he fevers or WBC remains elevated tomorrow will plan to pan-scan - No antibiotics at this time - Coumadin per pharmacy - Diuresis per HF team - OK to transfer to 2C from my standpoint  Con Clunes, MD Cardiothoracic Surgery Pager: [phone]

## 2024-01-18 NOTE — Progress Notes (Addendum)
 Patient ID: DJIMON LUNDSTROM, male   DOB: [birth year], 48 y.o.   MRN: [id number]   Advanced Heart Failure Rounding Note  Chief Complaint: s/p HM3 Implant Subjective:    10/30 S/P HMIII 11/4 Ramp Echo Speed increased 5600  CO-OX stable on Milrinone  0.125 mcg.   Pain better controlled today.   LVAD Interrogation HM III: Speed: 5600 Flow: 4.7 PI:4.4  Power: 4.4. On batteries.    Objective:    Vital Signs:   Temp:  [98 F (36.7 C)-98.9 F (37.2 C)] 98.7 F (37.1 C) (11/06 0349) Pulse Rate:  [62-177] 74 (11/06 0600) Resp:  [16-25] 16 (11/06 0600) BP: (99-141)/(75-112) 116/97 (11/06 0600) SpO2:  [89 %-100 %] 95 % (11/06 0600) Weight:  [145.1 kg] 145.1 kg (11/06 0622) Last BM Date : 01/18/24  Weight change: Filed Weights   01/16/24 0500 01/17/24 0500 01/18/24 0622  Weight: (!) 143.7 kg (!) 143.4 kg (!) 145.1 kg   Intake/Output:  Intake/Output Summary (Last 24 hours) at 01/18/2024 0751 Last data filed at 01/18/2024 0614 Gross per 24 hour  Intake 650.02 ml  Output 2275 ml  Net -1624.98 ml    Physical Exam  General:  Well appearing. Some conversational dyspnea Neck:  JVP difficult to see.  Cor: Mechanical heart sounds with LVAD hum present. Lungs: Clear, diminished bases Driveline: C/D/I; securement device intact and driveline incorporated Extremities: no edema Neuro: alert & oriented x3. Affect pleasant   Telemetry: NSR 60s-80s intt PVCs (Personally reviewed)    Labs: Basic Metabolic Panel: Recent Labs  Lab 01/14/24 0502 01/14/24 0641 01/15/24 0529 01/15/24 0558 01/16/24 0446 01/16/24 0553 01/16/24 1431 01/17/24 0404 01/17/24 0433 01/17/24 1641 01/17/24 2137 01/18/24 0508  NA 126*   < > 127*   < >  --  126* 128* 125* 126* 125*  --  125*  K 3.8   < > 3.2*   < >  --  2.9* 3.4* 3.2* 3.4* 3.7 3.9 3.8  CL 89*  --  84*  --   --  82* 85* 85*  --  87*  --  90*  CO2 26  --  29  --   --  29 29 26   --  22  --  23  GLUCOSE 127*  --  114*  --   --  252* 103* 268*  --   194*  --  115*  BUN 11  --  18  --   --  22* 22* 22*  --  19  --  19  CREATININE 1.07  --  1.19  --   --  1.28* 1.26* 1.14  --  0.98  --  0.99  CALCIUM 8.6*  --  8.7*  --   --  8.1* 8.7* 8.2*  --  8.4*  --  8.9  MG 1.5*  --  1.7  --  2.2  --   --  2.0  --   --   --  2.2  PHOS 3.4  --  3.5  --  3.9  --   --  3.0  --   --   --  4.1   < > = values in this interval not displayed.   Liver Function Tests: Recent Labs  Lab 01/12/24 0500 01/13/24 0357 01/14/24 0502 01/18/24 0508  AST 68* 60* 37 26  ALT 25 21 16 17   ALKPHOS 44 47 54 85  BILITOT 1.5* 1.8* 1.3* 1.0  PROT 5.5* 5.6* 6.2* 6.7  ALBUMIN 3.3* 3.0* 3.0* 2.5*  No results for input(s): LIPASE, AMYLASE in the last 168 hours. No results for input(s): AMMONIA in the last 168 hours.  CBC: Recent Labs  Lab 01/14/24 0502 01/14/24 0641 01/15/24 0529 01/15/24 0558 01/16/24 0446 01/17/24 0404 01/17/24 0433 01/18/24 0508  WBC 16.7*  --  14.0*  --  12.2* 13.4*  --  21.9*  NEUTROABS 12.0*  --  9.4*  --  8.0* 8.8*  --  16.4*  HGB 11.1*   < > 11.2* 12.6* 10.7* 10.1* 11.2* 11.0*  HCT 34.5*   < > 33.7* 37.0* 32.7* 30.9* 33.0* 34.3*  MCV 80.2  --  78.0*  --  79.2* 79.8*  --  80.0  PLT 130*  --  161  --  170 185  --  256   < > = values in this interval not displayed.   BNP (last 3 results) Recent Labs    01/05/24 1907 01/12/24 0500 01/18/24 0508  BNP 674.8* 623.8* 1,193.0*   Medications:    Scheduled Medications:  bisacodyl  10 mg Oral Daily   Or   bisacodyl  10 mg Rectal Daily   Chlorhexidine  Gluconate Cloth  6 each Topical Daily   docusate sodium  200 mg Oral Daily   empagliflozin   10 mg Oral Daily   enoxaparin  (LOVENOX ) injection  40 mg Subcutaneous Daily   ezetimibe   10 mg Oral Daily   feeding supplement  237 mL Oral TID BM   gabapentin  200 mg Oral Daily   gabapentin  400 mg Oral QHS   insulin  aspart  0-24 Units Subcutaneous TID WC   insulin  glargine-yfgn  18 Units Subcutaneous Daily   lidocaine   1 patch  Transdermal Q24H   mexiletine  150 mg Oral Q8H   pantoprazole  40 mg Oral Daily   sodium chloride  flush  10-40 mL Intracatheter Q12H   sodium chloride  flush  3 mL Intravenous Q12H   spironolactone   50 mg Oral Daily   torsemide   40 mg Oral Daily   Warfarin - Pharmacist Dosing Inpatient   Does not apply q1600    Infusions:  amiodarone  30 mg/hr (01/17/24 1944)    PRN Medications: dextrose , morphine  injection, ondansetron  (ZOFRAN ) IV, mouth rinse, oxyCODONE , simethicone , sodium chloride  flush, sodium chloride  flush, traMADol   Plan/Discussion:    1. Chronic systolic heart failure: - combined ischemic and nonischemic: -Mentions of compaction CM from [facility] cards notes. PVCs may also contribute, 25.3% PVCs with NSVT on Zio monitor in 5/25 - RHC 10/25 with low filling pressures and reduced CI (1.9 TD, 2.2 Fick). - NYHA class IV symptoms on admission.  - S/P HM3 LVAD Implant - intra-op TEE with normal RV function (per anesthesia report).  - 11/4 Ramp Echo Speed increased 5600.  - CVP 9, sitting up in chair. Reports some SOB, weight up 3lbs.  CO-OX stable. Lasix  80 IV BID today +diamox 500 x1. Will restart home Torsemide  40 mg daily tomorroow.  - Stop milrinone  today - Continue spiro 50 mg daily - Start digoxin  0.125 mcg - Continue jardiance  10 mg daily  - Continue warfarin. INR 1.7.  - LDH stable  2 CAD:   - LHC 5/24 at [facility]: severe multivessel CAD including 80% mid RCA, 100% RPDA, 100% mid LCx, 80% OM1, 80% mid-distal LAD.  - LHC 9/25: occluded PDA and mid AV LCx, 90% ramus, severe diffuse disease in right PLV, nonobstructive LAD disease. With markedly low EF and without severe LAD disease, CABG nor a durable option.  - No  ASA given anticoagulation use and stable CAD.  - Continue Zetia .   3. PVCs:  - Zio 5/25 with 25.3% (prev did not tolerate amiodarone  well, on mexiletine) - increased burden post-op, now improving - Continue IV amiodarone  until off inotrope support. Should be able  to switch to po amio. Plan to switch later today.  - Continue mexilitine 150 mg every 8 hours.   Tx to PCU today.     CRITICAL CARE Performed by: Beckey LITTIE Coe AGACNP-BC    Total critical care time: 16 minutes  Critical care time was exclusive of separately billable procedures and treating other patients.  Critical care was necessary to treat or prevent imminent or life-threatening deterioration.  Critical care was time spent personally by me on the following activities: development of treatment plan with patient and/or surrogate as well as nursing, discussions with consultants, evaluation of patient's response to treatment, examination of patient, obtaining history from patient or surrogate, ordering and performing treatments and interventions, ordering and review of laboratory studies, ordering and review of radiographic studies, pulse oximetry and re-evaluation of patient's condition.    Length of Stay: 13

## 2024-01-18 NOTE — Progress Notes (Signed)
 PHARMACY - ANTICOAGULATION CONSULT NOTE  Pharmacy Consult for warfarin Indication: LVAD  HM3  Allergies  Allergen Reactions   Lactose Intolerance (Gi) Diarrhea   Statins     myalgias    Patient Measurements: Height: 6' 3 (190.5 cm) Weight: (!) 145.1 kg (319 lb 14.2 oz) (lvad) IBW/kg (Calculated) : 84.5 HEPARIN  DW (KG): 117  Vital Signs: Temp: 97.8 F (36.6 C) (11/06 1119) Temp Source: Oral (11/06 1119) BP: 97/78 (11/06 0800) Pulse Rate: 74 (11/06 0600)  Labs: Recent Labs    01/16/24 0446 01/16/24 0553 01/17/24 0404 01/17/24 0433 01/17/24 1641 01/18/24 0508  HGB 10.7*  --  10.1* 11.2*  --  11.0*  HCT 32.7*  --  30.9* 33.0*  --  34.3*  PLT 170  --  185  --   --  256  LABPROT 18.5*  --  19.9*  --   --  20.5*  INR 1.5*  --  1.6*  --   --  1.7*  CREATININE  --    < > 1.14  --  0.98 0.99   < > = values in this interval not displayed.    Estimated Creatinine Clearance: 141.8 mL/min (by C-G formula based on SCr of 0.99 mg/dL).   Medical History: Past Medical History:  Diagnosis Date   Aortic root dilatation    a. 07/2022 Echo: Ao root 4.4cm, Asc Ao 4.0cm.   Asthma    as a child, no problems as an adult, rarely uses inhaler   CAD (coronary artery disease)    a. 08/2017 Cath: LM nl, LAD 62m/d, D1 70ost, LCX 111m, OM1 90 inf branch, 60 sup branch, OM2 small, RCA 30 diffuse, RPDA 100 w/ L->R collats; b. 07/2022 Echo: LM nl, LAD 30m, D1 80, LCX 143m, OM1 80, RCA 36m, RPDA 100-->No targets for PCI/CABG-->Med rx.   CHF (congestive heart failure) (HCC)    takes lasix    Chronic HFrEF (heart failure with reduced ejection fraction) (HCC)    a. 03/2015 Echo: EF 35-40%; b. 08/2017 Echo: EF 15%; c. 08/2017 cMRI: EF 35%; d. 06/2018 Echo: EF 10-15%; e. 07/2022 Echo: EF 15-20%, grIII DD, mild MR, sev dil LA, mod-sev PH, Ao root 4.4cm, Asc Ao 4.0cm; f. 07/2022 RHC: RA 4, RV 38/3, PA 43/18 (26), PCWP 11. CO/CI 5.3/1.8.   CKD (chronic kidney disease), stage II    Diabetes mellitus without  complication (HCC)    type 2   Dyspnea    with exertion   Gout    Hyperlipidemia LDL goal <70    Hypertension    Ischemic cardiomyopathy    a. 03/2015 Echo: EF 35-40%; b. 08/2017 Echo: EF 15%; c. 08/2017 cMRI: EF 35%; d. 06/2018 Echo: EF 10-15%; e. 07/2022 Echo: EF 15-20%, grIII DD.   IVCD (intraventricular conduction defect)    Left ventricular noncompaction [facility])    Morbid obesity (HCC)    NSVT (nonsustained ventricular tachycardia) (HCC)    a. 09/2022 Event monitor: predominantly sinus rhythm, avg HR 81 (46-167). 17 runs NSVT (fastest/longest 13 beats x 167). Rare PACs, 17.5% PVC burden.   PVC's (premature ventricular contractions)    a. 09/2022 Event Monitor: 17.5% PVC burden.   Statin intolerance    Tobacco abuse     Medications:  Scheduled:   amiodarone   200 mg Oral Daily   bisacodyl  10 mg Oral Daily   Or   bisacodyl  10 mg Rectal Daily   Chlorhexidine  Gluconate Cloth  6 each Topical Daily   digoxin   0.125 mg Oral Daily   docusate sodium  200 mg Oral Daily   empagliflozin   10 mg Oral Daily   enoxaparin  (LOVENOX ) injection  40 mg Subcutaneous Daily   ezetimibe   10 mg Oral Daily   feeding supplement  237 mL Oral TID BM   furosemide   80 mg Intravenous BID   gabapentin  200 mg Oral Daily   gabapentin  400 mg Oral QHS   insulin  aspart  0-24 Units Subcutaneous TID WC   insulin  glargine-yfgn  18 Units Subcutaneous Daily   lidocaine   1 patch Transdermal Q24H   mexiletine  150 mg Oral Q8H   mirtazapine  7.5 mg Oral QHS   pantoprazole  40 mg Oral Daily   potassium chloride   40 mEq Oral Q4H   sodium chloride  flush  10-40 mL Intracatheter Q12H   sodium chloride  flush  3 mL Intravenous Q12H   spironolactone   50 mg Oral Daily   [START ON 01/19/2024] torsemide   40 mg Oral Daily   warfarin  7.5 mg Oral ONCE-1600   Warfarin - Pharmacist Dosing Inpatient   Does not apply q1600    Assessment: 48 yom who underwent HM3 LVAD implantation on 10/30. Of note had recent teeth extraction on  10/27. Was on apixaban  PTA for hx Afib - LD 10/22.   Warfarin started post-op on 10/31 (INR 1.2). INR today up to 1.7 < goal though trend up. Hgb 11 stable plt 200s stable LDH 200s stable. Remains on enox40 while INR < 2 Talked with patient who said appetite ~40% of his normal - but is drinking ensures  Goal of Therapy:  INR 2-2.5 Monitor platelets by anticoagulation protocol: Yes   Plan:  Warfarin 7.5 mg tonight - repeat  Monitor daily INR, CBC, and for s/sx of bleeding    Olam Chalk Pharm.D. CPP, BCPS Clinical Pharmacist [phone] 01/18/2024 12:16 PM   Please check AMION for all [facility] Pharmacy phone numbers After 10:00 PM, call Main Pharmacy [phone]

## 2024-01-18 NOTE — Progress Notes (Signed)
 Physical Therapy Treatment Patient Details Name: Philip Monroe MRN: [id number] DOB: 07/16/1975 Today's Date: 01/18/2024   History of Present Illness Pt is a 48 y.o. male who presented 10/24 for right heart cath with plan for LVAD implantation. S/p dental teeth extraction 10/27. S/p Implantation of HeartMate 3 Left Ventricular Assist Device 10/30 PMH: aortic root dilatation, asthma, CAD, CHF, CKD stage II, gout, DM, HLD, HTN, IVCD, morbid obesity, left ventricular noncompation, NSVT, tobacco abuse.    PT Comments  Progressing towards acute functional goals. Transitioned to RW today with min assist for transfers and gait. More fatigued but safely maintains sternal precautions. Able to teach-back LVAD transitions from batteries<>monitor correctly. VSS throughout on RA. Patient will continue to benefit from skilled physical therapy services to further improve independence with functional mobility.     If plan is discharge home, recommend the following: Assistance with cooking/housework;Assist for transportation;Help with stairs or ramp for entrance;A lot of help with walking and/or transfers;A lot of help with bathing/dressing/bathroom   Can travel by private vehicle        Equipment Recommendations  BSC/3in1;Rollator (4 wheels) (Pending progress)    Recommendations for Other Services Rehab consult     Precautions / Restrictions Precautions Precautions: Sternal;Fall Precaution Booklet Issued: Yes (comment) Recall of Precautions/Restrictions: Impaired Precaution/Restrictions Comments: LVAD Restrictions Weight Bearing Restrictions Per Provider Order: Yes RUE Weight Bearing Per Provider Order: Non weight bearing (cardiac sternal) LUE Weight Bearing Per Provider Order: Non weight bearing (cardiac sternal) Other Position/Activity Restrictions: Cardiac Sternal Precautions     Mobility  Bed Mobility Overal bed mobility: Needs Assistance Bed Mobility: Sit to Supine       Sit to supine:  Contact guard assist, HOB elevated   General bed mobility comments: Educated on technique and managed lines/leads/LVAD at a CGA level. Effortful but able to complete transition from EOB to supine without physical assistance.    Transfers Overall transfer level: Needs assistance Equipment used: Rolling walker (2 wheels) Transfers: Sit to/from Stand Sit to Stand: Min assist           General transfer comment: Min assist for boost to stand from recliner, rocks for momentum, better power-up today. holds pillow for comfort. Min assist to steady initially until transitions to RW for light support    Ambulation/Gait Ambulation/Gait assistance: Contact guard assist, +2 safety/equipment Gait Distance (Feet): 90 Feet Assistive device: Rolling walker (2 wheels) Gait Pattern/deviations: Step-through pattern, Decreased stride length, Drifts right/left, Antalgic, Decreased stance time - right Gait velocity: dec Gait velocity interpretation: <1.31 ft/sec, indicative of household ambulator   General Gait Details: Educated on safe AD use with RW today.  Min assist for RW control. Maintains good upright posture with early cues. Moderately antalgic gait pattern with decreased stance time on Rt due to knee pain but no observable buckling. Cues for sequencing and light RW support to steady. VSS, but more fatigued.   Stairs             Wheelchair Mobility     Tilt Bed    Modified Rankin (Stroke Patients Only)       Balance Overall balance assessment: Needs assistance Sitting-balance support: No upper extremity supported, Feet supported Sitting balance-Leahy Scale: Fair     Standing balance support: No upper extremity supported Standing balance-Leahy Scale: Fair Standing balance comment: CGA standing statically                            Communication Communication  Communication: No apparent difficulties  Cognition Arousal: Alert Behavior During Therapy: WFL for  tasks assessed/performed   PT - Cognitive impairments: No apparent impairments                         Following commands: Intact      Cueing Cueing Techniques: Verbal cues  Exercises      General Comments General comments (skin integrity, edema, etc.): HR 80, BP 100/74, SpO2 96% on RA. LVAD: 5650 RPM, 4.9 LPM, 4.3 W      Pertinent Vitals/Pain Pain Assessment Pain Assessment: Faces Faces Pain Scale: Hurts even more Pain Location: sternum Pain Descriptors / Indicators: Aching, Operative site guarding Pain Intervention(s): Monitored during session, Limited activity within patient's tolerance    Home Living                          Prior Function            PT Goals (current goals can now be found in the care plan section) Acute Rehab PT Goals Patient Stated Goal: Reduce pain, improve strength PT Goal Formulation: With patient Time For Goal Achievement: 01/27/24 Potential to Achieve Goals: Good Progress towards PT goals: Progressing toward goals    Frequency    Min 3X/week      PT Plan      Co-evaluation              AM-PAC PT 6 Clicks Mobility   Outcome Measure  Help needed turning from your back to your side while in a flat bed without using bedrails?: A Little Help needed moving from lying on your back to sitting on the side of a flat bed without using bedrails?: A Little Help needed moving to and from a bed to a chair (including a wheelchair)?: A Little Help needed standing up from a chair using your arms (e.g., wheelchair or bedside chair)?: A Little Help needed to walk in hospital room?: A Little Help needed climbing 3-5 steps with a railing? : Total 6 Click Score: 16    End of Session   Activity Tolerance: Patient tolerated treatment well Patient left: in bed;with call bell/phone within reach;with bed alarm set Nurse Communication: Mobility status PT Visit Diagnosis: Difficulty in walking, not elsewhere classified  (R26.2);Unsteadiness on feet (R26.81);Other abnormalities of gait and mobility (R26.89);Muscle weakness (generalized) (M62.81);Pain Pain - part of body:  (Sternum)     Time: 8846-8778 PT Time Calculation (min) (ACUTE ONLY): 28 min  Charges:    $Gait Training: 8-22 mins $Therapeutic Activity: 8-22 mins PT General Charges $$ ACUTE PT VISIT: 1 Visit                     Philip Monroe, PT, DPT [facility]  Rehabilitation Services Physical Therapist Office: [phone] Website: [email or web address]    Philip Monroe 01/18/2024, 1:40 PM

## 2024-01-18 NOTE — Progress Notes (Signed)
 eLink Physician-Brief Progress Note Patient Name: Philip Monroe DOB: 1975-05-18 MRN: [id number]   Date of Service  01/18/2024  HPI/Events of Note  Gas pains and abdominal bloating  eICU Interventions  simethicone      Intervention Category Minor Interventions: Routine modifications to care plan (e.g. PRN medications for pain, fever)  Ajani Schnieders 01/18/2024, 12:11 AM

## 2024-01-18 NOTE — Plan of Care (Signed)
  Problem: Respiratory: Goal: Will regain and/or maintain adequate ventilation Outcome: Progressing   Problem: Activity: Goal: Risk for activity intolerance will decrease Outcome: Progressing   Problem: Cardiac: Goal: Ability to maintain an adequate cardiac output will improve Outcome: Progressing   Problem: Coping: Goal: Level of anxiety will decrease Outcome: Progressing   Problem: Clinical Measurements: Goal: Ability to maintain clinical measurements within normal limits will improve Outcome: Progressing Goal: Will remain free from infection Outcome: Progressing   Problem: Respiratory: Goal: Will regain and/or maintain adequate ventilation Outcome: Progressing

## 2024-01-18 NOTE — Progress Notes (Addendum)
 NAME:  Philip Monroe, MRN:  [id number], DOB:  1975-06-22, LOS: 13 ADMISSION DATE:  01/05/2024, CONSULTATION DATE: 10/30  REFERRING MD:  MD Su CHIEF COMPLAINT:  LVAD  History of Present Illness:  Patient is a 48 year old male with significant past medical history of chronic HFrEF < 20%, CAD multivessel (recent [facility] 5/24), followed in past by [facility] for iCM/noncompaction CM, pulmonary HTN, HTN, CKD stage III, diabetes type 2, HLD, hx of ETOH use, multiple recent admission of CHF exacerbations- most recent in 9/25 who was admitted on 01/05/24 for HF optimization, and also teeth extraction x 4 (occurred on 10/27) for LVAD surgery on 01/11/24 by MD Su with TCTS. PCCM consulted to assist with post-op vent management as well as critical care needs during ICU stay.    Pertinent  Medical History   Past Medical History:  Diagnosis Date   Aortic root dilatation    a. 07/2022 Echo: Ao root 4.4cm, Asc Ao 4.0cm.   Asthma    as a child, no problems as an adult, rarely uses inhaler   CAD (coronary artery disease)    a. 08/2017 Cath: LM nl, LAD 70m/d, D1 70ost, LCX 1108m, OM1 90 inf branch, 60 sup branch, OM2 small, RCA 30 diffuse, RPDA 100 w/ L->R collats; b. 07/2022 Echo: LM nl, LAD 50m, D1 80, LCX 182m, OM1 80, RCA 20m, RPDA 100-->No targets for PCI/CABG-->Med rx.   CHF (congestive heart failure) (HCC)    takes lasix    Chronic HFrEF (heart failure with reduced ejection fraction) (HCC)    a. 03/2015 Echo: EF 35-40%; b. 08/2017 Echo: EF 15%; c. 08/2017 cMRI: EF 35%; d. 06/2018 Echo: EF 10-15%; e. 07/2022 Echo: EF 15-20%, grIII DD, mild MR, sev dil LA, mod-sev PH, Ao root 4.4cm, Asc Ao 4.0cm; f. 07/2022 RHC: RA 4, RV 38/3, PA 43/18 (26), PCWP 11. CO/CI 5.3/1.8.   CKD (chronic kidney disease), stage II    Diabetes mellitus without complication (HCC)    type 2   Dyspnea    with exertion   Gout    Hyperlipidemia LDL goal <70    Hypertension    Ischemic cardiomyopathy    a. 03/2015 Echo: EF 35-40%; b. 08/2017  Echo: EF 15%; c. 08/2017 cMRI: EF 35%; d. 06/2018 Echo: EF 10-15%; e. 07/2022 Echo: EF 15-20%, grIII DD.   IVCD (intraventricular conduction defect)    Left ventricular noncompaction [facility])    Morbid obesity (HCC)    NSVT (nonsustained ventricular tachycardia) (HCC)    a. 09/2022 Event monitor: predominantly sinus rhythm, avg HR 81 (46-167). 17 runs NSVT (fastest/longest 13 beats x 167). Rare PACs, 17.5% PVC burden.   PVC's (premature ventricular contractions)    a. 09/2022 Event Monitor: 17.5% PVC burden.   Statin intolerance    Tobacco abuse      Significant Hospital Events: Including procedures, antibiotic start and stop dates in addition to other pertinent events   10/24 Admit for HF optimization for LVAD surgery on 10/30 10/27 Teeth Extraction x 4 by oral surgery  10/30 HM3 LVAD placement, PCCM consult for post-op management  11/3 remains on milrinone  0.25, no pressor requirement 11/4 off ino, ambulated with Svo2 drop 11/5 plan for wires and tubes out   Interim History / Subjective:  WBC jump overnight to 21. Otherwise he feels subjectively better than yesterday from breathing standpoint. Still having pain at driveline insertion site. Had bowel movement this morning. No appetite. Plan from AHF to transfer out of ICU today. CCM will  sign off on transfer out.,  Objective    Blood pressure (!) 116/97, pulse 74, temperature 98.3 F (36.8 C), temperature source Oral, resp. rate 16, height 6' 3 (1.905 m), weight (!) 145.1 kg, SpO2 95%. CVP:  [0 mmHg-18 mmHg] 18 mmHg      Intake/Output Summary (Last 24 hours) at 01/18/2024 1045 Last data filed at 01/18/2024 0957 Gross per 24 hour  Intake 577.4 ml  Output 2275 ml  Net -1697.6 ml   Filed Weights   01/16/24 0500 01/17/24 0500 01/18/24 0622  Weight: (!) 143.7 kg (!) 143.4 kg (!) 145.1 kg    General: middle aged male, sitting oob in chair, no distress  HEENT: MM pink/moist, anicteric sclera  Neuro: awake, alert, oriented, non focal   CV: s1s2, paced, LVAD hum, midline sternotomy dressing cdi, well healing  PULM:  rales in bilateral bases, room air, resp even and unlabored, coughing up pink/salmon colored sputum  GI: rounded, soft, LVAD drive line dressing CTI, site looks well healing, TTP at the drive line insertion site  Extremities: warm/dry,  trace edema   Labs and imaging reviewed  LVAD:  5000 rpm  Flow 5L  LDH 282, BNP 1193, coox 70 WBC 21, afebrile   Resolved problem list   Assessment and Plan   Acute hypoxic respiratory failure postoperatively -improving Extubated 10/31, off Ino 11/4 - titrated to room air this morning  - con't IS, aggressive pulmonary toilet, mobilize   Chronic systolic HF s/p LVAD HM III  mvCAD Mild pHTN PVCs with prior poor amio tolerance  Hx HTN  Hx HLD  Hypervolemic hyponatremia -post op, surgical care per CVTS - stop milrinone  today  - AHF on board, appreciate help in management  - LVAD Interrogation shows speed: 5000 Flow: 5  -Ramp study done 11/4 - trend coox, LDH - con't amiodarone  200mg  daily, mexilitine 150mg  q8h, digoxin  0.125mg  daily - con't jardiance  10mg  daily, lasix  80mg  BID, torsemide  40mg  daily, aldactone  50mg  daily  - coudamin, monitor INR  - zetia   - multimodal pain control with tramadol, lidocaine  patch, oxycodone , gabapentin  Leukocytosis  WBC 13.4>21.9 overnight. Afebrile. Pink tenacious sputum production. No foley. Incision sites are well healing but pain at drive line insertion site.  - sputum culture  - watch for 1 more day; if leukocytosis continues tomorrow or development of fever, will need CT CAP to look for infection  - no antibiotics today  CKD II Hypokalemia  - trend bmp, mag, phos - replete elytes - strict I&O - Avoid nephrotoxic agents, renally dose medications - ensure adequate renal perfusion   Type 2 diabetes A1c 6.6 9/17  - cbg q4h  - ssi  - semglee 18U daily   At risk for protein calorie malnutrition  No appetite. He  is eating a few bites. States it is appetite driven, does not feel bloated or nauseated.  - may need appetite stimulant going forward if not improving.  - consider cortrak tomorrow   Morbid obesity -no acute intervention   S/p Teeth Extraction 10/27  -Per oral surgery  Gout  -resume home meds when appropriate   Labs   CBC: Recent Labs  Lab 01/14/24 0502 01/14/24 0641 01/15/24 0529 01/15/24 0558 01/16/24 0446 01/17/24 0404 01/17/24 0433 01/18/24 0508  WBC 16.7*  --  14.0*  --  12.2* 13.4*  --  21.9*  NEUTROABS 12.0*  --  9.4*  --  8.0* 8.8*  --  16.4*  HGB 11.1*   < > 11.2* 12.6* 10.7*  10.1* 11.2* 11.0*  HCT 34.5*   < > 33.7* 37.0* 32.7* 30.9* 33.0* 34.3*  MCV 80.2  --  78.0*  --  79.2* 79.8*  --  80.0  PLT 130*  --  161  --  170 185  --  256   < > = values in this interval not displayed.    Basic Metabolic Panel: Recent Labs  Lab 01/14/24 0502 01/14/24 0641 01/15/24 0529 01/15/24 0558 01/16/24 0446 01/16/24 0553 01/16/24 1431 01/17/24 0404 01/17/24 0433 01/17/24 1641 01/17/24 2137 01/18/24 0508  NA 126*   < > 127*   < >  --  126* 128* 125* 126* 125*  --  125*  K 3.8   < > 3.2*   < >  --  2.9* 3.4* 3.2* 3.4* 3.7 3.9 3.8  CL 89*  --  84*  --   --  82* 85* 85*  --  87*  --  90*  CO2 26  --  29  --   --  29 29 26   --  22  --  23  GLUCOSE 127*  --  114*  --   --  252* 103* 268*  --  194*  --  115*  BUN 11  --  18  --   --  22* 22* 22*  --  19  --  19  CREATININE 1.07  --  1.19  --   --  1.28* 1.26* 1.14  --  0.98  --  0.99  CALCIUM 8.6*  --  8.7*  --   --  8.1* 8.7* 8.2*  --  8.4*  --  8.9  MG 1.5*  --  1.7  --  2.2  --   --  2.0  --   --   --  2.2  PHOS 3.4  --  3.5  --  3.9  --   --  3.0  --   --   --  4.1   < > = values in this interval not displayed.   GFR: Estimated Creatinine Clearance: 141.8 mL/min (by C-G formula based on SCr of 0.99 mg/dL). Recent Labs  Lab 01/15/24 0529 01/16/24 0446 01/17/24 0404 01/18/24 0508  WBC 14.0* 12.2* 13.4* 21.9*     Liver Function Tests: Recent Labs  Lab 01/12/24 0500 01/13/24 0357 01/14/24 0502 01/18/24 0508  AST 68* 60* 37 26  ALT 25 21 16 17   ALKPHOS 44 47 54 85  BILITOT 1.5* 1.8* 1.3* 1.0  PROT 5.5* 5.6* 6.2* 6.7  ALBUMIN 3.3* 3.0* 3.0* 2.5*   No results for input(s): LIPASE, AMYLASE in the last 168 hours. No results for input(s): AMMONIA in the last 168 hours.  ABG    Component Value Date/Time   PHART 7.463 (H) 01/17/2024 0433   PCO2ART 35.8 01/17/2024 0433   PO2ART 81 (L) 01/17/2024 0433   HCO3 25.7 01/17/2024 0433   TCO2 27 01/17/2024 0433   ACIDBASEDEF 2.0 01/13/2024 0407   O2SAT 70.1 01/18/2024 0609     Coagulation Profile: Recent Labs  Lab 01/14/24 0502 01/15/24 0529 01/16/24 0446 01/17/24 0404 01/18/24 0508  INR 1.4* 1.4* 1.5* 1.6* 1.7*    Cardiac Enzymes: No results for input(s): CKTOTAL, CKMB, CKMBINDEX, TROPONINI in the last 168 hours.  HbA1C: Hgb A1c MFr Bld  Date/Time Value Ref Range Status  11/29/2023 04:45 AM 6.6 (H) 4.8 - 5.6 % Final    Comment:    (NOTE) Diagnosis of Diabetes The following HbA1c ranges recommended by  the American Diabetes Association (ADA) may be used as an aid in the diagnosis of diabetes mellitus.  Hemoglobin             Suggested A1C NGSP%              Diagnosis  <5.7                   Non Diabetic  5.7-6.4                Pre-Diabetic  >6.4                   Diabetic  <7.0                   Glycemic control for                       adults with diabetes.      CBG: Recent Labs  Lab 01/17/24 0612 01/17/24 1207 01/17/24 1641 01/17/24 2117 01/18/24 0645  GLUCAP 109* 127* 98 134* 109*   CC time: 32  The patient is critically ill with multiple organ system failure and requires high complexity decision making for assessment and support, frequent evaluation and titration of therapies, advanced monitoring, review of radiographic studies and interpretation of complex data.    Critical Care Time  devoted to patient care services, exclusive of separately billable procedures, described in this note is 32  Tinnie FORBES Adolph DEVONNA [facility] 01/18/24 10:51 AM  Please see [email or web address] for pager details.  From 7A-7P if no response, please call [phone] After hours, please call ELink [phone]

## 2024-01-19 ENCOUNTER — Inpatient Hospital Stay (HOSPITAL_COMMUNITY)

## 2024-01-19 DIAGNOSIS — I5023 Acute on chronic systolic (congestive) heart failure: Secondary | ICD-10-CM | POA: Diagnosis not present

## 2024-01-19 LAB — CBC
HCT: 35.1 % — ABNORMAL LOW (ref 39.0–52.0)
Hemoglobin: 11.3 g/dL — ABNORMAL LOW (ref 13.0–17.0)
MCH: 25.7 pg — ABNORMAL LOW (ref 26.0–34.0)
MCHC: 32.2 g/dL (ref 30.0–36.0)
MCV: 80 fL (ref 80.0–100.0)
Platelets: 349 K/uL (ref 150–400)
RBC: 4.39 MIL/uL (ref 4.22–5.81)
RDW: 20.9 % — ABNORMAL HIGH (ref 11.5–15.5)
WBC: 23.1 K/uL — ABNORMAL HIGH (ref 4.0–10.5)
nRBC: 0 % (ref 0.0–0.2)

## 2024-01-19 LAB — BASIC METABOLIC PANEL WITH GFR
Anion gap: 13 (ref 5–15)
BUN: 24 mg/dL — ABNORMAL HIGH (ref 6–20)
CO2: 21 mmol/L — ABNORMAL LOW (ref 22–32)
Calcium: 8.3 mg/dL — ABNORMAL LOW (ref 8.9–10.3)
Chloride: 92 mmol/L — ABNORMAL LOW (ref 98–111)
Creatinine, Ser: 1.22 mg/dL (ref 0.61–1.24)
GFR, Estimated: >60 mL/min (ref >60)
Glucose, Bld: 102 mg/dL — ABNORMAL HIGH (ref 70–99)
Potassium: 3.7 mmol/L (ref 3.5–5.1)
Sodium: 126 mmol/L — ABNORMAL LOW (ref 135–145)

## 2024-01-19 LAB — EXPECTORATED SPUTUM ASSESSMENT W GRAM STAIN, RFLX TO RESP C

## 2024-01-19 LAB — GLUCOSE, CAPILLARY
Glucose-Capillary: 98 mg/dL (ref 70–99)
Glucose-Capillary: 193 mg/dL — ABNORMAL HIGH (ref 70–99)
Glucose-Capillary: 126 mg/dL — ABNORMAL HIGH (ref 70–99)
Glucose-Capillary: 169 mg/dL — ABNORMAL HIGH (ref 70–99)

## 2024-01-19 LAB — PROTIME-INR
INR: 1.9 — ABNORMAL HIGH (ref 0.8–1.2)
Prothrombin Time: 22.9 s — ABNORMAL HIGH (ref 11.4–15.2)

## 2024-01-19 LAB — COOXEMETRY PANEL
Carboxyhemoglobin: 2.8 % — ABNORMAL HIGH (ref 0.5–1.5)
Methemoglobin: <0.7 % (ref 0.0–1.5)
O2 Saturation: 68 %
Total hemoglobin: 11.4 g/dL — ABNORMAL LOW (ref 12.0–16.0)

## 2024-01-19 LAB — MRSA NEXT GEN BY PCR, NASAL: MRSA by PCR Next Gen: NOT DETECTED

## 2024-01-19 LAB — LACTATE DEHYDROGENASE: LDH: 276 U/L — ABNORMAL HIGH (ref 98–192)

## 2024-01-19 MED ORDER — GABAPENTIN 100 MG PO CAPS
200.0000 mg | ORAL_CAPSULE | Freq: Every day | ORAL | Status: DC
  Administered 2024-01-19 – 2024-01-24 (×6): 200 mg via ORAL
  Filled 2024-01-19 (×6): qty 2

## 2024-01-19 MED ORDER — GABAPENTIN 300 MG PO CAPS
600.0000 mg | ORAL_CAPSULE | Freq: Every day | ORAL | Status: DC
  Administered 2024-01-19 – 2024-01-23 (×5): 600 mg via ORAL
  Filled 2024-01-19 (×5): qty 2

## 2024-01-19 MED ORDER — POTASSIUM CHLORIDE CRYS ER 20 MEQ PO TBCR
20.0000 meq | EXTENDED_RELEASE_TABLET | ORAL | Status: AC
  Administered 2024-01-19 (×3): 20 meq via ORAL
  Filled 2024-01-19 (×3): qty 1

## 2024-01-19 MED ORDER — LINEZOLID 600 MG/300ML IV SOLN
600.0000 mg | Freq: Two times a day (BID) | INTRAVENOUS | Status: DC
  Administered 2024-01-19 – 2024-01-21 (×5): 600 mg via INTRAVENOUS
  Filled 2024-01-19 (×7): qty 300

## 2024-01-19 MED ORDER — WARFARIN SODIUM 7.5 MG PO TABS
7.5000 mg | ORAL_TABLET | Freq: Once | ORAL | Status: AC
  Administered 2024-01-19: 7.5 mg via ORAL
  Filled 2024-01-19: qty 1

## 2024-01-19 MED ORDER — PIPERACILLIN-TAZOBACTAM 3.375 G IVPB
3.3750 g | Freq: Three times a day (TID) | INTRAVENOUS | Status: DC
  Administered 2024-01-19 – 2024-01-24 (×15): 3.375 g via INTRAVENOUS
  Filled 2024-01-19 (×17): qty 50

## 2024-01-19 NOTE — Progress Notes (Signed)
 Occupational Therapy Treatment Patient Details Name: Philip Monroe MRN: [id number] DOB: Apr 10, 1975 Today's Date: 01/19/2024   History of present illness Pt is a 48 y.o. male who presented 10/24 for right heart cath with plan for LVAD implantation. S/p dental teeth extraction 10/27. S/p Implantation of HeartMate 3 Left Ventricular Assist Device 10/30 PMH: aortic root dilatation, asthma, CAD, CHF, CKD stage II, gout, DM, HLD, HTN, IVCD, morbid obesity, left ventricular noncompation, NSVT, tobacco abuse.   OT comments  Pt progressing towards OT goals this session. Spent first half of session focused on ADL's while maintaining sternal precautions. Reviewed move in the tube terminology with specific focus on UB dressing (including holster for batteries), peri care (especially rear peri care), activity tolerance for standing at sink (or placing a chair in bathroom for seated rest breaks). Pt able to demonstrate LLE figure 4 but RLE with pain in knee, and unable to bring it high enough for figure 4 . States his daughter will assist him in that area. Educated on incentive spirometer and able to perform x3 until coughing initiated (uses pillow to stabilize) Pt recalled 3/3 items for go bag and was able to complete power changeover at supervision level. Then Pt was able to complete sit<>stand transfer with min A for balance after utilizing rocking momentum to come to standing from elevated recliner. Pt walked in hallway with RW, requiring multiple standing rest breaks. Pt reports not feeling as well today as yesterday. When OT arrived MD team educating Pt on food/protein/electrolyte intake to assist with feeling a little better. Pt returned supine with GCA of RN and OT.Pt completed power changeover again at supervision level. At this time, even though Pt is moving better and ambulating better rehab of >3 hours daily remains appropriate from an OT perspective as he is still needing significant assist for ADL  especially with adherence to sternal precautions.       If plan is discharge home, recommend the following:  A lot of help with walking and/or transfers;A lot of help with bathing/dressing/bathroom;Assistance with cooking/housework;Direct supervision/assist for medications management;Assist for transportation;Direct supervision/assist for financial management;Help with stairs or ramp for entrance   Equipment Recommendations  Other (comment) (defer)    Recommendations for Other Services      Precautions / Restrictions Precautions Precautions: Sternal;Fall Precaution Booklet Issued: Yes (comment) Recall of Precautions/Restrictions: Impaired Precaution/Restrictions Comments: LVAD Restrictions Weight Bearing Restrictions Per Provider Order: Yes RUE Weight Bearing Per Provider Order: Non weight bearing LUE Weight Bearing Per Provider Order: Non weight bearing Other Position/Activity Restrictions: Cardiac Sternal Precautions       Mobility Bed Mobility Overal bed mobility: Needs Assistance Bed Mobility: Sit to Supine       Sit to supine: Contact guard assist   General bed mobility comments: Educated on technique and managed lines/leads/LVAD at a CGA level. Effortful but able to complete transition from EOB to supine without physical assistance.    Transfers Overall transfer level: Needs assistance Equipment used: Rolling walker (2 wheels) Transfers: Sit to/from Stand Sit to Stand: Min assist           General transfer comment: Min assist for  balance with boost to stand from recliner, rocks for momentum, holds pillow for comfort.     Balance Overall balance assessment: Needs assistance Sitting-balance support: No upper extremity supported, Feet supported Sitting balance-Leahy Scale: Good     Standing balance support: No upper extremity supported, Bilateral upper extremity supported, During functional activity Standing balance-Leahy Scale: Fair Standing balance  comment: CGA standing statically with no UE support                           ADL either performed or assessed with clinical judgement   ADL Overall ADL's : Needs assistance/impaired     Grooming: Wash/dry face;Wash/dry hands;Contact guard assist;Standing Grooming Details (indicate cue type and reason): able to stand without UE support, decreased activity tolerance         Upper Body Dressing : Moderate assistance Upper Body Dressing Details (indicate cue type and reason): extra gown like robe Lower Body Dressing: Moderate assistance Lower Body Dressing Details (indicate cue type and reason): can perform figure 4 with L Leg but not right. will need AE instruction. he states that he can do it sitting in the bed Toilet Transfer: Contact guard assist;Ambulation;Rolling walker (2 wheels)           Functional mobility during ADLs: Contact guard assist;Rolling walker (2 wheels) General ADL Comments: decreased access to lower body. will benefit from AE - needs mod cues for sternal precautions. especially during UB dressing    Extremity/Trunk Assessment Upper Extremity Assessment Upper Extremity Assessment: Overall WFL for tasks assessed (R elbow painful today, but functional)            Vision   Vision Assessment?: No apparent visual deficits   Perception     Praxis     Communication Communication Communication: No apparent difficulties   Cognition Arousal: Alert Behavior During Therapy: WFL for tasks assessed/performed Cognition: No apparent impairments             OT - Cognition Comments: needs mod cues for sternal precaution maintenance.                 Following commands: Intact        Cueing   Cueing Techniques: Verbal cues  Exercises      Shoulder Instructions       General Comments      Pertinent Vitals/ Pain       Pain Assessment Pain Assessment: 0-10 Pain Score: 7  Pain Location: sternum and R knee/elbow Pain Descriptors  / Indicators: Aching, Operative site guarding, Tender, Sore, Constant Pain Intervention(s): Monitored during session, Repositioned  Home Living                                          Prior Functioning/Environment              Frequency  Min 2X/week        Progress Toward Goals  OT Goals(current goals can now be found in the care plan section)  Progress towards OT goals: Progressing toward goals  Acute Rehab OT Goals Patient Stated Goal: be independent, feel better OT Goal Formulation: With patient Time For Goal Achievement: 01/29/24 Potential to Achieve Goals: Good  Plan      Co-evaluation                 AM-PAC OT 6 Clicks Daily Activity     Outcome Measure   Help from another person eating meals?: A Little Help from another person taking care of personal grooming?: A Little Help from another person toileting, which includes using toliet, bedpan, or urinal?: A Lot Help from another person bathing (including washing, rinsing, drying)?: A Lot Help from another person to put on and  taking off regular upper body clothing?: A Lot Help from another person to put on and taking off regular lower body clothing?: A Lot 6 Click Score: 14    End of Session Equipment Utilized During Treatment: Rolling walker (2 wheels)  OT Visit Diagnosis: Unsteadiness on feet (R26.81);Other abnormalities of gait and mobility (R26.89);Muscle weakness (generalized) (M62.81)   Activity Tolerance Patient tolerated treatment well   Patient Left in bed;with call bell/phone within reach;with bed alarm set;with nursing/sitter in room   Nurse Communication Mobility status        Time: 9179-9144 OT Time Calculation (min): 35 min  Charges: OT General Charges $OT Visit: 1 Visit OT Treatments $Self Care/Home Management : 8-22 mins $Therapeutic Activity: 8-22 mins  Leita DEL OTR/L Acute Rehabilitation Services Office: [phone]  Leita PARAS [facility] 01/19/2024,  11:13 AM

## 2024-01-19 NOTE — Progress Notes (Signed)
 H&V Care Navigation CSW Progress Note  Clinical Social Worker met with pt at bedside to check in.  CSW has been communicating with [facility] case worker regarding his case- she will be sending me forms to have him sign while he is in the hospital- informed pt of this.  Pt working with PT and just transferred to [facility]- CSW will follow up next week.  Andriette HILARIO Leech, LCSW Clinical Social Worker [facility] Desk#: [phone] Cell#: [phone]

## 2024-01-19 NOTE — Progress Notes (Signed)
 PHARMACY - ANTICOAGULATION CONSULT NOTE  Pharmacy Consult for warfarin Indication: LVAD  HM3  Allergies  Allergen Reactions   Lactose Intolerance (Gi) Diarrhea   Statins     myalgias    Patient Measurements: Height: 6' 3 (190.5 cm) Weight: (!) 142.1 kg (313 lb 4.4 oz) IBW/kg (Calculated) : 84.5 HEPARIN  DW (KG): 117  Vital Signs: Temp: 97.1 F (36.2 C) (11/07 0700) Temp Source: Axillary (11/07 0700) BP: 113/91 (11/07 0800) Pulse Rate: 81 (11/07 0800)  Labs: Recent Labs    01/17/24 0404 01/17/24 0433 01/17/24 1641 01/18/24 0508 01/19/24 0505 01/19/24 0540  HGB 10.1* 11.2*  --  11.0*  --  11.3*  HCT 30.9* 33.0*  --  34.3*  --  35.1*  PLT 185  --   --  256  --  349  LABPROT 19.9*  --   --  20.5* 22.9*  --   INR 1.6*  --   --  1.7* 1.9*  --   CREATININE 1.14  --  0.98 0.99 1.22  --     Estimated Creatinine Clearance: 113.8 mL/min (by C-G formula based on SCr of 1.22 mg/dL).   Medical History: Past Medical History:  Diagnosis Date   Aortic root dilatation    a. 07/2022 Echo: Ao root 4.4cm, Asc Ao 4.0cm.   Asthma    as a child, no problems as an adult, rarely uses inhaler   CAD (coronary artery disease)    a. 08/2017 Cath: LM nl, LAD 57m/d, D1 70ost, LCX 156m, OM1 90 inf branch, 60 sup branch, OM2 small, RCA 30 diffuse, RPDA 100 w/ L->R collats; b. 07/2022 Echo: LM nl, LAD 63m, D1 80, LCX 132m, OM1 80, RCA 5m, RPDA 100-->No targets for PCI/CABG-->Med rx.   CHF (congestive heart failure) (HCC)    takes lasix    Chronic HFrEF (heart failure with reduced ejection fraction) (HCC)    a. 03/2015 Echo: EF 35-40%; b. 08/2017 Echo: EF 15%; c. 08/2017 cMRI: EF 35%; d. 06/2018 Echo: EF 10-15%; e. 07/2022 Echo: EF 15-20%, grIII DD, mild MR, sev dil LA, mod-sev PH, Ao root 4.4cm, Asc Ao 4.0cm; f. 07/2022 RHC: RA 4, RV 38/3, PA 43/18 (26), PCWP 11. CO/CI 5.3/1.8.   CKD (chronic kidney disease), stage II    Diabetes mellitus without complication (HCC)    type 2   Dyspnea    with  exertion   Gout    Hyperlipidemia LDL goal <70    Hypertension    Ischemic cardiomyopathy    a. 03/2015 Echo: EF 35-40%; b. 08/2017 Echo: EF 15%; c. 08/2017 cMRI: EF 35%; d. 06/2018 Echo: EF 10-15%; e. 07/2022 Echo: EF 15-20%, grIII DD.   IVCD (intraventricular conduction defect)    Left ventricular noncompaction [facility])    Morbid obesity (HCC)    NSVT (nonsustained ventricular tachycardia) (HCC)    a. 09/2022 Event monitor: predominantly sinus rhythm, avg HR 81 (46-167). 17 runs NSVT (fastest/longest 13 beats x 167). Rare PACs, 17.5% PVC burden.   PVC's (premature ventricular contractions)    a. 09/2022 Event Monitor: 17.5% PVC burden.   Statin intolerance    Tobacco abuse     Medications:  Scheduled:   amiodarone   200 mg Oral Daily   bisacodyl  10 mg Oral Daily   Or   bisacodyl  10 mg Rectal Daily   Chlorhexidine  Gluconate Cloth  6 each Topical Daily   digoxin   0.125 mg Oral Daily   docusate sodium  200 mg Oral Daily  empagliflozin   10 mg Oral Daily   ezetimibe   10 mg Oral Daily   feeding supplement  237 mL Oral TID BM   gabapentin  200 mg Oral QAC breakfast   gabapentin  600 mg Oral Q2000   insulin  aspart  0-24 Units Subcutaneous TID WC   insulin  glargine-yfgn  18 Units Subcutaneous Daily   lidocaine   1 patch Transdermal Q24H   mexiletine  150 mg Oral Q8H   mirtazapine  7.5 mg Oral QHS   pantoprazole  40 mg Oral Daily   potassium chloride   20 mEq Oral Q4H   sodium chloride  flush  10-40 mL Intracatheter Q12H   sodium chloride  flush  3 mL Intravenous Q12H   spironolactone   50 mg Oral Daily   torsemide   40 mg Oral Daily   warfarin  7.5 mg Oral ONCE-1600   Warfarin - Pharmacist Dosing Inpatient   Does not apply q1600    Assessment: 48 yom who underwent HM3 LVAD implantation on 10/30. Of note had recent teeth extraction on 10/27. Was on apixaban  PTA for hx Afib - LD 10/22.   Warfarin started post-op on 10/31 (INR 1.2). INR today up to 1.9 slightly < goal - though trend up. Hgb  11 stable plt 200s stable LDH 200s stable. Will stop enox40 while as INR close to goal  Talked with patient who said appetite ~40% of his normal - but is drinking ensures  Goal of Therapy:  INR 2-2.5 Monitor platelets by anticoagulation protocol: Yes   Plan:  Warfarin 7.5 mg tonight - repeat  Stop enoxaparin   Monitor daily INR, CBC, and for s/sx of bleeding    Olam Chalk Pharm.D. CPP, BCPS Clinical Pharmacist [phone] 01/19/2024 10:37 AM   Please check AMION for all [facility] Pharmacy phone numbers After 10:00 PM, call Main Pharmacy [phone]

## 2024-01-19 NOTE — Plan of Care (Signed)
  Problem: Elimination: Goal: Will not experience complications related to bowel motility Outcome: Progressing Goal: Will not experience complications related to urinary retention Outcome: Progressing   Problem: Pain Managment: Goal: General experience of comfort will improve and/or be controlled Outcome: Progressing   Problem: Skin Integrity: Goal: Risk for impaired skin integrity will decrease Outcome: Progressing   Problem: Fluid Volume: Goal: Risk for excess fluid volume will decrease Outcome: Progressing   Problem: Respiratory: Goal: Will regain and/or maintain adequate ventilation Outcome: Progressing

## 2024-01-19 NOTE — Progress Notes (Signed)
 Patient ID: Philip Monroe, male   DOB: 09/02/1975, 48 y.o.   MRN: [id number]   Advanced Heart Failure Rounding Note  Chief Complaint: s/p HM3 Implant Subjective:    10/30 S/P HMIII 11/4 Ramp Echo Speed increased 5600  CO-OX stable off Milrinone .   Pain better controlled today.   Still not eating very well. Remeron added last night. Denies CP. Breathing feels about the same as yesterday.   LVAD Interrogation HM III: Speed: 5600 Flow: 4.9 PI:4.7  Power: 4. x18 PI events this morning.   Objective:    Vital Signs:   Temp:  [97.1 F (36.2 C)-98.5 F (36.9 C)] 97.1 F (36.2 C) (11/07 0700) Pulse Rate:  [46-175] 67 (11/07 0613) Resp:  [16-34] 23 (11/07 0613) BP: (90-163)/(30-106) 106/82 (11/07 0613) SpO2:  [87 %-100 %] 93 % (11/07 0613) Weight:  [142.1 kg] 142.1 kg (11/07 0500) Last BM Date : 01/18/24  Weight change: Filed Weights   01/17/24 0500 01/18/24 0622 01/19/24 0500  Weight: (!) 143.4 kg (!) 145.1 kg (!) 142.1 kg   Intake/Output:  Intake/Output Summary (Last 24 hours) at 01/19/2024 0848 Last data filed at 01/18/2024 2100 Gross per 24 hour  Intake 31.85 ml  Output 1700 ml  Net -1668.15 ml    Physical Exam  General:  Depressed appearing. No resp difficulty Neck:  JVP difficult to see, does not appear elevated.  Cor: Mechanical heart sounds with LVAD hum present. Lungs: Clear, coarse bases Driveline: C/D/I; securement device intact and driveline incorporated Extremities: no edema Neuro: alert & oriented x3. Affect pleasant   Telemetry: NSR 60s-80s. 12-22 PVCs/hr (Personally reviewed)    Labs: Basic Metabolic Panel: Recent Labs  Lab 01/14/24 0502 01/14/24 0641 01/15/24 0529 01/15/24 0558 01/16/24 0446 01/16/24 0553 01/16/24 1431 01/17/24 0404 01/17/24 0433 01/17/24 1641 01/17/24 2137 01/18/24 0508 01/19/24 0505  NA 126*   < > 127*   < >  --    < > 128* 125* 126* 125*  --  125* 126*  K 3.8   < > 3.2*   < >  --    < > 3.4* 3.2* 3.4* 3.7 3.9 3.8 3.7   CL 89*  --  84*  --   --    < > 85* 85*  --  87*  --  90* 92*  CO2 26  --  29  --   --    < > 29 26  --  22  --  23 21*  GLUCOSE 127*  --  114*  --   --    < > 103* 268*  --  194*  --  115* 102*  BUN 11  --  18  --   --    < > 22* 22*  --  19  --  19 24*  CREATININE 1.07  --  1.19  --   --    < > 1.26* 1.14  --  0.98  --  0.99 1.22  CALCIUM 8.6*  --  8.7*  --   --    < > 8.7* 8.2*  --  8.4*  --  8.9 8.3*  MG 1.5*  --  1.7  --  2.2  --   --  2.0  --   --   --  2.2  --   PHOS 3.4  --  3.5  --  3.9  --   --  3.0  --   --   --  4.1  --    < > =  values in this interval not displayed.   Liver Function Tests: Recent Labs  Lab 01/13/24 0357 01/14/24 0502 01/18/24 0508  AST 60* 37 26  ALT 21 16 17   ALKPHOS 47 54 85  BILITOT 1.8* 1.3* 1.0  PROT 5.6* 6.2* 6.7  ALBUMIN 3.0* 3.0* 2.5*   No results for input(s): LIPASE, AMYLASE in the last 168 hours. No results for input(s): AMMONIA in the last 168 hours.  CBC: Recent Labs  Lab 01/14/24 0502 01/14/24 0641 01/15/24 0529 01/15/24 0558 01/16/24 0446 01/17/24 0404 01/17/24 0433 01/18/24 0508 01/19/24 0540  WBC 16.7*  --  14.0*  --  12.2* 13.4*  --  21.9* 23.1*  NEUTROABS 12.0*  --  9.4*  --  8.0* 8.8*  --  16.4*  --   HGB 11.1*   < > 11.2*   < > 10.7* 10.1* 11.2* 11.0* 11.3*  HCT 34.5*   < > 33.7*   < > 32.7* 30.9* 33.0* 34.3* 35.1*  MCV 80.2  --  78.0*  --  79.2* 79.8*  --  80.0 80.0  PLT 130*  --  161  --  170 185  --  256 349   < > = values in this interval not displayed.   BNP (last 3 results) Recent Labs    01/05/24 1907 01/12/24 0500 01/18/24 0508  BNP 674.8* 623.8* 1,193.0*   Medications:    Scheduled Medications:  amiodarone   200 mg Oral Daily   bisacodyl  10 mg Oral Daily   Or   bisacodyl  10 mg Rectal Daily   Chlorhexidine  Gluconate Cloth  6 each Topical Daily   digoxin   0.125 mg Oral Daily   docusate sodium  200 mg Oral Daily   empagliflozin   10 mg Oral Daily   enoxaparin  (LOVENOX ) injection  40 mg  Subcutaneous Daily   ezetimibe   10 mg Oral Daily   feeding supplement  237 mL Oral TID BM   gabapentin  200 mg Oral QAC breakfast   gabapentin  600 mg Oral Q2000   insulin  aspart  0-24 Units Subcutaneous TID WC   insulin  glargine-yfgn  18 Units Subcutaneous Daily   lidocaine   1 patch Transdermal Q24H   mexiletine  150 mg Oral Q8H   mirtazapine  7.5 mg Oral QHS   pantoprazole  40 mg Oral Daily   potassium chloride   20 mEq Oral Q4H   sodium chloride  flush  10-40 mL Intracatheter Q12H   sodium chloride  flush  3 mL Intravenous Q12H   spironolactone   50 mg Oral Daily   torsemide   40 mg Oral Daily   Warfarin - Pharmacist Dosing Inpatient   Does not apply q1600    Infusions:  linezolid (ZYVOX) IV     piperacillin-tazobactam (ZOSYN)  IV      PRN Medications: dextrose , morphine  injection, ondansetron  (ZOFRAN ) IV, mouth rinse, oxyCODONE , simethicone , sodium chloride  flush, sodium chloride  flush, traMADol   Plan/Discussion:    1. Chronic systolic heart failure: - combined ischemic and nonischemic: -Mentions of compaction CM from [facility] cards notes. PVCs may also contribute, 25.3% PVCs with NSVT on Zio monitor in 5/25 - RHC 10/25 with low filling pressures and reduced CI (1.9 TD, 2.2 Fick). - NYHA class IV symptoms on admission.  - S/P HM3 LVAD Implant - intra-op TEE with normal RV function (per anesthesia report).  - 11/4 Ramp Echo Speed increased 5600.  - CVP 8/9, sitting up in chair. Start Torsemide  40 mg daily.  - Stable off milrinone   -  Continue spiro 50 mg daily - Continue digoxin  0.125 mcg - Continue jardiance  10 mg daily  - Continue warfarin. INR 1.9.  - LDH stable  2 CAD:   - LHC 5/24 at [facility]: severe multivessel CAD including 80% mid RCA, 100% RPDA, 100% mid LCx, 80% OM1, 80% mid-distal LAD.  - LHC 9/25: occluded PDA and mid AV LCx, 90% ramus, severe diffuse disease in right PLV, nonobstructive LAD disease. With markedly low EF and without severe LAD disease, CABG nor a  durable option.  - No ASA given anticoagulation use and stable CAD.  - Continue Zetia .   3. PVCs:  - Zio 5/25 with 25.3% (prev did not tolerate amiodarone  well, on mexiletine) - increased burden post-op, now improving - Continue amiodarone  200 mg daily - Continue mexilitine 150 mg every 8 hours. If burden increases can consider increasing this.   4. Decreased appetite Hyponatremia - Suspect in the setting of poor PO intake - Na 126.  - Remeron started 11/6 - Will encourage PO intake by adding Gatorade to meal trays.  - May need a day or two of salt tabs if PO intake does not improve.   Tx to PCU today.     CRITICAL CARE Performed by: Beckey LITTIE Coe AGACNP-BC    Total critical care time: 16 minutes  Critical care time was exclusive of separately billable procedures and treating other patients.  Critical care was necessary to treat or prevent imminent or life-threatening deterioration.  Critical care was time spent personally by me on the following activities: development of treatment plan with patient and/or surrogate as well as nursing, discussions with consultants, evaluation of patient's response to treatment, examination of patient, obtaining history from patient or surrogate, ordering and performing treatments and interventions, ordering and review of laboratory studies, ordering and review of radiographic studies, pulse oximetry and re-evaluation of patient's condition.    Length of Stay: 14

## 2024-01-19 NOTE — Progress Notes (Signed)
 IP rehab admissions - I spoke with patient and his daughter at the bedside today.  He is open to coming to CIR if he needs to come.  Per heart failure team, may be ready Monday or Tuesday.  I will not open insurance case until Monday so that we can see progress and plans.  [phone]

## 2024-01-19 NOTE — Progress Notes (Signed)
 Physical Therapy Treatment Patient Details Name: Philip Monroe MRN: [id number] DOB: 1975/08/10 Today's Date: 01/19/2024   History of Present Illness Pt is a 48 y.o. male who presented 10/24 for right heart cath with plan for LVAD implantation. S/p dental teeth extraction 10/27. S/p Implantation of HeartMate 3 Left Ventricular Assist Device 10/30 PMH: aortic root dilatation, asthma, CAD, CHF, CKD stage II, gout, DM, HLD, HTN, IVCD, morbid obesity, left ventricular noncompation, NSVT, tobacco abuse.    PT Comments  Continues to show great efforts with rehab. CGA for bed mobility, assist with line/lead management. Min assist for transfers from elevated bed surface, education on rollator use and gait training up to 165 feet at CGA level with 3 standing rest breaks to complete distance. Cues for energy conservation, pacing and symptom awareness. VSS throughout on RA. Patient will continue to benefit from skilled physical therapy services to further improve independence with functional mobility.     If plan is discharge home, recommend the following: Assistance with cooking/housework;Assist for transportation;Help with stairs or ramp for entrance;A lot of help with walking and/or transfers;A lot of help with bathing/dressing/bathroom   Can travel by private vehicle        Equipment Recommendations  BSC/3in1;Rollator (4 wheels) (Pending progress)    Recommendations for Other Services Rehab consult     Precautions / Restrictions Precautions Precautions: Sternal;Fall Precaution Booklet Issued: Yes (comment) Recall of Precautions/Restrictions: Impaired Precaution/Restrictions Comments: LVAD Restrictions Weight Bearing Restrictions Per Provider Order: Yes RUE Weight Bearing Per Provider Order: Non weight bearing LUE Weight Bearing Per Provider Order: Non weight bearing Other Position/Activity Restrictions: Cardiac Sternal Precautions     Mobility  Bed Mobility Overal bed mobility: Needs  Assistance Bed Mobility: Supine to Sit     Supine to sit: HOB elevated, Contact guard     General bed mobility comments: CGA for safety, extra time, assist with line management only. Good recall of precautions    Transfers Overall transfer level: Needs assistance Equipment used: Rollator (4 wheels) Transfers: Sit to/from Stand Sit to Stand: Min assist, From elevated surface           General transfer comment: Min assist for boost to stand from elevated bed surface, rocks for momentum. Cues for technique. Hands lightly in lap. Transition to rollator today for light support once upright.    Ambulation/Gait Ambulation/Gait assistance: Contact guard assist, +2 safety/equipment Gait Distance (Feet): 165 Feet Assistive device: Rolling walker (2 wheels) Gait Pattern/deviations: Step-through pattern, Decreased stride length, Drifts right/left, Antalgic, Decreased stance time - right Gait velocity: dec Gait velocity interpretation: <1.31 ft/sec, indicative of household ambulator   General Gait Details: Less antalgic today but still guarding a bit. Educated on rollator use and safety with device. Demonstrates good control. CGA with +2 for safety and equipment management. Required 3 standing rest breaks to complete distance covered. No buckling.   Stairs             Wheelchair Mobility     Tilt Bed    Modified Rankin (Stroke Patients Only)       Balance Overall balance assessment: Needs assistance Sitting-balance support: No upper extremity supported, Feet supported Sitting balance-Leahy Scale: Good     Standing balance support: No upper extremity supported, Bilateral upper extremity supported, During functional activity Standing balance-Leahy Scale: Fair Standing balance comment: CGA standing statically with no UE support  Communication Communication Communication: No apparent difficulties  Cognition Arousal: Alert Behavior  During Therapy: WFL for tasks assessed/performed   PT - Cognitive impairments: No apparent impairments                         Following commands: Intact      Cueing Cueing Techniques: Verbal cues  Exercises      General Comments General comments (skin integrity, edema, etc.): LVAD: 5600 RPM, 5.0 LPM, 4.4 W. VSS on RA throughout.      Pertinent Vitals/Pain Pain Assessment Pain Assessment: Faces Faces Pain Scale: Hurts even more Pain Location: sternum and R knee/elbow Pain Descriptors / Indicators: Aching, Operative site guarding, Tender, Sore, Constant Pain Intervention(s): Limited activity within patient's tolerance, Monitored during session, Repositioned    Home Living                          Prior Function            PT Goals (current goals can now be found in the care plan section) Acute Rehab PT Goals Patient Stated Goal: Reduce pain, improve strength PT Goal Formulation: With patient Time For Goal Achievement: 01/27/24 Potential to Achieve Goals: Good Progress towards PT goals: Progressing toward goals    Frequency    Min 3X/week      PT Plan      Co-evaluation              AM-PAC PT 6 Clicks Mobility   Outcome Measure  Help needed turning from your back to your side while in a flat bed without using bedrails?: A Little Help needed moving from lying on your back to sitting on the side of a flat bed without using bedrails?: A Little Help needed moving to and from a bed to a chair (including a wheelchair)?: A Little Help needed standing up from a chair using your arms (e.g., wheelchair or bedside chair)?: A Little Help needed to walk in hospital room?: A Little Help needed climbing 3-5 steps with a railing? : A Lot 6 Click Score: 17    End of Session   Activity Tolerance: Patient tolerated treatment well Patient left: in bed;with call bell/phone within reach (EOB with nursing staff) Nurse Communication: Mobility  status PT Visit Diagnosis: Difficulty in walking, not elsewhere classified (R26.2);Unsteadiness on feet (R26.81);Other abnormalities of gait and mobility (R26.89);Muscle weakness (generalized) (M62.81);Pain Pain - part of body:  (Sternum)     Time: 8565-8484 PT Time Calculation (min) (ACUTE ONLY): 41 min  Charges:    $Gait Training: 8-22 mins $Therapeutic Activity: 23-37 mins PT General Charges $$ ACUTE PT VISIT: 1 Visit                     Philip Monroe, PT, DPT [facility]  Rehabilitation Services Physical Therapist Office: [phone] Website: [email or web address]    Philip Monroe 01/19/2024, 5:37 PM

## 2024-01-19 NOTE — Progress Notes (Signed)
 LVAD Coordinator Rounding Note:  Admitted 12/25/23 due to CHF and planned teeth extractions prior to VAD implant.   HM3 LVAD implanted on 01/11/24 by BS under DT criteria.  Pt sitting in chair on my arrival. States pain is more well managed today. Still complaining pump pain. Milrinone  and Amiodarone  discontinued this morning per AHF team.   Plan to complete discharge education and have daughter change dressing today at 11am.  WBC up to 23.1 Today sputum culture.   Vital signs: HR: 88 Doppler Pressure: 98 Automatic BP: 106/91 (91) O2 Sat: 93% on RA Wt: 319>349.2>334.2>335.1>324>316.8>316.1> 319.8>313.2 lbs    LVAD interrogation reveals:  Speed: 5600 Flow: 4.7 Power:  4.3w PI: 4.7 Alarms: none Events: 10-20 Hematocrit: 35 Fixed speed: 5500 Low speed limit: 5200  Drive Line: Existing VAD dressing removed and site care performed using sterile technique by daughter Ainaya with VAD coordinator observing. Drive line exit site cleaned with Chlora prep applicators x 2, allowed to dry, and gauze dressing with Silverlon patch applied. Exit site healing and unincorporated, the velour is fully implanted at exit site. 1 suture around red rubber intact. No redness, tenderness, foul odor or rash noted. Small amount of normal fatty necrosis drainage. Drive line anchor re-applied. Dressing changes MWF. Next dressing change tomorrow 01/22/24 by VAD coordinator or nurse champion only.       Labs:  LDH trend: 274>292>277>253>282>276  INR trend: 1.2>1.4>1.5>1.6>1.7>1.9  Anticoagulation Plan: -INR Goal: 2-2.5 -ASA Dose: none  Blood Products:  - <intra-op/post-op> 01/11/24 - 4 FFP - 625 cc of cell saver  Device: -n/a -Therapies:  Arrythmias: frequent  PVCs in OR  Respiratory:  Extubated 10/31  Infection: 01/18/24: Resp culture>>pending  Renal:  -CRT: 1.35>1.09>1.28>1.14>0.99>1.22  Gtts: Amio 30 mg/hr---OFF Milrinone  0.125 mcg/kg/min--OFF  Adverse Events on VAD:  Patient  Education: Educated on [employer], controller functions and all battery changes. Will do emergency procedures, alarms and controller change out on monday  Plan/Recommendations:  1. Please page VAD coordinator for any equipment issues or VAD alarms as well as for any procedures on VAD pts. 2. MWF driveline dressing changes by VAD coordinator or nurse champion  Lauraine Ip RN, BSN VAD Coordinator 24/7 Pager [phone]

## 2024-01-20 DIAGNOSIS — I5043 Acute on chronic combined systolic (congestive) and diastolic (congestive) heart failure: Secondary | ICD-10-CM | POA: Diagnosis not present

## 2024-01-20 LAB — BASIC METABOLIC PANEL WITH GFR
Anion gap: 13 (ref 5–15)
BUN: 29 mg/dL — ABNORMAL HIGH (ref 6–20)
CO2: 21 mmol/L — ABNORMAL LOW (ref 22–32)
Calcium: 8.3 mg/dL — ABNORMAL LOW (ref 8.9–10.3)
Chloride: 94 mmol/L — ABNORMAL LOW (ref 98–111)
Creatinine, Ser: 1.37 mg/dL — ABNORMAL HIGH (ref 0.61–1.24)
GFR, Estimated: >60 mL/min (ref >60)
Glucose, Bld: 87 mg/dL (ref 70–99)
Potassium: 3.6 mmol/L (ref 3.5–5.1)
Sodium: 128 mmol/L — ABNORMAL LOW (ref 135–145)

## 2024-01-20 LAB — COOXEMETRY PANEL
Carboxyhemoglobin: 2.2 % — ABNORMAL HIGH (ref 0.5–1.5)
Methemoglobin: <0.7 % (ref 0.0–1.5)
O2 Saturation: 62.9 %
Total hemoglobin: 11.1 g/dL — ABNORMAL LOW (ref 12.0–16.0)

## 2024-01-20 LAB — URINALYSIS, ROUTINE W REFLEX MICROSCOPIC
Bacteria, UA: NONE SEEN
Bilirubin Urine: NEGATIVE
Glucose, UA: >=500 mg/dL — AB
Hgb urine dipstick: NEGATIVE
Ketones, ur: NEGATIVE mg/dL
Leukocytes,Ua: NEGATIVE
Nitrite: NEGATIVE
Protein, ur: NEGATIVE mg/dL
Specific Gravity, Urine: 1.013 (ref 1.005–1.030)
pH: 5 (ref 5.0–8.0)

## 2024-01-20 LAB — C DIFFICILE (CDIFF) QUICK SCRN (NO PCR REFLEX)
C Diff antigen: NEGATIVE
C Diff interpretation: NOT DETECTED
C Diff toxin: NEGATIVE

## 2024-01-20 LAB — MAGNESIUM: Magnesium: 2.2 mg/dL (ref 1.7–2.4)

## 2024-01-20 LAB — PROTIME-INR
INR: 2.1 — ABNORMAL HIGH (ref 0.8–1.2)
Prothrombin Time: 24.6 s — ABNORMAL HIGH (ref 11.4–15.2)

## 2024-01-20 LAB — CBC
HCT: 32.3 % — ABNORMAL LOW (ref 39.0–52.0)
Hemoglobin: 10.7 g/dL — ABNORMAL LOW (ref 13.0–17.0)
MCH: 25.8 pg — ABNORMAL LOW (ref 26.0–34.0)
MCHC: 33.1 g/dL (ref 30.0–36.0)
MCV: 78 fL — ABNORMAL LOW (ref 80.0–100.0)
Platelets: 375 K/uL (ref 150–400)
RBC: 4.14 MIL/uL — ABNORMAL LOW (ref 4.22–5.81)
RDW: 21.1 % — ABNORMAL HIGH (ref 11.5–15.5)
WBC: 27.1 K/uL — ABNORMAL HIGH (ref 4.0–10.5)
nRBC: 0 % (ref 0.0–0.2)

## 2024-01-20 LAB — GLUCOSE, CAPILLARY
Glucose-Capillary: 131 mg/dL — ABNORMAL HIGH (ref 70–99)
Glucose-Capillary: 146 mg/dL — ABNORMAL HIGH (ref 70–99)
Glucose-Capillary: 109 mg/dL — ABNORMAL HIGH (ref 70–99)
Glucose-Capillary: 106 mg/dL — ABNORMAL HIGH (ref 70–99)

## 2024-01-20 LAB — LACTATE DEHYDROGENASE: LDH: 292 U/L — ABNORMAL HIGH (ref 98–192)

## 2024-01-20 MED ORDER — POTASSIUM CHLORIDE CRYS ER 20 MEQ PO TBCR
20.0000 meq | EXTENDED_RELEASE_TABLET | ORAL | Status: AC
  Administered 2024-01-20 (×3): 20 meq via ORAL
  Filled 2024-01-20 (×3): qty 1

## 2024-01-20 MED ORDER — ACETAMINOPHEN 325 MG PO TABS
650.0000 mg | ORAL_TABLET | Freq: Once | ORAL | Status: AC
  Administered 2024-01-20: 650 mg via ORAL
  Filled 2024-01-20: qty 2

## 2024-01-20 MED ORDER — WARFARIN SODIUM 5 MG PO TABS
5.0000 mg | ORAL_TABLET | Freq: Every day | ORAL | Status: DC
  Administered 2024-01-20 – 2024-01-23 (×4): 5 mg via ORAL
  Filled 2024-01-20 (×4): qty 1

## 2024-01-20 NOTE — Plan of Care (Signed)
  Problem: Activity: Goal: Ability to return to baseline activity level will improve Outcome: Progressing   Problem: Health Behavior/Discharge Planning: Goal: Ability to safely manage health-related needs after discharge will improve Outcome: Progressing   Problem: Education: Goal: Knowledge of General Education information will improve Description: Including pain rating scale, medication(s)/side effects and non-pharmacologic comfort measures Outcome: Progressing   Problem: Activity: Goal: Risk for activity intolerance will decrease Outcome: Progressing   Problem: Skin Integrity: Goal: Risk for impaired skin integrity will decrease Outcome: Progressing   Problem: Clinical Measurements: Goal: Ability to maintain clinical measurements within normal limits will improve Outcome: Progressing   Problem: Cardiac: Goal: Ability to maintain an adequate cardiac output will improve Outcome: Progressing

## 2024-01-20 NOTE — Progress Notes (Signed)
 Patient with run of v-tach over 18 seconds. Went directly into room, patient appears lethargic and distressed. He says he feels like he is going to pass out and his ears are ringing and he is sweating. Cool and clammy. LVAD did not alarm. LVAD coordinator paged. Rapid response called.

## 2024-01-20 NOTE — Progress Notes (Signed)
 Patient ID: Philip Monroe, male   DOB: 1976/01/17, 48 y.o.   MRN: [id number]   Advanced Heart Failure Rounding Note  Chief Complaint: s/p HM3 Implant Subjective:    10/30 S/P HMIII 11/4 Ramp Echo Speed increased 5600  CO-OX 63%.    WBCs up to 27, he is now empirically on linezolid/Zosyn for ?HAP.  UA and C diff negative, pending blood and sputum cultures. Afebrile. MAP stable 80s-90s.   No major complaints today.  Has not walked yet. Denies cough, abdominal pain, diarrhea.   LVAD Interrogation HM III: Speed: 5600 Flow: 5.1 PI: 3.3 Power: 4.4   Objective:    Vital Signs:   Temp:  [97.8 F (36.6 C)-99.4 F (37.4 C)] 98 F (36.7 C) (11/08 1210) Pulse Rate:  [91-98] 98 (11/08 1210) Resp:  [16-17] 16 (11/08 0753) BP: (105-123)/(68-97) 122/68 (11/08 1210) SpO2:  [93 %-98 %] 93 % (11/08 1210) Weight:  [144.2 kg] 144.2 kg (11/08 0620) Last BM Date : 01/19/24 MAP 80s-90s  Weight change: Filed Weights   01/18/24 0622 01/19/24 0500 01/20/24 0620  Weight: (!) 145.1 kg (!) 142.1 kg (!) 144.2 kg   Intake/Output:  Intake/Output Summary (Last 24 hours) at 01/20/2024 1325 Last data filed at 01/19/2024 2330 Gross per 24 hour  Intake 60 ml  Output 700 ml  Net -640 ml    Physical Exam  General: Well appearing this am. NAD.  HEENT: Normal. Neck: Supple, JVP 7-8 cm. Carotids OK.  Cardiac:  Mechanical heart sounds with LVAD hum present.  Lungs:  CTAB, normal effort.  Abdomen:  NT, ND, no HSM. No bruits or masses. +BS  LVAD exit site: Well-healed and incorporated. Dressing dry and intact. No erythema or drainage. Stabilization device present and accurately applied. Driveline dressing changed daily per sterile technique. Extremities:  Warm and dry. No cyanosis, clubbing, rash, or edema.  Neuro:  Alert & oriented x 3. Cranial nerves grossly intact. Moves all 4 extremities w/o difficulty. Affect pleasant    Labs: Basic Metabolic Panel: Recent Labs  Lab 01/14/24 0502 01/14/24 0641  01/15/24 0529 01/15/24 0558 01/16/24 0446 01/16/24 0553 01/17/24 0404 01/17/24 0433 01/17/24 1641 01/17/24 2137 01/18/24 0508 01/19/24 0505 01/20/24 0445  NA 126*   < > 127*   < >  --    < > 125* 126* 125*  --  125* 126* 128*  K 3.8   < > 3.2*   < >  --    < > 3.2* 3.4* 3.7 3.9 3.8 3.7 3.6  CL 89*  --  84*  --   --    < > 85*  --  87*  --  90* 92* 94*  CO2 26  --  29  --   --    < > 26  --  22  --  23 21* 21*  GLUCOSE 127*  --  114*  --   --    < > 268*  --  194*  --  115* 102* 87  BUN 11  --  18  --   --    < > 22*  --  19  --  19 24* 29*  CREATININE 1.07  --  1.19  --   --    < > 1.14  --  0.98  --  0.99 1.22 1.37*  CALCIUM 8.6*  --  8.7*  --   --    < > 8.2*  --  8.4*  --  8.9 8.3* 8.3*  MG 1.5*  --  1.7  --  2.2  --  2.0  --   --   --  2.2  --  2.2  PHOS 3.4  --  3.5  --  3.9  --  3.0  --   --   --  4.1  --   --    < > = values in this interval not displayed.   Liver Function Tests: Recent Labs  Lab 01/14/24 0502 01/18/24 0508  AST 37 26  ALT 16 17  ALKPHOS 54 85  BILITOT 1.3* 1.0  PROT 6.2* 6.7  ALBUMIN 3.0* 2.5*   No results for input(s): LIPASE, AMYLASE in the last 168 hours. No results for input(s): AMMONIA in the last 168 hours.  CBC: Recent Labs  Lab 01/14/24 0502 01/14/24 0641 01/15/24 0529 01/15/24 0558 01/16/24 0446 01/17/24 0404 01/17/24 0433 01/18/24 0508 01/19/24 0540 01/20/24 0630  WBC 16.7*  --  14.0*  --  12.2* 13.4*  --  21.9* 23.1* 27.1*  NEUTROABS 12.0*  --  9.4*  --  8.0* 8.8*  --  16.4*  --   --   HGB 11.1*   < > 11.2*   < > 10.7* 10.1* 11.2* 11.0* 11.3* 10.7*  HCT 34.5*   < > 33.7*   < > 32.7* 30.9* 33.0* 34.3* 35.1* 32.3*  MCV 80.2  --  78.0*  --  79.2* 79.8*  --  80.0 80.0 78.0*  PLT 130*  --  161  --  170 185  --  256 349 375   < > = values in this interval not displayed.   BNP (last 3 results) Recent Labs    01/05/24 1907 01/12/24 0500 01/18/24 0508  BNP 674.8* 623.8* 1,193.0*   Medications:    Scheduled  Medications:  amiodarone   200 mg Oral Daily   bisacodyl  10 mg Oral Daily   Or   bisacodyl  10 mg Rectal Daily   Chlorhexidine  Gluconate Cloth  6 each Topical Daily   digoxin   0.125 mg Oral Daily   docusate sodium  200 mg Oral Daily   empagliflozin   10 mg Oral Daily   ezetimibe   10 mg Oral Daily   feeding supplement  237 mL Oral TID BM   gabapentin  200 mg Oral QAC breakfast   gabapentin  600 mg Oral Q2000   insulin  aspart  0-24 Units Subcutaneous TID WC   insulin  glargine-yfgn  18 Units Subcutaneous Daily   lidocaine   1 patch Transdermal Q24H   mexiletine  150 mg Oral Q8H   mirtazapine  7.5 mg Oral QHS   pantoprazole  40 mg Oral Daily   potassium chloride   20 mEq Oral Q4H   sodium chloride  flush  10-40 mL Intracatheter Q12H   sodium chloride  flush  3 mL Intravenous Q12H   spironolactone   50 mg Oral Daily   torsemide   40 mg Oral Daily   Warfarin - Pharmacist Dosing Inpatient   Does not apply q1600    Infusions:  linezolid (ZYVOX) IV 600 mg (01/20/24 1004)   piperacillin-tazobactam (ZOSYN)  IV 3.375 g (01/20/24 0511)    PRN Medications: dextrose , morphine  injection, ondansetron  (ZOFRAN ) IV, mouth rinse, oxyCODONE , simethicone , sodium chloride  flush, sodium chloride  flush, traMADol   Plan/Discussion:    1. Chronic systolic heart failure: - combined ischemic and nonischemic: -Mentions of compaction CM from [facility] cards notes. PVCs may also contribute, 25.3% PVCs with NSVT on Zio monitor in 5/25 - RHC 10/25 with  low filling pressures and reduced CI (1.9 TD, 2.2 Fick). - NYHA class IV symptoms on admission.  - S/P HM3 LVAD Implant - intra-op TEE with normal RV function (per anesthesia report).  - 11/4 Ramp Echo Speed increased 5600.  - He is not volume overloaded by exam today.  Continue Torsemide  40 mg daily.  - Stable off milrinone , co-ox 63%.  - Continue spiro 50 mg daily - Continue digoxin  0.125 mcg - Continue jardiance  10 mg daily  - Continue warfarin. INR 2.1. - LDH  stable - Repeat ramp echo Monday.   2 CAD:   - LHC 5/24 at [facility]: severe multivessel CAD including 80% mid RCA, 100% RPDA, 100% mid LCx, 80% OM1, 80% mid-distal LAD.  - LHC 9/25: occluded PDA and mid AV LCx, 90% ramus, severe diffuse disease in right PLV, nonobstructive LAD disease. With markedly low EF and without severe LAD disease, CABG nor a durable option.  - No ASA given anticoagulation use and stable CAD.  - Continue Zetia .   3. PVCs:  - Zio 5/25 with 25.3% (prev did not tolerate amiodarone  well, on mexiletine) - increased burden post-op, now improving - Continue amiodarone  200 mg daily - Continue mexilitine 150 mg every 8 hours.  4. Hyponatremia - Mild/stable 128.   5. ID: WBCs [id number] today.  UA and C diff negative, pending blood and sputum cultures.  - Continue linezolid/Zosyn for empiric HAP coverage.   Ezra Shuck 01/20/2024 1:29 PM

## 2024-01-20 NOTE — Significant Event (Addendum)
 Rapid Response Event Note   Reason for Call :  18 sec episode of VT  Initial Focused Assessment:  Pt sitting up in bed with eyes open. He is grunting, c/o 8/10 pain at VAD site. Lungs diminished t/o. Heart tones consistent with VAD hum. ABD large/soft. Skin cool/clammy.  T-98.2, HR-99(SR with frequent PVCS/frequent bigeminy), BP-112/97 via monitor, dopplered MAP-102, RR-22, SpO2-95% on RA.  VAD numbers: Speed:5600, Flow-4.8, Power-4.3, PI-4.6 VAD with low flow alarm  that seems to correlate with VT episode(time is off on VAD). AM labs with K-3.6, Mg-2.2  VAD coordinator notified of events by bedside RN  During this time, pt began to have chills.SABRASABRA?rigors? T-98.7(0), 98.6(Ax)  Interventions:  KCL 20meq PO q4h x 3 doses CBC Morphine  2mg  IV Tylenol  650mg  PO  BC x2  Plan of Care:  VAD coordinator to contact HF MD. Pt given KCL, morphine ,  and tylenol . Await CBC results. Monitor closely. Please call RRT if further assistance needed.   Event Summary:   MD Notified: Chesley, VAD coordinator Call [phone] Arrival Time:0550 End Upfz:[id number]  Tish Graeme Piety, RN

## 2024-01-20 NOTE — Plan of Care (Signed)
   Problem: Education: Goal: Understanding of CV disease, CV risk reduction, and recovery process will improve Outcome: Progressing

## 2024-01-20 NOTE — Progress Notes (Signed)
 PHARMACY - ANTICOAGULATION CONSULT NOTE  Pharmacy Consult for warfarin Indication: LVAD  HM3  Allergies  Allergen Reactions   Lactose Intolerance (Gi) Diarrhea   Statins     myalgias    Patient Measurements: Height: 6' 3 (190.5 cm) Weight: (!) 144.2 kg (317 lb 14.5 oz) IBW/kg (Calculated) : 84.5 HEPARIN  DW (KG): 117  Vital Signs: Temp: 98 F (36.7 C) (11/08 1210) Temp Source: Oral (11/08 1210) BP: 122/68 (11/08 1210) Pulse Rate: 98 (11/08 1210)  Labs: Recent Labs    01/18/24 0508 01/19/24 0505 01/19/24 0540 01/20/24 0445 01/20/24 0630  HGB 11.0*  --  11.3*  --  10.7*  HCT 34.3*  --  35.1*  --  32.3*  PLT 256  --  349  --  375  LABPROT 20.5* 22.9*  --  24.6*  --   INR 1.7* 1.9*  --  2.1*  --   CREATININE 0.99 1.22  --  1.37*  --     Estimated Creatinine Clearance: 102.2 mL/min (A) (by C-G formula based on SCr of 1.37 mg/dL (H)).   Medical History: Past Medical History:  Diagnosis Date   Aortic root dilatation    a. 07/2022 Echo: Ao root 4.4cm, Asc Ao 4.0cm.   Asthma    as a child, no problems as an adult, rarely uses inhaler   CAD (coronary artery disease)    a. 08/2017 Cath: LM nl, LAD 60m/d, D1 70ost, LCX 150m, OM1 90 inf branch, 60 sup branch, OM2 small, RCA 30 diffuse, RPDA 100 w/ L->R collats; b. 07/2022 Echo: LM nl, LAD 26m, D1 80, LCX 165m, OM1 80, RCA 108m, RPDA 100-->No targets for PCI/CABG-->Med rx.   CHF (congestive heart failure) (HCC)    takes lasix    Chronic HFrEF (heart failure with reduced ejection fraction) (HCC)    a. 03/2015 Echo: EF 35-40%; b. 08/2017 Echo: EF 15%; c. 08/2017 cMRI: EF 35%; d. 06/2018 Echo: EF 10-15%; e. 07/2022 Echo: EF 15-20%, grIII DD, mild MR, sev dil LA, mod-sev PH, Ao root 4.4cm, Asc Ao 4.0cm; f. 07/2022 RHC: RA 4, RV 38/3, PA 43/18 (26), PCWP 11. CO/CI 5.3/1.8.   CKD (chronic kidney disease), stage II    Diabetes mellitus without complication (HCC)    type 2   Dyspnea    with exertion   Gout    Hyperlipidemia LDL goal <70     Hypertension    Ischemic cardiomyopathy    a. 03/2015 Echo: EF 35-40%; b. 08/2017 Echo: EF 15%; c. 08/2017 cMRI: EF 35%; d. 06/2018 Echo: EF 10-15%; e. 07/2022 Echo: EF 15-20%, grIII DD.   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 09/2022 Event monitor: predominantly sinus rhythm, avg HR 81 (46-167). 17 runs NSVT (fastest/longest 13 beats x 167). Rare PACs, 17.5% PVC burden.   PVC's (premature ventricular contractions)    a. 09/2022 Event Monitor: 17.5% PVC burden.   Statin intolerance    Tobacco abuse     Medications:  Scheduled:   amiodarone   200 mg Oral Daily   bisacodyl  10 mg Oral Daily   Or   bisacodyl  10 mg Rectal Daily   Chlorhexidine  Gluconate Cloth  6 each Topical Daily   digoxin   0.125 mg Oral Daily   docusate sodium  200 mg Oral Daily   empagliflozin   10 mg Oral Daily   ezetimibe   10 mg  Oral Daily   feeding supplement  237 mL Oral TID BM   gabapentin  200 mg Oral QAC breakfast   gabapentin  600 mg Oral Q2000   insulin  aspart  0-24 Units Subcutaneous TID WC   insulin  glargine-yfgn  18 Units Subcutaneous Daily   lidocaine   1 patch Transdermal Q24H   mexiletine  150 mg Oral Q8H   mirtazapine  7.5 mg Oral QHS   pantoprazole  40 mg Oral Daily   sodium chloride  flush  10-40 mL Intracatheter Q12H   sodium chloride  flush  3 mL Intravenous Q12H   spironolactone   50 mg Oral Daily   torsemide   40 mg Oral Daily   warfarin  5 mg Oral q1600   Warfarin - Pharmacist Dosing Inpatient   Does not apply q1600    Assessment: 48 yom who underwent HM3 LVAD implantation on 10/30. Of note had recent teeth extraction on 10/27. Was on apixaban  PTA for hx Afib - LD 10/22.   Warfarin started post-op on 10/31 (INR 1.2). INR today up to 2.1 at goal  Hgb 11 stable plt 200s stable LDH 200s stable. Stopped enox40 11/7 with INR close to goal  Talked with patient who said  appetite ~40% of his normal - but is drinking ensures  Goal of Therapy:  INR 2-2.5 Monitor platelets by anticoagulation protocol: Yes   Plan:  Warfarin 5 mg daily  Monitor daily INR, CBC, and for s/sx of bleeding    Olam Chalk Pharm.D. CPP, BCPS Clinical Pharmacist [phone] 01/20/2024 3:24 PM   Please check AMION for all [facility] Pharmacy phone numbers After 10:00 PM, call Main Pharmacy [phone]

## 2024-01-21 DIAGNOSIS — I5043 Acute on chronic combined systolic (congestive) and diastolic (congestive) heart failure: Secondary | ICD-10-CM | POA: Diagnosis not present

## 2024-01-21 LAB — GLUCOSE, CAPILLARY
Glucose-Capillary: 99 mg/dL (ref 70–99)
Glucose-Capillary: 141 mg/dL — ABNORMAL HIGH (ref 70–99)
Glucose-Capillary: 112 mg/dL — ABNORMAL HIGH (ref 70–99)
Glucose-Capillary: 167 mg/dL — ABNORMAL HIGH (ref 70–99)

## 2024-01-21 LAB — CBC
HCT: 30.8 % — ABNORMAL LOW (ref 39.0–52.0)
Hemoglobin: 9.9 g/dL — ABNORMAL LOW (ref 13.0–17.0)
MCH: 25.6 pg — ABNORMAL LOW (ref 26.0–34.0)
MCHC: 32.1 g/dL (ref 30.0–36.0)
MCV: 79.6 fL — ABNORMAL LOW (ref 80.0–100.0)
Platelets: 389 K/uL (ref 150–400)
RBC: 3.87 MIL/uL — ABNORMAL LOW (ref 4.22–5.81)
RDW: 21.2 % — ABNORMAL HIGH (ref 11.5–15.5)
WBC: 16.6 K/uL — ABNORMAL HIGH (ref 4.0–10.5)
nRBC: 0 % (ref 0.0–0.2)

## 2024-01-21 LAB — BASIC METABOLIC PANEL WITH GFR
Anion gap: 13 (ref 5–15)
BUN: 31 mg/dL — ABNORMAL HIGH (ref 6–20)
CO2: 21 mmol/L — ABNORMAL LOW (ref 22–32)
Calcium: 8 mg/dL — ABNORMAL LOW (ref 8.9–10.3)
Chloride: 96 mmol/L — ABNORMAL LOW (ref 98–111)
Creatinine, Ser: 1.51 mg/dL — ABNORMAL HIGH (ref 0.61–1.24)
GFR, Estimated: 57 mL/min — ABNORMAL LOW (ref >60)
Glucose, Bld: 85 mg/dL (ref 70–99)
Potassium: 3.7 mmol/L (ref 3.5–5.1)
Sodium: 130 mmol/L — ABNORMAL LOW (ref 135–145)

## 2024-01-21 LAB — COOXEMETRY PANEL
Carboxyhemoglobin: 2.8 % — ABNORMAL HIGH (ref 0.5–1.5)
Methemoglobin: 0.9 % (ref 0.0–1.5)
O2 Saturation: 63.3 %
Total hemoglobin: 9.5 g/dL — ABNORMAL LOW (ref 12.0–16.0)

## 2024-01-21 LAB — PROTIME-INR
INR: 2.3 — ABNORMAL HIGH (ref 0.8–1.2)
Prothrombin Time: 26 s — ABNORMAL HIGH (ref 11.4–15.2)

## 2024-01-21 LAB — MAGNESIUM: Magnesium: 2 mg/dL (ref 1.7–2.4)

## 2024-01-21 LAB — LACTATE DEHYDROGENASE: LDH: 249 U/L — ABNORMAL HIGH (ref 98–192)

## 2024-01-21 MED ORDER — SPIRONOLACTONE 25 MG PO TABS
25.0000 mg | ORAL_TABLET | Freq: Every day | ORAL | Status: DC
  Administered 2024-01-22 – 2024-01-24 (×3): 25 mg via ORAL
  Filled 2024-01-21 (×3): qty 1

## 2024-01-21 NOTE — Progress Notes (Signed)
 PHARMACY - ANTICOAGULATION CONSULT NOTE  Pharmacy Consult for warfarin Indication: LVAD  HM3  Allergies  Allergen Reactions   Lactose Intolerance (Gi) Diarrhea   Statins     myalgias    Patient Measurements: Height: 6' 3 (190.5 cm) Weight: (!) 143 kg (315 lb 4.8 oz) IBW/kg (Calculated) : 84.5 HEPARIN  DW (KG): 117  Vital Signs: Temp: 98.5 F (36.9 C) (11/09 1150) Temp Source: Oral (11/09 1150) BP: 104/77 (11/09 1150) Pulse Rate: 90 (11/09 1150)  Labs: Recent Labs    01/19/24 0505 01/19/24 0540 01/19/24 0540 01/20/24 0445 01/20/24 0630 01/21/24 0510  HGB  --  11.3*   < >  --  10.7* 9.9*  HCT  --  35.1*  --   --  32.3* 30.8*  PLT  --  349  --   --  375 389  LABPROT 22.9*  --   --  24.6*  --  26.0*  INR 1.9*  --   --  2.1*  --  2.3*  CREATININE 1.22  --   --  1.37*  --  1.51*   < > = values in this interval not displayed.    Estimated Creatinine Clearance: 92.3 mL/min (A) (by C-G formula based on SCr of 1.51 mg/dL (H)).   Medical History: Past Medical History:  Diagnosis Date   Aortic root dilatation    a. 07/2022 Echo: Ao root 4.4cm, Asc Ao 4.0cm.   Asthma    as a child, no problems as an adult, rarely uses inhaler   CAD (coronary artery disease)    a. 08/2017 Cath: LM nl, LAD 48m/d, D1 70ost, LCX 115m, OM1 90 inf branch, 60 sup branch, OM2 small, RCA 30 diffuse, RPDA 100 w/ L->R collats; b. 07/2022 Echo: LM nl, LAD 41m, D1 80, LCX 180m, OM1 80, RCA 62m, RPDA 100-->No targets for PCI/CABG-->Med rx.   CHF (congestive heart failure) (HCC)    takes lasix    Chronic HFrEF (heart failure with reduced ejection fraction) (HCC)    a. 03/2015 Echo: EF 35-40%; b. 08/2017 Echo: EF 15%; c. 08/2017 cMRI: EF 35%; d. 06/2018 Echo: EF 10-15%; e. 07/2022 Echo: EF 15-20%, grIII DD, mild MR, sev dil LA, mod-sev PH, Ao root 4.4cm, Asc Ao 4.0cm; f. 07/2022 RHC: RA 4, RV 38/3, PA 43/18 (26), PCWP 11. CO/CI 5.3/1.8.   CKD (chronic kidney disease), stage II    Diabetes mellitus without  complication (HCC)    type 2   Dyspnea    with exertion   Gout    Hyperlipidemia LDL goal <70    Hypertension    Ischemic cardiomyopathy    a. 03/2015 Echo: EF 35-40%; b. 08/2017 Echo: EF 15%; c. 08/2017 cMRI: EF 35%; d. 06/2018 Echo: EF 10-15%; e. 07/2022 Echo: EF 15-20%, grIII DD.   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 09/2022 Event monitor: predominantly sinus rhythm, avg HR 81 (46-167). 17 runs NSVT (fastest/longest 13 beats x 167). Rare PACs, 17.5% PVC burden.   PVC's (premature ventricular contractions)    a. 09/2022 Event Monitor: 17.5% PVC burden.   Statin intolerance    Tobacco abuse     Medications:  Scheduled:   amiodarone   200 mg Oral Daily   bisacodyl  10 mg Oral Daily   Or   bisacodyl  10 mg Rectal Daily   Chlorhexidine  Gluconate Cloth  6 each Topical  Daily   digoxin   0.125 mg Oral Daily   docusate sodium  200 mg Oral Daily   empagliflozin   10 mg Oral Daily   ezetimibe   10 mg Oral Daily   feeding supplement  237 mL Oral TID BM   gabapentin  200 mg Oral QAC breakfast   gabapentin  600 mg Oral Q2000   insulin  aspart  0-24 Units Subcutaneous TID WC   insulin  glargine-yfgn  18 Units Subcutaneous Daily   lidocaine   1 patch Transdermal Q24H   mexiletine  150 mg Oral Q8H   mirtazapine  7.5 mg Oral QHS   pantoprazole  40 mg Oral Daily   sodium chloride  flush  10-40 mL Intracatheter Q12H   sodium chloride  flush  3 mL Intravenous Q12H   [START ON 01/22/2024] spironolactone   25 mg Oral Daily   warfarin  5 mg Oral q1600   Warfarin - Pharmacist Dosing Inpatient   Does not apply q1600    Assessment: 48 yom who underwent HM3 LVAD implantation on 10/30. Of note had recent teeth extraction on 10/27. Was on apixaban  PTA for hx Afib - LD 10/22.   Warfarin started post-op on 10/31 (INR 1.2). INR today up to 2.3 at goal  Hgb 11 stable plt 200s stable LDH  200s stable. Stopped enox40 11/7 with INR close to goal  Talked with patient who said appetite ~40% of his normal - but is drinking ensures  Goal of Therapy:  INR 2-2.5 Monitor platelets by anticoagulation protocol: Yes   Plan:  Warfarin 5 mg daily  Monitor daily INR, CBC, and for s/sx of bleeding    Olam Chalk Pharm.D. CPP, BCPS Clinical Pharmacist [phone] 01/21/2024 3:23 PM   Please check AMION for all [facility] Pharmacy phone numbers After 10:00 PM, call Main Pharmacy [phone]

## 2024-01-21 NOTE — Progress Notes (Signed)
 Patient ID: Philip Monroe, male   DOB: April 09, 1975, 48 y.o.   MRN: [id number]   Advanced Heart Failure Rounding Note  Chief Complaint: s/p HM3 Implant Subjective:    10/30 S/P HMIII 11/4 Ramp Echo Speed increased 5600  CO-OX 63%.    WBC now down to 16, feeling and looking much better today. He overall reports that his appetite is improved, cough is better as well.   LVAD Interrogation HM III: Speed: 5600 Flow: 5 PI: 3.6 Power: 4, few PI events  Objective:    Vital Signs:   Temp:  [98.2 F (36.8 C)-98.6 F (37 C)] 98.5 F (36.9 C) (11/09 1150) Pulse Rate:  [85-95] 90 (11/09 1150) Resp:  [17-19] 18 (11/09 1150) BP: (91-110)/(64-78) 104/77 (11/09 1150) SpO2:  [93 %-95 %] 95 % (11/09 1150) Weight:  [143 kg] 143 kg (11/09 0528) Last BM Date : 01/19/24 MAP 80s-90s  Weight change: Filed Weights   01/19/24 0500 01/20/24 0620 01/21/24 0528  Weight: (!) 142.1 kg (!) 144.2 kg (!) 143 kg   Intake/Output:  Intake/Output Summary (Last 24 hours) at 01/21/2024 1414 Last data filed at 01/21/2024 0815 Gross per 24 hour  Intake 960 ml  Output 475 ml  Net 485 ml    Physical Exam  General:  Well appearing. No resp difficulty Cor: Mechanical heart sounds with LVAD hum present. JVP flat, trace edema Lungs: Normal WOB Abdomen: soft, nontender, nondistended.  Driveline: C/D/I; securement device intact and driveline incorporated Neuro: alert & orientedx3, cranial nerves grossly intact. moves all 4 extremities w/o difficulty.    Labs: Basic Metabolic Panel: Recent Labs  Lab 01/15/24 0529 01/15/24 0558 01/16/24 0446 01/16/24 0553 01/17/24 0404 01/17/24 0433 01/17/24 1641 01/17/24 2137 01/18/24 0508 01/19/24 0505 01/20/24 0445 01/21/24 0510  NA 127*   < >  --    < > 125*   < > 125*  --  125* 126* 128* 130*  K 3.2*   < >  --    < > 3.2*   < > 3.7 3.9 3.8 3.7 3.6 3.7  CL 84*  --   --    < > 85*  --  87*  --  90* 92* 94* 96*  CO2 29  --   --    < > 26  --  22  --  23 21* 21* 21*   GLUCOSE 114*  --   --    < > 268*  --  194*  --  115* 102* 87 85  BUN 18  --   --    < > 22*  --  19  --  19 24* 29* 31*  CREATININE 1.19  --   --    < > 1.14  --  0.98  --  0.99 1.22 1.37* 1.51*  CALCIUM 8.7*  --   --    < > 8.2*  --  8.4*  --  8.9 8.3* 8.3* 8.0*  MG 1.7  --  2.2  --  2.0  --   --   --  2.2  --  2.2 2.0  PHOS 3.5  --  3.9  --  3.0  --   --   --  4.1  --   --   --    < > = values in this interval not displayed.   Liver Function Tests: Recent Labs  Lab 01/18/24 0508  AST 26  ALT 17  ALKPHOS 85  BILITOT 1.0  PROT 6.7  ALBUMIN 2.5*   No results for input(s): LIPASE, AMYLASE in the last 168 hours. No results for input(s): AMMONIA in the last 168 hours.  CBC: Recent Labs  Lab 01/15/24 0529 01/15/24 0558 01/16/24 0446 01/17/24 0404 01/17/24 0433 01/18/24 0508 01/19/24 0540 01/20/24 0630 01/21/24 0510  WBC 14.0*  --  12.2* 13.4*  --  21.9* 23.1* 27.1* 16.6*  NEUTROABS 9.4*  --  8.0* 8.8*  --  16.4*  --   --   --   HGB 11.2*   < > 10.7* 10.1* 11.2* 11.0* 11.3* 10.7* 9.9*  HCT 33.7*   < > 32.7* 30.9* 33.0* 34.3* 35.1* 32.3* 30.8*  MCV 78.0*  --  79.2* 79.8*  --  80.0 80.0 78.0* 79.6*  PLT 161  --  170 185  --  256 349 375 389   < > = values in this interval not displayed.   BNP (last 3 results) Recent Labs    01/05/24 1907 01/12/24 0500 01/18/24 0508  BNP 674.8* 623.8* 1,193.0*   Medications:    Scheduled Medications:  amiodarone   200 mg Oral Daily   bisacodyl  10 mg Oral Daily   Or   bisacodyl  10 mg Rectal Daily   Chlorhexidine  Gluconate Cloth  6 each Topical Daily   digoxin   0.125 mg Oral Daily   docusate sodium  200 mg Oral Daily   empagliflozin   10 mg Oral Daily   ezetimibe   10 mg Oral Daily   feeding supplement  237 mL Oral TID BM   gabapentin  200 mg Oral QAC breakfast   gabapentin  600 mg Oral Q2000   insulin  aspart  0-24 Units Subcutaneous TID WC   insulin  glargine-yfgn  18 Units Subcutaneous Daily   lidocaine   1 patch  Transdermal Q24H   mexiletine  150 mg Oral Q8H   mirtazapine  7.5 mg Oral QHS   pantoprazole  40 mg Oral Daily   sodium chloride  flush  10-40 mL Intracatheter Q12H   sodium chloride  flush  3 mL Intravenous Q12H   [START ON 01/22/2024] spironolactone   25 mg Oral Daily   warfarin  5 mg Oral q1600   Warfarin - Pharmacist Dosing Inpatient   Does not apply q1600    Infusions:  linezolid (ZYVOX) IV 600 mg (01/21/24 1036)   piperacillin-tazobactam (ZOSYN)  IV 3.375 g (01/21/24 0528)    PRN Medications: dextrose , morphine  injection, ondansetron  (ZOFRAN ) IV, mouth rinse, oxyCODONE , simethicone , sodium chloride  flush, sodium chloride  flush, traMADol   Plan/Discussion:    1. Chronic systolic heart failure: - combined ischemic and nonischemic: -Mentions of compaction CM from [facility] cards notes. PVCs may also contribute, 25.3% PVCs with NSVT on Zio monitor in 5/25 - RHC 10/25 with low filling pressures and reduced CI (1.9 TD, 2.2 Fick). - NYHA class IV symptoms on admission.  - S/P HM3 LVAD Implant - intra-op TEE with normal RV function (per anesthesia report).  - 11/4 Ramp Echo Speed increased 5600.  - Appears dry, rising creatinine. Hold lasix , increase solute intake with Gatorade today - Stable off milrinone , co-ox 63%.  - Decrease spironolactone  to 25 - Continue digoxin  0.125 mcg - Continue jardiance  10 mg daily  - Continue warfarin. INR 2.3, goal 2-2.5 - LDH 249 - Repeat ramp echo Monday.   2 CAD:   - LHC 5/24 at [facility]: severe multivessel CAD including 80% mid RCA, 100% RPDA, 100% mid LCx, 80% OM1, 80% mid-distal LAD.  - LHC 9/25: occluded PDA and mid AV  LCx, 90% ramus, severe diffuse disease in right PLV, nonobstructive LAD disease. With markedly low EF and without severe LAD disease, CABG nor a durable option.  - No ASA given anticoagulation use and stable CAD.  - Continue Zetia .   3. PVCs:  - Zio 5/25 with 25.3% (prev did not tolerate amiodarone  well, on mexiletine) - increased  burden post-op, now improved - Continue amiodarone  200 mg daily - Continue mexilitine 150 mg every 8 hours.  4. Hyponatremia - Mild/stable 128.   5. ID: WBCs down to 16K today.  UA and C diff negative, pending blood and sputum cultures but NGTD. - Stop zyvox given negative MRSA swab - Continue zosyn for empiric HAP coverage, improving exam.   Morene JINNY Brownie 01/21/2024 2:14 PM

## 2024-01-21 NOTE — Progress Notes (Signed)
 ABG not needed at this time per RN.

## 2024-01-21 NOTE — Progress Notes (Unsigned)
 Patient ID: Philip Monroe                 DOB: 1975/12/27                    MRN: [id number]      HPI: Philip Monroe is a 48 y.o. male patient referred to lipid clinic by  Dr. Ezra Shuck. 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  Patient reports to clinic today. Last LDL on 12/08/23 was 130.  Reviewed options for lowering LDL cholesterol, including ezetimibe , PCSK-9 inhibitors, bempedoic acid and inclisiran.  Discussed mechanisms of action, dosing, side effects and potential decreases in LDL cholesterol.  Also reviewed cost information and potential options for patient assistance.  Current Medications: ezetimibe  10 mg daily (reported not taking on 11/24/23) Intolerances: statins (simvastatin  20 mg per chart review) - myalgias Risk Factors: HTN, obesity LDL-C goal: <70 ApoB goal:   Diet:   Exercise:   Family History:   Social History:  Former smoker (quit ~8 years ago) No longer uses EtOH (history of 36 standard drinks of alcohol per week)  Labs: Lipid Panel     Component Value Date/Time   CHOL 190 12/08/2023 1131   TRIG 26 01/12/2024 0500   HDL 39 (L) 12/08/2023 1131   CHOLHDL 4.9 12/08/2023 1131   VLDL 21 12/08/2023 1131   LDLCALC 130 (H) 12/08/2023 1131    Past Medical History:  Diagnosis Date   Aortic root dilatation    a. 07/2022 Echo: Ao root 4.4cm, Asc Ao 4.0cm.   Asthma    as a child, no problems as an adult, rarely uses inhaler   CAD (coronary artery disease)    a. 08/2017 Cath: LM nl, LAD 41m/d, D1 70ost, LCX 19m, OM1 90 inf branch, 60 sup branch, OM2 small, RCA 30 diffuse, RPDA 100 w/ L->R collats; b. 07/2022 Echo: LM nl, LAD 91m, D1 80, LCX 153m, OM1 80, RCA 27m, RPDA 100-->No targets for PCI/CABG-->Med rx.   CHF  (congestive heart failure) (HCC)    takes lasix    Chronic HFrEF (heart failure with reduced ejection fraction) (HCC)    a. 03/2015 Echo: EF 35-40%; b. 08/2017 Echo: EF 15%; c. 08/2017 cMRI: EF 35%; d. 06/2018 Echo: EF 10-15%; e. 07/2022 Echo: EF 15-20%, grIII DD, mild MR, sev dil LA, mod-sev PH, Ao root 4.4cm, Asc Ao 4.0cm; f. 07/2022 RHC: RA 4, RV 38/3, PA 43/18 (26), PCWP 11. CO/CI 5.3/1.8.   CKD (chronic kidney disease), stage II    Diabetes mellitus without complication (HCC)    type 2   Dyspnea    with exertion   Gout    Hyperlipidemia LDL goal <70    Hypertension    Ischemic cardiomyopathy    a. 03/2015 Echo: EF 35-40%; b. 08/2017 Echo: EF 15%; c. 08/2017 cMRI: EF 35%; d. 06/2018 Echo: EF 10-15%; e. 07/2022 Echo: EF 15-20%, grIII DD.   IVCD (intraventricular conduction defect)    Left ventricular noncompaction [facility])    Morbid obesity (HCC)    NSVT (nonsustained ventricular tachycardia) (HCC)    a. 09/2022 Event monitor: predominantly sinus rhythm, avg HR 81 (46-167). 17 runs NSVT (fastest/longest 13 beats x 167). Rare PACs, 17.5% PVC burden.   PVC's (premature ventricular contractions)    a.  09/2022 Event Monitor: 17.5% PVC burden.   Statin intolerance    Tobacco abuse     Current Facility-Administered Medications on File Prior to Visit  Medication Dose Route Frequency Provider Last Rate Last Admin   amiodarone  (PACERONE ) tablet 200 mg  200 mg Oral Daily Hayes, Alma L, NP   200 mg at 01/21/24 1020   bisacodyl (DULCOLAX) EC tablet 10 mg  10 mg Oral Daily Su, Bailey S, MD   10 mg at 01/18/24 9046   Or   bisacodyl (DULCOLAX) suppository 10 mg  10 mg Rectal Daily Su, Bailey S, MD       Chlorhexidine  Gluconate Cloth 2 % PADS 6 each  6 each Topical Daily Daniel Con RAMAN, MD   6 each at 01/21/24 1020   dextrose  50 % solution 0-50 mL  0-50 mL Intravenous PRN Su, Bailey S, MD       digoxin  (LANOXIN ) tablet 0.125 mg  0.125 mg Oral Daily Hayes, Alma L, NP   0.125 mg at 01/21/24 1020   docusate sodium  (COLACE) capsule 200 mg  200 mg Oral Daily Daniel Con RAMAN, MD   200 mg at 01/18/24 9046   empagliflozin  (JARDIANCE ) tablet 10 mg  10 mg Oral Daily Stoner, Benjamin J, MD   10 mg at 01/21/24 1020   ezetimibe  (ZETIA ) tablet 10 mg  10 mg Oral Daily Clegg, Amy D, NP   10 mg at 01/21/24 1020   feeding supplement (ENSURE PLUS HIGH PROTEIN) liquid 237 mL  237 mL Oral TID BM Gretta Doffing P, DO   237 mL at 01/21/24 1020   gabapentin (NEURONTIN) capsule 200 mg  200 mg Oral QAC breakfast Su, Bailey S, MD   200 mg at 01/21/24 1020   gabapentin (NEURONTIN) capsule 600 mg  600 mg Oral Q2000 Su, Bailey S, MD   600 mg at 01/20/24 2138   insulin  aspart (novoLOG ) injection 0-24 Units  0-24 Units Subcutaneous TID WC Daniel Con RAMAN, MD   2 Units at 01/21/24 1232   insulin  glargine-yfgn (SEMGLEE) injection 18 Units  18 Units Subcutaneous Daily Bowser, Grace E, NP   18 Units at 01/21/24 1021   lidocaine  (LIDODERM ) 5 % 1 patch  1 patch Transdermal Q24H Clegg, Amy D, NP   1 patch at 01/21/24 1020   mexiletine (MEXITIL ) capsule 150 mg  150 mg Oral Q8H Lee, Jordan, NP   150 mg at 01/21/24 1630   mirtazapine (REMERON) tablet 7.5 mg  7.5 mg Oral QHS Autry, Lauren E, PA-C   7.5 mg at 01/20/24 2138   morphine  (PF) 2 MG/ML injection 1-4 mg  1-4 mg Intravenous Q1H PRN Daniel Con RAMAN, MD   2 mg at 01/20/24 0558   ondansetron  (ZOFRAN ) injection 4 mg  4 mg Intravenous Q6H PRN Daniel Con RAMAN, MD       Oral care mouth rinse  15 mL Mouth Rinse PRN Daniel Con RAMAN, MD       oxyCODONE  (Oxy IR/ROXICODONE ) immediate release tablet 10-15 mg  10-15 mg Oral Q3H PRN Gleason, Laura R, PA-C   15 mg at 01/21/24 1039   pantoprazole (PROTONIX) EC tablet 40 mg  40 mg Oral Daily Su, Bailey S, MD   40 mg at 01/21/24 1020   piperacillin-tazobactam (ZOSYN) IVPB 3.375 g  3.375 g Intravenous Q8H Gretta Doffing P, DO 12.5 mL/hr at 01/21/24 1631 3.375 g at 01/21/24 1631   simethicone  (MYLICON) chewable tablet 80 mg  80 mg Oral QID PRN Paliwal,  Aditya, MD   80 mg at  01/20/24 0929   sodium chloride  flush (NS) 0.9 % injection 10-40 mL  10-40 mL Intracatheter Q12H Daniel Con RAMAN, MD   10 mL at 01/21/24 1021   sodium chloride  flush (NS) 0.9 % injection 10-40 mL  10-40 mL Intracatheter PRN Daniel Con RAMAN, MD       sodium chloride  flush (NS) 0.9 % injection 3 mL  3 mL Intravenous Q12H Su, Bailey S, MD   3 mL at 01/21/24 1021   sodium chloride  flush (NS) 0.9 % injection 3 mL  3 mL Intravenous PRN Daniel Con RAMAN, MD       [START ON 01/22/2024] spironolactone  (ALDACTONE ) tablet 25 mg  25 mg Oral Daily Stoner, Benjamin J, MD       traMADol DANNY) tablet 50-100 mg  50-100 mg Oral Q4H PRN Daniel Con RAMAN, MD   100 mg at 01/18/24 1742   warfarin (COUMADIN) tablet 5 mg  5 mg Oral q1600 Daniel Con RAMAN, MD   5 mg at 01/21/24 1631   Warfarin - Pharmacist Dosing Inpatient   Does not apply q1600 Daniel Con RAMAN, MD   Given at 01/19/24 1724   Current Outpatient Medications on File Prior to Visit  Medication Sig Dispense Refill   albuterol  (VENTOLIN  HFA) 108 (90 Base) MCG/ACT inhaler Inhale 1-2 puffs into the lungs every 6 (six) hours as needed for wheezing or shortness of breath.     allopurinol  (ZYLOPRIM ) 100 MG tablet Take 1 tablet (100 mg total) by mouth daily. 30 tablet 2   colchicine  0.6 MG tablet Take 1 tablet (0.6 mg total) by mouth 2 (two) times daily. 60 tablet 6   digoxin  (LANOXIN ) 0.125 MG tablet Take 1 tablet (0.125 mg total) by mouth daily. 30 tablet 1   ELIQUIS  5 MG TABS tablet Take 1 tablet (5 mg total) by mouth 2 (two) times daily. (Patient not taking: Reported on 01/04/2024) 60 tablet 1   empagliflozin  (JARDIANCE ) 10 MG TABS tablet Take 1 tablet (10 mg total) by mouth daily. 30 tablet 1   ezetimibe  (ZETIA ) 10 MG tablet Take 1 tablet (10 mg total) by mouth at bedtime. 90 tablet 0   mexiletine (MEXITIL ) 150 MG capsule Take 2 capsules (300 mg total) by mouth 2 (two) times daily. 120 capsule 0   sacubitril -valsartan  (ENTRESTO ) 24-26 MG Take 1 tablet by mouth 2 (two) times  daily. 60 tablet 6   spironolactone  (ALDACTONE ) 25 MG tablet Take 1 tablet (25 mg total) by mouth daily. 30 tablet 1   torsemide  (DEMADEX ) 20 MG tablet Take 2 tablets (40 mg total) by mouth daily. 30 tablet 1    Allergies  Allergen Reactions   Lactose Intolerance (Gi) Diarrhea   Statins     myalgias    Assessment/Plan:  1. Hyperlipidemia -  No problem-specific Assessment & Plan notes found for this encounter.    Thank you,  Melissa D Maccia, Pharm.JONETTA SARAN, CPP [facility] of [facility] [address], [place], [place] [address]  Phone: [phone]; Fax: [phone]

## 2024-01-21 NOTE — Plan of Care (Signed)
  Problem: Activity: Goal: Ability to return to baseline activity level will improve Outcome: Progressing   Problem: Cardiovascular: Goal: Ability to achieve and maintain adequate cardiovascular perfusion will improve Outcome: Progressing   Problem: Education: Goal: Knowledge of General Education information will improve Description: Including pain rating scale, medication(s)/side effects and non-pharmacologic comfort measures Outcome: Progressing   Problem: Clinical Measurements: Goal: Ability to maintain clinical measurements within normal limits will improve Outcome: Progressing   Problem: Nutrition: Goal: Adequate nutrition will be maintained Outcome: Progressing   Problem: Pain Managment: Goal: General experience of comfort will improve and/or be controlled Outcome: Progressing

## 2024-01-21 NOTE — Plan of Care (Signed)
   Problem: Education: Goal: Understanding of CV disease, CV risk reduction, and recovery process will improve Outcome: Progressing

## 2024-01-22 ENCOUNTER — Ambulatory Visit: Payer: MEDICAID | Admitting: Pharmacist

## 2024-01-22 DIAGNOSIS — I5043 Acute on chronic combined systolic (congestive) and diastolic (congestive) heart failure: Secondary | ICD-10-CM | POA: Diagnosis not present

## 2024-01-22 LAB — GLUCOSE, CAPILLARY
Glucose-Capillary: 148 mg/dL — ABNORMAL HIGH (ref 70–99)
Glucose-Capillary: 114 mg/dL — ABNORMAL HIGH (ref 70–99)
Glucose-Capillary: 155 mg/dL — ABNORMAL HIGH (ref 70–99)
Glucose-Capillary: 113 mg/dL — ABNORMAL HIGH (ref 70–99)

## 2024-01-22 LAB — BASIC METABOLIC PANEL WITH GFR
Anion gap: 10 (ref 5–15)
BUN: 28 mg/dL — ABNORMAL HIGH (ref 6–20)
CO2: 23 mmol/L (ref 22–32)
Calcium: 8.4 mg/dL — ABNORMAL LOW (ref 8.9–10.3)
Chloride: 96 mmol/L — ABNORMAL LOW (ref 98–111)
Creatinine, Ser: 1.52 mg/dL — ABNORMAL HIGH (ref 0.61–1.24)
GFR, Estimated: 57 mL/min — ABNORMAL LOW (ref >60)
Glucose, Bld: 87 mg/dL (ref 70–99)
Potassium: 3.6 mmol/L (ref 3.5–5.1)
Sodium: 129 mmol/L — ABNORMAL LOW (ref 135–145)

## 2024-01-22 LAB — COOXEMETRY PANEL
Carboxyhemoglobin: 2.7 % — ABNORMAL HIGH (ref 0.5–1.5)
Methemoglobin: 0.9 % (ref 0.0–1.5)
O2 Saturation: 67.4 %
Total hemoglobin: 10.6 g/dL — ABNORMAL LOW (ref 12.0–16.0)

## 2024-01-22 LAB — MAGNESIUM: Magnesium: 2.1 mg/dL (ref 1.7–2.4)

## 2024-01-22 LAB — CBC
HCT: 31.2 % — ABNORMAL LOW (ref 39.0–52.0)
Hemoglobin: 10.1 g/dL — ABNORMAL LOW (ref 13.0–17.0)
MCH: 25.6 pg — ABNORMAL LOW (ref 26.0–34.0)
MCHC: 32.4 g/dL (ref 30.0–36.0)
MCV: 79.2 fL — ABNORMAL LOW (ref 80.0–100.0)
Platelets: 431 K/uL — ABNORMAL HIGH (ref 150–400)
RBC: 3.94 MIL/uL — ABNORMAL LOW (ref 4.22–5.81)
RDW: 21 % — ABNORMAL HIGH (ref 11.5–15.5)
WBC: 14.7 K/uL — ABNORMAL HIGH (ref 4.0–10.5)
nRBC: 0.1 % (ref 0.0–0.2)

## 2024-01-22 LAB — PROTIME-INR
INR: 2.1 — ABNORMAL HIGH (ref 0.8–1.2)
Prothrombin Time: 24.2 s — ABNORMAL HIGH (ref 11.4–15.2)

## 2024-01-22 LAB — LACTATE DEHYDROGENASE: LDH: 243 U/L — ABNORMAL HIGH (ref 98–192)

## 2024-01-22 MED ORDER — POTASSIUM CHLORIDE CRYS ER 20 MEQ PO TBCR
40.0000 meq | EXTENDED_RELEASE_TABLET | Freq: Once | ORAL | Status: AC
  Administered 2024-01-22: 40 meq via ORAL
  Filled 2024-01-22: qty 2

## 2024-01-22 MED ORDER — PROSOURCE PLUS PO LIQD
30.0000 mL | Freq: Every day | ORAL | Status: DC
  Administered 2024-01-23 – 2024-01-24 (×2): 30 mL via ORAL
  Filled 2024-01-22 (×2): qty 30

## 2024-01-22 MED ORDER — COLCHICINE 0.6 MG PO TABS
0.6000 mg | ORAL_TABLET | Freq: Every day | ORAL | Status: DC
  Administered 2024-01-22 – 2024-01-24 (×3): 0.6 mg via ORAL
  Filled 2024-01-22 (×3): qty 1

## 2024-01-22 NOTE — Progress Notes (Signed)
 OT Cancellation Note  Patient Details Name: Philip Monroe MRN: [id number] DOB: 25-Feb-1976   Cancelled Treatment:    Reason Eval/Treat Not Completed: Patient declined, no reason specified (reports it's too early also with pain. Will check back later.)  Tranell Wojtkiewicz K, OTD, OTR/L SecureChat [facility]erred Acute Rehab [phone]   Philip Monroe 01/22/2024, 8:17 AM

## 2024-01-22 NOTE — Progress Notes (Signed)
 LVAD Coordinator Rounding Note:  Admitted 12/25/23 due to CHF and planned teeth extractions prior to VAD implant.   HM3 LVAD implanted on 01/11/24 by BS under DT criteria.  Pt sitting in bed on my arrival.   Plan to complete discharge education and have daughter change dressing today at 11am.  WBC trending down.  All home equipment has been ordered.  Vital signs: HR: 86 Doppler Pressure: 86 Automatic BP: 102/74 (84) O2 Sat: 98% on RA Wt: 319>349.2>334.2>335.1>324>316.8>316.1> 319.8>313.2>315.3 lbs    LVAD interrogation reveals:  Speed: 5600 Flow: 5 Power:  4.3w PI: 3.3 Alarms: none Events: 5-10 Hematocrit: 31 Fixed speed: 5600 Low speed limit: 5300  Drive Line: Existing VAD dressing removed and site care performed using sterile technique by daughter Ainaya with VAD coordinator observing. Drive line exit site cleaned with Chlora prep applicators x 2, allowed to dry, and gauze dressing with Silverlon patch applied. Exit site healing and unincorporated, the velour is fully implanted at exit site. 1 suture around red rubber intact. No redness, tenderness, foul odor or rash noted. moderate amount of normal fatty necrosis drainage. Drive line anchor re-applied. Increase dressing changes back to daily. Next dressing change 01/23/24 by VAD coordinator or nurse champion only.        Labs:  LDH trend: 274>292>277>253>282>276>243  INR trend: 1.2>1.4>1.5>1.6>1.7>1.9>2.1  Anticoagulation Plan: -INR Goal: 2-2.5 -ASA Dose: none  Blood Products:  - <intra-op/post-op> 01/11/24 - 4 FFP - 625 cc of cell saver  Device: -n/a -Therapies:  Arrythmias: frequent  PVCs in OR  Respiratory:  Extubated 10/31  Infection: 01/18/24: Resp culture>>not suitable for testing  Renal:  -CRT: 1.35>1.09>1.28>1.14>0.99>1.22>1.52  Gtts: Amio 30 mg/hr---OFF Milrinone  0.125 mcg/kg/min--OFF  Adverse Events on VAD:  Patient Education: Education complete see separate note for  details Daughter will need to change the dressing 1 more time prior to being checked off  Plan/Recommendations:  1. Please page VAD coordinator for any equipment issues or VAD alarms as well as for any procedures on VAD pts. 2. daily driveline dressing changes by VAD coordinator or nurse champion  Lauraine Ip RN, BSN VAD Coordinator 24/7 Pager [phone]

## 2024-01-22 NOTE — Progress Notes (Signed)
 VAD Discharge Teaching Note:  Discharge VAD teaching completed with Philip Monroe and his daughter Marius.   The home inspection checklist has been reviewed and no unsafe conditions have been identified. Family reports that there are at least two dedicated grounded, 3-prong outlets with clearly labeled circuit breaker has been established in the bedroom for power module and magazine features editor.   Both patient and caregiver have been trained on the following:  1. HM III LVAD overview of system operations  2. Overview of major lifestyle accommodations and cautions   3. Overview of system components (features and functions) 4. Changing power sources 5. Overview of alerts and alarms 6. How to identify and manage an emergency including when pump is running and when pump has stopped  7. Changing system controller 8. Maintain emergency contact list and medications  The patient and caregiver have successfully demonstrated:  1. Changing power source (from batteries to mobile power unit, mobile power unit to batteries, and replacing batteries) 2. Perform system controller self test  3. Check and charge batteries  4. Change system controller 5. Paged VAD pager and programmed number in phones  A daily flow sheet with patient  weight, temperature,  flow, speed, power, and PI, along with daily self checks on system controller and power module have been performed by patient and caregiver during hospitalization and will also be done daily at home.   The caregiver has been trained on percutaneous lead exit site care, care of the driveline and dressing changes. She/he has performed dressing changes during patient's hospitalization under my supervision with the support of the nursing staff. The importance of lead immobilization has been stressed to patient and caregiver using the attachment device. The caregiver has successfully demonstrated the following: 1. Cleansing site with sterile technique 2. Dressing  care and maintenance  3. Immobilizing driveline  The following routine activities and maintenance have been reviewed with patient and caregiver and both verbalize understanding:  1. Stressed importance of never disconnecting power from both controller power leads at the same time, and never disconnecting both batteries at the same time, or the pump will alarm and eventually stop if no power supply restored.  2. Plug the mobile power unit (MPU) and the universal battery charger (UBC) into properly grounded (3 prong) outlets dedicated to PM use. Do NOT use adapter (cheater plug) for ungrounded outlets or multiple portable socket outlets (power strips) 3. Do not connect the PM or MPU to an outlet controlled by wall switch or the device may not work 4. Transfer from MPU to batteries during [facility] mains power failure. The PM has internal backup battery that will power the pump while you transfer to batteries 5. Keep a backup system controller, charged batteries, battery clips, and flashlight near you during sleep in case of electrical power outage 6. Clean battery, battery clip, and universal battery charger contacts weekly 7. Visually inspect percutaneous lead daily 8. Check cables and connectors when changing power source  9. Rotate batteries; keep all eight batteries charged 10. Always have backup system controller, battery clips, fully charged batteries, and spare fully charged batteries when traveling 11. Re-calibrate batteries every 70 uses; monitor battery life of 36 months or 360 uses; replace batteries at end of battery life   Identified the following changes in activities of daily living with pump:  1. No driving for at least six weeks and then only if doctor gives permission to do so 2. No tub baths while pump implanted, and shower only if  doctor gives permission 3. No swimming or submersion in water  while implanted with pump 4. Keep all VAD equipment away from water  or moisture 5. Keep all  VAD connections clean and dry 6. No contact sports or engage in jumping activities 7. Never have an MRI while implanted with the pump 8. Never leave or store batteries in extremely hot or cold places (such as   trunk of your car), or the battery life will be shortened 9. Call the doctor or hospital contact person if any change in how the pump sounds, feels, or works 10. Plan to sleep only when connected to the mobile power unit. SABRA 11. Keep a backup system controller, charged batteries, battery clips, and flashlight near you during sleep in case of electrical power outage 12. Do not sleep on your stomach 13. Talk with doctor before any long distance travel plans 14. Patient will need antibiotics prior to any dental procedure; instructed to contact VAD coordinator before any dental procedures (including routine cleaning)   Discharge binder given to patient and include the following: 1. List of emergency contacts 2. Wallet card 3. HM III Luggage tags 4. HM III Alarms for Patients and their Caregivers 5. HM III Patient Handbook 6. HM III Patient Education Program DVD 7. Daily diary sheets 8. Warfarin teaching sheets 9. Nosebleed teaching sheets 10. Medications you may and may not take with CHF list  Discharge equipment includes:  1. Two system controllers 2. One mobile power unit with attached 21 ' patient cable 3. One universal magazine features editor (UBC) 4. Eight fully charged batteries  5. Four battery clips 6. One travel case 7. One holster vest 8. Wearable accessory package 9. Daily dressing kits and anchors  Discussed frequency and importance of INR checks; emphasized importance of maintaining INR goal to prevent clotting and or bleeding issues with pump.  Patient able to answer questions and asked good questions pertaining to warfarin and diet/lifestyle changes necessary to be successful and safe.  Patient will have INR managed by VAD Clinic; current INR goal is 2.0 - 2.5.    The  patient has completed a proficiency test for the HM III and all questions have been answered. The pt and family have been instructed to call if any questions, problems, or concerns arise. Pt and caregiver successfully paged VAD coordinator using VAD pager emergency number and have been instructed to use this number only for emergencies. Patient and caregiver asked appropriate questions, had good interaction with VAD coordinator, and verbalized understanding of above instructions.   Pt discharging to his house per original plan. His caregivers plan to stay with him/her for the first 2 weeks, and as needed after initial 2- week period.  Caregiver will plan to change drive line dressing daily. Pt verbalized agreement with this plan.   Lauraine Ip, RN VAD Coordinator  Office: [phone] 24/7 VAD Pager: [phone]

## 2024-01-22 NOTE — Progress Notes (Signed)
 Nutrition Follow-up  DOCUMENTATION CODES:   Not applicable  INTERVENTION:   Continue liberalized diet to REGULAR (No Salt packets) until appetite improves and pt eating adequately   Continue small, frequent meals as way to increased overal oral intake when appetite is lacking   Discontinue Ensure Plus High Protein po TID, each supplement provides 350 kcal and 20 grams of protein  Add 30 ml ProSource Plus daily, each supplement provides 100 kcals and 15 grams protein.     NUTRITION DIAGNOSIS:  Increased nutrient needs related to chronic illness (CHF) as evidenced by estimated needs.  GOAL:  Patient will meet greater than or equal to 90% of their needs   MONITOR:  PO intake, Supplement acceptance  REASON FOR ASSESSMENT:  Consult Assessment of nutrition requirement/status, LVAD Eval  ASSESSMENT:   48 year old male with history of chronic HFrEF, CAD, PVCs, morbid obesity, tobacco use, possible prior LV thrombus, gout. Admitted for preVAD optimization.  10/24 - right heart cath revealed with low filling pressures and reduced Cl; swan left in for hemodynamic monitoring 10/27 - multiple teeth extracted 10/30 - LVAD  10/31 - Extubated, CL diet 11/01 - advanced to Heart Healthy/Carb Mod 11/07 - transfer out ICU   Met with patient at bedside this afternoon. Drive line dressing change earlier today.   Average Meal Intake 11/03: 25-50% x3 documented offerings 11/07: 0-20% x2 documented offerings 11/09: 100% x1 documented offering  States he is eating smaller, more frequent amount of meals and offerings. Does not prefer Ensure. Discontinued. Discussed need for maintaining protein intake. Agreeable to trying Prosource once daily for additional 15g protein.    Pt working with therapies, ambulating with assistance   Denies nausea, early satiety, abdominal discomfort. Bowels moving again. No difficulties chewing or swallowing. Noted 11/06 addition of mirtazapine.   Admit Weight:  147.6 kg Current Weight: 143 kg   Weight stable. No significant edema on exam. Skin integrity intact.    Labs: Sodium 129 (L) Potassium 3.6 (wdl) Mg 2.1 (wdl) WBC 27.1>16.6>14.7 (H) CBGs 85-87 x24 hours A1c 6.6 (11/2023)   Meds: Dulcolax daily Colace daily Jardiance  SS Novolog  Semglee Mirtazipine Pantoprazole Spironolactone  IV ABX  Diet Order:   Diet Order             Diet regular Room service appropriate? Yes with Assist; Fluid consistency: Thin  Diet effective now                   EDUCATION NEEDS:   No education needs have been identified at this time  Skin:  Skin Assessment: Reviewed RN Assessment  Last BM:  11/09 - type 6 x1  Height:   Ht Readings from Last 1 Encounters:  01/16/24 6' 3 (1.905 m)    Weight:   Wt Readings from Last 1 Encounters:  01/21/24 (!) 143 kg    Ideal Body Weight:  89.1 kg  BMI:  Body mass index is 39.41 kg/m.  Estimated Nutritional Needs:   Kcal:  2500-2700  Protein:  140-155g  Fluid:  >2L/day  Blair Deaner MS, RD, LDN Registered Dietitian Clinical Nutrition RD Inpatient Contact Info in Amion

## 2024-01-22 NOTE — Progress Notes (Addendum)
 Occupational Therapy Treatment Patient Details Name: Philip Monroe MRN: [id number] DOB: Sep 19, 1975 Today's Date: 01/22/2024   History of present illness Pt is a 48 y.o. male who presented 10/24 for right heart cath with plan for LVAD implantation. S/p dental teeth extraction 10/27. S/p Implantation of HeartMate 3 Left Ventricular Assist Device 10/30 PMH: aortic root dilatation, asthma, CAD, CHF, CKD stage II, gout, DM, HLD, HTN, IVCD, morbid obesity, left ventricular noncompation, NSVT, tobacco abuse.   OT comments  Pt progressing toward goals, completing ADLs without assist during session, mobilizing for hallway distance ambulation at supervision level. Pt on LVAD batteries upon arrival, states he switched on his own, pt able to state everything that needs to be in his go bag and takes with him to ambulate. Reiterated sternal precautions and compensatory ADL strategies. Pt wanting to stay on batteries for a little while while up in chair, RN aware. Pt presenting with impairments listed below, will follow acutely. Recommend HHOT at d/c.       If plan is discharge home, recommend the following:  A lot of help with walking and/or transfers;A lot of help with bathing/dressing/bathroom;Assistance with cooking/housework;Direct supervision/assist for medications management;Assist for transportation;Direct supervision/assist for financial management;Help with stairs or ramp for entrance   Equipment Recommendations  BSC/3in1    Recommendations for Other Services PT consult    Precautions / Restrictions Precautions Precautions: Sternal;Fall Precaution Booklet Issued: Yes (comment) Recall of Precautions/Restrictions: Impaired Precaution/Restrictions Comments: LVAD       Mobility Bed Mobility               General bed mobility comments: EOB upon arrival, in chair at departure    Transfers Overall transfer level: Needs assistance Equipment used: None Transfers: Sit to/from  Stand Sit to Stand: From elevated surface, Supervision                 Balance Overall balance assessment: Needs assistance Sitting-balance support: No upper extremity supported, Feet supported Sitting balance-Leahy Scale: Good     Standing balance support: No upper extremity supported, Bilateral upper extremity supported, During functional activity Standing balance-Leahy Scale: Fair Standing balance comment: supervsion standing statically with no UE support                           ADL either performed or assessed with clinical judgement   ADL Overall ADL's : Needs assistance/impaired                         Toilet Transfer: Supervision/safety;Rollator (4 wheels);Ambulation           Functional mobility during ADLs: Supervision/safety;Rollator (4 wheels)      Extremity/Trunk Assessment Upper Extremity Assessment Upper Extremity Assessment: Overall WFL for tasks assessed   Lower Extremity Assessment Lower Extremity Assessment: Defer to PT evaluation        Vision   Vision Assessment?: No apparent visual deficits   Perception Perception Perception: Not tested   Praxis Praxis Praxis: Not tested   Communication Communication Communication: No apparent difficulties   Cognition Arousal: Alert Behavior During Therapy: WFL for tasks assessed/performed Cognition: No apparent impairments                               Following commands: Intact        Cueing   Cueing Techniques: Verbal cues  Exercises  Shoulder Instructions       General Comments VSS    Pertinent Vitals/ Pain       Pain Assessment Pain Assessment: Faces Pain Score: 6  Faces Pain Scale: Hurts even more Pain Location: sternum and R knee/elbow Pain Descriptors / Indicators: Aching, Operative site guarding, Tender, Sore, Constant Pain Intervention(s): Limited activity within patient's tolerance, Monitored during session, Repositioned  Home  Living                                          Prior Functioning/Environment              Frequency  Min 2X/week        Progress Toward Goals  OT Goals(current goals can now be found in the care plan section)  Progress towards OT goals: Progressing toward goals  Acute Rehab OT Goals Patient Stated Goal: to go home OT Goal Formulation: With patient Time For Goal Achievement: 01/29/24 Potential to Achieve Goals: Good ADL Goals Pt Will Perform Grooming: with modified independence;sitting Pt Will Perform Upper Body Dressing: with min assist;sitting;standing Pt Will Perform Lower Body Dressing: with min assist;sitting/lateral leans;sit to/from stand Pt Will Transfer to Toilet: with contact guard assist;ambulating;regular height toilet  Plan      Co-evaluation          OT goals addressed during session: ADL's and self-care;Strengthening/ROM      AM-PAC OT 6 Clicks Daily Activity     Outcome Measure   Help from another person eating meals?: None Help from another person taking care of personal grooming?: None Help from another person toileting, which includes using toliet, bedpan, or urinal?: A Little Help from another person bathing (including washing, rinsing, drying)?: A Lot Help from another person to put on and taking off regular upper body clothing?: A Little Help from another person to put on and taking off regular lower body clothing?: A Little 6 Click Score: 19    End of Session Equipment Utilized During Treatment: Rollator (4 wheels)  OT Visit Diagnosis: Unsteadiness on feet (R26.81);Other abnormalities of gait and mobility (R26.89);Muscle weakness (generalized) (M62.81)   Activity Tolerance Patient tolerated treatment well   Patient Left in chair;with call bell/phone within reach   Nurse Communication Mobility status (pt on LVAD batteries)        Time: 8474-8461 OT Time Calculation (min): 13 min  Charges: OT General  Charges $OT Visit: 1 Visit OT Treatments $Therapeutic Activity: 8-22 mins  Teegan Guinther K, OTD, OTR/L SecureChat Preferred Acute Rehab [phone]   Laneta POUR Koonce 01/22/2024, 4:59 PM

## 2024-01-22 NOTE — Discharge Summary (Incomplete)
 Advanced Heart Failure Team  Discharge Summary   Patient ID: Philip Monroe MRN: [id number], DOB/AGE: [birth year] 48 y.o. Admit date: 01/05/2024 D/C date:     01/24/2024   Primary Discharge Diagnoses:  Chronic systolic HF s/p HM III  Secondary Discharge Diagnoses:  CAD PVCs Hyponatremia ID Gout  Hospital Course:  48 y.o. 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    He's had multiple admissions for acute on chronic CHF over the last year. During most recent admission in 09/25 he required inotropic support with milrinone . LV function has been persistently less than 20% on echo and cMRI. Felt to be a candidate for VAD during multidisciplinary MRB.  Admitted 10/24 for preVAD optimization and dental extractions.  Patient underwent dental extractions on 10/27. On 10/30 he underwent HM III LVAD insertion by Dr. Daniel. Post-op he was diuresed and inotropes/pressors weaned. GDMT slowly titrated. Had increased PVC burden post-op which was treated with amiodarone  and mexiletine, pump site pain, and possible HAP treated with empiric abx. Functional status slowly improved with aggressive PT/OT, felt to no longer require CIR level of services at time of discharge.  RAMP echo on discharge day, speed increased to 5700. Septum midline at this speed, aortic valve opens every other beat. Moderate RV dysfunction, IVC not dilated.   Close follow-up in VAD clinic and INR check arranged.  Will arrange close follow up with PCP for insulin  mgmt. Will d/c back on Jardiance .   Will need digoxin  level checked at f/u.   Meds sent to [facility].  LVAD Interrogation HM III:   Speed:5750   Flow: 5.3  PI: 3.1    Power: 4.4   Back-up speed: 5400       Discharge Weight Range: 144.7kg Discharge Vitals: Blood pressure (!) 115/90, pulse 78, temperature 98.3 F (36.8 C), temperature source Oral, resp. rate 18, height 6' 3 (1.905 m),  weight (!) 144.7 kg, SpO2 96%.  Labs: Lab Results  Component Value Date   WBC 12.3 (H) 01/24/2024   HGB 9.9 (L) 01/24/2024   HCT 30.9 (L) 01/24/2024   MCV 81.3 01/24/2024   PLT 430 (H) 01/24/2024    Recent Labs  Lab 01/18/24 0508 01/19/24 0505 01/24/24 0415  NA 125*   < > 133*  K 3.8   < > 3.7  CL 90*   < > 99  CO2 23   < > 23  BUN 19   < > 20  CREATININE 0.99   < > 1.25*  CALCIUM 8.9   < > 8.4*  PROT 6.7  --   --   BILITOT 1.0  --   --   ALKPHOS 85  --   --   ALT 17  --   --   AST 26  --   --   GLUCOSE 115*   < > 115*   < > = values in this interval not displayed.   Lab Results  Component Value Date   CHOL 190 12/08/2023   HDL 39 (L) 12/08/2023   LDLCALC 130 (H) 12/08/2023   TRIG 26 01/12/2024   BNP (last 3 results) Recent Labs    01/05/24 1907 01/12/24 0500 01/18/24 0508  BNP 674.8* 623.8* 1,193.0*    ProBNP (last 3 results) No results for input(s): PROBNP in the last 8760 hours.   Diagnostic Studies/Procedures   ECHOCARDIOGRAM LIMITED Result Date: 01/24/2024    ECHOCARDIOGRAM LIMITED REPORT   Patient Name:  Philip Monroe Date of Exam: 01/24/2024 Medical Rec #:  [id number]      Height:       75.0 in Accession #:    [id number]     Weight:       319.0 lb Date of Birth:  [birth year]      BSA:          2.676 m Patient Age:    48 years       BP:           113/90 mmHg Patient Gender: M              HR:           68 bpm. Exam Location:  Inpatient Procedure: 2D Echo, LVAD Ramp Study, Cardiac Doppler and Color Doppler (Both            Spectral and Color Flow Doppler were utilized during procedure). Indications:    Congestive Heart Failure I50.9  History:        Patient has prior history of Echocardiogram examinations, most                 recent 01/16/2024. CHF and Cardiomyopathy, CKD; Risk                 Factors:Hypertension and Dyslipidemia.  Sonographer:    Koleen Popper RDCS Referring Phys: BENJAMIN J STONER IMPRESSIONS  1. At 5600 LV is severely dilated, AV  opens roughly 3/5 beats. Septum does appear to deviate to the left with some respiratory variation.     At 5700 RPM opens fully 2/5 beats, septum pulling more to the left. Left ventricular ejection fraction, by estimation, is <20%. The left ventricle has severely decreased function. The left ventricular internal cavity size was severely dilated.  2. A small pericardial effusion is present.  3. The mitral valve is normal in structure. Mild mitral valve regurgitation.  4. Aortic valve regurgitation is not visualized.  5. There is normal pulmonary artery systolic pressure. FINDINGS  Left Ventricle: At 5600 LV is severely dilated, AV opens roughly 3/5 beats. Septum does appear to deviate to the left with some respiratory variation. At 5700 RPM opens fully 2/5 beats, septum pulling more to the left. Left ventricular ejection fraction, by estimation, is <20%. The left ventricle has severely decreased function. The left ventricular internal cavity size was severely dilated. Right Ventricle: There is normal pulmonary artery systolic pressure. The tricuspid regurgitant velocity is 2.57 m/s, and with an assumed right atrial pressure of 3 mmHg, the estimated right ventricular systolic pressure is 29.4 mmHg. Pericardium: A small pericardial effusion is present. Mitral Valve: The mitral valve is normal in structure. Mild mitral valve regurgitation. Tricuspid Valve: The tricuspid valve is normal in structure. Tricuspid valve regurgitation is mild. Aortic Valve: Aortic valve regurgitation is not visualized. Pulmonic Valve: The pulmonic valve was normal in structure. Pulmonic valve regurgitation is mild. Additional Comments: Spectral Doppler performed. Color Doppler performed.  LEFT VENTRICLE PLAX 2D LVIDd:         7.50 cm  IVC IVC diam: 2.00 cm PULMONIC VALVE PV Vmax:       0.97 m/s PV Vmean:      63.450 cm/s PV VTI:        0.153 m PV Peak grad:  3.8 mmHg PV Mean grad:  2.0 mmHg  TRICUSPID VALVE TR Peak grad:   26.4 mmHg TR Vmax:         257.00 cm/s Morene Brownie Electronically signed by  Morene Brownie Signature Date/Time: 01/24/2024/11:30:52 AM    Final     Discharge Medications   Allergies as of 01/24/2024       Reactions   Lactose Intolerance (gi) Diarrhea   Statins    myalgias        Medication List     STOP taking these medications    Eliquis  5 MG Tabs tablet Generic drug: apixaban    sacubitril -valsartan  24-26 MG Commonly known as: Entresto        TAKE these medications    Accu-Chek Guide Test test strip Generic drug: glucose blood Use up to four times daily as directed. 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   Accu-Chek Softclix Lancets lancets Use up to four times daily as directed. 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   albuterol  108 (90 Base) MCG/ACT inhaler Commonly known as: VENTOLIN  HFA Inhale 1-2 puffs into the lungs every 6 (six) hours as needed for wheezing or shortness of breath.   allopurinol  100 MG tablet Commonly known as: Zyloprim  Take 1 tablet (100 mg total) by mouth daily.   amiodarone  200 MG tablet Commonly known as: PACERONE  Take 1 tablet (200 mg total) by mouth daily. Start taking on: January 25, 2024   amoxicillin-clavulanate 875-125 MG tablet Commonly known as: AUGMENTIN Take 1 tablet by mouth every 12 (twelve) hours for 3 doses.   Blood Glucose Monitor System w/Device Kit Use up to four times daily as directed. 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   colchicine  0.6 MG tablet Take 1 tablet (0.6 mg total) by mouth daily. Start taking on: January 25, 2024 What changed: when to take this   digoxin  0.125 MG tablet Commonly known as: LANOXIN  Take 1 tablet (0.125 mg total) by mouth daily.   empagliflozin  10 MG Tabs tablet Commonly known as: JARDIANCE  Take 1 tablet (10 mg total) by mouth daily.   ezetimibe  10 MG tablet Commonly known as: ZETIA  Take 1 tablet (10 mg total) by mouth at bedtime.   gabapentin 300 MG capsule Commonly known as: NEURONTIN Take 2 capsules (600 mg  total) by mouth daily at 8 pm.   gabapentin 100 MG capsule Commonly known as: NEURONTIN Take 2 capsules (200 mg total) by mouth daily before breakfast. Start taking on: January 25, 2024   Lancet Device Misc 1 each by Does not apply route as directed. Dispense based on patient and insurance preference. Use up to four times daily as directed. 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   losartan  25 MG tablet Commonly known as: COZAAR  Take 1 tablet (25 mg total) by mouth daily. Start taking on: January 25, 2024   metFORMIN 500 MG tablet Commonly known as: GLUCOPHAGE Take 1 tablet (500 mg total) by mouth daily with breakfast.   mexiletine 150 MG capsule Commonly known as: MEXITIL  Take 2 capsules (300 mg total) by mouth 2 (two) times daily.   Oxycodone  HCl 10 MG Tabs Take 1-1.5 tablets (10-15 mg total) by mouth every 6 (six) hours as needed for up to 7 days for severe pain (pain score 7-10).   pantoprazole 40 MG tablet Commonly known as: PROTONIX Take 1 tablet (40 mg total) by mouth daily. Start taking on: January 25, 2024   spironolactone  25 MG tablet Commonly known as: ALDACTONE  Take 1 tablet (25 mg total) by mouth daily.   torsemide  20 MG tablet Commonly known as: DEMADEX  Take 1 tablet (20 mg total) by mouth daily as needed. For weight gain >3lbs overnight or 5lbs in 1 week What changed:  how much to take when to  take this reasons to take this additional instructions   traMADol 50 MG tablet Commonly known as: ULTRAM Take 1-2 tablets (50-100 mg total) by mouth every 8 (eight) hours as needed for up to 7 days for moderate pain (pain score 4-6).   warfarin 5 MG tablet Commonly known as: COUMADIN Take 5 mg (1 tablet) daily except take 7.5 mg (1.5 tablets) on Monday, Wednesday and Friday.               Durable Medical Equipment  (From admission, onward)           Start     Ordered   01/24/24 1135  For home use only DME 4 wheeled rolling walker with seat  Once        Comments: Bariatric  Question:  Patient needs a walker to treat with the following condition  Answer:  Physical deconditioning   01/24/24 1134            Disposition   The patient will be discharged in stable condition to home. Discharge Instructions     AMB Referral to Cardiac Rehabilitation - Phase II   Complete by: As directed    Diagnosis: Other   After initial evaluation and assessments completed: Virtual Based Care may be provided alone or in conjunction with Phase 2 Cardiac Rehab based on patient barriers.: Yes   Intensive Cardiac Rehabilitation (ICR) [facility] location only OR Traditional Cardiac Rehabilitation (TCR) *If criteria for ICR are not met will enroll in TCR [facility] only): Yes       Follow-up Information      [facility] Follow up on 01/31/2024.   Specialty: Cardiology Why: Follow up with Dr. Cherrie 01/31/24 at 11 am Contact information: [address] [place]  [address] [phone]        Hyacinth Honey, NP. Go in 2 day(s).   Specialty: Family Medicine Why: Hospital follow up appointment scheduled for Friday, January 26, 2024 at 9:40 AM.  PLEASE ARRIVE 10-15 minutes early.  PLEASE call to cancel/reschedule if you CANNOT make appointment. Contact information: [address] [place] [address] [phone]                   APP Duration of Discharge Encounter: 18 minutes  Signed, Beckey LITTIE Coe AGACNP-BC  01/24/2024, 12:42 PM   Patient seen with NP, I formulated the plan and agree with the above note.   Please see my separate note for today.    32 minutes physician discharge time.   Ezra Shuck 01/24/2024 2:12 PM

## 2024-01-22 NOTE — Progress Notes (Addendum)
 Physical Therapy Treatment Patient Details Name: Philip Monroe MRN: [id number] DOB: 1976/02/12 Today's Date: 01/22/2024   History of Present Illness Pt is a 48 y.o. male who presented 10/24 for right heart cath with plan for LVAD implantation. S/p dental teeth extraction 10/27. S/p Implantation of HeartMate 3 Left Ventricular Assist Device 10/30 PMH: aortic root dilatation, asthma, CAD, CHF, CKD stage II, gout, DM, HLD, HTN, IVCD, morbid obesity, left ventricular noncompation, NSVT, tobacco abuse.    PT Comments  Pt admitted with above diagnosis. Assisted pt out of room after using bathroom. Pt did everything with Modif I vs Supervision level. Discussed technique for steps but pt too fatigued to practice. He has bil rails and assist 24/7 per pt intiially.Pt feels ready to go home.  Pt currently with functional limitations due to the deficits listed below (see PT Problem List). Pt will benefit from acute skilled PT to increase their independence and safety with mobility to allow discharge.       If plan is discharge home, recommend the following: Assistance with cooking/housework;Assist for transportation;Help with stairs or ramp for entrance;A little help with walking and/or transfers;A little help with bathing/dressing/bathroom   Can travel by private vehicle        Equipment Recommendations  BSC/3in1;Rollator (4 wheels)    Recommendations for Other Services       Precautions / Restrictions Precautions Precautions: Sternal;Fall Precaution Booklet Issued: Yes (comment) Recall of Precautions/Restrictions: Impaired Precaution/Restrictions Comments: LVAD Restrictions Weight Bearing Restrictions Per Provider Order: No RUE Weight Bearing Per Provider Order: Non weight bearing LUE Weight Bearing Per Provider Order: Non weight bearing Other Position/Activity Restrictions: Cardiac Sternal Precautions     Mobility  Bed Mobility Overal bed mobility: Needs Assistance Bed Mobility: Supine  to Sit     Supine to sit: HOB elevated, Supervision     General bed mobility comments: Pt was in bathroom on arrival as this PT helped him in the bathroom earlier.    Transfers Overall transfer level: Needs assistance Equipment used: None Transfers: Sit to/from Stand Sit to Stand: From elevated surface, Supervision           General transfer comment: Supervision with no assist to  boost to stand from elevated bed surface, rocked once for momentum.  Hands lightly in lap.    Ambulation/Gait Ambulation/Gait assistance: Contact guard assist Gait Distance (Feet): 10 Feet Assistive device: None Gait Pattern/deviations: Step-through pattern, Decreased stride length, Antalgic, Decreased stance time - right, Wide base of support Gait velocity: dec Gait velocity interpretation: <1.31 ft/sec, indicative of household ambulator   General Gait Details: Pt walked to sink from bathroom and washed hands. Stood to wash hands without assist or cues. Then needed rest break so walked to bed to rest.  OT coming therefore will let pt rest until OT comes to see pt.   Stairs             Wheelchair Mobility     Tilt Bed    Modified Rankin (Stroke Patients Only)       Balance Overall balance assessment: Needs assistance Sitting-balance support: No upper extremity supported, Feet supported Sitting balance-Leahy Scale: Good     Standing balance support: No upper extremity supported, Bilateral upper extremity supported, During functional activity Standing balance-Leahy Scale: Fair Standing balance comment: supervsion standing statically with no UE support                            Communication  Communication Communication: No apparent difficulties  Cognition Arousal: Alert Behavior During Therapy: WFL for tasks assessed/performed   PT - Cognitive impairments: No apparent impairments                         Following commands: Intact      Cueing  Cueing Techniques: Verbal cues  Exercises General Exercises - Lower Extremity Long Arc Quad: Strengthening, Both, 10 reps, Seated    General Comments General comments (skin integrity, edema, etc.): LVAD parameters without change throughout      Pertinent Vitals/Pain Pain Assessment Pain Assessment: Faces Faces Pain Scale: Hurts even more Pain Location: sternum and R knee/elbow Pain Descriptors / Indicators: Aching, Operative site guarding, Tender, Sore, Constant Pain Intervention(s): Limited activity within patient's tolerance, Monitored during session, Repositioned    Home Living                          Prior Function            PT Goals (current goals can now be found in the care plan section) Progress towards PT goals: Progressing toward goals    Frequency    Min 2X/week      PT Plan      Co-evaluation       OT goals addressed during session: ADL's and self-care;Strengthening/ROM      AM-PAC PT 6 Clicks Mobility   Outcome Measure  Help needed turning from your back to your side while in a flat bed without using bedrails?: A Little Help needed moving from lying on your back to sitting on the side of a flat bed without using bedrails?: A Little Help needed moving to and from a bed to a chair (including a wheelchair)?: A Little Help needed standing up from a chair using your arms (e.g., wheelchair or bedside chair)?: A Little Help needed to walk in hospital room?: A Little Help needed climbing 3-5 steps with a railing? : A Little 6 Click Score: 18    End of Session   Activity Tolerance: Patient tolerated treatment well Patient left: with call bell/phone within reach (sitting EOB) Nurse Communication: Mobility status PT Visit Diagnosis: Difficulty in walking, not elsewhere classified (R26.2);Unsteadiness on feet (R26.81);Other abnormalities of gait and mobility (R26.89);Muscle weakness (generalized) (M62.81);Pain Pain - part of body:   (Sternum)     Time: 1451-1501 PT Time Calculation (min) (ACUTE ONLY): 10 min  Charges:    $Gait Training: 8-22 mins $Self Care/Home Management: 8-22 PT General Charges $$ ACUTE PT VISIT: 1 Visit                     Meredyth Hornung M,PT Acute Rehab Services [phone]    Philip Monroe 01/22/2024, 3:12 PM

## 2024-01-22 NOTE — Progress Notes (Signed)
 H&V Care Navigation CSW Progress Note  Clinical Social Worker met with pt at bedside to check in and assist with getting paperwork for [facility] signed and returned so disability can be started.  Paperwork completed and mailed off.  Patient reports doing ok- feeling somewhat tired today after a busy day yesterday.  Reports no concerns or needs at this time.  CSW will continue to follow during inpatient stay and assist as needed  Cannen Dupras H. Jonnelle Lawniczak, LCSW Clinical Social Worker Advanced Heart Failure Clinic Desk#: [phone] Cell#: [phone]

## 2024-01-22 NOTE — Progress Notes (Signed)
 Physical Therapy Treatment Patient Details Name: Philip Monroe MRN: [id number] DOB: 20-Nov-1975 Today's Date: 01/22/2024   History of Present Illness Pt is a 48 y.o. male who presented 10/24 for right heart cath with plan for LVAD implantation. S/p dental teeth extraction 10/27. S/p Implantation of HeartMate 3 Left Ventricular Assist Device 10/30 PMH: aortic root dilatation, asthma, CAD, CHF, CKD stage II, gout, DM, HLD, HTN, IVCD, morbid obesity, left ventricular noncompation, NSVT, tobacco abuse.    PT Comments  Pt admitted with above diagnosis. Pt was able to ambulate without device short distance to bathroom as he needed to have BM.  Pt independent with LVAD equipment.  Donned socks independently. Progressing well.  Updated d/c plan to HHPT f/u.  Needs rollator (bariatric if qualifies). Pt currently with functional limitations due to the deficits listed below (see PT Problem List). Pt will benefit from acute skilled PT to increase their independence and safety with mobility to allow discharge.       If plan is discharge home, recommend the following: Assistance with cooking/housework;Assist for transportation;Help with stairs or ramp for entrance;A little help with walking and/or transfers;A little help with bathing/dressing/bathroom   Can travel by private vehicle        Equipment Recommendations  BSC/3in1;Rollator (4 wheels)    Recommendations for Other Services       Precautions / Restrictions Precautions Precautions: Sternal;Fall Precaution Booklet Issued: Yes (comment) Recall of Precautions/Restrictions: Impaired Precaution/Restrictions Comments: LVAD Restrictions Weight Bearing Restrictions Per Provider Order: No RUE Weight Bearing Per Provider Order: Non weight bearing LUE Weight Bearing Per Provider Order: Non weight bearing Other Position/Activity Restrictions: Cardiac Sternal Precautions     Mobility  Bed Mobility Overal bed mobility: Needs Assistance Bed Mobility:  Supine to Sit     Supine to sit: HOB elevated, Supervision     General bed mobility comments: Placed socks on without assist. Independent with all LVAD equipment. Pt needed supervision, assist with line management only. Good recall of precautions    Transfers Overall transfer level: Needs assistance Equipment used: Rollator (4 wheels), None Transfers: Sit to/from Stand Sit to Stand: From elevated surface, Supervision           General transfer comment: Supervision with no assist to  boost to stand from elevated bed surface, rocked once for momentum.  Hands lightly in lap. Did not want to use rollator to ambulate to bathroom.    Ambulation/Gait Ambulation/Gait assistance: Contact guard assist Gait Distance (Feet): 10 Feet Assistive device: None Gait Pattern/deviations: Step-through pattern, Decreased stride length, Antalgic, Decreased stance time - right, Wide base of support Gait velocity: dec     General Gait Details: Pt asked to go into bathroom to have BM.  Assisted pt to walk to bathroom with supervision only.  Able to sit down on his own.   Stairs             Wheelchair Mobility     Tilt Bed    Modified Rankin (Stroke Patients Only)       Balance Overall balance assessment: Needs assistance Sitting-balance support: No upper extremity supported, Feet supported Sitting balance-Leahy Scale: Good     Standing balance support: No upper extremity supported, Bilateral upper extremity supported, During functional activity Standing balance-Leahy Scale: Fair Standing balance comment: supervsion standing statically with no UE support                            Communication Communication Communication:  No apparent difficulties  Cognition Arousal: Alert Behavior During Therapy: WFL for tasks assessed/performed   PT - Cognitive impairments: No apparent impairments                         Following commands: Intact      Cueing  Cueing Techniques: Verbal cues  Exercises      General Comments General comments (skin integrity, edema, etc.): LVAD parameters without change throughout      Pertinent Vitals/Pain Pain Assessment Pain Assessment: Faces Faces Pain Scale: Hurts even more Pain Location: sternum and R knee/elbow Pain Descriptors / Indicators: Aching, Operative site guarding, Tender, Sore, Constant Pain Intervention(s): Limited activity within patient's tolerance, Monitored during session, Repositioned    Home Living                          Prior Function            PT Goals (current goals can now be found in the care plan section) Progress towards PT goals: Progressing toward goals    Frequency    Min 2X/week      PT Plan      Co-evaluation              AM-PAC PT 6 Clicks Mobility   Outcome Measure  Help needed turning from your back to your side while in a flat bed without using bedrails?: A Little Help needed moving from lying on your back to sitting on the side of a flat bed without using bedrails?: A Little Help needed moving to and from a bed to a chair (including a wheelchair)?: A Little Help needed standing up from a chair using your arms (e.g., wheelchair or bedside chair)?: A Little Help needed to walk in hospital room?: A Little Help needed climbing 3-5 steps with a railing? : A Little 6 Click Score: 18    End of Session   Activity Tolerance: Patient tolerated treatment well Patient left: with call bell/phone within reach (on toilet in bathroom) Nurse Communication: Mobility status PT Visit Diagnosis: Difficulty in walking, not elsewhere classified (R26.2);Unsteadiness on feet (R26.81);Other abnormalities of gait and mobility (R26.89);Muscle weakness (generalized) (M62.81);Pain Pain - part of body:  (Sternum)     Time: 8570-8558 PT Time Calculation (min) (ACUTE ONLY): 12 min  Charges:    $Gait Training: 8-22 mins PT General Charges $$ ACUTE PT  VISIT: 1 Visit                     Philip Monroe M,PT Acute Rehab Services [phone]    Philip Monroe 01/22/2024, 2:53 PM

## 2024-01-22 NOTE — Progress Notes (Signed)
 PHARMACY - ANTICOAGULATION CONSULT NOTE  Pharmacy Consult for warfarin Indication: LVAD  HM3  Allergies  Allergen Reactions   Lactose Intolerance (Gi) Diarrhea   Statins     myalgias    Patient Measurements: Height: 6' 3 (190.5 cm) Weight: (!) 143 kg (315 lb 4.8 oz) IBW/kg (Calculated) : 84.5 HEPARIN  DW (KG): 117  Vital Signs: Temp: 98 F (36.7 C) (11/10 1103) Temp Source: Axillary (11/10 1103) BP: 97/77 (11/10 1200) Pulse Rate: 87 (11/10 1103)  Labs: Recent Labs    01/20/24 0445 01/20/24 0630 01/20/24 0630 01/21/24 0510 01/22/24 0445  HGB  --  10.7*   < > 9.9* 10.1*  HCT  --  32.3*  --  30.8* 31.2*  PLT  --  375  --  389 431*  LABPROT 24.6*  --   --  26.0* 24.2*  INR 2.1*  --   --  2.3* 2.1*  CREATININE 1.37*  --   --  1.51* 1.52*   < > = values in this interval not displayed.    Estimated Creatinine Clearance: 91.7 mL/min (A) (by C-G formula based on SCr of 1.52 mg/dL (H)).   Medical History: Past Medical History:  Diagnosis Date   Aortic root dilatation    a. 07/2022 Echo: Ao root 4.4cm, Asc Ao 4.0cm.   Asthma    as a child, no problems as an adult, rarely uses inhaler   CAD (coronary artery disease)    a. 08/2017 Cath: LM nl, LAD 81m/d, D1 70ost, LCX 125m, OM1 90 inf branch, 60 sup branch, OM2 small, RCA 30 diffuse, RPDA 100 w/ L->R collats; b. 07/2022 Echo: LM nl, LAD 97m, D1 80, LCX 158m, OM1 80, RCA 37m, RPDA 100-->No targets for PCI/CABG-->Med rx.   CHF (congestive heart failure) (HCC)    takes lasix    Chronic HFrEF (heart failure with reduced ejection fraction) (HCC)    a. 03/2015 Echo: EF 35-40%; b. 08/2017 Echo: EF 15%; c. 08/2017 cMRI: EF 35%; d. 06/2018 Echo: EF 10-15%; e. 07/2022 Echo: EF 15-20%, grIII DD, mild MR, sev dil LA, mod-sev PH, Ao root 4.4cm, Asc Ao 4.0cm; f. 07/2022 RHC: RA 4, RV 38/3, PA 43/18 (26), PCWP 11. CO/CI 5.3/1.8.   CKD (chronic kidney disease), stage II    Diabetes mellitus without complication (HCC)    type 2   Dyspnea     with exertion   Gout    Hyperlipidemia LDL goal <70    Hypertension    Ischemic cardiomyopathy    a. 03/2015 Echo: EF 35-40%; b. 08/2017 Echo: EF 15%; c. 08/2017 cMRI: EF 35%; d. 06/2018 Echo: EF 10-15%; e. 07/2022 Echo: EF 15-20%, grIII DD.   IVCD (intraventricular conduction defect)    Left ventricular noncompaction [facility])    Morbid obesity (HCC)    NSVT (nonsustained ventricular tachycardia) (HCC)    a. 09/2022 Event monitor: predominantly sinus rhythm, avg HR 81 (46-167). 17 runs NSVT (fastest/longest 13 beats x 167). Rare PACs, 17.5% PVC burden.   PVC's (premature ventricular contractions)    a. 09/2022 Event Monitor: 17.5% PVC burden.   Statin intolerance    Tobacco abuse     Medications:  Scheduled:   amiodarone   200 mg Oral Daily   bisacodyl  10 mg Oral Daily   Or   bisacodyl  10 mg Rectal Daily   Chlorhexidine  Gluconate Cloth  6 each Topical Daily   digoxin   0.125 mg Oral Daily   docusate sodium  200 mg Oral Daily  empagliflozin   10 mg Oral Daily   ezetimibe   10 mg Oral Daily   feeding supplement  237 mL Oral TID BM   gabapentin  200 mg Oral QAC breakfast   gabapentin  600 mg Oral Q2000   insulin  aspart  0-24 Units Subcutaneous TID WC   insulin  glargine-yfgn  18 Units Subcutaneous Daily   lidocaine   1 patch Transdermal Q24H   mexiletine  150 mg Oral Q8H   mirtazapine  7.5 mg Oral QHS   pantoprazole  40 mg Oral Daily   potassium chloride   40 mEq Oral Once   sodium chloride  flush  10-40 mL Intracatheter Q12H   sodium chloride  flush  3 mL Intravenous Q12H   spironolactone   25 mg Oral Daily   warfarin  5 mg Oral q1600   Warfarin - Pharmacist Dosing Inpatient   Does not apply q1600    Assessment: 48 yom who underwent HM3 LVAD implantation on 10/30. Of note had recent teeth extraction on 10/27. Was on apixaban  PTA for hx Afib - LD 10/22.   Warfarin started post-op on 10/31 (INR 1.2).  INR today 2.1 at goal  Hgb, platelets and LDH stable. Stopped enox40 11/7. Talked with  patient who said appetite ~40% of his normal - but is drinking ensures  Goal of Therapy:  INR 2-2.5 Monitor platelets by anticoagulation protocol: Yes   Plan:  Continue Warfarin 5 mg daily  Monitor daily INR, CBC, and for s/sx of bleeding   Harlene Denna Berdine JONETTA ARABELLA, BCCP Clinical Pharmacist  01/22/2024 3:15 PM   [facility] pharmacy phone numbers are listed on [email or web address]

## 2024-01-22 NOTE — Progress Notes (Signed)
 Patient ID: Philip Monroe, male   DOB: [birth year], 48 y.o.   MRN: [id number]   Advanced Heart Failure Rounding Note  Chief Complaint: s/p HM3 Implant Subjective:    10/30 S/P HMIII 11/4 Ramp Echo Speed increased 5600  Co-ox 67%.  MAP 80-100s. sCr stable 1.51>1.52. Hgb stable. Repeat ramp today.   Feeling better this morning. Was having incisional chest pain, improved with lidocaine  patch. Family at bedside for teaching with VAD coordinator.   LVAD Interrogation HM III: Speed: 5600 Flow: 4.9 PI: 4.3 Power: 4.3, few PI events  Objective:    Vital Signs:   Temp:  [98 F (36.7 C)-98.5 F (36.9 C)] 98 F (36.7 C) (11/10 1103) Pulse Rate:  [86-90] 86 (11/10 0939) Resp:  [18-20] 20 (11/10 1103) BP: (82-132)/(32-119) 97/77 (11/10 1103) SpO2:  [95 %-98 %] 98 % (11/10 1103) Last BM Date : 01/21/24 MAP 80s-90s  Weight change: Filed Weights   01/19/24 0500 01/20/24 0620 01/21/24 0528  Weight: (!) 142.1 kg (!) 144.2 kg (!) 143 kg   Intake/Output:  Intake/Output Summary (Last 24 hours) at 01/22/2024 1143 Last data filed at 01/22/2024 0608 Gross per 24 hour  Intake 1120 ml  Output --  Net 1120 ml    Physical Exam  General: Well appearing. No distress on RA Cardiac: JVP flat. Mechanical heart sounds with LVAD hum present.  Driveline: Dressing C/D/I. No drainage or redness. Anchor in place. Extremities: Warm and dry. No peripheral edema. Neuro: Alert and oriented x3. Affect pleasant. Moves all extremities without difficulty.  Labs: Basic Metabolic Panel: Recent Labs  Lab 01/16/24 0446 01/16/24 0553 01/17/24 0404 01/17/24 0433 01/18/24 0508 01/19/24 0505 01/20/24 0445 01/21/24 0510 01/22/24 0445  NA  --    < > 125*   < > 125* 126* 128* 130* 129*  K  --    < > 3.2*   < > 3.8 3.7 3.6 3.7 3.6  CL  --    < > 85*   < > 90* 92* 94* 96* 96*  CO2  --    < > 26   < > 23 21* 21* 21* 23  GLUCOSE  --    < > 268*   < > 115* 102* 87 85 87  BUN  --    < > 22*   < > 19 24* 29* 31*  28*  CREATININE  --    < > 1.14   < > 0.99 1.22 1.37* 1.51* 1.52*  CALCIUM  --    < > 8.2*   < > 8.9 8.3* 8.3* 8.0* 8.4*  MG 2.2  --  2.0  --  2.2  --  2.2 2.0 2.1  PHOS 3.9  --  3.0  --  4.1  --   --   --   --    < > = values in this interval not displayed.   Liver Function Tests: Recent Labs  Lab 01/18/24 0508  AST 26  ALT 17  ALKPHOS 85  BILITOT 1.0  PROT 6.7  ALBUMIN 2.5*   No results for input(s): LIPASE, AMYLASE in the last 168 hours. No results for input(s): AMMONIA in the last 168 hours.  CBC: Recent Labs  Lab 01/16/24 0446 01/17/24 0404 01/17/24 0433 01/18/24 0508 01/19/24 0540 01/20/24 0630 01/21/24 0510 01/22/24 0445  WBC 12.2* 13.4*  --  21.9* 23.1* 27.1* 16.6* 14.7*  NEUTROABS 8.0* 8.8*  --  16.4*  --   --   --   --  HGB 10.7* 10.1*   < > 11.0* 11.3* 10.7* 9.9* 10.1*  HCT 32.7* 30.9*   < > 34.3* 35.1* 32.3* 30.8* 31.2*  MCV 79.2* 79.8*  --  80.0 80.0 78.0* 79.6* 79.2*  PLT 170 185  --  256 349 375 389 431*   < > = values in this interval not displayed.   BNP (last 3 results) Recent Labs    01/05/24 1907 01/12/24 0500 01/18/24 0508  BNP 674.8* 623.8* 1,193.0*   Medications:    Scheduled Medications:  amiodarone   200 mg Oral Daily   bisacodyl  10 mg Oral Daily   Or   bisacodyl  10 mg Rectal Daily   Chlorhexidine  Gluconate Cloth  6 each Topical Daily   digoxin   0.125 mg Oral Daily   docusate sodium  200 mg Oral Daily   empagliflozin   10 mg Oral Daily   ezetimibe   10 mg Oral Daily   feeding supplement  237 mL Oral TID BM   gabapentin  200 mg Oral QAC breakfast   gabapentin  600 mg Oral Q2000   insulin  aspart  0-24 Units Subcutaneous TID WC   insulin  glargine-yfgn  18 Units Subcutaneous Daily   lidocaine   1 patch Transdermal Q24H   mexiletine  150 mg Oral Q8H   mirtazapine  7.5 mg Oral QHS   pantoprazole  40 mg Oral Daily   sodium chloride  flush  10-40 mL Intracatheter Q12H   sodium chloride  flush  3 mL Intravenous Q12H    spironolactone   25 mg Oral Daily   warfarin  5 mg Oral q1600   Warfarin - Pharmacist Dosing Inpatient   Does not apply q1600    Infusions:  piperacillin-tazobactam (ZOSYN)  IV 3.375 g (01/22/24 0608)    PRN Medications: dextrose , morphine  injection, ondansetron  (ZOFRAN ) IV, mouth rinse, oxyCODONE , simethicone , sodium chloride  flush, sodium chloride  flush, traMADol   Plan/Discussion:    1. Chronic systolic heart failure: - combined ischemic and nonischemic: -Mentions of compaction CM from [facility] cards notes. PVCs may also contribute, 25.3% PVCs with NSVT on Zio monitor in 5/25 - RHC 10/25 with low filling pressures and reduced CI (1.9 TD, 2.2 Fick). - NYHA class IV symptoms on admission.  - S/P HM3 LVAD Implant - intra-op TEE with normal RV function (per anesthesia report).  - 11/4 Ramp Echo Speed increased 5600.  - Appears dry. No need for diuretics.  - Stable off milrinone , co-ox 67% - Continue spironolactone  to 25 - Continue digoxin  0.125 mcg - Continue jardiance  10 mg daily  - MAP 80-100s; add losartan  tomorrow if renal function improves - Continue warfarin. INR 2.1, goal 2-2.5 - Repeat ramp echo   2 CAD:   - [facility] 5/[facility]: severe multivessel CAD including 80% mid RCA, 100% RPDA, 100% mid LCx, 80% OM1, 80% mid-distal LAD.  - LHC 9/25: occluded PDA and mid AV LCx, 90% ramus, severe diffuse disease in right PLV, nonobstructive LAD disease. With markedly low EF and without severe LAD disease, CABG nor a durable option.  - No ASA given anticoagulation use and stable CAD.  - Continue Zetia .   3. PVCs:  - Zio 5/25 with 25.3% (prev did not tolerate amiodarone  well, on mexiletine) - increased burden post-op, now improved - Continue amiodarone  200 mg daily - Continue mexilitine 150 mg every 8 hours.  4. Hyponatremia - Mild/stable 128.   5. ID: WBCs down to 16K today.  UA and C diff negative, pending blood and sputum cultures but NGTD. -  Stop zyvox given negative MRSA swab -  Continue zosyn for empiric HAP coverage, improving exam.   Hanifah Royse, NP 11:43 AM  VAD Team Pager [phone] (7am - 7am)  Advanced Heart Failure Team Pager [phone] (M-F; 7a - 5p)  Please contact [facility] for night-coverage after hours (4p -7a ) and weekends on [email or web address]

## 2024-01-22 NOTE — Plan of Care (Signed)
  Problem: Education: Goal: Understanding of CV disease, CV risk reduction, and recovery process will improve Outcome: Progressing Goal: Individualized Educational Video(s) Outcome: Progressing   Problem: Activity: Goal: Ability to return to baseline activity level will improve Outcome: Progressing   Problem: Cardiovascular: Goal: Ability to achieve and maintain adequate cardiovascular perfusion will improve Outcome: Progressing Goal: Vascular access site(s) Level 0-1 will be maintained Outcome: Progressing   Problem: Health Behavior/Discharge Planning: Goal: Ability to safely manage health-related needs after discharge will improve Outcome: Progressing   Problem: Education: Goal: Knowledge of General Education information will improve Description: Including pain rating scale, medication(s)/side effects and non-pharmacologic comfort measures Outcome: Progressing   Problem: Health Behavior/Discharge Planning: Goal: Ability to manage health-related needs will improve Outcome: Progressing   Problem: Clinical Measurements: Goal: Ability to maintain clinical measurements within normal limits will improve Outcome: Progressing Goal: Will remain free from infection Outcome: Progressing Goal: Diagnostic test results will improve Outcome: Progressing Goal: Respiratory complications will improve Outcome: Progressing Goal: Cardiovascular complication will be avoided Outcome: Progressing   Problem: Nutrition: Goal: Adequate nutrition will be maintained Outcome: Progressing   Problem: Coping: Goal: Level of anxiety will decrease Outcome: Progressing   Problem: Elimination: Goal: Will not experience complications related to bowel motility Outcome: Progressing Goal: Will not experience complications related to urinary retention Outcome: Progressing   Problem: Pain Managment: Goal: General experience of comfort will improve and/or be controlled Outcome: Progressing    Problem: Safety: Goal: Ability to remain free from injury will improve Outcome: Progressing   Problem: Skin Integrity: Goal: Risk for impaired skin integrity will decrease Outcome: Progressing   Problem: Education: Goal: Knowledge of the prescribed therapeutic regimen will improve Outcome: Progressing   Problem: Activity: Goal: Risk for activity intolerance will decrease Outcome: Progressing   Problem: Cardiac: Goal: Ability to maintain an adequate cardiac output will improve Outcome: Progressing   Problem: Coping: Goal: Level of anxiety will decrease Outcome: Progressing   Problem: Fluid Volume: Goal: Risk for excess fluid volume will decrease Outcome: Progressing   Problem: Clinical Measurements: Goal: Ability to maintain clinical measurements within normal limits will improve Outcome: Progressing Goal: Will remain free from infection Outcome: Progressing   Problem: Respiratory: Goal: Will regain and/or maintain adequate ventilation Outcome: Progressing   Problem: Education: Goal: Knowledge of the prescribed therapeutic regimen will improve Outcome: Progressing   Problem: Activity: Goal: Risk for activity intolerance will decrease Outcome: Progressing   Problem: Cardiac: Goal: Ability to maintain an adequate cardiac output will improve Outcome: Progressing   Problem: Coping: Goal: Level of anxiety will decrease Outcome: Progressing   Problem: Fluid Volume: Goal: Risk for excess fluid volume will decrease Outcome: Progressing

## 2024-01-22 NOTE — Plan of Care (Signed)
  Problem: Cardiovascular: Goal: Ability to achieve and maintain adequate cardiovascular perfusion will improve Outcome: Progressing Goal: Vascular access site(s) Level 0-1 will be maintained Outcome: Progressing   Problem: Education: Goal: Knowledge of General Education information will improve Description: Including pain rating scale, medication(s)/side effects and non-pharmacologic comfort measures Outcome: Progressing   Problem: Health Behavior/Discharge Planning: Goal: Ability to manage health-related needs will improve Outcome: Progressing   Problem: Clinical Measurements: Goal: Respiratory complications will improve Outcome: Progressing   Problem: Nutrition: Goal: Adequate nutrition will be maintained Outcome: Progressing   Problem: Pain Managment: Goal: General experience of comfort will improve and/or be controlled Outcome: Progressing   Problem: Safety: Goal: Ability to remain free from injury will improve Outcome: Progressing

## 2024-01-22 NOTE — Progress Notes (Signed)
 Inpatient Rehab Admissions Coordinator:   Per PT today, patient no longer needs CIR level of care. Patient will plan to discharge home.  Signing off.   Rehab Admissons Coordinator Heath Badon, [place], [place] [phone]

## 2024-01-23 ENCOUNTER — Inpatient Hospital Stay (HOSPITAL_COMMUNITY)

## 2024-01-23 DIAGNOSIS — I5043 Acute on chronic combined systolic (congestive) and diastolic (congestive) heart failure: Secondary | ICD-10-CM | POA: Diagnosis not present

## 2024-01-23 LAB — PROTIME-INR
INR: 2 — ABNORMAL HIGH (ref 0.8–1.2)
Prothrombin Time: 24 s — ABNORMAL HIGH (ref 11.4–15.2)

## 2024-01-23 LAB — CBC
HCT: 30.7 % — ABNORMAL LOW (ref 39.0–52.0)
Hemoglobin: 9.9 g/dL — ABNORMAL LOW (ref 13.0–17.0)
MCH: 26 pg (ref 26.0–34.0)
MCHC: 32.2 g/dL (ref 30.0–36.0)
MCV: 80.6 fL (ref 80.0–100.0)
Platelets: 445 K/uL — ABNORMAL HIGH (ref 150–400)
RBC: 3.81 MIL/uL — ABNORMAL LOW (ref 4.22–5.81)
RDW: 21.6 % — ABNORMAL HIGH (ref 11.5–15.5)
WBC: 16.3 K/uL — ABNORMAL HIGH (ref 4.0–10.5)
nRBC: 0 % (ref 0.0–0.2)

## 2024-01-23 LAB — LACTATE DEHYDROGENASE: LDH: 213 U/L (ref 105–235)

## 2024-01-23 LAB — BASIC METABOLIC PANEL WITH GFR
Anion gap: 13 (ref 5–15)
BUN: 20 mg/dL (ref 6–20)
CO2: 20 mmol/L — ABNORMAL LOW (ref 22–32)
Calcium: 8.5 mg/dL — ABNORMAL LOW (ref 8.9–10.3)
Chloride: 96 mmol/L — ABNORMAL LOW (ref 98–111)
Creatinine, Ser: 1.28 mg/dL — ABNORMAL HIGH (ref 0.61–1.24)
GFR, Estimated: >60 mL/min (ref >60)
Glucose, Bld: 87 mg/dL (ref 70–99)
Potassium: 3.9 mmol/L (ref 3.5–5.1)
Sodium: 129 mmol/L — ABNORMAL LOW (ref 135–145)

## 2024-01-23 LAB — GLUCOSE, CAPILLARY
Glucose-Capillary: 109 mg/dL — ABNORMAL HIGH (ref 70–99)
Glucose-Capillary: 132 mg/dL — ABNORMAL HIGH (ref 70–99)
Glucose-Capillary: 116 mg/dL — ABNORMAL HIGH (ref 70–99)
Glucose-Capillary: 128 mg/dL — ABNORMAL HIGH (ref 70–99)

## 2024-01-23 LAB — COOXEMETRY PANEL
Carboxyhemoglobin: 2.9 % — ABNORMAL HIGH (ref 0.5–1.5)
Methemoglobin: <0.7 % (ref 0.0–1.5)
O2 Saturation: 58.7 %
Total hemoglobin: 10.4 g/dL — ABNORMAL LOW (ref 12.0–16.0)

## 2024-01-23 LAB — MAGNESIUM: Magnesium: 1.9 mg/dL (ref 1.7–2.4)

## 2024-01-23 MED ORDER — ACETAMINOPHEN 325 MG PO TABS
650.0000 mg | ORAL_TABLET | Freq: Four times a day (QID) | ORAL | Status: DC | PRN
  Administered 2024-01-23: 650 mg via ORAL
  Filled 2024-01-23: qty 2

## 2024-01-23 MED ORDER — LOSARTAN POTASSIUM 25 MG PO TABS
25.0000 mg | ORAL_TABLET | Freq: Every day | ORAL | Status: DC
  Administered 2024-01-23 – 2024-01-24 (×2): 25 mg via ORAL
  Filled 2024-01-23 (×2): qty 1

## 2024-01-23 NOTE — Progress Notes (Addendum)
 Patient ID: Philip Monroe, male   DOB: [birth year], 48 y.o.   MRN: [id number]   Advanced Heart Failure Rounding Note  Chief Complaint: s/p HM3 Implant Subjective:    10/30 S/P HMIII 11/4 Ramp Echo Speed increased 5600  Co-ox 59%.  MAP 80s. sCr stable 1.52>1.28. Hgb stable. Repeat ramp today.   Feels ok just complaining of pain around surgical site. Denies CP/SOB. Has been working on ambulation. Appetite continues to improve.   LVAD Interrogation HM III: Speed: 5550 Flow: 4.8 PI: 4.7 Power: 4.3. on batteries, unable to fully interrogate.   Objective:    Vital Signs:   Temp:  [98 F (36.7 C)-101.1 F (38.4 C)] 98.7 F (37.1 C) (11/11 1054) Pulse Rate:  [80-86] 82 (11/11 0837) Resp:  [18-20] 18 (11/11 1054) BP: (91-119)/(77-105) 91/77 (11/11 1054) SpO2:  [94 %-98 %] 97 % (11/11 1054) Last BM Date : 01/22/24 MAP 80s-90s  Weight change: Filed Weights   01/19/24 0500 01/20/24 0620 01/21/24 0528  Weight: (!) 142.1 kg (!) 144.2 kg (!) 143 kg   Intake/Output:  Intake/Output Summary (Last 24 hours) at 01/23/2024 1215 Last data filed at 01/22/2024 2000 Gross per 24 hour  Intake 410 ml  Output --  Net 410 ml    Physical Exam  General:  Well appearing. No resp difficulty Neck:  JVP flat.  Cor: Mechanical heart sounds with LVAD hum present. Lungs: Clear Driveline: C/D/I; securement device intact and driveline incorporated Extremities: no edema Neuro: alert & oriented x3. Affect pleasant   Tele: NSR 80s, 17-28 PVCs/hr (Personally reviewed)    Labs: Basic Metabolic Panel: Recent Labs  Lab 01/17/24 0404 01/17/24 0433 01/18/24 0508 01/19/24 0505 01/20/24 0445 01/21/24 0510 01/22/24 0445 01/23/24 0425  NA 125*   < > 125* 126* 128* 130* 129* 129*  K 3.2*   < > 3.8 3.7 3.6 3.7 3.6 3.9  CL 85*   < > 90* 92* 94* 96* 96* 96*  CO2 26   < > 23 21* 21* 21* 23 20*  GLUCOSE 268*   < > 115* 102* 87 85 87 87  BUN 22*   < > 19 24* 29* 31* 28* 20  CREATININE 1.14   < > 0.99  1.22 1.37* 1.51* 1.52* 1.28*  CALCIUM 8.2*   < > 8.9 8.3* 8.3* 8.0* 8.4* 8.5*  MG 2.0  --  2.2  --  2.2 2.0 2.1 1.9  PHOS 3.0  --  4.1  --   --   --   --   --    < > = values in this interval not displayed.   Liver Function Tests: Recent Labs  Lab 01/18/24 0508  AST 26  ALT 17  ALKPHOS 85  BILITOT 1.0  PROT 6.7  ALBUMIN 2.5*   No results for input(s): LIPASE, AMYLASE in the last 168 hours. No results for input(s): AMMONIA in the last 168 hours.  CBC: Recent Labs  Lab 01/17/24 0404 01/17/24 0433 01/18/24 0508 01/19/24 0540 01/20/24 0630 01/21/24 0510 01/22/24 0445 01/23/24 0425  WBC 13.4*  --  21.9* 23.1* 27.1* 16.6* 14.7* 16.3*  NEUTROABS 8.8*  --  16.4*  --   --   --   --   --   HGB 10.1*   < > 11.0* 11.3* 10.7* 9.9* 10.1* 9.9*  HCT 30.9*   < > 34.3* 35.1* 32.3* 30.8* 31.2* 30.7*  MCV 79.8*  --  80.0 80.0 78.0* 79.6* 79.2* 80.6  PLT 185  --  256 349 375 389 431* 445*   < > = values in this interval not displayed.   BNP (last 3 results) Recent Labs    01/05/24 1907 01/12/24 0500 01/18/24 0508  BNP 674.8* 623.8* 1,193.0*   Medications:    Scheduled Medications:  (feeding supplement) PROSource Plus  30 mL Oral Daily   amiodarone   200 mg Oral Daily   bisacodyl  10 mg Oral Daily   Or   bisacodyl  10 mg Rectal Daily   Chlorhexidine  Gluconate Cloth  6 each Topical Daily   colchicine   0.6 mg Oral Daily   digoxin   0.125 mg Oral Daily   docusate sodium  200 mg Oral Daily   empagliflozin   10 mg Oral Daily   ezetimibe   10 mg Oral Daily   gabapentin  200 mg Oral QAC breakfast   gabapentin  600 mg Oral Q2000   insulin  aspart  0-24 Units Subcutaneous TID WC   insulin  glargine-yfgn  18 Units Subcutaneous Daily   lidocaine   1 patch Transdermal Q24H   mexiletine  150 mg Oral Q8H   mirtazapine  7.5 mg Oral QHS   pantoprazole  40 mg Oral Daily   sodium chloride  flush  10-40 mL Intracatheter Q12H   sodium chloride  flush  3 mL Intravenous Q12H   spironolactone    25 mg Oral Daily   warfarin  5 mg Oral q1600   Warfarin - Pharmacist Dosing Inpatient   Does not apply q1600    Infusions:  piperacillin-tazobactam (ZOSYN)  IV 3.375 g (01/23/24 0630)    PRN Medications: acetaminophen , dextrose , morphine  injection, ondansetron  (ZOFRAN ) IV, mouth rinse, oxyCODONE , simethicone , sodium chloride  flush, sodium chloride  flush, traMADol   Plan/Discussion:    1. Chronic systolic heart failure: - combined ischemic and nonischemic: -Mentions of compaction CM from [facility] cards notes. PVCs may also contribute, 25.3% PVCs with NSVT on Zio monitor in 5/25 - RHC 10/25 with low filling pressures and reduced CI (1.9 TD, 2.2 Fick). - NYHA class IV symptoms on admission.  - S/P HM3 LVAD Implant - intra-op TEE with normal RV function (per anesthesia report).  - 11/4 Ramp Echo Speed increased 5600.  - Appears dry. No need for diuretics.  - Stable off milrinone , co-ox 59% - Continue spironolactone  to 25 - Continue digoxin  0.125 mcg - Continue jardiance  10 mg daily  - MAP 80s; plan to start losartan  soon - Continue warfarin. INR 2, goal 2-2.5 - Repeat ramp echo today  2 CAD:   - LHC 5/24 at [facility]: severe multivessel CAD including 80% mid RCA, 100% RPDA, 100% mid LCx, 80% OM1, 80% mid-distal LAD.  - LHC 9/25: occluded PDA and mid AV LCx, 90% ramus, severe diffuse disease in right PLV, nonobstructive LAD disease. With markedly low EF and without severe LAD disease, CABG nor a durable option.  - No ASA given anticoagulation use and stable CAD.  - Continue Zetia .   3. PVCs:  - Zio 5/25 with 25.3% (prev did not tolerate amiodarone  well, on mexiletine) - increased burden post-op - Continue amiodarone  200 mg daily - Continue mexilitine 150 mg every 8 hours, may need to increase, burden seems higher today  4. Hyponatremia - Mild/stable 129.   5. ID: WBCs 16K today.  UA and C diff negative, sputum cultures with inadequate sample. Blood cultures NGTD. - tMax 101.1  overnight - Off zyvox given negative MRSA swab - Continue zosyn for empiric HAP coverage  6. Gout - Colchicine  restarted 01/22/24  Addendum 01/23/24 12:43  PM Per LVAD coordinator RAMP rescheduled for tomorrow morning.   I reviewed the LVAD parameters from today, and compared the results to the patient's prior recorded data.  No programming changes were made.  The LVAD is functioning within specified parameters.  The patient performs LVAD self-test daily.  LVAD interrogation was negative for any significant power changes, alarms or PI events/speed drops.  LVAD equipment check completed and is in good working order.  Back-up equipment present.   LVAD education done on emergency procedures and precautions and reviewed exit site care.   Beckey LITTIE Coe, NP 12:15 PM  VAD Team Pager [phone] (7am - 7am)  Advanced Heart Failure Team Pager [phone] (M-F; 7a - 5p)  Please contact [facility] for night-coverage after hours (4p -7a ) and weekends on [email or web address]

## 2024-01-23 NOTE — Plan of Care (Signed)
  Problem: Activity: Goal: Ability to return to baseline activity level will improve Outcome: Progressing   Problem: Cardiovascular: Goal: Ability to achieve and maintain adequate cardiovascular perfusion will improve Outcome: Progressing   Problem: Health Behavior/Discharge Planning: Goal: Ability to safely manage health-related needs after discharge will improve Outcome: Progressing   Problem: Education: Goal: Knowledge of General Education information will improve Description: Including pain rating scale, medication(s)/side effects and non-pharmacologic comfort measures Outcome: Progressing   Problem: Clinical Measurements: Goal: Ability to maintain clinical measurements within normal limits will improve Outcome: Progressing Goal: Respiratory complications will improve Outcome: Progressing Goal: Cardiovascular complication will be avoided Outcome: Progressing   Problem: Nutrition: Goal: Adequate nutrition will be maintained Outcome: Progressing   Problem: Pain Managment: Goal: General experience of comfort will improve and/or be controlled Outcome: Progressing   Problem: Safety: Goal: Ability to remain free from injury will improve Outcome: Progressing

## 2024-01-23 NOTE — Progress Notes (Signed)
 LVAD Coordinator Rounding Note:  Admitted 12/25/23 due to CHF and planned teeth extractions prior to VAD implant.   HM3 LVAD implanted on 01/11/24 by BS under DT criteria.  Pt sitting in bed eating breakfast on my arrival. Denies complaints this morning.   WBC 16.3 today.  Plan for RAMP echo tomorrow AM at 0900.   All home equipment has arrived. Discharge education completed 11/10. See separate note for documentation. Plan for pt's daughter to change drive line dressing tomorrow at 11:00.   Vital signs: Temp: 98.7 HR: 86 Doppler Pressure: 88 Automatic BP: 93/80 (86) O2 Sat: 94% on RA Wt: 319>349.2>334.2>335.1>324>316.8>316.1> 319.8>313.2>315.3 lbs    LVAD interrogation reveals:  Speed: 5600 Flow: 4.8 Power: 4.3 w PI: 5.2  Alarms: none Events: 5 PI events so far today  Hematocrit: 31 Fixed speed: 5600 Low speed limit: 5300  Drive Line: Existing VAD dressing removed and site care performed using sterile technique. Drive line exit site cleaned with Chlora prep applicators x 2, allowed to dry, and gauze dressing with Silverlon patch applied. Exit site healing and unincorporated, the velour is fully implanted at exit site. 1 suture around red rubber intact. No redness, tenderness, foul odor or rash noted. moderate amount of normal fatty necrosis drainage. Drive line anchor re-applied. Increase dressing changes back to daily. Next dressing change 01/24/24 by VAD coordinator or nurse champion only.      Labs:  LDH trend: 274>292>277>253>282>276>243>213  INR trend: 1.2>1.4>1.5>1.6>1.7>1.9>2.1>2.0  Anticoagulation Plan: -INR Goal: 2-2.5 -ASA Dose: none - Coumadin dosing per pharmacy  Blood Products:  - <intra-op/post-op> 01/11/24 - 4 FFP - 625 cc of cell saver  Device: -n/a -Therapies:  Arrythmias: frequent  PVCs in OR  Respiratory:  Extubated 10/31  Infection: 01/18/24: Resp culture>>not suitable for testing  Renal:  -CRT:  1.35>1.09>1.28>1.14>0.99>1.22>1.52>1.28  Gtts: Amio 30 mg/hr---OFF Milrinone  0.125 mcg/kg/min--OFF  Adverse Events on VAD:  Patient Education: Education complete see separate note for details Daughter will need to change the dressing 1 more time prior to being checked off  Plan/Recommendations:  1. Please page VAD coordinator for any equipment issues or VAD alarms, as well as for any procedures on VAD pts. 2. Daily driveline dressing changes by VAD coordinator or nurse champion  Isaiah Knoll RN VAD Coordinator  Office: [phone]  24/7 Pager: [phone]

## 2024-01-23 NOTE — Progress Notes (Signed)
 PT Cancellation Note  Patient Details Name: Philip Monroe MRN: [id number] DOB: 1975-10-28   Cancelled Treatment:    Reason Eval/Treat Not Completed: Other (comment) (Pt refused as he was doing a test for his LVAD equipment. He has a fever per nurse since last night as well. He states he may get up later for refused at this time.  Offered gait belt for home for family to use if needed and pt declines. Doubtful that gait belt needed as well as he is doing.)   [name] [name] [name] 01/23/2024, 11:12 AM [name] [name] M,PT Acute Rehab Services [phone]

## 2024-01-23 NOTE — Plan of Care (Signed)
  Problem: Education: Goal: Understanding of CV disease, CV risk reduction, and recovery process will improve Outcome: Progressing Goal: Individualized Educational Video(s) Outcome: Progressing   Problem: Activity: Goal: Ability to return to baseline activity level will improve Outcome: Progressing   Problem: Cardiovascular: Goal: Ability to achieve and maintain adequate cardiovascular perfusion will improve Outcome: Progressing Goal: Vascular access site(s) Level 0-1 will be maintained Outcome: Progressing   Problem: Health Behavior/Discharge Planning: Goal: Ability to safely manage health-related needs after discharge will improve Outcome: Progressing   Problem: Education: Goal: Knowledge of General Education information will improve Description: Including pain rating scale, medication(s)/side effects and non-pharmacologic comfort measures Outcome: Progressing   Problem: Health Behavior/Discharge Planning: Goal: Ability to manage health-related needs will improve Outcome: Progressing   Problem: Clinical Measurements: Goal: Ability to maintain clinical measurements within normal limits will improve Outcome: Progressing Goal: Will remain free from infection Outcome: Progressing Goal: Diagnostic test results will improve Outcome: Progressing Goal: Respiratory complications will improve Outcome: Progressing Goal: Cardiovascular complication will be avoided Outcome: Progressing   Problem: Activity: Goal: Risk for activity intolerance will decrease Outcome: Progressing   Problem: Nutrition: Goal: Adequate nutrition will be maintained Outcome: Progressing   Problem: Coping: Goal: Level of anxiety will decrease Outcome: Progressing   Problem: Elimination: Goal: Will not experience complications related to bowel motility Outcome: Progressing Goal: Will not experience complications related to urinary retention Outcome: Progressing   Problem: Pain Managment: Goal:  General experience of comfort will improve and/or be controlled Outcome: Progressing   Problem: Safety: Goal: Ability to remain free from injury will improve Outcome: Progressing   Problem: Skin Integrity: Goal: Risk for impaired skin integrity will decrease Outcome: Progressing   Problem: Education: Goal: Knowledge of the prescribed therapeutic regimen will improve Outcome: Progressing   Problem: Activity: Goal: Risk for activity intolerance will decrease Outcome: Progressing   Problem: Cardiac: Goal: Ability to maintain an adequate cardiac output will improve Outcome: Progressing   Problem: Coping: Goal: Level of anxiety will decrease Outcome: Progressing   Problem: Fluid Volume: Goal: Risk for excess fluid volume will decrease Outcome: Progressing   Problem: Clinical Measurements: Goal: Ability to maintain clinical measurements within normal limits will improve Outcome: Progressing Goal: Will remain free from infection Outcome: Progressing   Problem: Respiratory: Goal: Will regain and/or maintain adequate ventilation Outcome: Progressing   Problem: Education: Goal: Knowledge of the prescribed therapeutic regimen will improve Outcome: Progressing   Problem: Activity: Goal: Risk for activity intolerance will decrease Outcome: Progressing   Problem: Cardiac: Goal: Ability to maintain an adequate cardiac output will improve Outcome: Progressing   Problem: Coping: Goal: Level of anxiety will decrease Outcome: Progressing

## 2024-01-23 NOTE — TOC Progression Note (Signed)
 Transition of Care [facility]) - Progression Note    Patient Details  Name: Philip Monroe MRN: [id number] Date of Birth: June 02, 1975  Transition of [facility]) CM/SW Contact  Justina Delcia Czar, RN Phone Number: [phone] 01/23/2024, 1:46 PM  Clinical Narrative:    Patient remains on IV abx for Asp PNA. PT/OT working with patient, pt wants to see how he progress. CIR following for IP rehab.   If pt dc home will need DME.  Chart reviewed for discharge readiness, patient not medically stable for d/c. Inpatient CM/CSW will continue to monitor pt's advancement through interdisciplinary progression rounds.   If new pt transition needs arise, MD please place a TOC consult.    Expected Discharge Plan: IP Rehab Facility Barriers to Discharge: Continued Medical Work up               Expected Discharge Plan and Services   Discharge Planning Services: CM Consult   Living arrangements for the past 2 months: Apartment                                       Social Drivers of Health (SDOH) Interventions SDOH Screenings   Food Insecurity: No Food Insecurity (01/06/2024)  Housing: High Risk (01/07/2024)  Transportation Needs: Unmet Transportation Needs (01/07/2024)  Utilities: Not At Risk (01/07/2024)  Financial Resource Strain: Medium Risk (12/13/2023)  Tobacco Use: Medium Risk (01/11/2024)    Readmission Risk Interventions     No data to display

## 2024-01-24 ENCOUNTER — Encounter (HOSPITAL_COMMUNITY): Payer: Self-pay | Admitting: *Deleted

## 2024-01-24 ENCOUNTER — Other Ambulatory Visit (HOSPITAL_COMMUNITY): Payer: Self-pay

## 2024-01-24 ENCOUNTER — Inpatient Hospital Stay (HOSPITAL_COMMUNITY)

## 2024-01-24 DIAGNOSIS — I5022 Chronic systolic (congestive) heart failure: Secondary | ICD-10-CM

## 2024-01-24 DIAGNOSIS — I5043 Acute on chronic combined systolic (congestive) and diastolic (congestive) heart failure: Secondary | ICD-10-CM | POA: Diagnosis not present

## 2024-01-24 LAB — CBC
HCT: 30.9 % — ABNORMAL LOW (ref 39.0–52.0)
Hemoglobin: 9.9 g/dL — ABNORMAL LOW (ref 13.0–17.0)
MCH: 26.1 pg (ref 26.0–34.0)
MCHC: 32 g/dL (ref 30.0–36.0)
MCV: 81.3 fL (ref 80.0–100.0)
Platelets: 430 K/uL — ABNORMAL HIGH (ref 150–400)
RBC: 3.8 MIL/uL — ABNORMAL LOW (ref 4.22–5.81)
RDW: 21.6 % — ABNORMAL HIGH (ref 11.5–15.5)
WBC: 12.3 K/uL — ABNORMAL HIGH (ref 4.0–10.5)
nRBC: 0 % (ref 0.0–0.2)

## 2024-01-24 LAB — LACTATE DEHYDROGENASE: LDH: 225 U/L (ref 105–235)

## 2024-01-24 LAB — PROTIME-INR
INR: 1.8 — ABNORMAL HIGH (ref 0.8–1.2)
Prothrombin Time: 22.1 s — ABNORMAL HIGH (ref 11.4–15.2)

## 2024-01-24 LAB — BASIC METABOLIC PANEL WITH GFR
Anion gap: 11 (ref 5–15)
BUN: 20 mg/dL (ref 6–20)
CO2: 23 mmol/L (ref 22–32)
Calcium: 8.4 mg/dL — ABNORMAL LOW (ref 8.9–10.3)
Chloride: 99 mmol/L (ref 98–111)
Creatinine, Ser: 1.25 mg/dL — ABNORMAL HIGH (ref 0.61–1.24)
GFR, Estimated: >60 mL/min (ref >60)
Glucose, Bld: 115 mg/dL — ABNORMAL HIGH (ref 70–99)
Potassium: 3.7 mmol/L (ref 3.5–5.1)
Sodium: 133 mmol/L — ABNORMAL LOW (ref 135–145)

## 2024-01-24 LAB — ECHOCARDIOGRAM LIMITED
Est EF: <20
Height: 75 in
Weight: 5104 [oz_av]

## 2024-01-24 LAB — COOXEMETRY PANEL
Carboxyhemoglobin: 3 % — ABNORMAL HIGH (ref 0.5–1.5)
Methemoglobin: <0.7 % (ref 0.0–1.5)
O2 Saturation: 67.2 %
Total hemoglobin: 10 g/dL — ABNORMAL LOW (ref 12.0–16.0)

## 2024-01-24 LAB — GLUCOSE, CAPILLARY
Glucose-Capillary: 95 mg/dL (ref 70–99)
Glucose-Capillary: 87 mg/dL (ref 70–99)

## 2024-01-24 LAB — MAGNESIUM: Magnesium: 2.2 mg/dL (ref 1.7–2.4)

## 2024-01-24 MED ORDER — METFORMIN HCL 500 MG PO TABS
500.0000 mg | ORAL_TABLET | Freq: Every day | ORAL | 1 refills | Status: DC
  Filled 2024-01-24: qty 30, 30d supply, fill #0
  Filled 2024-02-23: qty 30, 30d supply, fill #1
  Filled 2024-02-23: qty 30, 30d supply, fill #0

## 2024-01-24 MED ORDER — WARFARIN SODIUM 7.5 MG PO TABS
7.5000 mg | ORAL_TABLET | Freq: Once | ORAL | Status: AC
  Administered 2024-01-24: 7.5 mg via ORAL
  Filled 2024-01-24: qty 1

## 2024-01-24 MED ORDER — ACCU-CHEK SOFTCLIX LANCETS MISC
1.0000 | 0 refills | Status: AC
  Filled 2024-01-24: qty 100, 25d supply, fill #0

## 2024-01-24 MED ORDER — TRAMADOL HCL 50 MG PO TABS
50.0000 mg | ORAL_TABLET | Freq: Three times a day (TID) | ORAL | 0 refills | Status: DC | PRN
  Filled 2024-01-24: qty 30, 5d supply, fill #0

## 2024-01-24 MED ORDER — TORSEMIDE 20 MG PO TABS
20.0000 mg | ORAL_TABLET | Freq: Every day | ORAL | 1 refills | Status: DC | PRN
  Filled 2024-01-24: qty 30, 30d supply, fill #0

## 2024-01-24 MED ORDER — COLCHICINE 0.6 MG PO TABS
0.6000 mg | ORAL_TABLET | Freq: Every day | ORAL | 5 refills | Status: AC
  Filled 2024-01-24: qty 30, 30d supply, fill #0

## 2024-01-24 MED ORDER — GABAPENTIN 300 MG PO CAPS
600.0000 mg | ORAL_CAPSULE | Freq: Every day | ORAL | 5 refills | Status: AC
  Filled 2024-01-24: qty 60, 30d supply, fill #0
  Filled 2024-02-23: qty 60, 30d supply, fill #1
  Filled 2024-02-23: qty 60, 30d supply, fill #0
  Filled 2024-04-05: qty 60, 30d supply, fill #1

## 2024-01-24 MED ORDER — ACCU-CHEK GUIDE TEST VI STRP
1.0000 | ORAL_STRIP | 0 refills | Status: AC
  Filled 2024-01-24: qty 100, 25d supply, fill #0

## 2024-01-24 MED ORDER — LANCET DEVICE MISC
1.0000 | 0 refills | Status: AC
  Filled 2024-01-24: qty 1, fill #0

## 2024-01-24 MED ORDER — LOSARTAN POTASSIUM 25 MG PO TABS
25.0000 mg | ORAL_TABLET | Freq: Every day | ORAL | 5 refills | Status: DC
  Filled 2024-01-24 – 2024-02-23 (×3): qty 30, 30d supply, fill #0

## 2024-01-24 MED ORDER — OXYCODONE HCL 10 MG PO TABS
10.0000 mg | ORAL_TABLET | Freq: Four times a day (QID) | ORAL | 0 refills | Status: AC | PRN
  Filled 2024-01-24: qty 30, 5d supply, fill #0

## 2024-01-24 MED ORDER — AMOXICILLIN-POT CLAVULANATE 875-125 MG PO TABS
1.0000 | ORAL_TABLET | Freq: Two times a day (BID) | ORAL | Status: DC
  Administered 2024-01-24: 1 via ORAL
  Filled 2024-01-24: qty 1

## 2024-01-24 MED ORDER — BLOOD GLUCOSE MONITOR SYSTEM W/DEVICE KIT
1.0000 | PACK | 0 refills | Status: AC
  Filled 2024-01-24: qty 1, 30d supply, fill #0

## 2024-01-24 MED ORDER — TRAMADOL HCL 50 MG PO TABS
50.0000 mg | ORAL_TABLET | Freq: Three times a day (TID) | ORAL | 0 refills | Status: AC | PRN
  Filled 2024-01-24: qty 42, 7d supply, fill #0

## 2024-01-24 MED ORDER — WARFARIN SODIUM 5 MG PO TABS
ORAL_TABLET | ORAL | 5 refills | Status: DC
  Filled 2024-01-24: qty 45, 30d supply, fill #0
  Filled 2024-02-23: qty 45, 30d supply, fill #1
  Filled 2024-02-23: qty 45, 30d supply, fill #0

## 2024-01-24 MED ORDER — AMOXICILLIN-POT CLAVULANATE 875-125 MG PO TABS
1.0000 | ORAL_TABLET | Freq: Two times a day (BID) | ORAL | 0 refills | Status: AC
  Filled 2024-01-24: qty 3, 2d supply, fill #0

## 2024-01-24 MED ORDER — AMIODARONE HCL 200 MG PO TABS
200.0000 mg | ORAL_TABLET | Freq: Every day | ORAL | 5 refills | Status: DC
  Filled 2024-01-24 – 2024-02-23 (×3): qty 30, 30d supply, fill #0

## 2024-01-24 MED ORDER — PANTOPRAZOLE SODIUM 40 MG PO TBEC
40.0000 mg | DELAYED_RELEASE_TABLET | Freq: Every day | ORAL | 5 refills | Status: DC
  Filled 2024-01-24: qty 30, 30d supply, fill #0
  Filled 2024-02-23: qty 30, 30d supply, fill #1
  Filled 2024-02-23: qty 30, 30d supply, fill #0

## 2024-01-24 MED ORDER — GABAPENTIN 100 MG PO CAPS
200.0000 mg | ORAL_CAPSULE | Freq: Every day | ORAL | 5 refills | Status: DC
  Filled 2024-01-24 – 2024-02-23 (×2): qty 60, 30d supply, fill #0
  Filled 2024-02-23: qty 60, 30d supply, fill #1

## 2024-01-24 NOTE — Progress Notes (Signed)
 Speed  Flow  PI  Power  LVIDD  AI  Aortic opening MR  TR  Septum  RV  VTI (>18cm)  5600  5.3 3.0 4.3 7.9 none 3/5  none  mild Bowing right   19  5700  5.4 3.1 4.4 7.1 none 3/5   Midline, occ sl bow left                                                             Doppler MAP:  Auto cuff BP: 84/59 (75)   Ramp ECHO performed at bedside per Dr Rolan  At completion of ramp study, patients primary controller programmed:  Fixed speed: 5700 Low speed limit: 5400    Isaiah Knoll RN VAD Coordinator  Office: [phone]  24/7 Pager: [phone]

## 2024-01-24 NOTE — TOC Progression Note (Deleted)
 Transition of Care [facility]) - Progression Note    Patient Details  Name: Philip Monroe MRN: [id number] Date of Birth: Aug 02, 1975  Transition of Care [facility]) CM/SW Contact  Mikah, Poss Phone Number: [phone] 01/24/2024, 11:28 AM  Clinical Narrative:   [facility] CSW called and schedule patients hospital follow up appointment for Friday, January 26, 2024 at 9:40 AM.   CSW spoke with patient over the phone who stated that he has transportation at [employer].       Expected Discharge Plan: IP Rehab Facility Barriers to Discharge: Continued Medical Work up               Expected Discharge Plan and Services   Discharge Planning Services: CM Consult   Living arrangements for the past 2 months: Apartment                                       Social Drivers of Health (SDOH) Interventions SDOH Screenings   Food Insecurity: No Food Insecurity (01/06/2024)  Housing: High Risk (01/07/2024)  Transportation Needs: Unmet Transportation Needs (01/07/2024)  Utilities: Not At Risk (01/07/2024)  Financial Resource Strain: Medium Risk (12/13/2023)  Tobacco Use: Medium Risk (01/11/2024)    Readmission Risk Interventions     No data to display

## 2024-01-24 NOTE — TOC Transition Note (Signed)
 Transition of Care [facility]) - Discharge Note   Patient Details  Name: Philip Monroe MRN: [id number] Date of Birth: Dec 20, 1975  [facility]) CM/SW Contact:  Marques, Ericson Phone Number: [phone] 01/24/2024, 12:21 PM   Clinical Narrative:   [facility] CSW called and schedule patients hospital follow up appointment for Friday, January 26, 2024 at 9:40 AM.    CSW spoke with patient over the phone who stated that he has transportation at dc.       Barriers to Discharge: Continued Medical Work up   Patient Goals and CMS Choice Patient states their goals for this hospitalization and ongoing recovery are:: wants to recover          Discharge Placement                       Discharge Plan and Services Additional resources added to the After Visit Summary for     Discharge Planning Services: CM Consult            DME Arranged: Walker rolling with seat DME Agency: [employer] Date DME Agency Contacted: 01/24/24 Time DME Agency Contacted: 1212 Representative spoke with at DME Agency: Zack            Social Drivers of Health (SDOH) Interventions SDOH Screenings   Food Insecurity: No Food Insecurity (01/06/2024)  Housing: High Risk (01/07/2024)  Transportation Needs: Unmet Transportation Needs (01/07/2024)  Utilities: Not At Risk (01/07/2024)  Financial Resource Strain: Medium Risk (12/13/2023)  Tobacco Use: Medium Risk (01/11/2024)     Readmission Risk Interventions     No data to display

## 2024-01-24 NOTE — Progress Notes (Signed)
 VAST consult for PICC removal. Pt in the bed. HOB less than 45*, pt held breath upon line removal. Instructed pt to remain in bed for 30 and monitor and report and s/sx of bleeding. Pressure held for 5 min and instructed to keep pressure drsg CDI for 24hr. Pt VU. Powell Bowler, RN VAST

## 2024-01-24 NOTE — Progress Notes (Signed)
 Discussed with pt and friends IS, sternal precautions, diet, exercise, and CRPII.  We also reviewed smoking cessation. Pt receptive. Will refer to [facility] CRPII.  [phone] Aliene Aris BS, ACSM-CEP 01/24/2024 2:30 PM

## 2024-01-24 NOTE — Progress Notes (Signed)
  Echocardiogram 2D Echocardiogram has been performed.  Koleen KANDICE Popper, RDCS 01/24/2024, 9:28 AM

## 2024-01-24 NOTE — Progress Notes (Signed)
 Physical Therapy Treatment Patient Details Name: Philip Monroe MRN: [id number] DOB: May 06, 1975 Today's Date: 01/24/2024   History of Present Illness Pt is a 48 y.o. male who presented 10/24 for right heart cath with plan for LVAD implantation. S/p dental teeth extraction 10/27. S/p Implantation of HeartMate 3 Left Ventricular Assist Device 10/30 PMH: aortic root dilatation, asthma, CAD, CHF, CKD stage II, gout, DM, HLD, HTN, IVCD, morbid obesity, left ventricular noncompation, NSVT, tobacco abuse.    PT Comments  Pt doing very well with mobility. Following sternal precautions with mobility. Instructed pt in home walking program for exercise. Ready for dc home from PT standpoint. Recommend rollator for community mobility to allow pt to spend extended time in community walking and standing.     If plan is discharge home, recommend the following: Assistance with cooking/housework;Assist for transportation;Help with stairs or ramp for entrance   Can travel by private vehicle        Equipment Recommendations  Rollator (4 wheels) (bariatric)    Recommendations for Other Services       Precautions / Restrictions Precautions Precautions: Sternal;Other (comment) Precaution Booklet Issued: Yes (comment) Recall of Precautions/Restrictions: Intact Precaution/Restrictions Comments: LVAD Restrictions Other Position/Activity Restrictions: Cardiac Sternal Precautions     Mobility  Bed Mobility Overal bed mobility: Modified Independent Bed Mobility: Supine to Sit, Sit to Supine     Supine to sit: Modified independent (Device/Increase time), HOB elevated Sit to supine: Modified independent (Device/Increase time), HOB elevated   General bed mobility comments: Pt following sternal precautions    Transfers Overall transfer level: Modified independent Equipment used: None Transfers: Sit to/from Stand Sit to Stand: Modified independent (Device/Increase time), From elevated surface            General transfer comment: Following sternal precautions    Ambulation/Gait Ambulation/Gait assistance: Supervision Gait Distance (Feet): 200 Feet Assistive device: None Gait Pattern/deviations: Step-through pattern, Decreased stride length, Wide base of support Gait velocity: decr Gait velocity interpretation: 1.31 - 2.62 ft/sec, indicative of limited community ambulator   General Gait Details: supervision for lines   Stairs Stairs: Yes Stairs assistance: Supervision Stair Management: One rail Left, Step to pattern, Forwards, Backwards Number of Stairs: 3 General stair comments: Stepped up/down portable step x 3   Wheelchair Mobility     Tilt Bed    Modified Rankin (Stroke Patients Only)       Balance   Sitting-balance support: No upper extremity supported, Feet supported Sitting balance-Leahy Scale: Good     Standing balance support: No upper extremity supported, During functional activity Standing balance-Leahy Scale: Good                              Communication Communication Communication: No apparent difficulties  Cognition Arousal: Alert Behavior During Therapy: WFL for tasks assessed/performed   PT - Cognitive impairments: No apparent impairments                         Following commands: Intact      Cueing Cueing Techniques: Verbal cues  Exercises      General Comments        Pertinent Vitals/Pain Pain Assessment Faces Pain Scale: Hurts a little bit Pain Location: chest Pain Descriptors / Indicators: Grimacing Pain Intervention(s): Monitored during session    Home Living  Prior Function            PT Goals (current goals can now be found in the care plan section) Acute Rehab PT Goals Patient Stated Goal: go home Progress towards PT goals: Progressing toward goals    Frequency           PT Plan      Co-evaluation              AM-PAC PT 6  Clicks Mobility   Outcome Measure  Help needed turning from your back to your side while in a flat bed without using bedrails?: None Help needed moving from lying on your back to sitting on the side of a flat bed without using bedrails?: None Help needed moving to and from a bed to a chair (including a wheelchair)?: None Help needed standing up from a chair using your arms (e.g., wheelchair or bedside chair)?: None Help needed to walk in hospital room?: None Help needed climbing 3-5 steps with a railing? : A Little 6 Click Score: 23    End of Session   Activity Tolerance: Patient tolerated treatment well Patient left: in bed;with call bell/phone within reach Nurse Communication: Mobility status PT Visit Diagnosis: Other abnormalities of gait and mobility (R26.89);Muscle weakness (generalized) (M62.81);Pain Pain - part of body:  (chest)     Time: 8954-8896 PT Time Calculation (min) (ACUTE ONLY): 18 min  Charges:    $Gait Training: 8-22 mins PT General Charges $$ ACUTE PT VISIT: 1 Visit                     [facility] PT Acute Rehabilitation Services Office [phone]    Rodgers ORN [facility] 01/24/2024, 11:35 AM

## 2024-01-24 NOTE — Progress Notes (Signed)
 PHARMACY - ANTICOAGULATION CONSULT NOTE  Pharmacy Consult for warfarin Indication: LVAD  HM3  Allergies  Allergen Reactions   Lactose Intolerance (Gi) Diarrhea   Statins     myalgias    Patient Measurements: Height: 6' 3 (190.5 cm) Weight: (!) 144.7 kg (319 lb) IBW/kg (Calculated) : 84.5 HEPARIN  DW (KG): 117.7  Vital Signs: Temp: 98.5 F (36.9 C) (11/12 0745) Temp Source: Oral (11/12 0745) BP: 84/68 (11/12 0745) Pulse Rate: 86 (11/12 0839)  Labs: Recent Labs    01/22/24 0445 01/23/24 0425 01/24/24 0415  HGB 10.1* 9.9* 9.9*  HCT 31.2* 30.7* 30.9*  PLT 431* 445* 430*  LABPROT 24.2* 24.0* 22.1*  INR 2.1* 2.0* 1.8*  CREATININE 1.52* 1.28* 1.25*    Estimated Creatinine Clearance: 112.2 mL/min (A) (by C-G formula based on SCr of 1.25 mg/dL (H)).   Medical History: Past Medical History:  Diagnosis Date   Aortic root dilatation    a. 07/2022 Echo: Ao root 4.4cm, Asc Ao 4.0cm.   Asthma    as a child, no problems as an adult, rarely uses inhaler   CAD (coronary artery disease)    a. 08/2017 Cath: LM nl, LAD 43m/d, D1 70ost, LCX 1109m, OM1 90 inf branch, 60 sup branch, OM2 small, RCA 30 diffuse, RPDA 100 w/ L->R collats; b. 07/2022 Echo: LM nl, LAD 44m, D1 80, LCX 158m, OM1 80, RCA 74m, RPDA 100-->No targets for PCI/CABG-->Med rx.   CHF (congestive heart failure) (HCC)    takes lasix    Chronic HFrEF (heart failure with reduced ejection fraction) (HCC)    a. 03/2015 Echo: EF 35-40%; b. 08/2017 Echo: EF 15%; c. 08/2017 cMRI: EF 35%; d. 06/2018 Echo: EF 10-15%; e. 07/2022 Echo: EF 15-20%, grIII DD, mild MR, sev dil LA, mod-sev PH, Ao root 4.4cm, Asc Ao 4.0cm; f. 07/2022 RHC: RA 4, RV 38/3, PA 43/18 (26), PCWP 11. CO/CI 5.3/1.8.   CKD (chronic kidney disease), stage II    Diabetes mellitus without complication (HCC)    type 2   Dyspnea    with exertion   Gout    Hyperlipidemia LDL goal <70    Hypertension    Ischemic cardiomyopathy    a. 03/2015 Echo: EF 35-40%; b. 08/2017 Echo:  EF 15%; c. 08/2017 cMRI: EF 35%; d. 06/2018 Echo: EF 10-15%; e. 07/2022 Echo: EF 15-20%, grIII DD.   IVCD (intraventricular conduction defect)    Left ventricular noncompaction [facility]    Morbid obesity (HCC)    NSVT (nonsustained ventricular tachycardia) (HCC)    a. 09/2022 Event monitor: predominantly sinus rhythm, avg HR 81 (46-167). 17 runs NSVT (fastest/longest 13 beats x 167). Rare PACs, 17.5% PVC burden.   PVC's (premature ventricular contractions)    a. 09/2022 Event Monitor: 17.5% PVC burden.   Statin intolerance    Tobacco abuse     Medications:  Scheduled:   (feeding supplement) PROSource Plus  30 mL Oral Daily   amiodarone   200 mg Oral Daily   bisacodyl  10 mg Oral Daily   Or   bisacodyl  10 mg Rectal Daily   Chlorhexidine  Gluconate Cloth  6 each Topical Daily   colchicine   0.6 mg Oral Daily   digoxin   0.125 mg Oral Daily   docusate sodium  200 mg Oral Daily   empagliflozin   10 mg Oral Daily   ezetimibe   10 mg Oral Daily   gabapentin  200 mg Oral QAC breakfast   gabapentin  600 mg Oral Q2000   insulin  aspart  0-24 Units Subcutaneous TID WC   insulin  glargine-yfgn  18 Units Subcutaneous Daily   lidocaine   1 patch Transdermal Q24H   losartan   25 mg Oral Daily   mexiletine  150 mg Oral Q8H   mirtazapine  7.5 mg Oral QHS   pantoprazole  40 mg Oral Daily   sodium chloride  flush  10-40 mL Intracatheter Q12H   sodium chloride  flush  3 mL Intravenous Q12H   spironolactone   25 mg Oral Daily   warfarin  5 mg Oral q1600   Warfarin - Pharmacist Dosing Inpatient   Does not apply q1600    Assessment: 48 yom who underwent HM3 LVAD implantation on 10/30. Of note had recent teeth extraction on 10/27. Was on apixaban  PTA for hx Afib - LD 10/22.   Warfarin started post-op on 10/31 (INR 1.2).  INR today slightly below goal at 1.8. Hgb 9.9, plt 430, LDH 225. Stopped enox40 11/7. Appetite has improved. Has required ~42.5 mg/week of warfarin.   Goal of Therapy:  INR 2-2.5 Monitor  platelets by anticoagulation protocol: Yes   Plan:  Continue warfarin 7.5 mg tonight Would discharge on warfarin 5 mg daily except 7.5 mg MWF w/ INR check on 17th Monitor daily INR, CBC, and for s/sx of bleeding   Thank you for allowing pharmacy to participate in this patient's care,  Suzen Sour, PharmD, BCCCP Clinical Pharmacist  Phone: [phone] 01/24/2024 9:29 AM  Please check AMION for all [facility] Pharmacy phone numbers After 10:00 PM, call Main Pharmacy [phone]

## 2024-01-24 NOTE — Progress Notes (Addendum)
 LVAD Coordinator Rounding Note:  Admitted 12/25/23 due to CHF and planned teeth extractions prior to VAD implant.   HM3 LVAD implanted on 01/11/24 by BS under DT criteria.  Pt laying in bed on my arrival. States he feels stiff this morning, but otherwise denies complaints.   WBC 12.3 today. Plan to discharge home on PO antibiotics to complete 7 day course.   RAMP echo completed this morning. See separate note for documentation. Speed increased to 5700.   All home equipment has arrived. Discharge education completed 11/10. See separate note for documentation. Plan for pt's daughter to change drive line dressing today at 11:00.   VAD clinic f/u appt scheduled 11/19 at 11:00.   Vital signs: Temp: 98.5 HR: 75 Doppler Pressure: not documented Automatic BP: 84/68 (75) O2 Sat: 94% on RA Wt: 319>349.2>334.2>335.1>324>316.8>316.1> 319.8>313.2>315.3>321.4>319 lbs    LVAD interrogation reveals:  Speed: 5600 Flow: 5.1 Power: 4.4 w PI: 3.5  Alarms: none Events: 8 PI events so far today  Hematocrit: 31 Fixed speed: 5600 Low speed limit: 5300  Drive Line: Exit site care performed by pt's daughter Philip Monroe with Lauraine Ip VAD coordinator supervision. Existing VAD dressing removed and site care performed using sterile technique. Drive line exit site cleaned with Chlora prep applicators x 2, allowed to dry, and gauze dressing with Silverlon patch applied. Exit site healing and unincorporated, the velour is fully implanted at exit site. 1 suture around red rubber intact. No redness, tenderness, foul odor or rash noted. moderate amount of normal fatty necrosis drainage. Drive line anchor re-applied. Advance dressing changes to MWF. Next dressing change 01/26/24 by VAD coordinator, trained caregiver, or nurse champion only.      Labs:  LDH trend: 274>292>277>253>282>276>243>213>225  INR trend: 1.2>1.4>1.5>1.6>1.7>1.9>2.1>2.0>1.8  Anticoagulation Plan: -INR Goal: 2-2.5 -ASA Dose: none -  Coumadin dosing per pharmacy  Blood Products:  - <intra-op/post-op> 01/11/24 - 4 FFP - 625 cc of cell saver  Device: -n/a -Therapies:  Arrythmias: frequent  PVCs in OR  Respiratory:  Extubated 10/31  Infection: 01/18/24: Resp culture>>not suitable for testing  Renal:  -CRT: 1.35>1.09>1.28>1.14>0.99>1.22>1.52>1.28>1.25  Gtts: Amio 30 mg/hr---OFF Milrinone  0.125 mcg/kg/min--OFF  Adverse Events on VAD:  Patient Education: Education complete see separate note for details Daughter has been checked off to independently perform dressing changes per Lauraine Ip RN  Plan/Recommendations:  1. Please page VAD coordinator for any equipment issues or VAD alarms, as well as for any procedures on VAD pts. 2. Daily driveline dressing changes by VAD coordinator or nurse champion  Isaiah Knoll RN VAD Coordinator  Office: [phone]  24/7 Pager: [phone]

## 2024-01-24 NOTE — Plan of Care (Signed)
  Problem: Education: Goal: Understanding of CV disease, CV risk reduction, and recovery process will improve Outcome: Progressing Goal: Individualized Educational Video(s) Outcome: Progressing   Problem: Activity: Goal: Ability to return to baseline activity level will improve Outcome: Progressing   Problem: Cardiovascular: Goal: Ability to achieve and maintain adequate cardiovascular perfusion will improve Outcome: Progressing Goal: Vascular access site(s) Level 0-1 will be maintained Outcome: Progressing   Problem: Health Behavior/Discharge Planning: Goal: Ability to safely manage health-related needs after discharge will improve Outcome: Progressing   Problem: Education: Goal: Knowledge of General Education information will improve Description: Including pain rating scale, medication(s)/side effects and non-pharmacologic comfort measures Outcome: Progressing   Problem: Health Behavior/Discharge Planning: Goal: Ability to manage health-related needs will improve Outcome: Progressing   Problem: Clinical Measurements: Goal: Ability to maintain clinical measurements within normal limits will improve Outcome: Progressing Goal: Will remain free from infection Outcome: Progressing Goal: Diagnostic test results will improve Outcome: Progressing Goal: Respiratory complications will improve Outcome: Progressing Goal: Cardiovascular complication will be avoided Outcome: Progressing   Problem: Activity: Goal: Risk for activity intolerance will decrease Outcome: Progressing   Problem: Nutrition: Goal: Adequate nutrition will be maintained Outcome: Progressing   Problem: Coping: Goal: Level of anxiety will decrease Outcome: Progressing   Problem: Elimination: Goal: Will not experience complications related to bowel motility Outcome: Progressing Goal: Will not experience complications related to urinary retention Outcome: Progressing   Problem: Pain Managment: Goal:  General experience of comfort will improve and/or be controlled Outcome: Progressing   Problem: Safety: Goal: Ability to remain free from injury will improve Outcome: Progressing   Problem: Skin Integrity: Goal: Risk for impaired skin integrity will decrease Outcome: Progressing   Problem: Education: Goal: Knowledge of the prescribed therapeutic regimen will improve Outcome: Progressing   Problem: Activity: Goal: Risk for activity intolerance will decrease Outcome: Progressing   Problem: Cardiac: Goal: Ability to maintain an adequate cardiac output will improve Outcome: Progressing   Problem: Coping: Goal: Level of anxiety will decrease Outcome: Progressing   Problem: Fluid Volume: Goal: Risk for excess fluid volume will decrease Outcome: Progressing   Problem: Clinical Measurements: Goal: Ability to maintain clinical measurements within normal limits will improve Outcome: Progressing Goal: Will remain free from infection Outcome: Progressing   Problem: Respiratory: Goal: Will regain and/or maintain adequate ventilation Outcome: Progressing   Problem: Education: Goal: Knowledge of the prescribed therapeutic regimen will improve Outcome: Progressing   Problem: Activity: Goal: Risk for activity intolerance will decrease Outcome: Progressing   Problem: Cardiac: Goal: Ability to maintain an adequate cardiac output will improve Outcome: Progressing   Problem: Coping: Goal: Level of anxiety will decrease Outcome: Progressing   Problem: Fluid Volume: Goal: Risk for excess fluid volume will decrease Outcome: Progressing   Problem: Clinical Measurements: Goal: Ability to maintain clinical measurements within normal limits will improve Outcome: Progressing Goal: Will remain free from infection Outcome: Progressing   Problem: Respiratory: Goal: Will regain and/or maintain adequate ventilation Outcome: Progressing

## 2024-01-24 NOTE — TOC Progression Note (Signed)
 Transition of Care [facility]) - Progression Note    Patient Details  Name: Philip Monroe MRN: [id number] Date of Birth: Jun 14, 1975  Transition of Care [facility]) CM/SW Contact  Roxie KANDICE Stain, RN Phone Number: 01/24/2024, 12:12 PM  Clinical Narrative:    Patient agreeable to bariatric rollator, referral sent to [facility]   Expected Discharge Plan: IP Rehab Facility Barriers to Discharge: Continued Medical Work up               Expected Discharge Plan and Services   Discharge Planning Services: CM Consult   Living arrangements for the past 2 months: Apartment                 DME Arranged: Walker rolling with seat DME Agency: [employer] Date DME Agency Contacted: 01/24/24 Time DME Agency Contacted: 1212 Representative spoke with at DME Agency: Zack             Social Drivers of Health (SDOH) Interventions SDOH Screenings   Food Insecurity: No Food Insecurity (01/06/2024)  Housing: High Risk (01/07/2024)  Transportation Needs: Unmet Transportation Needs (01/07/2024)  Utilities: Not At Risk (01/07/2024)  Financial Resource Strain: Medium Risk (12/13/2023)  Tobacco Use: Medium Risk (01/11/2024)    Readmission Risk Interventions     No data to display

## 2024-01-24 NOTE — Progress Notes (Signed)
 Per RN. Patient is requesting PICC line to be pulled after 12. This nurse requested that RN place consult when patient is ready for IV team. Powell Bowler, RN VAST

## 2024-01-24 NOTE — Progress Notes (Signed)
 Discharge equipment includes: 1. Two system controllers. 2. Mobile Power Unit (MPU) with 20' patient cable 3. One universal magazine features editor (UBC) 4. Eight fully charged batteries  5. Four battery clips 6. One travel case 7. One holster vest 8. Wearable accessory package 9. Daily dressing kits & anchors   VAD Education:   1. Reviewed importance of having a 24 hour caregiver 2. Reviewed dressing change frequency 3. Reviewed when to call the VAD pager and made sure they have phone number in their phone.  4. Reviewed importance of changing one power source at a time 5. Reviewed importance of carrying black emergency bag containing backup controller, 2 batteries, and 2 battery clips, everywhere 6. Reviewed importance of placing mobile power unit (MPU) and batteries on bedside table with a flashlight. Talked about what to do in case of power failure. Reminded to make sure the outlets that equipment is plugged into are not controlled by a light switch.  7. Reviewed importance of using anchors to hold drive line in place, to prevent accidental pulling, or dislodgement of drive line.  8. Patient and family agreed to pick up prescriptions. Stressed importance of taking Warfarin daily in the evening. Stressed importance of taking all prescribed medications as written 9. First clinic visit bring all medications and VAD log.    Isaiah Knoll RN VAD Coordinator  Office: [phone]  24/7 Pager: [phone]

## 2024-01-24 NOTE — Progress Notes (Signed)
 Patient ID: Philip Monroe, male   DOB: 23-Mar-1975, 48 y.o.   MRN: [id number]   Advanced Heart Failure Rounding Note  Chief Complaint: s/p HM3 Implant Subjective:    10/30 S/P HMIII 11/4 Ramp Echo Speed increased 5600 11/12 Ramp echo, speed increased to 5700.  Septum midline at this speed, aortic valve opens every other beat.  Moderate RV dysfunction, IVC not dilated.   Co-ox 67%.  MAP 80s-90s. sCr stable 1.52>1.28>1.25. Hgb stable.   Feels good, has been walking.  Still requiring oxycodone  for pocket pain.   LVAD Interrogation HM III: Speed: 5500 Flow: 4.8 PI: 4.7 Power: 4.3. 8 PI events.   Objective:    Vital Signs:   Temp:  [97.6 F (36.4 C)-99.4 F (37.4 C)] 98.5 F (36.9 C) (11/12 0745) Pulse Rate:  [64-86] 86 (11/12 0839) Resp:  [18-20] 19 (11/12 0745) BP: (84-113)/(68-90) 84/68 (11/12 0745) SpO2:  [93 %-97 %] 97 % (11/12 0745) Weight:  [144.7 kg-145.8 kg] 144.7 kg (11/12 0332) Last BM Date : 01/23/24  VAD MAP 80s-90s  Weight change: Filed Weights   01/21/24 0528 01/23/24 1400 01/24/24 0332  Weight: (!) 143 kg (!) 145.8 kg (!) 144.7 kg   Intake/Output:  Intake/Output Summary (Last 24 hours) at 01/24/2024 0916 Last data filed at 01/24/2024 0400 Gross per 24 hour  Intake 490 ml  Output --  Net 490 ml    Physical Exam  General: Well appearing this am. NAD.  HEENT: Normal. Neck: Supple, JVP 7-8 cm. Carotids OK.  Cardiac:  Mechanical heart sounds with LVAD hum present.  Lungs:  CTAB, normal effort.  Abdomen:  NT, ND, no HSM. No bruits or masses. +BS  LVAD exit site: Well-healed and incorporated. Dressing dry and intact. No erythema or drainage. Stabilization device present and accurately applied. Driveline dressing changed daily per sterile technique. Extremities:  Warm and dry. No cyanosis, clubbing, rash. Trace ankle edema.  Neuro:  Alert & oriented x 3. Cranial nerves grossly intact. Moves all 4 extremities w/o difficulty. Affect pleasant    Tele: NSR 80s,  17-28 PVCs/hr (Personally reviewed)    Labs: Basic Metabolic Panel: Recent Labs  Lab 01/18/24 0508 01/19/24 0505 01/20/24 0445 01/21/24 0510 01/22/24 0445 01/23/24 0425 01/24/24 0415  NA 125*   < > 128* 130* 129* 129* 133*  K 3.8   < > 3.6 3.7 3.6 3.9 3.7  CL 90*   < > 94* 96* 96* 96* 99  CO2 23   < > 21* 21* 23 20* 23  GLUCOSE 115*   < > 87 85 87 87 115*  BUN 19   < > 29* 31* 28* 20 20  CREATININE 0.99   < > 1.37* 1.51* 1.52* 1.28* 1.25*  CALCIUM 8.9   < > 8.3* 8.0* 8.4* 8.5* 8.4*  MG 2.2  --  2.2 2.0 2.1 1.9 2.2  PHOS 4.1  --   --   --   --   --   --    < > = values in this interval not displayed.   Liver Function Tests: Recent Labs  Lab 01/18/24 0508  AST 26  ALT 17  ALKPHOS 85  BILITOT 1.0  PROT 6.7  ALBUMIN 2.5*   No results for input(s): LIPASE, AMYLASE in the last 168 hours. No results for input(s): AMMONIA in the last 168 hours.  CBC: Recent Labs  Lab 01/18/24 0508 01/19/24 0540 01/20/24 0630 01/21/24 0510 01/22/24 0445 01/23/24 0425 01/24/24 0415  WBC 21.9*   < >  27.1* 16.6* 14.7* 16.3* 12.3*  NEUTROABS 16.4*  --   --   --   --   --   --   HGB 11.0*   < > 10.7* 9.9* 10.1* 9.9* 9.9*  HCT 34.3*   < > 32.3* 30.8* 31.2* 30.7* 30.9*  MCV 80.0   < > 78.0* 79.6* 79.2* 80.6 81.3  PLT 256   < > 375 389 431* 445* 430*   < > = values in this interval not displayed.   BNP (last 3 results) Recent Labs    01/05/24 1907 01/12/24 0500 01/18/24 0508  BNP 674.8* 623.8* 1,193.0*   Medications:    Scheduled Medications:  (feeding supplement) PROSource Plus  30 mL Oral Daily   amiodarone   200 mg Oral Daily   bisacodyl  10 mg Oral Daily   Or   bisacodyl  10 mg Rectal Daily   Chlorhexidine  Gluconate Cloth  6 each Topical Daily   colchicine   0.6 mg Oral Daily   digoxin   0.125 mg Oral Daily   docusate sodium  200 mg Oral Daily   empagliflozin   10 mg Oral Daily   ezetimibe   10 mg Oral Daily   gabapentin  200 mg Oral QAC breakfast   gabapentin  600  mg Oral Q2000   insulin  aspart  0-24 Units Subcutaneous TID WC   insulin  glargine-yfgn  18 Units Subcutaneous Daily   lidocaine   1 patch Transdermal Q24H   losartan   25 mg Oral Daily   mexiletine  150 mg Oral Q8H   mirtazapine  7.5 mg Oral QHS   pantoprazole  40 mg Oral Daily   sodium chloride  flush  10-40 mL Intracatheter Q12H   sodium chloride  flush  3 mL Intravenous Q12H   spironolactone   25 mg Oral Daily   warfarin  5 mg Oral q1600   Warfarin - Pharmacist Dosing Inpatient   Does not apply q1600    Infusions:  piperacillin-tazobactam (ZOSYN)  IV 3.375 g (01/24/24 0620)    PRN Medications: acetaminophen , dextrose , morphine  injection, ondansetron  (ZOFRAN ) IV, mouth rinse, oxyCODONE , simethicone , sodium chloride  flush, sodium chloride  flush, traMADol   Plan/Discussion:    1. Chronic systolic heart failure: - combined ischemic and nonischemic: -Mentions of compaction CM from [facility] cards notes. PVCs may also contribute, 25.3% PVCs with NSVT on Zio monitor in 5/25 - RHC 10/25 with low filling pressures and reduced CI (1.9 TD, 2.2 Fick). - NYHA class IV symptoms on admission.  - S/P HM3 LVAD Implant - intra-op TEE with normal RV function (per anesthesia report).  - 11/4 Ramp Echo Speed increased 5600.  - Appears dry. No need for diuretics.  - Stable off milrinone , co-ox 67% - Continue spironolactone  to 25 - Continue digoxin  0.125 mcg - Continue losartan  25 mg daily - Continue jardiance  10 mg daily  - Continue warfarin. INR 1.8, goal 2-2.5 - Ramp today, speed increased to 5700 rpm.   2 CAD:   - LHC 5/24 at [facility]: severe multivessel CAD including 80% mid RCA, 100% RPDA, 100% mid LCx, 80% OM1, 80% mid-distal LAD.  - LHC 9/25: occluded PDA and mid AV LCx, 90% ramus, severe diffuse disease in right PLV, nonobstructive LAD disease. With markedly low EF and without severe LAD disease, CABG nor a durable option.  - No ASA given anticoagulation use and stable CAD.  - Continue Zetia .    3. PVCs:  - Zio 5/25 with 25.3% (prev did not tolerate amiodarone  well, on mexiletine) - increased burden post-op -  Continue amiodarone  200 mg daily - Continue mexilitine 150 mg every 8 hours.  - Still with frequent PVCs, follow for now.   4. Hyponatremia - Improved, 133 today.   5. ID: WBCs 12.3K today.  UA and C diff negative, sputum cultures with inadequate sample. Blood cultures NGTD. - tMax 99.4 overnight - Off zyvox given negative MRSA swab - Continues on zosyn for empiric HAP coverage. Transition to Augmentin at discharge to complete 7 day course.   6. Gout - Colchicine  restarted 01/22/24  He can go home today with close followup in LVAD clinic.   I reviewed the LVAD parameters from today, and compared the results to the patient's prior recorded data.  No programming changes were made.  The LVAD is functioning within specified parameters.  The patient performs LVAD self-test daily.  LVAD interrogation was negative for any significant power changes, alarms or PI events/speed drops.  LVAD equipment check completed and is in good working order.  Back-up equipment present.   LVAD education done on emergency procedures and precautions and reviewed exit site care.   Ezra Shuck 01/24/2024 9:25 AM   VAD Team Pager [phone] (7am - 7am)  Advanced Heart Failure Team Pager [phone] (M-F; 7a - 5p)  Please contact [facility] for night-coverage after hours (4p -7a ) and weekends on [email or web address]

## 2024-01-24 NOTE — Progress Notes (Signed)
 Went over AVS with patient / family members, answered any/all questions, PICC line removed, patient belongings gathered and patient was wheeled out to family vehicle.

## 2024-01-25 LAB — CULTURE, BLOOD (ROUTINE X 2)
Culture: NO GROWTH
Culture: NO GROWTH
Special Requests: ADEQUATE

## 2024-01-30 ENCOUNTER — Other Ambulatory Visit (HOSPITAL_COMMUNITY): Payer: Self-pay | Admitting: Unknown Physician Specialty

## 2024-01-30 DIAGNOSIS — Z95811 Presence of heart assist device: Secondary | ICD-10-CM

## 2024-01-30 DIAGNOSIS — Z7901 Long term (current) use of anticoagulants: Secondary | ICD-10-CM

## 2024-01-31 ENCOUNTER — Ambulatory Visit (HOSPITAL_COMMUNITY)
Admit: 2024-01-31 | Discharge: 2024-01-31 | Disposition: A | Discharge status: Home / Self Care | Payer: MEDICAID | Attending: Internal Medicine | Admitting: Internal Medicine

## 2024-01-31 ENCOUNTER — Ambulatory Visit (HOSPITAL_COMMUNITY): Payer: Self-pay | Admitting: Pharmacist

## 2024-01-31 VITALS — BP 80/0 | HR 93 | Wt 326.2 lb

## 2024-01-31 DIAGNOSIS — I493 Ventricular premature depolarization: Secondary | ICD-10-CM | POA: Diagnosis not present

## 2024-01-31 DIAGNOSIS — Z4802 Encounter for removal of sutures: Secondary | ICD-10-CM | POA: Diagnosis present

## 2024-01-31 DIAGNOSIS — Z7901 Long term (current) use of anticoagulants: Secondary | ICD-10-CM | POA: Diagnosis not present

## 2024-01-31 DIAGNOSIS — I454 Nonspecific intraventricular block: Secondary | ICD-10-CM | POA: Insufficient documentation

## 2024-01-31 DIAGNOSIS — R42 Dizziness and giddiness: Secondary | ICD-10-CM | POA: Insufficient documentation

## 2024-01-31 DIAGNOSIS — Z95811 Presence of heart assist device: Secondary | ICD-10-CM

## 2024-01-31 DIAGNOSIS — Z79899 Other long term (current) drug therapy: Secondary | ICD-10-CM | POA: Diagnosis not present

## 2024-01-31 DIAGNOSIS — I5022 Chronic systolic (congestive) heart failure: Secondary | ICD-10-CM

## 2024-01-31 DIAGNOSIS — H538 Other visual disturbances: Secondary | ICD-10-CM | POA: Insufficient documentation

## 2024-01-31 DIAGNOSIS — I4891 Unspecified atrial fibrillation: Secondary | ICD-10-CM | POA: Diagnosis not present

## 2024-01-31 DIAGNOSIS — Z4801 Encounter for change or removal of surgical wound dressing: Secondary | ICD-10-CM | POA: Insufficient documentation

## 2024-01-31 DIAGNOSIS — Z4509 Encounter for adjustment and management of other cardiac device: Secondary | ICD-10-CM | POA: Insufficient documentation

## 2024-01-31 LAB — BASIC METABOLIC PANEL WITH GFR
Anion gap: 13 (ref 5–15)
BUN: 17 mg/dL (ref 6–20)
CO2: 26 mmol/L (ref 22–32)
Calcium: 9 mg/dL (ref 8.9–10.3)
Chloride: 96 mmol/L — ABNORMAL LOW (ref 98–111)
Creatinine, Ser: 1.3 mg/dL — ABNORMAL HIGH (ref 0.61–1.24)
GFR, Estimated: >60 mL/min (ref >60)
Glucose, Bld: 132 mg/dL — ABNORMAL HIGH (ref 70–99)
Potassium: 4 mmol/L (ref 3.5–5.1)
Sodium: 135 mmol/L (ref 135–145)

## 2024-01-31 LAB — CBC
HCT: 36.7 % — ABNORMAL LOW (ref 39.0–52.0)
Hemoglobin: 11.4 g/dL — ABNORMAL LOW (ref 13.0–17.0)
MCH: 25.5 pg — ABNORMAL LOW (ref 26.0–34.0)
MCHC: 31.1 g/dL (ref 30.0–36.0)
MCV: 82.1 fL (ref 80.0–100.0)
Platelets: 334 K/uL (ref 150–400)
RBC: 4.47 MIL/uL (ref 4.22–5.81)
RDW: 21 % — ABNORMAL HIGH (ref 11.5–15.5)
WBC: 9.4 K/uL (ref 4.0–10.5)
nRBC: 0 % (ref 0.0–0.2)

## 2024-01-31 LAB — LACTATE DEHYDROGENASE: LDH: 211 U/L (ref 105–235)

## 2024-01-31 LAB — PROTIME-INR
INR: 1.6 — ABNORMAL HIGH (ref 0.8–1.2)
Prothrombin Time: 20 s — ABNORMAL HIGH (ref 11.4–15.2)

## 2024-01-31 MED ORDER — DOXYCYCLINE HYCLATE 50 MG PO CAPS: 100.0000 mg | ORAL_CAPSULE | Freq: Two times a day (BID) | ORAL | 0 refills | Status: AC

## 2024-01-31 MED ORDER — TORSEMIDE 20 MG PO TABS
20.0000 mg | ORAL_TABLET | ORAL | 3 refills | Status: DC
  Filled 2024-02-23: qty 30, 60d supply, fill #0

## 2024-01-31 NOTE — Patient Instructions (Addendum)
 Start Doxycycline  100mg  (2 tablets) twice a day Torsemide  20mg  every other day Weigh daily 5 mg daily for Coumdain (1 tablet)

## 2024-01-31 NOTE — Progress Notes (Addendum)
 Patient presents for hospital follow up in VAD Clinic today with his son. Reports no problems with VAD equipment or concerns with drive line.  Patient ambulated independently into clinic without issue. Denies shortness of breath, falls or signs of bleeding. States he is slowly increasing his activity level. Denies pain. Reports intermittent dizziness with periods of blurry vision. Pt describes is as a brief period where his eyes can't focus but it typically resolves quickly. Pt taking 20mg  of Torsemide  daily. Advised by Dr. Bensimhon to decrease to every other day and increase hydration.   Pt taking all medication as prescribed. He reports he has not been checking his blood sugars as prescribed by his PCP.   Pt's daughter changing drive line dressing every other day. Increased drainage noted from exit site. Pt denies trauma. Images from previous dressing changes reviewed with Dr. Cherrie. Orders received for Doxycycline 100mg  BID for 10 days. Will bring pt back to VAD Clinic Monday for dressing change with VAD Coordinators.   EKG obtained and review by Dr. Cherrie in clinic.  Pt given a scale today for home weights and asked to weigh daily.   Chest tube sutures removed today and cleansed with betadine.    Vital Signs:  Doppler Pressure: 80 Automatic BP: 101/77 (85) HR:  93 SPO2: 97 %   Weight: 326.2 lb w/o eqt Discharge  weight: 319 lb  BMI today 40.7 today   VAD Indication: Destination Therapy due to smoking   LVAD assessment: HM III: Speed: 5700 rpms Flow: 5.1 Power: 4.5 w    PI: 3.7 Alarms: no clinical alarms  Events:0*-20  Fixed speed: 5700 Low speed limit: 5400  Primary controller: back up battery due for replacement in  months Secondary controller:  back up battery due for replacement in   months  I reviewed the LVAD parameters from today and compared the results to the patient's prior recorded data. LVAD interrogation was NEGATIVE for significant power changes,  NEGATIVE for clinical alarms and STABLE for PI events/speed drops. No programming changes were made and pump is functioning within specified parameters. Pt is performing daily controller and system monitor self tests along with completing weekly and monthly maintenance for LVAD equipment.   LVAD equipment check completed and is in good working order. Back-up equipment present. Charged back up battery and performed self-test on equipment.    Annual Equipment Maintenance on UBC/PM was performed on 01/11/2024.   Education: Discussed with patient and pt's son  the need to notify the VAD coordinator of any change in VAD numbers from baseline, any alarms (other than low voltage) and any concerns he/she may have about the device, equipment or percutaneous lead. Confirmed that both patient and pt's son. Have emergency contact information. Patient if aware that there is someone available 24/7 to assist with any VAD issues. He/she has been instructed to call 911 for any life-threatening emergencies first then page/call VAD coordinator for assistance with the VAD.   Patient was instructed of importance of protecting percutaneous lead from trauma. Discussed importance of not allowing lead to become bent, twisted, pulled on, torn, or damaged in any way.  He/she is aware that the anchor should always be worn to protect percutaneous lead as well as to prevent system controller from being dropped or falling. Educated patient that trauma to driveline insertion site is major cause of driveline infection and must be avoided at all times.  Patient was informed he MUST always have back-up system controller as well as extra batteries  with him at all times. This is considered an absolute requirement necessary for his safety and well-being.   Exit Site Care:  VAD dressing and anchor removed and site care performed using sterile technique. Drive line exit site cleaned with Chlora prep applicators x 2, allowed to dry, skin  protectant applied and allowed to dry before Sorbaview/gauze dressing and biopatch/Aquacel silver strip re-applied. Exit site remains unincorporated with sutures and red bumper still in place.The velour is fully implanted at exit site. No redness, tenderness, or foul odor noted. Moderate amount of tan/green drainage on previous dressing. Drive line anchor re-applied. Pt denies fever or chills. Driveline dressing is being changed every other day per sterile technique by pt's daughter. Pt denies fever or chills. Pt states they have adequate dressing supplies at home.        Significant Events on VAD Support:     Device: N/A    BP & Labs:  MAP 80 - Doppler is reflecting MAP   Hgb 11.4 - No S/S of bleeding. Specifically denies melena/BRBPR or nosebleeds.   LDH stable at 211 with established baseline of 181- 293. Denies tea-colored urine. No power elevations noted on interrogation.   Plan: Start Doxycycline 100mg  (2 tablets) twice a day for 10 days Torsemide  20mg  every other day Weigh daily 5 mg daily for Coumdain (1 tablet) per Nason RPH Follow up with Dr.Bensimhon in 1 month   Schuyler Lunger RN, BSN VAD Coordinator 24/7 Pager [phone]

## 2024-02-01 ENCOUNTER — Other Ambulatory Visit (HOSPITAL_COMMUNITY): Payer: Self-pay

## 2024-02-01 ENCOUNTER — Telehealth (HOSPITAL_COMMUNITY): Payer: Self-pay | Admitting: Licensed Clinical Social Worker

## 2024-02-01 NOTE — Telephone Encounter (Signed)
 H&V Care Navigation CSW Progress Note  Clinical Social Worker called pt to check in on mental health now that he has transferred home.  States that things have been going well and though LVAD is taking some adjusting he is not struggling with feelings of depression at this time.  Reports he still has resources on local counselors that I had sent him previous and denies need for assistance in contacting them at this time.  States he is unsure if he still feels the need to pursue counseling but is appreciative of CSW reaching out and will inform us  if he needs further assistance or support.  CSW will continue to follow through clinic and assist as needed  Haly Feher H. Torrie Lafavor, LCSW Clinical Social Worker Advanced Heart Failure Clinic Desk#: [phone] Cell#: [phone]

## 2024-02-02 ENCOUNTER — Other Ambulatory Visit (HOSPITAL_COMMUNITY): Payer: Self-pay

## 2024-02-02 DIAGNOSIS — Z95811 Presence of heart assist device: Secondary | ICD-10-CM

## 2024-02-02 DIAGNOSIS — Z7901 Long term (current) use of anticoagulants: Secondary | ICD-10-CM

## 2024-02-04 LAB — MISC LABCORP TEST (SEND OUT): Labcorp test code: 790440

## 2024-02-05 ENCOUNTER — Ambulatory Visit (HOSPITAL_COMMUNITY): Payer: Self-pay | Admitting: Pharmacist

## 2024-02-05 ENCOUNTER — Ambulatory Visit (HOSPITAL_COMMUNITY)
Admission: RE | Admit: 2024-02-05 | Discharge: 2024-02-05 | Disposition: A | Discharge status: Home / Self Care | Payer: MEDICAID | Source: Ambulatory Visit | Attending: Cardiology | Admitting: Cardiology

## 2024-02-05 ENCOUNTER — Ambulatory Visit (HOSPITAL_COMMUNITY)

## 2024-02-05 DIAGNOSIS — Z4801 Encounter for change or removal of surgical wound dressing: Secondary | ICD-10-CM | POA: Diagnosis not present

## 2024-02-05 DIAGNOSIS — Z95811 Presence of heart assist device: Secondary | ICD-10-CM | POA: Diagnosis present

## 2024-02-05 DIAGNOSIS — Z7901 Long term (current) use of anticoagulants: Secondary | ICD-10-CM | POA: Insufficient documentation

## 2024-02-05 LAB — PROTIME-INR
INR: 1.7 — ABNORMAL HIGH (ref 0.8–1.2)
Prothrombin Time: 21.1 s — ABNORMAL HIGH (ref 11.4–15.2)

## 2024-02-05 NOTE — Patient Instructions (Signed)
 Coumadin  dosing per Nason PharmD Continue MWF dressing changes Return to VAD clinic in 1 week for INR & dressing change

## 2024-02-05 NOTE — Addendum Note (Signed)
 Encounter addended by: Berdine Isaiah NOVAK, RN on: 02/05/2024 3:25 PM  Actions taken: Clinical Note Signed

## 2024-02-05 NOTE — Progress Notes (Addendum)
 Pt presents for INR and drive line dressing change in VAD clinic with son today. Denies issues with VAD equipment or drive line.   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 Has not missed any doses. Pt has not had dressing changed since he was in clinic 11/19. Discussed need for MWF dressing changes. He verbalized understanding.   Reports he has 2 Oxycodone  left. Has several Tramadol  tablets remaining. He will plan to transition to Tramadol  tablets for pain management at home.   Drive Line: Existing VAD dressing removed and site care performed using sterile technique. Drive line exit site cleaned with Chlora prep applicators x 2, allowed to dry, and gauze dressing with Silverlon patch applied. Exit site healing and unincorporated, the velour is fully implanted at exit site. 1 suture around red rubber intact. No redness, tenderness, foul odor or rash noted. Moderate amount of sticky thick serous drainage at exit site and on previous dressing. Drive line anchor correctly applied. Continue dressing changes on MWF. Pt states he has adequate dressing supplies at home.      Plan:  Coumadin  dosing per Nason PharmD Continue MWF dressing changes Return to VAD clinic in 1 week for INR & dressing change  Isaiah Knoll RN VAD Coordinator  Office: [phone]  24/7 Pager: [phone]

## 2024-02-05 NOTE — Progress Notes (Addendum)
 \   LVAD Clinic Note    Primary Physician: [facility] HF Cardiologist:  DB  Chief Complaint: HF  HPI:  Philip Monroe is a 48 y.o.. Obese male with multivessel CAD, chronic HFrEF, inoncompaction CM, HTN,  previous ETOH use, noncompliance. S/p HM-3 LVAD 01/11/24   He was followed in the past by [facility] cardiology for iCM/noncompaction CM. RHC 5/24: Normal LV and RV filling pressures. Mild pulmonary hypertension with mild elevation of PVL. Low cardiac index 1.8. LHC 5/24: Severe multivessel CAD including 80% mid RCA, 100% RPDA, 100% mid LCx, 80% OM1, 80% mid-distal LAD. Medically managed.   Underwent HM-3 LVAD on 01/11/24 RAMP echo on discharge day, speed increased to 5700. Septum midline at this speed, aortic valve opens every other beat. Moderate RV dysfunction, IVC not dilated. Discharged 01/24/24  He presents today forf post-discharge LVAD f/u. Overall doing well. Feels lightheaded at times. Taking torsemide  20 mg daily. Denies orthopnea or PND. Having mild drainage from DL. No fevers, chills.  No bleeding, melena or neuro symptoms. No VAD alarms. Taking all meds as prescribed.    LVAD Documentation    01/31/2024  Device Info  LVAD Type: Heartmate III  Date of Implant: 01/24/2024  Therapy Type: Destination Therapy      01/31/2024  Vitals  Heart Rate: 93 BPM  Automatic BP: 101/77  Doppler MAP: 80 mmHg  SpO2: 97 %    Last 3 Weights Weight Weight  01/31/2024 147.963 kg 326 lb 3.2 oz  01/24/2024 144.697 kg 319 lb  01/23/2024 145.786 kg 321 lb 6.4 oz       01/31/2024  LVAD Paramaters  Speed: 5700 RPM  Flow: 5 LPM  PI: 4  Power: 5 Watts  Hematocrit: 31 %  Alarms: none  Events: 0-20  Last Speed Change Date: 01/24/2024  Last Ramp Echo Date: 01/24/2024  Last Right Heart Cath Date: 01/05/2024  Bleeding History: No  Type of Dressing: Other  Type of Dressing: every other day  Annual Maintenance Date: 01/11/2024    Labs    Units  02/05/24 1145 01/31/24 1054 01/24/24 0415 01/23/24 0425  INR  1.7* 1.6* 1.8* 2.0*  LDH U/L  --  211 225 213  HGB g/dL  --  88.5* 9.9* 9.9*  CREATININE mg/dL  --  8.69* 8.74* 8.71*         ROS: All systems negative except what is listed in HPI, PMH and Problem List    Past Medical History:  Diagnosis Date   Aortic root dilatation    a. 07/2022 Echo: Ao root 4.4cm, Asc Ao 4.0cm.   Asthma    as a child, no problems as an adult, rarely uses inhaler   CAD (coronary artery disease)    a. 08/2017 Cath: LM nl, LAD 70m/d, D1 70ost, LCX 166m, OM1 90 inf branch, 60 sup branch, OM2 small, RCA 30 diffuse, RPDA 100 w/ L->R collats; b. 07/2022 Echo: LM nl, LAD 33m, D1 80, LCX 16m, OM1 80, RCA 16m, RPDA 100-->No targets for PCI/CABG-->Med rx.   CHF (congestive heart failure) (HCC)    takes lasix    Chronic HFrEF (heart failure with reduced ejection fraction) (HCC)    a. 03/2015 Echo: EF 35-40%; b. 08/2017 Echo: EF 15%; c. 08/2017 cMRI: EF 35%; d. 06/2018 Echo: EF 10-15%; e. 07/2022 Echo: EF 15-20%, grIII DD, mild MR, sev dil LA, mod-sev PH, Ao root 4.4cm, Asc Ao 4.0cm; f. 07/2022 RHC: RA 4, RV 38/3, PA 43/18 (26), PCWP 11.  CO/CI 5.3/1.8.   CKD (chronic kidney disease), stage II    Diabetes mellitus without complication (HCC)    type 2   Dyspnea    with exertion   Gout    Hyperlipidemia LDL goal <70    Hypertension    Ischemic cardiomyopathy    a. 03/2015 Echo: EF 35-40%; b. 08/2017 Echo: EF 15%; c. 08/2017 cMRI: EF 35%; d. 06/2018 Echo: EF 10-15%; e. 07/2022 Echo: EF 15-20%, grIII DD.   IVCD (intraventricular conduction defect)    Left ventricular noncompaction [facility])    Morbid obesity (HCC)    NSVT (nonsustained ventricular tachycardia) (HCC)    a. 09/2022 Event monitor: predominantly sinus rhythm, avg HR 81 (46-167). 17 runs NSVT (fastest/longest 13 beats x 167). Rare PACs, 17.5% PVC burden.   PVC's (premature ventricular contractions)    a. 09/2022 Event Monitor: 17.5% PVC burden.   Statin  intolerance    Tobacco abuse     Current Outpatient Medications  Medication Sig Dispense Refill   Accu-Chek Softclix Lancets lancets Use up to four times daily as directed. 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 100 each 0   albuterol  (VENTOLIN  HFA) 108 (90 Base) MCG/ACT inhaler Inhale 1-2 puffs into the lungs every 6 (six) hours as needed for wheezing or shortness of breath.     allopurinol  (ZYLOPRIM ) 100 MG tablet Take 1 tablet (100 mg total) by mouth daily. 30 tablet 2   amiodarone  (PACERONE ) 200 MG tablet Take 1 tablet (200 mg total) by mouth daily. 30 tablet 5   Blood Glucose Monitoring Suppl (BLOOD GLUCOSE MONITOR SYSTEM) w/Device KIT Use up to four times daily as directed. 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 1 kit 0   colchicine  0.6 MG tablet Take 1 tablet (0.6 mg total) by mouth daily. 30 tablet 5   digoxin  (LANOXIN ) 0.125 MG tablet Take 1 tablet (0.125 mg total) by mouth daily. 30 tablet 1   doxycycline  (VIBRAMYCIN ) 50 MG capsule Take 2 capsules (100 mg total) by mouth 2 (two) times daily for 10 days. 40 capsule 0   empagliflozin  (JARDIANCE ) 10 MG TABS tablet Take 1 tablet (10 mg total) by mouth daily. 30 tablet 1   ezetimibe  (ZETIA ) 10 MG tablet Take 1 tablet (10 mg total) by mouth at bedtime. 90 tablet 0   gabapentin  (NEURONTIN ) 100 MG capsule Take 2 capsules (200 mg total) by mouth daily before breakfast. 60 capsule 5   gabapentin  (NEURONTIN ) 300 MG capsule Take 2 capsules (600 mg total) by mouth daily at 8 pm. 60 capsule 5   glucose blood (ACCU-CHEK GUIDE TEST) test strip Use up to four times daily as directed. 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 100 each 0   Lancet Device MISC 1 each by Does not apply route as directed. Dispense based on patient and insurance preference. Use up to four times daily as directed. 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 1 each 0   losartan  (COZAAR ) 25 MG tablet Take 1 tablet (25 mg total) by mouth daily. 30 tablet 5   metFORMIN  (GLUCOPHAGE ) 500 MG tablet Take 1 tablet (500 mg total) by mouth  daily with breakfast. 30 tablet 1   mexiletine (MEXITIL ) 150 MG capsule Take 2 capsules (300 mg total) by mouth 2 (two) times daily. 120 capsule 0   pantoprazole  (PROTONIX ) 40 MG tablet Take 1 tablet (40 mg total) by mouth daily. 30 tablet 5   spironolactone  (ALDACTONE ) 25 MG tablet Take 1 tablet (25 mg total) by mouth daily. 30 tablet 1   warfarin (COUMADIN ) 5 MG  tablet Take 5 mg (1 tablet) daily except take 7.5 mg (1.5 tablets) on Monday, Wednesday and Friday. 45 tablet 5   torsemide  (DEMADEX ) 20 MG tablet Take 1 tablet (20 mg total) by mouth every other day for 30 doses. 30 tablet 3   No current facility-administered medications for this encounter.    Allergies  Allergen Reactions   Lactose Intolerance (Gi) Diarrhea   Statins     myalgias      Social History   Socioeconomic History   Marital status: Single    Spouse name: Not on file   Number of children: 4   Years of education: Not on file   Highest education level: 10th grade  Occupational History   Not on file  Tobacco Use   Smoking status: Former    Current packs/day: 0.00    Types: Cigarettes    Quit date: 03/20/2015    Years since quitting: 8.8   Smokeless tobacco: Never   Tobacco comments:    Stopped smoking 11/24/2023  Vaping Use   Vaping status: Never Used  Substance and Sexual Activity   Alcohol use: Not Currently    Alcohol/week: 36.0 standard drinks of alcohol    Types: 36 Cans of beer per week   Drug use: No   Sexual activity: Yes    Birth control/protection: Coitus interruptus, Condom  Other Topics Concern   Not on file  Social History Narrative   Not on file   Social Drivers of Health   Financial Resource Strain: Medium Risk (12/13/2023)   Overall Financial Resource Strain (CARDIA)    Difficulty of Paying Living Expenses: Somewhat hard  Food Insecurity: No Food Insecurity (01/06/2024)   Hunger Vital Sign    Worried About Running Out of Food in the Last Year: Never true    Ran Out of Food in the  Last Year: Never true  Transportation Needs: Unmet Transportation Needs (01/07/2024)   PRAPARE - Administrator, Civil Service (Medical): Yes    Lack of Transportation (Non-Medical): Yes  Physical Activity: Not on file  Stress: Not on file  Social Connections: Not on file  Intimate Partner Violence: Not At Risk (01/07/2024)   Humiliation, Afraid, Rape, and Kick questionnaire    Fear of Current or Ex-Partner: No    Emotionally Abused: No    Physically Abused: No    Sexually Abused: No     No family history on file.  Vitals:   01/31/24 1235 01/31/24 1241  BP: 101/77 (!) 80/0  Pulse: 93   SpO2: 97%   Weight: (!) 148 kg (326 lb 3.2 oz)     Wt Readings from Last 3 Encounters:  01/31/24 (!) 148 kg (326 lb 3.2 oz)  01/24/24 (!) 144.7 kg (319 lb)  12/25/23 (!) 146.5 kg (323 lb)   Lab Results  Component Value Date   CREATININE 1.30 (H) 01/31/2024   CREATININE 1.25 (H) 01/24/2024   CREATININE 1.28 (H) 01/23/2024    PHYSICAL EXAM: General:  NAD.  HEENT: normal  Neck: supple. JVP not elevated.  Carotids 2+ bilat; no bruits. No lymphadenopathy or thryomegaly appreciated. Cor: LVAD hum.  Lungs: Clear. Abdomen: obese soft, nontender, non-distended. No hepatosplenomegaly. No bruits or masses. Good bowel sounds. Driveline site clean. Anchor in place.  Extremities: no cyanosis, clubbing, rash. Warm no edema  Neuro: alert & oriented x 3. No focal deficits. Moves all 4 without problem     ASSESSMENT & PLAN:  1. Chronic systolic heart failure: -  combined ischemic and nonischemic: -Mentions of compaction CM from [facility] cards notes. PVCs may also contribute, 25.3% PVCs with NSVT on Zio monitor in 5/25 - S/P HM3 LVAD Implant 01/11/24 - intra-op TEE with normal RV function (per anesthesia report).  - 11/25 Ramp Echo speed increased to 5700. Septum midline at this speed, aortic valve opens every other beat. Moderate RV dysfunction, IVC not dilated.  - NYHA I-II - Looks dry.  Drop torsemde to 20 every other day. (I suggested prn but he wanted to continue regular dosing) - Continue spironolactone  to 25 - Continue digoxin  0.125 mcg - Continue losartan  25 mg daily - Continue jardiance  10 mg daily  - Eventual transplant eval Body mass index is 40.77 kg/m. Need to get weight down   2. LVAD - VAD interrogated personally. Parameters stable. - DL with mild drainage. Start doxy 100  bid - LDH, hgb stable - Continue warfarin. INR 1.6 goal 2-2.5 Discussed warfarin dosing with PharmD personally.  3. CAD:   - LHC 5/24 at [facility]: severe multivessel CAD including 80% mid RCA, 100% RPDA, 100% mid LCx, 80% OM1, 80% mid-distal LAD.  - LHC 9/25: occluded PDA and mid AV LCx, 90% ramus, severe diffuse disease in right PLV, nonobstructive LAD disease. With markedly low EF and without severe LAD disease, CABG nor a durable option.  - No ASA given anticoagulation use - No s/s angina - Continue Zetia .    4. PVCs:  - Zio 5/25 with 25.3% (prev did not tolerate amiodarone  well, on mexiletine) - increased burden post-op - Continue amiodarone  200 mg daily - Continue mexilitine 150 mg every 8 hours.  - Wean amio as toelrated   5. Morbid obesity - Body mass index is 40.77 kg/m. - Consider GLP1-RA  I spent a total of 45 minutes today: 1) reviewing the patient's medical records including previous charts, labs and recent notes from other providers; 2) examining the patient and counseling them on their medical issues/explaining the plan of care; 3) adjusting meds as needed and 4) ordering lab work or other needed tests.     Toribio Fuel, MD 02/05/24

## 2024-02-07 ENCOUNTER — Encounter: Attending: Internal Medicine | Admitting: *Deleted

## 2024-02-07 ENCOUNTER — Other Ambulatory Visit (HOSPITAL_COMMUNITY): Payer: Self-pay | Admitting: Unknown Physician Specialty

## 2024-02-07 DIAGNOSIS — Z7901 Long term (current) use of anticoagulants: Secondary | ICD-10-CM

## 2024-02-07 DIAGNOSIS — Z95811 Presence of heart assist device: Secondary | ICD-10-CM

## 2024-02-07 DIAGNOSIS — I5042 Chronic combined systolic (congestive) and diastolic (congestive) heart failure: Secondary | ICD-10-CM

## 2024-02-07 NOTE — Progress Notes (Signed)
 Initial phone call completed. Diagnosis can be found in [facility] 11/19. EP Orientation scheduled for Wednesday 12/3 at 10:30am.

## 2024-02-12 ENCOUNTER — Ambulatory Visit (HOSPITAL_COMMUNITY)
Admission: RE | Admit: 2024-02-12 | Discharge: 2024-02-12 | Disposition: A | Source: Ambulatory Visit | Attending: Cardiology

## 2024-02-12 ENCOUNTER — Ambulatory Visit (HOSPITAL_COMMUNITY): Payer: Self-pay | Admitting: Pharmacist

## 2024-02-12 DIAGNOSIS — Z7901 Long term (current) use of anticoagulants: Secondary | ICD-10-CM | POA: Insufficient documentation

## 2024-02-12 DIAGNOSIS — Z4801 Encounter for change or removal of surgical wound dressing: Secondary | ICD-10-CM | POA: Insufficient documentation

## 2024-02-12 DIAGNOSIS — Z95811 Presence of heart assist device: Secondary | ICD-10-CM | POA: Diagnosis not present

## 2024-02-12 LAB — PROTIME-INR
INR: 2.2 — ABNORMAL HIGH (ref 0.8–1.2)
Prothrombin Time: 25.3 s — ABNORMAL HIGH (ref 11.4–15.2)

## 2024-02-12 MED ORDER — TRAMADOL HCL 50 MG PO TABS: 50.0000 mg | ORAL_TABLET | Freq: Three times a day (TID) | ORAL | 1 refills | Status: DC | PRN

## 2024-02-12 NOTE — Addendum Note (Signed)
 Encounter addended by: Elza Lauraine NOVAK, RN on: 02/12/2024 2:18 PM  Actions taken: Order list changed, Clinical Note Signed

## 2024-02-12 NOTE — Progress Notes (Addendum)
 Pt presents to clinic for dressing change and INR.  Pt was seen on 11/24 for dressing change and given course of Cefadroxil for possible driveline infection. Pt tells me that he is finishing his antibiotics today.  Pt states that he is still having pain in his incision at his sternum. He states he only has 2 tramadol  left and is asking for a refill. This was called into to the [facility] per Dr Cherrie.  Drive Line: Existing VAD dressing removed and site care performed using sterile technique. Drive line exit site cleaned with Chlora prep applicators x 2, allowed to dry, and gauze dressing with Silverlon patch applied. Exit site healing and  partially incorporated, the velour is fully implanted at exit site. Suture embedded in the skin. This was carefully removed. No redness, tenderness, foul odor or rash noted. Small amount of serous drainage. Drive line anchor correctly applied. Advance dressing changes to Monday/Thursday. Pt given 7 daily kits for home use.      Plan: Return to clinic for your appt with Dr Cherrie on 12/18. Coumadin  dosing per Bess JONETTA Lauraine Elza RN, BSN VAD Coordinator 24/7 Pager [phone]

## 2024-02-14 ENCOUNTER — Encounter: Admitting: *Deleted

## 2024-02-14 VITALS — Ht 74.0 in | Wt 329.8 lb

## 2024-02-14 DIAGNOSIS — Z95811 Presence of heart assist device: Secondary | ICD-10-CM | POA: Diagnosis present

## 2024-02-14 DIAGNOSIS — I5042 Chronic combined systolic (congestive) and diastolic (congestive) heart failure: Secondary | ICD-10-CM | POA: Insufficient documentation

## 2024-02-14 NOTE — Patient Instructions (Signed)
 Patient Instructions  Patient Details  Name: Philip Monroe MRN: [id number] Date of Birth: Feb 08, 1976 Referring Provider:  Cherrie Toribio SAUNDERS, MD  Below are your personal goals for exercise, nutrition, and risk factors. Our goal is to help you stay on track towards obtaining and maintaining these goals. We will be discussing your progress on these goals with you throughout the program.  Initial Exercise Prescription:  Initial Exercise Prescription - 02/14/24 1300       Date of Initial Exercise RX and Referring Provider   Date 02/14/24    Referring Provider Dr. Bensimhon      Oxygen   Maintain Oxygen Saturation 88% or higher      NuStep   Level 2    SPM 80    Minutes 15    METs 2.42      REL-XR   Level 1    Speed 50    Minutes 15    METs 2.42      Track   Laps 20    Minutes 15    METs 2.09      Prescription Details   Frequency (times per week) 2    Duration Progress to 30 minutes of continuous aerobic without signs/symptoms of physical distress      Intensity   THRR 40-80% of Max Heartrate 127-157    Ratings of Perceived Exertion 11-13    Perceived Dyspnea 0-4      Progression   Progression Continue to progress workloads to maintain intensity without signs/symptoms of physical distress.      Resistance Training   Training Prescription Yes    Weight 2    Reps 10-15          Exercise Goals: Frequency: Be able to perform aerobic exercise two to three times per week in program working toward 2-5 days per week of home exercise.  Intensity: Work with a perceived exertion of 11 (fairly light) - 15 (hard) while following your exercise prescription.  We will make changes to your prescription with you as you progress through the program.   Duration: Be able to do 30 to 45 minutes of continuous aerobic exercise in addition to a 5 minute warm-up and a 5 minute cool-down routine.   Nutrition Goals: Your personal nutrition goals will be established when you do your  nutrition analysis with the dietician.  The following are general nutrition guidelines to follow: Cholesterol < 200mg /day Sodium < 1500mg /day Fiber: Men under 50 yrs - 38 grams per day  Personal Goals:  Personal Goals and Risk Factors at Admission - 02/14/24 1316       Core Components/Risk Factors/Patient Goals on Admission    Weight Management Yes;Weight Loss    Intervention Weight Management: Develop a combined nutrition and exercise program designed to reach desired caloric intake, while maintaining appropriate intake of nutrient and fiber, sodium and fats, and appropriate energy expenditure required for the weight goal.;Weight Management: Provide education and appropriate resources to help participant work on and attain dietary goals.;Weight Management/Obesity: Establish reasonable short term and long term weight goals.;Obesity: Provide education and appropriate resources to help participant work on and attain dietary goals.    Admit Weight 329 lb 12.8 oz (149.6 kg)    Goal Weight: Short Term 320 lb (145.2 kg)    Goal Weight: Long Term 250 lb (113.4 kg)    Expected Outcomes Short Term: Continue to assess and modify interventions until short term weight is achieved;Long Term: Adherence to nutrition and physical activity/exercise program  aimed toward attainment of established weight goal;Weight Loss: Understanding of general recommendations for a balanced deficit meal plan, which promotes 1-2 lb weight loss per week and includes a negative energy balance of [phone] kcal/d;Understanding recommendations for meals to include 15-35% energy as protein, 25-35% energy from fat, 35-60% energy from carbohydrates, less than 200mg  of dietary cholesterol, 20-35 gm of total fiber daily;Understanding of distribution of calorie intake throughout the day with the consumption of 4-5 meals/snacks    Tobacco Cessation Yes    Number of packs per day 0   recent quit   Intervention Assist the participant in steps  to quit. Provide individualized education and counseling about committing to Tobacco Cessation, relapse prevention, and pharmacological support that can be provided by physician.;Education officer, environmental, assist with locating and accessing local/national Quit Smoking programs, and support quit date choice.    Expected Outcomes Short Term: Will demonstrate readiness to quit, by selecting a quit date.;Short Term: Will quit all tobacco product use, adhering to prevention of relapse plan.;Long Term: Complete abstinence from all tobacco products for at least 12 months from quit date.    Diabetes Yes    Intervention Provide education about signs/symptoms and action to take for hypo/hyperglycemia.;Provide education about proper nutrition, including hydration, and aerobic/resistive exercise prescription along with prescribed medications to achieve blood glucose in normal ranges: Fasting glucose 65-99 mg/dL    Expected Outcomes Short Term: Participant verbalizes understanding of the signs/symptoms and immediate care of hyper/hypoglycemia, proper foot care and importance of medication, aerobic/resistive exercise and nutrition plan for blood glucose control.;Long Term: Attainment of HbA1C < 7%.    Heart Failure Yes    Intervention Provide a combined exercise and nutrition program that is supplemented with education, support and counseling about heart failure. Directed toward relieving symptoms such as shortness of breath, decreased exercise tolerance, and extremity edema.    Expected Outcomes Improve functional capacity of life;Short term: Attendance in program 2-3 days a week with increased exercise capacity. Reported lower sodium intake. Reported increased fruit and vegetable intake. Reports medication compliance.;Short term: Daily weights obtained and reported for increase. Utilizing diuretic protocols set by physician.;Long term: Adoption of self-care skills and reduction of barriers for early signs and  symptoms recognition and intervention leading to self-care maintenance.    Hypertension Yes    Intervention Provide education on lifestyle modifcations including regular physical activity/exercise, weight management, moderate sodium restriction and increased consumption of fresh fruit, vegetables, and low fat dairy, alcohol moderation, and smoking cessation.;Monitor prescription use compliance.    Expected Outcomes Short Term: Continued assessment and intervention until BP is < 140/45mm HG in hypertensive participants. < 130/55mm HG in hypertensive participants with diabetes, heart failure or chronic kidney disease.;Long Term: Maintenance of blood pressure at goal levels.    Lipids Yes    Intervention Provide education and support for participant on nutrition & aerobic/resistive exercise along with prescribed medications to achieve LDL 70mg , HDL >40mg .    Expected Outcomes Short Term: Participant states understanding of desired cholesterol values and is compliant with medications prescribed. Participant is following exercise prescription and nutrition guidelines.;Long Term: Cholesterol controlled with medications as prescribed, with individualized exercise RX and with personalized nutrition plan. Value goals: LDL < 70mg , HDL > 40 mg.          Tobacco Use Initial Evaluation: Social History   Tobacco Use  Smoking Status Former   Current packs/day: 0.00   Types: Cigarettes   Quit date: 03/20/2015   Years since quitting: 8.9  Smokeless  Tobacco Never  Tobacco Comments   Stopped smoking 11/24/2023    Exercise Goals and Review:  Exercise Goals     Row Name 02/14/24 1312             Exercise Goals   Increase Physical Activity Yes       Intervention Provide advice, education, support and counseling about physical activity/exercise needs.;Develop an individualized exercise prescription for aerobic and resistive training based on initial evaluation findings, risk stratification, comorbidities  and participant's personal goals.       Expected Outcomes Short Term: Attend rehab on a regular basis to increase amount of physical activity.;Long Term: Add in home exercise to make exercise part of routine and to increase amount of physical activity.;Long Term: Exercising regularly at least 3-5 days a week.       Increase Strength and Stamina Yes       Intervention Provide advice, education, support and counseling about physical activity/exercise needs.;Develop an individualized exercise prescription for aerobic and resistive training based on initial evaluation findings, risk stratification, comorbidities and participant's personal goals.       Expected Outcomes Short Term: Increase workloads from initial exercise prescription for resistance, speed, and METs.;Short Term: Perform resistance training exercises routinely during rehab and add in resistance training at home;Long Term: Improve cardiorespiratory fitness, muscular endurance and strength as measured by increased METs and functional capacity ( )       Able to understand and use rate of perceived exertion (RPE) scale Yes       Intervention Provide education and explanation on how to use RPE scale       Expected Outcomes Short Term: Able to use RPE daily in rehab to express subjective intensity level;Long Term:  Able to use RPE to guide intensity level when exercising independently       Able to understand and use Dyspnea scale Yes       Intervention Provide education and explanation on how to use Dyspnea scale       Expected Outcomes Short Term: Able to use Dyspnea scale daily in rehab to express subjective sense of shortness of breath during exertion;Long Term: Able to use Dyspnea scale to guide intensity level when exercising independently       Knowledge and understanding of Target Heart Rate Range (THRR) Yes       Intervention Provide education and explanation of THRR including how the numbers were predicted and where they are located for  reference       Expected Outcomes Short Term: Able to state/look up THRR;Long Term: Able to use THRR to govern intensity when exercising independently;Short Term: Able to use daily as guideline for intensity in rehab       Able to check pulse independently Yes       Intervention Provide education and demonstration on how to check pulse in carotid and radial arteries.;Review the importance of being able to check your own pulse for safety during independent exercise       Expected Outcomes Short Term: Able to explain why pulse checking is important during independent exercise;Long Term: Able to check pulse independently and accurately       Understanding of Exercise Prescription Yes       Intervention Provide education, explanation, and written materials on patient's individual exercise prescription       Expected Outcomes Short Term: Able to explain program exercise prescription;Long Term: Able to explain home exercise prescription to exercise independently  Copy of goals given to participant.

## 2024-02-14 NOTE — Progress Notes (Signed)
 Cardiac Individual Treatment Plan  Patient Details  Name: Philip Monroe MRN: [id number] Date of Birth: Sep 18, 1975 Referring Provider:   Flowsheet Row Cardiac Rehab from 02/14/2024 in [facility] Cardiac and Pulmonary Rehab  Referring Provider Dr. Cherrie    Initial Encounter Date:  Flowsheet Row Cardiac Rehab from 02/14/2024 in [facility] Cardiac and Pulmonary Rehab  Date 02/14/24    Visit Diagnosis: LVAD (left ventricular assist device) present (HCC)  Chronic combined systolic and diastolic congestive heart failure (HCC)  Patient's Home Medications on Admission:  Current Outpatient Medications:    Accu-Chek Softclix Lancets lancets, Use up to four times daily as directed. (FOR ICD-10 E10.9, E11.9)., Disp: 100 each, Rfl: 0   albuterol  (VENTOLIN  HFA) 108 (90 Base) MCG/ACT inhaler, Inhale 1-2 puffs into the lungs every 6 (six) hours as needed for wheezing or shortness of breath., Disp: , Rfl:    allopurinol  (ZYLOPRIM ) 100 MG tablet, Take 1 tablet (100 mg total) by mouth daily., Disp: 30 tablet, Rfl: 2   amiodarone  (PACERONE ) 200 MG tablet, Take 1 tablet (200 mg total) by mouth daily., Disp: 30 tablet, Rfl: 5   Blood Glucose Monitoring Suppl (BLOOD GLUCOSE MONITOR SYSTEM) w/Device KIT, Use up to four times daily as directed. (FOR ICD-10 E10.9, E11.9)., Disp: 1 kit, Rfl: 0   colchicine  0.6 MG tablet, Take 1 tablet (0.6 mg total) by mouth daily., Disp: 30 tablet, Rfl: 5   digoxin  (LANOXIN ) 0.125 MG tablet, Take 1 tablet (0.125 mg total) by mouth daily., Disp: 30 tablet, Rfl: 1   empagliflozin  (JARDIANCE ) 10 MG TABS tablet, Take 1 tablet (10 mg total) by mouth daily., Disp: 30 tablet, Rfl: 1   ezetimibe  (ZETIA ) 10 MG tablet, Take 1 tablet (10 mg total) by mouth at bedtime., Disp: 90 tablet, Rfl: 0   gabapentin  (NEURONTIN ) 100 MG capsule, Take 2 capsules (200 mg total) by mouth daily before breakfast., Disp: 60 capsule, Rfl: 5   gabapentin  (NEURONTIN ) 300 MG capsule, Take 2 capsules (600 mg total) by  mouth daily at 8 pm., Disp: 60 capsule, Rfl: 5   glucose blood (ACCU-CHEK GUIDE TEST) test strip, Use up to four times daily as directed. (FOR ICD-10 E10.9, E11.9)., Disp: 100 each, Rfl: 0   Lancet Device MISC, 1 each by Does not apply route as directed. Dispense based on patient and insurance preference. Use up to four times daily as directed. (FOR ICD-10 E10.9, E11.9)., Disp: 1 each, Rfl: 0   losartan  (COZAAR ) 25 MG tablet, Take 1 tablet (25 mg total) by mouth daily., Disp: 30 tablet, Rfl: 5   metFORMIN  (GLUCOPHAGE ) 500 MG tablet, Take 1 tablet (500 mg total) by mouth daily with breakfast., Disp: 30 tablet, Rfl: 1   mexiletine (MEXITIL ) 150 MG capsule, Take 2 capsules (300 mg total) by mouth 2 (two) times daily., Disp: 120 capsule, Rfl: 0   pantoprazole  (PROTONIX ) 40 MG tablet, Take 1 tablet (40 mg total) by mouth daily., Disp: 30 tablet, Rfl: 5   spironolactone  (ALDACTONE ) 25 MG tablet, Take 1 tablet (25 mg total) by mouth daily., Disp: 30 tablet, Rfl: 1   torsemide  (DEMADEX ) 20 MG tablet, Take 1 tablet (20 mg total) by mouth every other day for 30 doses., Disp: 30 tablet, Rfl: 3   traMADol  (ULTRAM ) 50 MG tablet, Take 1-2 tablets (50-100 mg total) by mouth every 8 (eight) hours as needed., Disp: 30 tablet, Rfl: 1   warfarin (COUMADIN ) 5 MG tablet, Take 5 mg (1 tablet) daily except take 7.5 mg (1.5 tablets) on Monday,  Wednesday and Friday., Disp: 45 tablet, Rfl: 5  Past Medical History: Past Medical History:  Diagnosis Date   Aortic root dilatation    a. 07/2022 Echo: Ao root 4.4cm, Asc Ao 4.0cm.   Asthma    as a child, no problems as an adult, rarely uses inhaler   CAD (coronary artery disease)    a. 08/2017 Cath: LM nl, LAD 53m/d, D1 70ost, LCX 163m, OM1 90 inf branch, 60 sup branch, OM2 small, RCA 30 diffuse, RPDA 100 w/ L->R collats; b. 07/2022 Echo: LM nl, LAD 45m, D1 80, LCX 134m, OM1 80, RCA 13m, RPDA 100-->No targets for PCI/CABG-->Med rx.   CHF (congestive heart failure) (HCC)    takes  lasix    Chronic HFrEF (heart failure with reduced ejection fraction) (HCC)    a. 03/2015 Echo: EF 35-40%; b. 08/2017 Echo: EF 15%; c. 08/2017 cMRI: EF 35%; d. 06/2018 Echo: EF 10-15%; e. 07/2022 Echo: EF 15-20%, grIII DD, mild MR, sev dil LA, mod-sev PH, Ao root 4.4cm, Asc Ao 4.0cm; f. 07/2022 RHC: RA 4, RV 38/3, PA 43/18 (26), PCWP 11. CO/CI 5.3/1.8.   CKD (chronic kidney disease), stage II    Diabetes mellitus without complication (HCC)    type 2   Dyspnea    with exertion   Gout    Hyperlipidemia LDL goal <70    Hypertension    Ischemic cardiomyopathy    a. 03/2015 Echo: EF 35-40%; b. 08/2017 Echo: EF 15%; c. 08/2017 cMRI: EF 35%; d. 06/2018 Echo: EF 10-15%; e. 07/2022 Echo: EF 15-20%, grIII DD.   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 09/2022 Event monitor: predominantly sinus rhythm, avg HR 81 (46-167). 17 runs NSVT (fastest/longest 13 beats x 167). Rare PACs, 17.5% PVC burden.   PVC's (premature ventricular contractions)    a. 09/2022 Event Monitor: 17.5% PVC burden.   Statin intolerance    Tobacco abuse     Tobacco Use: Social History   Tobacco Use  Smoking Status Former   Current packs/day: 0.00   Types: Cigarettes   Quit date: 03/20/2015   Years since quitting: 8.9  Smokeless Tobacco Never  Tobacco Comments   Stopped smoking 11/24/2023    Labs: Review Flowsheet  More data exists      Latest Ref Rng & Units 01/20/2024 01/21/2024 01/22/2024 01/23/2024 01/24/2024  Labs for ITP Cardiac and Pulmonary Rehab  O2 Saturation % 62.9  63.3  67.4  58.7  67.2      Exercise Target Goals: Exercise Program Goal: Individual exercise prescription set using results from initial 6 min walk test and THRR while considering  patient's activity barriers and safety.   Exercise Prescription Goal: Initial exercise prescription builds to 30-45 minutes a day of aerobic  activity, 2-3 days per week.  Home exercise guidelines will be given to patient during program as part of exercise prescription that the participant will acknowledge.   Education: Aerobic Exercise: - Group verbal and visual presentation on the components of exercise prescription. Introduces F.I.T.T principle from ACSM for exercise prescriptions.  Reviews F.I.T.T. principles of aerobic exercise including progression. Written material provided at class time. Flowsheet Row Cardiac Rehab from 02/14/2024 in [facility] Cardiac and Pulmonary Rehab  Education need identified 02/14/24    Education: Resistance Exercise: - Group verbal and visual presentation on the components of exercise prescription. Introduces F.I.T.T principle from ACSM for exercise  prescriptions  Reviews F.I.T.T. principles of resistance exercise including progression. Written material provided at class time.    Education: Exercise & Equipment Safety: - Individual verbal instruction and demonstration of equipment use and safety with use of the equipment. Flowsheet Row Cardiac Rehab from 02/14/2024 in [facility] Cardiac and Pulmonary Rehab  Date 02/14/24  Educator [facility]  Instruction Review Code 1- Verbalizes Understanding    Education: Exercise Physiology & General Exercise Guidelines: - Group verbal and written instruction with models to review the exercise physiology of the cardiovascular system and associated critical values. Provides general exercise guidelines with specific guidelines to those with heart or lung disease. Written material provided at class time. Flowsheet Row Cardiac Rehab from 02/14/2024 in [facility] Cardiac and Pulmonary Rehab  Education need identified 02/14/24    Education: Flexibility, Balance, Mind/Body Relaxation: - Group verbal and visual presentation with interactive activity on the components of exercise prescription. Introduces F.I.T.T principle from ACSM for exercise prescriptions. Reviews F.I.T.T. principles of  flexibility and balance exercise training including progression. Also discusses the mind body connection.  Reviews various relaxation techniques to help reduce and manage stress (i.e. Deep breathing, progressive muscle relaxation, and visualization). Balance handout provided to take home. Written material provided at class time.   Activity Barriers & Risk Stratification:  Activity Barriers & Cardiac Risk Stratification - 02/14/24 1305       Activity Barriers & Cardiac Risk Stratification   Activity Barriers Decreased Ventricular Function;Muscular Weakness    Cardiac Risk Stratification High          6 Minute Walk:  6 Minute Walk     Row Name 02/14/24 1304         6 Minute Walk   Phase Initial     Distance 950 feet     Walk Time 6 minutes     # of Rest Breaks 0     MPH 1.8     METS 2.42     RPE 13     Perceived Dyspnea  0     VO2 Peak 8.49     Symptoms No     Resting HR 97 bpm     Resting BP --  Doppler 82     Resting Oxygen Saturation  95 %     Exercise Oxygen Saturation  during 6 min walk 96 %     Max Ex. HR 117 bpm     Max Ex. BP --  Doppler 92     2 Minute Post BP --  Doppler 88        Oxygen Initial Assessment:   Oxygen Re-Evaluation:   Oxygen Discharge (Final Oxygen Re-Evaluation):   Initial Exercise Prescription:  Initial Exercise Prescription - 02/14/24 1300       Date of Initial Exercise RX and Referring Provider   Date 02/14/24    Referring Provider Dr. Bensimhon      Oxygen   Maintain Oxygen Saturation 88% or higher      NuStep   Level 2    SPM 80    Minutes 15    METs 2.42      REL-XR   Level 1    Speed 50    Minutes 15    METs 2.42      Track   Laps 20    Minutes 15    METs 2.09      Prescription Details   Frequency (times per week) 2    Duration Progress to 30 minutes of continuous aerobic  without signs/symptoms of physical distress      Intensity   THRR 40-80% of Max Heartrate 127-157    Ratings of Perceived Exertion  11-13    Perceived Dyspnea 0-4      Progression   Progression Continue to progress workloads to maintain intensity without signs/symptoms of physical distress.      Resistance Training   Training Prescription Yes    Weight 2    Reps 10-15          Perform Capillary Blood Glucose checks as needed.  Exercise Prescription Changes:   Exercise Prescription Changes     Row Name 02/14/24 1300             Response to Exercise   Blood Pressure (Admit) --  Doppler 82       Blood Pressure (Exercise) --  Doppler 92       Blood Pressure (Exit) --  Doppler 88       Heart Rate (Admit) 97 bpm       Heart Rate (Exercise) 117 bpm       Heart Rate (Exit) 96 bpm       Oxygen Saturation (Admit) 95 %       Oxygen Saturation (Exercise) 96 %       Oxygen Saturation (Exit) 96 %       Rating of Perceived Exertion (Exercise) 13       Perceived Dyspnea (Exercise) 0       Symptoms none       Comments 6 MWT results          Exercise Comments:   Exercise Goals and Review:   Exercise Goals     Row Name 02/14/24 1312             Exercise Goals   Increase Physical Activity Yes       Intervention Provide advice, education, support and counseling about physical activity/exercise needs.;Develop an individualized exercise prescription for aerobic and resistive training based on initial evaluation findings, risk stratification, comorbidities and participant's personal goals.       Expected Outcomes Short Term: Attend rehab on a regular basis to increase amount of physical activity.;Long Term: Add in home exercise to make exercise part of routine and to increase amount of physical activity.;Long Term: Exercising regularly at least 3-5 days a week.       Increase Strength and Stamina Yes       Intervention Provide advice, education, support and counseling about physical activity/exercise needs.;Develop an individualized exercise prescription for aerobic and resistive training based on initial  evaluation findings, risk stratification, comorbidities and participant's personal goals.       Expected Outcomes Short Term: Increase workloads from initial exercise prescription for resistance, speed, and METs.;Short Term: Perform resistance training exercises routinely during rehab and add in resistance training at home;Long Term: Improve cardiorespiratory fitness, muscular endurance and strength as measured by increased METs and functional capacity ( )       Able to understand and use rate of perceived exertion (RPE) scale Yes       Intervention Provide education and explanation on how to use RPE scale       Expected Outcomes Short Term: Able to use RPE daily in rehab to express subjective intensity level;Long Term:  Able to use RPE to guide intensity level when exercising independently       Able to understand and use Dyspnea scale Yes       Intervention Provide education and explanation  on how to use Dyspnea scale       Expected Outcomes Short Term: Able to use Dyspnea scale daily in rehab to express subjective sense of shortness of breath during exertion;Long Term: Able to use Dyspnea scale to guide intensity level when exercising independently       Knowledge and understanding of Target Heart Rate Range (THRR) Yes       Intervention Provide education and explanation of THRR including how the numbers were predicted and where they are located for reference       Expected Outcomes Short Term: Able to state/look up THRR;Long Term: Able to use THRR to govern intensity when exercising independently;Short Term: Able to use daily as guideline for intensity in rehab       Able to check pulse independently Yes       Intervention Provide education and demonstration on how to check pulse in carotid and radial arteries.;Review the importance of being able to check your own pulse for safety during independent exercise       Expected Outcomes Short Term: Able to explain why pulse checking is important  during independent exercise;Long Term: Able to check pulse independently and accurately       Understanding of Exercise Prescription Yes       Intervention Provide education, explanation, and written materials on patient's individual exercise prescription       Expected Outcomes Short Term: Able to explain program exercise prescription;Long Term: Able to explain home exercise prescription to exercise independently          Exercise Goals Re-Evaluation :   Discharge Exercise Prescription (Final Exercise Prescription Changes):  Exercise Prescription Changes - 02/14/24 1300       Response to Exercise   Blood Pressure (Admit) --   Doppler 82   Blood Pressure (Exercise) --   Doppler 92   Blood Pressure (Exit) --   Doppler 88   Heart Rate (Admit) 97 bpm    Heart Rate (Exercise) 117 bpm    Heart Rate (Exit) 96 bpm    Oxygen Saturation (Admit) 95 %    Oxygen Saturation (Exercise) 96 %    Oxygen Saturation (Exit) 96 %    Rating of Perceived Exertion (Exercise) 13    Perceived Dyspnea (Exercise) 0    Symptoms none    Comments 6 MWT results          Nutrition:  Target Goals: Understanding of nutrition guidelines, daily intake of sodium 1500mg , cholesterol 200mg , calories 30% from fat and 7% or less from saturated fats, daily to have 5 or more servings of fruits and vegetables.  Education: Nutrition 1 -Group instruction provided by verbal, written material, interactive activities, discussions, models, and posters to present general guidelines for heart healthy nutrition including macronutrients, label reading, and promoting whole foods over processed counterparts. Education serves as pensions consultant of discussion of heart healthy eating for all. Written material provided at class time.    Education: Nutrition 2 -Group instruction provided by verbal, written material, interactive activities, discussions, models, and posters to present general guidelines for heart healthy nutrition including  sodium, cholesterol, and saturated fat. Providing guidance of habit forming to improve blood pressure, cholesterol, and body weight. Written material provided at class time.     Biometrics:  Pre Biometrics - 02/14/24 1313       Pre Biometrics   Height 6' 2 (1.88 m)    Weight 329 lb 12.8 oz (149.6 kg)    Waist Circumference 52 inches  Hip Circumference 53 inches    Waist to Hip Ratio 0.98 %    BMI (Calculated) 42.33    Single Leg Stand 4.57 seconds           Nutrition Therapy Plan and Nutrition Goals:  Nutrition Therapy & Goals - 02/14/24 1318       Intervention Plan   Intervention Prescribe, educate and counsel regarding individualized specific dietary modifications aiming towards targeted core components such as weight, hypertension, lipid management, diabetes, heart failure and other comorbidities.    Expected Outcomes Short Term Goal: Understand basic principles of dietary content, such as calories, fat, sodium, cholesterol and nutrients.;Short Term Goal: A plan has been developed with personal nutrition goals set during dietitian appointment.;Long Term Goal: Adherence to prescribed nutrition plan.          Nutrition Assessments:  MEDIFICTS Score Key: >=70 Need to make dietary changes  40-70 Heart Healthy Diet <= 40 Therapeutic Level Cholesterol Diet  Flowsheet Row Cardiac Rehab from 02/14/2024 in [facility] Cardiac and Pulmonary Rehab  Picture Your Plate Total Score on Admission 63   Picture Your Plate Scores: <59 Unhealthy dietary pattern with much room for improvement. 41-50 Dietary pattern unlikely to meet recommendations for good health and room for improvement. 51-60 More healthful dietary pattern, with some room for improvement.  >60 Healthy dietary pattern, although there may be some specific behaviors that could be improved.    Nutrition Goals Re-Evaluation:   Nutrition Goals Discharge (Final Nutrition Goals Re-Evaluation):   Psychosocial: Target  Goals: Acknowledge presence or absence of significant depression and/or stress, maximize coping skills, provide positive support system. Participant is able to verbalize types and ability to use techniques and skills needed for reducing stress and depression.   Education: Stress, Anxiety, and Depression - Group verbal and visual presentation to define topics covered.  Reviews how body is impacted by stress, anxiety, and depression.  Also discusses healthy ways to reduce stress and to treat/manage anxiety and depression. Written material provided at class time.   Education: Sleep Hygiene -Provides group verbal and written instruction about how sleep can affect your health.  Define sleep hygiene, discuss sleep cycles and impact of sleep habits. Review good sleep hygiene tips.   Initial Review & Psychosocial Screening:  Initial Psych Review & Screening - 02/07/24 1013       Initial Review   Current issues with Current Stress Concerns;Current Sleep Concerns    Source of Stress Concerns Chronic Illness;Unable to perform yard/household activities;Unable to participate in former interests or hobbies      Family Dynamics   Good Support System? Yes   family     Barriers   Psychosocial barriers to participate in program There are no identifiable barriers or psychosocial needs.;The patient should benefit from training in stress management and relaxation.      Screening Interventions   Interventions Encouraged to exercise;Provide feedback about the scores to participant;To provide support and resources with identified psychosocial needs    Expected Outcomes Long Term Goal: Stressors or current issues are controlled or eliminated.;Short Term goal: Utilizing psychosocial counselor, staff and physician to assist with identification of specific Stressors or current issues interfering with healing process. Setting desired goal for each stressor or current issue identified.;Short Term goal: Identification  and review with participant of any Quality of Life or Depression concerns found by scoring the questionnaire.;Long Term goal: The participant improves quality of Life and PHQ9 Scores as seen by post scores and/or verbalization of changes  Quality of Life Scores:   Quality of Life - 02/14/24 1318       Quality of Life   Select Quality of Life      Quality of Life Scores   Health/Function Pre 17.68 %    Socioeconomic Pre 16.93 %    Psych/Spiritual Pre 21.43 %    Family Pre 23 %    GLOBAL Pre 19 %         Scores of 19 and below usually indicate a poorer quality of life in these areas.  A difference of  2-3 points is a clinically meaningful difference.  A difference of 2-3 points in the total score of the Quality of Life Index has been associated with significant improvement in overall quality of life, self-image, physical symptoms, and general health in studies assessing change in quality of life.  PHQ-9: Review Flowsheet       02/14/2024 06/11/2015 05/18/2015 04/20/2015  Depression screen PHQ 2/9  Decreased Interest 0 0 0 0  Down, Depressed, Hopeless 0 0 0 0  PHQ - 2 Score 0 0 0 0  Altered sleeping 1 - - -  Tired, decreased energy 0 - - -  Change in appetite 1 - - -  Feeling bad or failure about yourself  0 - - -  Trouble concentrating 0 - - -  Moving slowly or fidgety/restless 0 - - -  Suicidal thoughts 0 - - -  PHQ-9 Score 2 - - -  Difficult doing work/chores Not difficult at all - - -   Interpretation of Total Score  Total Score Depression Severity:  1-4 = Minimal depression, 5-9 = Mild depression, 10-14 = Moderate depression, 15-19 = Moderately severe depression, 20-27 = Severe depression   Psychosocial Evaluation and Intervention:  Psychosocial Evaluation - 02/07/24 1020       Psychosocial Evaluation & Interventions   Comments Mr. Cudmore is coming to cardiac rehab post LVAD. He states there has been some stress revolving around getting used to his LVAD and  figuring out what his new normal is. His sleep has also been altered because he has a hard time getting comfortable, wakes up sweaty and in pain sometimes. He mentions he has a good support system. He notes he is aware this will all take time to get used to and he is ready to get involved in the program to learn more about heart healthy living.    Expected Outcomes Short: attend cardiac rehab for education and exercise Long: develop and maintain positive self care habits    Continue Psychosocial Services  Follow up required by staff          Psychosocial Re-Evaluation:   Psychosocial Discharge (Final Psychosocial Re-Evaluation):   Vocational Rehabilitation: Provide vocational rehab assistance to qualifying candidates.   Vocational Rehab Evaluation & Intervention:  Vocational Rehab - 02/07/24 1013       Initial Vocational Rehab Evaluation & Intervention   Assessment shows need for Vocational Rehabilitation No          Education: Education Goals: Education classes will be provided on a variety of topics geared toward better understanding of heart health and risk factor modification. Participant will state understanding/return demonstration of topics presented as noted by education test scores.  Learning Barriers/Preferences:  Learning Barriers/Preferences - 02/07/24 1013       Learning Barriers/Preferences   Learning Barriers None    Learning Preferences None          General Cardiac Education Topics:  AED/CPR: - Group verbal and written instruction with the use of models to demonstrate the basic use of the AED with the basic ABC's of resuscitation.   Test and Procedures: - Group verbal and visual presentation and models provide information about basic cardiac anatomy and function. Reviews the testing methods done to diagnose heart disease and the outcomes of the test results. Describes the treatment choices: Medical Management, Angioplasty, or Coronary Bypass Surgery  for treating various heart conditions including Myocardial Infarction, Angina, Valve Disease, and Cardiac Arrhythmias. Written material provided at class time. Flowsheet Row Cardiac Rehab from 02/14/2024 in [facility] Cardiac and Pulmonary Rehab  Education need identified 02/14/24    Medication Safety: - Group verbal and visual instruction to review commonly prescribed medications for heart and lung disease. Reviews the medication, class of the drug, and side effects. Includes the steps to properly store meds and maintain the prescription regimen. Written material provided at class time.   Intimacy: - Group verbal instruction through game format to discuss how heart and lung disease can affect sexual intimacy. Written material provided at class time.   Know Your Numbers and Heart Failure: - Group verbal and visual instruction to discuss disease risk factors for cardiac and pulmonary disease and treatment options.  Reviews associated critical values for Overweight/Obesity, Hypertension, Cholesterol, and Diabetes.  Discusses basics of heart failure: signs/symptoms and treatments.  Introduces Heart Failure Zone chart for action plan for heart failure. Written material provided at class time.   Infection Prevention: - Provides verbal and written material to individual with discussion of infection control including proper hand washing and proper equipment cleaning during exercise session. Flowsheet Row Cardiac Rehab from 02/14/2024 in [facility] Cardiac and Pulmonary Rehab  Date 02/14/24  Educator [facility]  Instruction Review Code 1- Verbalizes Understanding    Falls Prevention: - Provides verbal and written material to individual with discussion of falls prevention and safety. Flowsheet Row Cardiac Rehab from 02/14/2024 in [facility] Cardiac and Pulmonary Rehab  Date 02/14/24  Educator [facility]  Instruction Review Code 1- Verbalizes Understanding    Other: -Provides group and verbal instruction on various topics (see  comments)   Knowledge Questionnaire Score:  Knowledge Questionnaire Score - 02/14/24 1315       Knowledge Questionnaire Score   Pre Score 23/26          Core Components/Risk Factors/Patient Goals at Admission:  Personal Goals and Risk Factors at Admission - 02/14/24 1316       Core Components/Risk Factors/Patient Goals on Admission    Weight Management Yes;Weight Loss    Intervention Weight Management: Develop a combined nutrition and exercise program designed to reach desired caloric intake, while maintaining appropriate intake of nutrient and fiber, sodium and fats, and appropriate energy expenditure required for the weight goal.;Weight Management: Provide education and appropriate resources to help participant work on and attain dietary goals.;Weight Management/Obesity: Establish reasonable short term and long term weight goals.;Obesity: Provide education and appropriate resources to help participant work on and attain dietary goals.    Admit Weight 329 lb 12.8 oz (149.6 kg)    Goal Weight: Short Term 320 lb (145.2 kg)    Goal Weight: Long Term 250 lb (113.4 kg)    Expected Outcomes Short Term: Continue to assess and modify interventions until short term weight is achieved;Long Term: Adherence to nutrition and physical activity/exercise program aimed toward attainment of established weight goal;Weight Loss: Understanding of general recommendations for a balanced deficit meal plan, which promotes 1-2 lb weight loss per  week and includes a negative energy balance of [phone] kcal/d;Understanding recommendations for meals to include 15-35% energy as protein, 25-35% energy from fat, 35-60% energy from carbohydrates, less than 200mg  of dietary cholesterol, 20-35 gm of total fiber daily;Understanding of distribution of calorie intake throughout the day with the consumption of 4-5 meals/snacks    Tobacco Cessation Yes    Number of packs per day 0   recent quit   Intervention Assist the  participant in steps to quit. Provide individualized education and counseling about committing to Tobacco Cessation, relapse prevention, and pharmacological support that can be provided by physician.;Education officer, environmental, assist with locating and accessing local/national Quit Smoking programs, and support quit date choice.    Expected Outcomes Short Term: Will demonstrate readiness to quit, by selecting a quit date.;Short Term: Will quit all tobacco product use, adhering to prevention of relapse plan.;Long Term: Complete abstinence from all tobacco products for at least 12 months from quit date.    Diabetes Yes    Intervention Provide education about signs/symptoms and action to take for hypo/hyperglycemia.;Provide education about proper nutrition, including hydration, and aerobic/resistive exercise prescription along with prescribed medications to achieve blood glucose in normal ranges: Fasting glucose 65-99 mg/dL    Expected Outcomes Short Term: Participant verbalizes understanding of the signs/symptoms and immediate care of hyper/hypoglycemia, proper foot care and importance of medication, aerobic/resistive exercise and nutrition plan for blood glucose control.;Long Term: Attainment of HbA1C < 7%.    Heart Failure Yes    Intervention Provide a combined exercise and nutrition program that is supplemented with education, support and counseling about heart failure. Directed toward relieving symptoms such as shortness of breath, decreased exercise tolerance, and extremity edema.    Expected Outcomes Improve functional capacity of life;Short term: Attendance in program 2-3 days a week with increased exercise capacity. Reported lower sodium intake. Reported increased fruit and vegetable intake. Reports medication compliance.;Short term: Daily weights obtained and reported for increase. Utilizing diuretic protocols set by physician.;Long term: Adoption of self-care skills and reduction of barriers for  early signs and symptoms recognition and intervention leading to self-care maintenance.    Hypertension Yes    Intervention Provide education on lifestyle modifcations including regular physical activity/exercise, weight management, moderate sodium restriction and increased consumption of fresh fruit, vegetables, and low fat dairy, alcohol moderation, and smoking cessation.;Monitor prescription use compliance.    Expected Outcomes Short Term: Continued assessment and intervention until BP is < 140/14mm HG in hypertensive participants. < 130/67mm HG in hypertensive participants with diabetes, heart failure or chronic kidney disease.;Long Term: Maintenance of blood pressure at goal levels.    Lipids Yes    Intervention Provide education and support for participant on nutrition & aerobic/resistive exercise along with prescribed medications to achieve LDL 70mg , HDL >40mg .    Expected Outcomes Short Term: Participant states understanding of desired cholesterol values and is compliant with medications prescribed. Participant is following exercise prescription and nutrition guidelines.;Long Term: Cholesterol controlled with medications as prescribed, with individualized exercise RX and with personalized nutrition plan. Value goals: LDL < 70mg , HDL > 40 mg.          Education:Diabetes - Individual verbal and written instruction to review signs/symptoms of diabetes, desired ranges of glucose level fasting, after meals and with exercise. Acknowledge that pre and post exercise glucose checks will be done for 3 sessions at entry of program. Flowsheet Row Cardiac Rehab from 02/14/2024 in [facility] Cardiac and Pulmonary Rehab  Date 02/14/24  Educator [facility]  Instruction  Review Code 1- Verbalizes Understanding    Core Components/Risk Factors/Patient Goals Review:   Goals and Risk Factor Review     Row Name 02/14/24 1303             Core Components/Risk Factors/Patient Goals Review   Personal Goals Review  Tobacco Cessation       Review [place]a has recently quit tobacco use within the last 6 months. Intervention for relapse prevention was provided at the initial medical review. He was encouraged to continue to with tobacco cessation and was provided information on relapse prevention. Patient received information about combination therapy, tobacco cessation classes, quit line, and quit smoking apps in case of a relapse. Patient demonstrated understanding of this material.Staff will continue to provide encouragement and follow up with the patient throughout the program.          Core Components/Risk Factors/Patient Goals at Discharge (Final Review):   Goals and Risk Factor Review - 02/14/24 1303       Core Components/Risk Factors/Patient Goals Review   Personal Goals Review Tobacco Cessation    Review Sanuel has recently quit tobacco use within the last 6 months. Intervention for relapse prevention was provided at the initial medical review. He was encouraged to continue to with tobacco cessation and was provided information on relapse prevention. Patient received information about combination therapy, tobacco cessation classes, quit line, and quit smoking apps in case of a relapse. Patient demonstrated understanding of this material.Staff will continue to provide encouragement and follow up with the patient throughout the program.          ITP Comments:  ITP Comments     Row Name 02/07/24 1024 02/14/24 1302         ITP Comments Initial phone call completed. Diagnosis can be found in [facility] 11/19. EP Orientation scheduled for Wednesday 12/3 at 10:30am. Completed and gym orientation for cardiac rehab. Initial ITP created and sent for review to Dr. Oneil Pinal, Medical Director. Richard has recently quit tobacco use within the last 6 months. Intervention for relapse prevention was provided at the initial medical review. He was encouraged to continue to with tobacco cessation and was provided  information on relapse prevention. Patient received information about combination therapy, tobacco cessation classes, quit line, and quit smoking apps in case of a relapse. Patient demonstrated understanding of this material.Staff will continue to provide encouragement and follow up with the patient throughout the program.         Comments: initial ITP

## 2024-02-19 ENCOUNTER — Encounter

## 2024-02-21 ENCOUNTER — Encounter

## 2024-02-23 ENCOUNTER — Other Ambulatory Visit (HOSPITAL_COMMUNITY): Payer: Self-pay

## 2024-02-23 ENCOUNTER — Other Ambulatory Visit: Payer: Self-pay

## 2024-02-23 ENCOUNTER — Other Ambulatory Visit (HOSPITAL_COMMUNITY): Payer: Self-pay | Admitting: *Deleted

## 2024-02-23 DIAGNOSIS — E782 Mixed hyperlipidemia: Secondary | ICD-10-CM

## 2024-02-23 DIAGNOSIS — I5022 Chronic systolic (congestive) heart failure: Secondary | ICD-10-CM

## 2024-02-23 DIAGNOSIS — Z7901 Long term (current) use of anticoagulants: Secondary | ICD-10-CM

## 2024-02-23 DIAGNOSIS — I10 Essential (primary) hypertension: Secondary | ICD-10-CM

## 2024-02-23 DIAGNOSIS — Z95811 Presence of heart assist device: Secondary | ICD-10-CM

## 2024-02-23 MED ORDER — AMIODARONE HCL 200 MG PO TABS: 200.0000 mg | ORAL_TABLET | Freq: Every day | ORAL | 3 refills | Status: DC

## 2024-02-23 MED ORDER — SPIRONOLACTONE 25 MG PO TABS
25.0000 mg | ORAL_TABLET | Freq: Every day | ORAL | 1 refills | Status: AC
  Filled 2024-02-23: qty 30, 30d supply, fill #0
  Filled 2024-03-31: qty 30, 30d supply, fill #1

## 2024-02-23 MED ORDER — EZETIMIBE 10 MG PO TABS
10.0000 mg | ORAL_TABLET | Freq: Every day | ORAL | 3 refills | Status: AC
  Filled 2024-02-23: qty 30, 30d supply, fill #0
  Filled 2024-03-31: qty 90, 90d supply, fill #1
  Filled 2024-04-01: qty 30, 30d supply, fill #1

## 2024-02-23 MED ORDER — LOSARTAN POTASSIUM 25 MG PO TABS: 25.0000 mg | ORAL_TABLET | Freq: Every day | ORAL | 3 refills | Status: DC

## 2024-02-23 MED ORDER — PANTOPRAZOLE SODIUM 40 MG PO TBEC
40.0000 mg | DELAYED_RELEASE_TABLET | Freq: Every day | ORAL | 3 refills | Status: AC
  Filled 2024-02-23: qty 90, 90d supply, fill #0

## 2024-02-23 MED ORDER — ALLOPURINOL 100 MG PO TABS
100.0000 mg | ORAL_TABLET | Freq: Every day | ORAL | 3 refills | Status: AC
  Filled 2024-02-23 – 2024-03-31 (×2): qty 90, 90d supply, fill #0

## 2024-02-23 MED ORDER — DIGOXIN 125 MCG PO TABS
0.1250 mg | ORAL_TABLET | Freq: Every day | ORAL | 3 refills | Status: AC
  Filled 2024-02-23: qty 90, 90d supply, fill #0

## 2024-02-23 MED ORDER — TORSEMIDE 20 MG PO TABS: 20.0000 mg | ORAL_TABLET | ORAL | 3 refills | Status: AC

## 2024-02-23 MED ORDER — COLCHICINE 0.6 MG PO TABS
0.6000 mg | ORAL_TABLET | Freq: Two times a day (BID) | ORAL | 6 refills | Status: DC
  Filled 2024-02-23: qty 60, 30d supply, fill #0

## 2024-02-23 MED ORDER — EMPAGLIFLOZIN 10 MG PO TABS
10.0000 mg | ORAL_TABLET | Freq: Every day | ORAL | 3 refills | Status: AC
  Filled 2024-02-23: qty 90, 90d supply, fill #0

## 2024-02-23 MED ORDER — WARFARIN SODIUM 5 MG PO TABS
ORAL_TABLET | ORAL | 3 refills | Status: AC
  Filled 2024-04-05: qty 45, 30d supply, fill #0

## 2024-02-23 MED ORDER — DIGOXIN 125 MCG PO TABS
0.1250 mg | ORAL_TABLET | Freq: Every day | ORAL | 1 refills | Status: DC
  Filled 2024-02-23: qty 30, 30d supply, fill #0

## 2024-02-23 MED ORDER — METFORMIN HCL 500 MG PO TABS
500.0000 mg | ORAL_TABLET | Freq: Every day | ORAL | 3 refills | Status: AC
  Filled 2024-03-31: qty 90, 90d supply, fill #0

## 2024-02-23 MED ORDER — SACUBITRIL-VALSARTAN 24-26 MG PO TABS
1.0000 | ORAL_TABLET | Freq: Two times a day (BID) | ORAL | 6 refills | Status: DC
  Filled 2024-02-23: qty 60, 30d supply, fill #0

## 2024-02-23 MED ORDER — MEXILETINE HCL 150 MG PO CAPS
300.0000 mg | ORAL_CAPSULE | Freq: Two times a day (BID) | ORAL | 3 refills | Status: AC
  Filled 2024-02-23: qty 360, 90d supply, fill #0

## 2024-02-26 ENCOUNTER — Other Ambulatory Visit: Payer: Self-pay

## 2024-02-26 ENCOUNTER — Encounter

## 2024-02-28 ENCOUNTER — Other Ambulatory Visit (HOSPITAL_COMMUNITY): Payer: Self-pay

## 2024-02-28 ENCOUNTER — Encounter

## 2024-02-28 ENCOUNTER — Encounter: Payer: Self-pay | Admitting: *Deleted

## 2024-02-28 ENCOUNTER — Telehealth: Payer: Self-pay | Admitting: Emergency Medicine

## 2024-02-28 DIAGNOSIS — Z95811 Presence of heart assist device: Secondary | ICD-10-CM

## 2024-02-28 DIAGNOSIS — Z7901 Long term (current) use of anticoagulants: Secondary | ICD-10-CM

## 2024-02-28 NOTE — Telephone Encounter (Signed)
 Patient states that he has not been able to attend any sessions yet due to having stomach issues and trouble with transportation. Still wishes to participate in the program and will keep us  posted as to when he can start.

## 2024-02-28 NOTE — Progress Notes (Signed)
 Cardiac Individual Treatment Plan  Patient Details  Name: Philip Monroe MRN: [id number] Date of Birth: January 15, 1976 Referring Provider:   Flowsheet Row Cardiac Rehab from 02/14/2024 in [facility] Cardiac and Pulmonary Rehab  Referring Provider Dr. Cherrie    Initial Encounter Date:  Flowsheet Row Cardiac Rehab from 02/14/2024 in [facility] Cardiac and Pulmonary Rehab  Date 02/14/24    Visit Diagnosis: LVAD (left ventricular assist device) present [facility])  Patient's Home Medications on Admission: Current Medications[1]  Past Medical History: Past Medical History:  Diagnosis Date   Aortic root dilatation    a. 07/2022 Echo: Ao root 4.4cm, Asc Ao 4.0cm.   Asthma    as a child, no problems as an adult, rarely uses inhaler   CAD (coronary artery disease)    a. 08/2017 Cath: LM nl, LAD 13m/d, D1 70ost, LCX 15m, OM1 90 inf branch, 60 sup branch, OM2 small, RCA 30 diffuse, RPDA 100 w/ L->R collats; b. 07/2022 Echo: LM nl, LAD 45m, D1 80, LCX 165m, OM1 80, RCA 33m, RPDA 100-->No targets for PCI/CABG-->Med rx.   CHF (congestive heart failure) (HCC)    takes lasix    Chronic HFrEF (heart failure with reduced ejection fraction) (HCC)    a. 03/2015 Echo: EF 35-40%; b. 08/2017 Echo: EF 15%; c. 08/2017 cMRI: EF 35%; d. 06/2018 Echo: EF 10-15%; e. 07/2022 Echo: EF 15-20%, grIII DD, mild MR, sev dil LA, mod-sev PH, Ao root 4.4cm, Asc Ao 4.0cm; f. 07/2022 RHC: RA 4, RV 38/3, PA 43/18 (26), PCWP 11. CO/CI 5.3/1.8.   CKD (chronic kidney disease), stage II    Diabetes mellitus without complication (HCC)    type 2   Dyspnea    with exertion   Gout    Hyperlipidemia LDL goal <70    Hypertension    Ischemic cardiomyopathy    a. 03/2015 Echo: EF 35-40%; b. 08/2017 Echo: EF 15%; c. 08/2017 cMRI: EF 35%; d. 06/2018 Echo: EF 10-15%; e. 07/2022 Echo: EF 15-20%, grIII DD.   IVCD (intraventricular conduction defect)    Left ventricular noncompaction [facility])    Morbid obesity (HCC)    NSVT (nonsustained ventricular tachycardia) (HCC)     a. 09/2022 Event monitor: predominantly sinus rhythm, avg HR 81 (46-167). 17 runs NSVT (fastest/longest 13 beats x 167). Rare PACs, 17.5% PVC burden.   PVC's (premature ventricular contractions)    a. 09/2022 Event Monitor: 17.5% PVC burden.   Statin intolerance    Tobacco abuse     Tobacco Use: Tobacco Use History[2]  Labs: Review Flowsheet  More data exists      Latest Ref Rng & Units 01/20/2024 01/21/2024 01/22/2024 01/23/2024 01/24/2024  Labs for ITP Cardiac and Pulmonary Rehab  O2 Saturation % 62.9  63.3  67.4  58.7  67.2      Exercise Target Goals: Exercise Program Goal: Individual exercise prescription set using results from initial 6 min walk test and THRR while considering  patients activity barriers and safety.   Exercise Prescription Goal: Initial exercise prescription builds to 30-45 minutes a day of aerobic activity, 2-3 days per week.  Home exercise guidelines will be given to patient during program as part of exercise prescription that the participant will acknowledge.   Education: Aerobic Exercise: - Group verbal and visual presentation on the components of exercise prescription. Introduces F.I.T.T principle from ACSM for exercise prescriptions.  Reviews F.I.T.T. principles of aerobic exercise including progression. Written material provided at class time. Flowsheet Row Cardiac Rehab from 02/14/2024 in [facility] Cardiac and Pulmonary  Rehab  Education need identified 02/14/24    Education: Resistance Exercise: - Group verbal and visual presentation on the components of exercise prescription. Introduces F.I.T.T principle from ACSM for exercise prescriptions  Reviews F.I.T.T. principles of resistance exercise including progression. Written material provided at class time.    Education: Exercise & Equipment Safety: - Individual verbal instruction and demonstration of equipment use and safety with use of the equipment. Flowsheet Row Cardiac Rehab from 02/14/2024 in [facility]  Cardiac and Pulmonary Rehab  Date 02/14/24  Educator [facility]  Instruction Review Code 1- Verbalizes Understanding    Education: Exercise Physiology & General Exercise Guidelines: - Group verbal and written instruction with models to review the exercise physiology of the cardiovascular system and associated critical values. Provides general exercise guidelines with specific guidelines to those with heart or lung disease. Written material provided at class time. Flowsheet Row Cardiac Rehab from 02/14/2024 in [facility] Cardiac and Pulmonary Rehab  Education need identified 02/14/24    Education: Flexibility, Balance, Mind/Body Relaxation: - Group verbal and visual presentation with interactive activity on the components of exercise prescription. Introduces F.I.T.T principle from ACSM for exercise prescriptions. Reviews F.I.T.T. principles of flexibility and balance exercise training including progression. Also discusses the mind body connection.  Reviews various relaxation techniques to help reduce and manage stress (i.e. Deep breathing, progressive muscle relaxation, and visualization). Balance handout provided to take home. Written material provided at class time.   Activity Barriers & Risk Stratification:  Activity Barriers & Cardiac Risk Stratification - 02/14/24 1305       Activity Barriers & Cardiac Risk Stratification   Activity Barriers Decreased Ventricular Function;Muscular Weakness    Cardiac Risk Stratification High          6 Minute Walk:  6 Minute Walk     Row Name 02/14/24 1304         6 Minute Walk   Phase Initial     Distance 950 feet     Walk Time 6 minutes     # of Rest Breaks 0     MPH 1.8     METS 2.42     RPE 13     Perceived Dyspnea  0     VO2 Peak 8.49     Symptoms No     Resting HR 97 bpm     Resting BP --  Doppler 82     Resting Oxygen Saturation  95 %     Exercise Oxygen Saturation  during 6 min walk 96 %     Max Ex. HR 117 bpm     Max Ex. BP --   Doppler 92     2 Minute Post BP --  Doppler 88        Oxygen Initial Assessment:   Oxygen Re-Evaluation:   Oxygen Discharge (Final Oxygen Re-Evaluation):   Initial Exercise Prescription:  Initial Exercise Prescription - 02/14/24 1300       Date of Initial Exercise RX and Referring Provider   Date 02/14/24    Referring Provider Dr. Bensimhon      Oxygen   Maintain Oxygen Saturation 88% or higher      NuStep   Level 2    SPM 80    Minutes 15    METs 2.42      REL-XR   Level 1    Speed 50    Minutes 15    METs 2.42      Track   Laps 20  Minutes 15    METs 2.09      Prescription Details   Frequency (times per week) 2    Duration Progress to 30 minutes of continuous aerobic without signs/symptoms of physical distress      Intensity   THRR 40-80% of Max Heartrate 127-157    Ratings of Perceived Exertion 11-13    Perceived Dyspnea 0-4      Progression   Progression Continue to progress workloads to maintain intensity without signs/symptoms of physical distress.      Resistance Training   Training Prescription Yes    Weight 2    Reps 10-15          Perform Capillary Blood Glucose checks as needed.  Exercise Prescription Changes:   Exercise Prescription Changes     Row Name 02/14/24 1300             Response to Exercise   Blood Pressure (Admit) --  Doppler 82       Blood Pressure (Exercise) --  Doppler 92       Blood Pressure (Exit) --  Doppler 88       Heart Rate (Admit) 97 bpm       Heart Rate (Exercise) 117 bpm       Heart Rate (Exit) 96 bpm       Oxygen Saturation (Admit) 95 %       Oxygen Saturation (Exercise) 96 %       Oxygen Saturation (Exit) 96 %       Rating of Perceived Exertion (Exercise) 13       Perceived Dyspnea (Exercise) 0       Symptoms none       Comments 6 MWT results          Exercise Comments:   Exercise Goals and Review:   Exercise Goals     Row Name 02/14/24 1312             Exercise Goals    Increase Physical Activity Yes       Intervention Provide advice, education, support and counseling about physical activity/exercise needs.;Develop an individualized exercise prescription for aerobic and resistive training based on initial evaluation findings, risk stratification, comorbidities and participant's personal goals.       Expected Outcomes Short Term: Attend rehab on a regular basis to increase amount of physical activity.;Long Term: Add in home exercise to make exercise part of routine and to increase amount of physical activity.;Long Term: Exercising regularly at least 3-5 days a week.       Increase Strength and Stamina Yes       Intervention Provide advice, education, support and counseling about physical activity/exercise needs.;Develop an individualized exercise prescription for aerobic and resistive training based on initial evaluation findings, risk stratification, comorbidities and participant's personal goals.       Expected Outcomes Short Term: Increase workloads from initial exercise prescription for resistance, speed, and METs.;Short Term: Perform resistance training exercises routinely during rehab and add in resistance training at home;Long Term: Improve cardiorespiratory fitness, muscular endurance and strength as measured by increased METs and functional capacity ( )       Able to understand and use rate of perceived exertion (RPE) scale Yes       Intervention Provide education and explanation on how to use RPE scale       Expected Outcomes Short Term: Able to use RPE daily in rehab to express subjective intensity level;Long Term:  Able to use RPE to  guide intensity level when exercising independently       Able to understand and use Dyspnea scale Yes       Intervention Provide education and explanation on how to use Dyspnea scale       Expected Outcomes Short Term: Able to use Dyspnea scale daily in rehab to express subjective sense of shortness of breath during  exertion;Long Term: Able to use Dyspnea scale to guide intensity level when exercising independently       Knowledge and understanding of Target Heart Rate Range (THRR) Yes       Intervention Provide education and explanation of THRR including how the numbers were predicted and where they are located for reference       Expected Outcomes Short Term: Able to state/look up THRR;Long Term: Able to use THRR to govern intensity when exercising independently;Short Term: Able to use daily as guideline for intensity in rehab       Able to check pulse independently Yes       Intervention Provide education and demonstration on how to check pulse in carotid and radial arteries.;Review the importance of being able to check your own pulse for safety during independent exercise       Expected Outcomes Short Term: Able to explain why pulse checking is important during independent exercise;Long Term: Able to check pulse independently and accurately       Understanding of Exercise Prescription Yes       Intervention Provide education, explanation, and written materials on patient's individual exercise prescription       Expected Outcomes Short Term: Able to explain program exercise prescription;Long Term: Able to explain home exercise prescription to exercise independently          Exercise Goals Re-Evaluation :   Discharge Exercise Prescription (Final Exercise Prescription Changes):  Exercise Prescription Changes - 02/14/24 1300       Response to Exercise   Blood Pressure (Admit) --   Doppler 82   Blood Pressure (Exercise) --   Doppler 92   Blood Pressure (Exit) --   Doppler 88   Heart Rate (Admit) 97 bpm    Heart Rate (Exercise) 117 bpm    Heart Rate (Exit) 96 bpm    Oxygen Saturation (Admit) 95 %    Oxygen Saturation (Exercise) 96 %    Oxygen Saturation (Exit) 96 %    Rating of Perceived Exertion (Exercise) 13    Perceived Dyspnea (Exercise) 0    Symptoms none    Comments 6 MWT results           Nutrition:  Target Goals: Understanding of nutrition guidelines, daily intake of sodium 1500mg , cholesterol 200mg , calories 30% from fat and 7% or less from saturated fats, daily to have 5 or more servings of fruits and vegetables.  Education: Nutrition 1 -Group instruction provided by verbal, written material, interactive activities, discussions, models, and posters to present general guidelines for heart healthy nutrition including macronutrients, label reading, and promoting whole foods over processed counterparts. Education serves as pensions consultant of discussion of heart healthy eating for all. Written material provided at class time.    Education: Nutrition 2 -Group instruction provided by verbal, written material, interactive activities, discussions, models, and posters to present general guidelines for heart healthy nutrition including sodium, cholesterol, and saturated fat. Providing guidance of habit forming to improve blood pressure, cholesterol, and body weight. Written material provided at class time.     Biometrics:  Pre Biometrics - 02/14/24 1313  Pre Biometrics   Height 6' 2 (1.88 m)    Weight 329 lb 12.8 oz (149.6 kg)    Waist Circumference 52 inches    Hip Circumference 53 inches    Waist to Hip Ratio 0.98 %    BMI (Calculated) 42.33    Single Leg Stand 4.57 seconds           Nutrition Therapy Plan and Nutrition Goals:  Nutrition Therapy & Goals - 02/14/24 1318       Intervention Plan   Intervention Prescribe, educate and counsel regarding individualized specific dietary modifications aiming towards targeted core components such as weight, hypertension, lipid management, diabetes, heart failure and other comorbidities.    Expected Outcomes Short Term Goal: Understand basic principles of dietary content, such as calories, fat, sodium, cholesterol and nutrients.;Short Term Goal: A plan has been developed with personal nutrition goals set during dietitian  appointment.;Long Term Goal: Adherence to prescribed nutrition plan.          Nutrition Assessments:  MEDIFICTS Score Key: >=70 Need to make dietary changes  40-70 Heart Healthy Diet <= 40 Therapeutic Level Cholesterol Diet  Flowsheet Row Cardiac Rehab from 02/14/2024 in [facility] Cardiac and Pulmonary Rehab  Picture Your Plate Total Score on Admission 63   Picture Your Plate Scores: <59 Unhealthy dietary pattern with much room for improvement. 41-50 Dietary pattern unlikely to meet recommendations for good health and room for improvement. 51-60 More healthful dietary pattern, with some room for improvement.  >60 Healthy dietary pattern, although there may be some specific behaviors that could be improved.    Nutrition Goals Re-Evaluation:   Nutrition Goals Discharge (Final Nutrition Goals Re-Evaluation):   Psychosocial: Target Goals: Acknowledge presence or absence of significant depression and/or stress, maximize coping skills, provide positive support system. Participant is able to verbalize types and ability to use techniques and skills needed for reducing stress and depression.   Education: Stress, Anxiety, and Depression - Group verbal and visual presentation to define topics covered.  Reviews how body is impacted by stress, anxiety, and depression.  Also discusses healthy ways to reduce stress and to treat/manage anxiety and depression. Written material provided at class time.   Education: Sleep Hygiene -Provides group verbal and written instruction about how sleep can affect your health.  Define sleep hygiene, discuss sleep cycles and impact of sleep habits. Review good sleep hygiene tips.   Initial Review & Psychosocial Screening:  Initial Psych Review & Screening - 02/07/24 1013       Initial Review   Current issues with Current Stress Concerns;Current Sleep Concerns    Source of Stress Concerns Chronic Illness;Unable to perform yard/household activities;Unable to  participate in former interests or hobbies      Family Dynamics   Good Support System? Yes   family     Barriers   Psychosocial barriers to participate in program There are no identifiable barriers or psychosocial needs.;The patient should benefit from training in stress management and relaxation.      Screening Interventions   Interventions Encouraged to exercise;Provide feedback about the scores to participant;To provide support and resources with identified psychosocial needs    Expected Outcomes Long Term Goal: Stressors or current issues are controlled or eliminated.;Short Term goal: Utilizing psychosocial counselor, staff and physician to assist with identification of specific Stressors or current issues interfering with healing process. Setting desired goal for each stressor or current issue identified.;Short Term goal: Identification and review with participant of any Quality of Life or Depression  concerns found by scoring the questionnaire.;Long Term goal: The participant improves quality of Life and PHQ9 Scores as seen by post scores and/or verbalization of changes          Quality of Life Scores:   Quality of Life - 02/14/24 1318       Quality of Life   Select Quality of Life      Quality of Life Scores   Health/Function Pre 17.68 %    Socioeconomic Pre 16.93 %    Psych/Spiritual Pre 21.43 %    Family Pre 23 %    GLOBAL Pre 19 %         Scores of 19 and below usually indicate a poorer quality of life in these areas.  A difference of  2-3 points is a clinically meaningful difference.  A difference of 2-3 points in the total score of the Quality of Life Index has been associated with significant improvement in overall quality of life, self-image, physical symptoms, and general health in studies assessing change in quality of life.  PHQ-9: Review Flowsheet       02/14/2024 06/11/2015 05/18/2015 04/20/2015  Depression screen PHQ 2/9  Decreased Interest 0 0 0 0  Down,  Depressed, Hopeless 0 0 0 0  PHQ - 2 Score 0 0 0 0  Altered sleeping 1 - - -  Tired, decreased energy 0 - - -  Change in appetite 1 - - -  Feeling bad or failure about yourself  0 - - -  Trouble concentrating 0 - - -  Moving slowly or fidgety/restless 0 - - -  Suicidal thoughts 0 - - -  PHQ-9 Score 2 - - -  Difficult doing work/chores Not difficult at all - - -   Interpretation of Total Score  Total Score Depression Severity:  1-4 = Minimal depression, 5-9 = Mild depression, 10-14 = Moderate depression, 15-19 = Moderately severe depression, 20-27 = Severe depression   Psychosocial Evaluation and Intervention:  Psychosocial Evaluation - 02/07/24 1020       Psychosocial Evaluation & Interventions   Comments Mr. Villavicencio is coming to cardiac rehab post LVAD. He states there has been some stress revolving around getting used to his LVAD and figuring out what his new normal is. His sleep has also been altered because he has a hard time getting comfortable, wakes up sweaty and in pain sometimes. He mentions he has a good support system. He notes he is aware this will all take time to get used to and he is ready to get involved in the program to learn more about heart healthy living.    Expected Outcomes Short: attend cardiac rehab for education and exercise Long: develop and maintain positive self care habits    Continue Psychosocial Services  Follow up required by staff          Psychosocial Re-Evaluation:   Psychosocial Discharge (Final Psychosocial Re-Evaluation):   Vocational Rehabilitation: Provide vocational rehab assistance to qualifying candidates.   Vocational Rehab Evaluation & Intervention:  Vocational Rehab - 02/07/24 1013       Initial Vocational Rehab Evaluation & Intervention   Assessment shows need for Vocational Rehabilitation No          Education: Education Goals: Education classes will be provided on a variety of topics geared toward better understanding  of heart health and risk factor modification. Participant will state understanding/return demonstration of topics presented as noted by education test scores.  Learning Barriers/Preferences:  Learning Barriers/Preferences -  02/07/24 1013       Learning Barriers/Preferences   Learning Barriers None    Learning Preferences None          General Cardiac Education Topics:  AED/CPR: - Group verbal and written instruction with the use of models to demonstrate the basic use of the AED with the basic ABC's of resuscitation.   Test and Procedures: - Group verbal and visual presentation and models provide information about basic cardiac anatomy and function. Reviews the testing methods done to diagnose heart disease and the outcomes of the test results. Describes the treatment choices: Medical Management, Angioplasty, or Coronary Bypass Surgery for treating various heart conditions including Myocardial Infarction, Angina, Valve Disease, and Cardiac Arrhythmias. Written material provided at class time. Flowsheet Row Cardiac Rehab from 02/14/2024 in [facility] Cardiac and Pulmonary Rehab  Education need identified 02/14/24    Medication Safety: - Group verbal and visual instruction to review commonly prescribed medications for heart and lung disease. Reviews the medication, class of the drug, and side effects. Includes the steps to properly store meds and maintain the prescription regimen. Written material provided at class time.   Intimacy: - Group verbal instruction through game format to discuss how heart and lung disease can affect sexual intimacy. Written material provided at class time.   Know Your Numbers and Heart Failure: - Group verbal and visual instruction to discuss disease risk factors for cardiac and pulmonary disease and treatment options.  Reviews associated critical values for Overweight/Obesity, Hypertension, Cholesterol, and Diabetes.  Discusses basics of heart failure:  signs/symptoms and treatments.  Introduces Heart Failure Zone chart for action plan for heart failure. Written material provided at class time.   Infection Prevention: - Provides verbal and written material to individual with discussion of infection control including proper hand washing and proper equipment cleaning during exercise session. Flowsheet Row Cardiac Rehab from 02/14/2024 in [facility] Cardiac and Pulmonary Rehab  Date 02/14/24  Educator [facility]  Instruction Review Code 1- Verbalizes Understanding    Falls Prevention: - Provides verbal and written material to individual with discussion of falls prevention and safety. Flowsheet Row Cardiac Rehab from 02/14/2024 in [facility] Cardiac and Pulmonary Rehab  Date 02/14/24  Educator [facility]  Instruction Review Code 1- Verbalizes Understanding    Other: -Provides group and verbal instruction on various topics (see comments)   Knowledge Questionnaire Score:  Knowledge Questionnaire Score - 02/14/24 1315       Knowledge Questionnaire Score   Pre Score 23/26          Core Components/Risk Factors/Patient Goals at Admission:  Personal Goals and Risk Factors at Admission - 02/14/24 1316       Core Components/Risk Factors/Patient Goals on Admission    Weight Management Yes;Weight Loss    Intervention Weight Management: Develop a combined nutrition and exercise program designed to reach desired caloric intake, while maintaining appropriate intake of nutrient and fiber, sodium and fats, and appropriate energy expenditure required for the weight goal.;Weight Management: Provide education and appropriate resources to help participant work on and attain dietary goals.;Weight Management/Obesity: Establish reasonable short term and long term weight goals.;Obesity: Provide education and appropriate resources to help participant work on and attain dietary goals.    Admit Weight 329 lb 12.8 oz (149.6 kg)    Goal Weight: Short Term 320 lb (145.2 kg)    Goal  Weight: Long Term 250 lb (113.4 kg)    Expected Outcomes Short Term: Continue to assess and modify interventions until short term weight  is achieved;Long Term: Adherence to nutrition and physical activity/exercise program aimed toward attainment of established weight goal;Weight Loss: Understanding of general recommendations for a balanced deficit meal plan, which promotes 1-2 lb weight loss per week and includes a negative energy balance of [phone] kcal/d;Understanding recommendations for meals to include 15-35% energy as protein, 25-35% energy from fat, 35-60% energy from carbohydrates, less than 200mg  of dietary cholesterol, 20-35 gm of total fiber daily;Understanding of distribution of calorie intake throughout the day with the consumption of 4-5 meals/snacks    Tobacco Cessation Yes    Number of packs per day 0   recent quit   Intervention Assist the participant in steps to quit. Provide individualized education and counseling about committing to Tobacco Cessation, relapse prevention, and pharmacological support that can be provided by physician.;Education officer, environmental, assist with locating and accessing local/national Quit Smoking programs, and support quit date choice.    Expected Outcomes Short Term: Will demonstrate readiness to quit, by selecting a quit date.;Short Term: Will quit all tobacco product use, adhering to prevention of relapse plan.;Long Term: Complete abstinence from all tobacco products for at least 12 months from quit date.    Diabetes Yes    Intervention Provide education about signs/symptoms and action to take for hypo/hyperglycemia.;Provide education about proper nutrition, including hydration, and aerobic/resistive exercise prescription along with prescribed medications to achieve blood glucose in normal ranges: Fasting glucose 65-99 mg/dL    Expected Outcomes Short Term: Participant verbalizes understanding of the signs/symptoms and immediate care of hyper/hypoglycemia,  proper foot care and importance of medication, aerobic/resistive exercise and nutrition plan for blood glucose control.;Long Term: Attainment of HbA1C < 7%.    Heart Failure Yes    Intervention Provide a combined exercise and nutrition program that is supplemented with education, support and counseling about heart failure. Directed toward relieving symptoms such as shortness of breath, decreased exercise tolerance, and extremity edema.    Expected Outcomes Improve functional capacity of life;Short term: Attendance in program 2-3 days a week with increased exercise capacity. Reported lower sodium intake. Reported increased fruit and vegetable intake. Reports medication compliance.;Short term: Daily weights obtained and reported for increase. Utilizing diuretic protocols set by physician.;Long term: Adoption of self-care skills and reduction of barriers for early signs and symptoms recognition and intervention leading to self-care maintenance.    Hypertension Yes    Intervention Provide education on lifestyle modifcations including regular physical activity/exercise, weight management, moderate sodium restriction and increased consumption of fresh fruit, vegetables, and low fat dairy, alcohol moderation, and smoking cessation.;Monitor prescription use compliance.    Expected Outcomes Short Term: Continued assessment and intervention until BP is < 140/4mm HG in hypertensive participants. < 130/61mm HG in hypertensive participants with diabetes, heart failure or chronic kidney disease.;Long Term: Maintenance of blood pressure at goal levels.    Lipids Yes    Intervention Provide education and support for participant on nutrition & aerobic/resistive exercise along with prescribed medications to achieve LDL 70mg , HDL >40mg .    Expected Outcomes Short Term: Participant states understanding of desired cholesterol values and is compliant with medications prescribed. Participant is following exercise prescription  and nutrition guidelines.;Long Term: Cholesterol controlled with medications as prescribed, with individualized exercise RX and with personalized nutrition plan. Value goals: LDL < 70mg , HDL > 40 mg.          Education:Diabetes - Individual verbal and written instruction to review signs/symptoms of diabetes, desired ranges of glucose level fasting, after meals and with exercise. Acknowledge that pre  and post exercise glucose checks will be done for 3 sessions at entry of program. Flowsheet Row Cardiac Rehab from 02/14/2024 in [facility] Cardiac [facility] Pul[facility]  Date 02/14/24  Educator [facility]  Instruction Review Code 1- Verbalizes Understanding    Core Components/Risk Factors/Patient Goals Review:   Goals and Risk Factor Review     Row Name 02/14/24 1303             Core Components/Risk Factors/Patient Goals Review   Personal Goals Review Tobacco Cessation       Review Devery has recently quit tobacco use within the last 6 months. Intervention for relapse prevention was provided at the initial medical review. He was encouraged to continue to with tobacco cessation and was provided information on relapse prevention. Patient received information about combination therapy, tobacco cessation classes, quit line, and quit smoking apps in case of a relapse. Patient demonstrated understanding of this material.Staff will continue to provide encouragement and follow up with the patient throughout the program.          Core Components/Risk Factors/Patient Goals at Discharge (Final Review):   Goals and Risk Factor Review - 02/14/24 1303       Core Components/Risk Factors/Patient Goals Review   Personal Goals Review Tobacco Cessation    Review Daimon has recently quit tobacco use within the last 6 months. Intervention for relapse prevention was provided at the initial medical review. He was encouraged to continue to with tobacco cessation and was provided information on relapse prevention. Patient  received information about combination therapy, tobacco cessation classes, quit line, and quit smoking apps in case of a relapse. Patient demonstrated understanding of this material.Staff will continue to provide encouragement and follow up with the patient throughout the program.          ITP Comments:  ITP Comments     Row Name 02/07/24 1024 02/14/24 1302 02/28/24 1306       ITP Comments Initial phone call completed. Diagnosis can be found in [facility] 11/19. EP Orientation scheduled for Wednesday 12/3 at 10:30am. Completed and gym orientation for cardiac rehab. Initial ITP created and sent for review to Dr. Oneil Pinal, Medical Director. Wofford has recently quit tobacco use within the last 6 months. Intervention for relapse prevention was provided at the initial medical review. He was encouraged to continue to with tobacco cessation and was provided information on relapse prevention. Patient received information about combination therapy, tobacco cessation classes, quit line, and quit smoking apps in case of a relapse. Patient demonstrated understanding of this material.Staff will continue to provide encouragement and follow up with the patient throughout the program. 30 Day review completed. Medical Director ITP review done, changes made as directed, and signed approval by Medical Director. New to program        Comments: 30 Day Review     [1]  Current Outpatient Medications:    Accu-Chek Softclix Lancets lancets, Use up to four times daily as directed. (FOR ICD-10 E10.9, E11.9)., Disp: 100 each, Rfl: 0   albuterol  (VENTOLIN  HFA) 108 (90 Base) MCG/ACT inhaler, Inhale 1-2 puffs into the lungs every 6 (six) hours as needed for wheezing or shortness of breath., Disp: , Rfl:    allopurinol  (ZYLOPRIM ) 100 MG tablet, Take 1 tablet (100 mg total) by mouth daily., Disp: 90 tablet, Rfl: 3   amiodarone  (PACERONE ) 200 MG tablet, Take 1 tablet (200 mg total) by mouth daily., Disp: 90 tablet, Rfl: 3    Blood Glucose Monitoring Suppl (BLOOD  GLUCOSE MONITOR SYSTEM) w/Device KIT, Use up to four times daily as directed. (FOR ICD-10 E10.9, E11.9)., Disp: 1 kit, Rfl: 0   colchicine  0.6 MG tablet, Take 1 tablet (0.6 mg total) by mouth daily., Disp: 30 tablet, Rfl: 5   colchicine  0.6 MG tablet, Take 1 tablet (0.6 mg total) by mouth 2 (two) times daily., Disp: 60 tablet, Rfl: 6   digoxin  (LANOXIN ) 0.125 MG tablet, Take 1 tablet (0.125 mg total) by mouth daily., Disp: 90 tablet, Rfl: 3   empagliflozin  (JARDIANCE ) 10 MG TABS tablet, Take 1 tablet (10 mg total) by mouth daily., Disp: 90 tablet, Rfl: 3   ezetimibe  (ZETIA ) 10 MG tablet, Take 1 tablet (10 mg total) by mouth at bedtime., Disp: 90 tablet, Rfl: 3   gabapentin  (NEURONTIN ) 100 MG capsule, Take 2 capsules (200 mg total) by mouth daily before breakfast., Disp: 60 capsule, Rfl: 5   gabapentin  (NEURONTIN ) 300 MG capsule, Take 2 capsules (600 mg total) by mouth daily at 8 pm., Disp: 60 capsule, Rfl: 5   glucose blood (ACCU-CHEK GUIDE TEST) test strip, Use up to four times daily as directed. (FOR ICD-10 E10.9, E11.9)., Disp: 100 each, Rfl: 0   Lancet Device MISC, 1 each by Does not apply route as directed. Dispense based on patient and insurance preference. Use up to four times daily as directed. (FOR ICD-10 E10.9, E11.9)., Disp: 1 each, Rfl: 0   losartan  (COZAAR ) 25 MG tablet, Take 1 tablet (25 mg total) by mouth daily., Disp: 90 tablet, Rfl: 3   metFORMIN  (GLUCOPHAGE ) 500 MG tablet, Take 1 tablet (500 mg total) by mouth daily with breakfast., Disp: 90 tablet, Rfl: 3   mexiletine (MEXITIL ) 150 MG capsule, Take 2 capsules (300 mg total) by mouth 2 (two) times daily., Disp: 360 capsule, Rfl: 3   pantoprazole  (PROTONIX ) 40 MG tablet, Take 1 tablet (40 mg total) by mouth daily., Disp: 90 tablet, Rfl: 3   sacubitril -valsartan  (ENTRESTO ) 24-26 MG, Take 1 tablet by mouth 2 (two) times daily., Disp: 180 tablet, Rfl: 3   spironolactone  (ALDACTONE ) 25 MG tablet, Take  1 tablet (25 mg total) by mouth daily., Disp: 30 tablet, Rfl: 1   torsemide  (DEMADEX ) 20 MG tablet, Take 1 tablet (20 mg total) by mouth every other day for 30 doses., Disp: 45 tablet, Rfl: 3   traMADol  (ULTRAM ) 50 MG tablet, Take 1-2 tablets (50-100 mg total) by mouth every 8 (eight) hours as needed., Disp: 30 tablet, Rfl: 1   warfarin (COUMADIN ) 5 MG tablet, Take 5 mg (1 tablet) daily except take 7.5 mg (1.5 tablets) on Monday, Wednesday and Friday, or as directed by the heart failure clinic, Disp: 108 tablet, Rfl: 3 [2]  Social History Tobacco Use  Smoking Status Former   Current packs/day: 0.00   Average packs/day: 1.0 packs/day   Types: Cigarettes   Quit date: 03/20/2015   Years since quitting: 8.9  Smokeless Tobacco Never  Tobacco Comments   Stopped smoking 11/24/2023

## 2024-02-29 ENCOUNTER — Ambulatory Visit (HOSPITAL_COMMUNITY)
Admission: RE | Admit: 2024-02-29 | Discharge: 2024-02-29 | Disposition: A | Discharge status: Home / Self Care | Source: Ambulatory Visit | Attending: Internal Medicine | Admitting: Internal Medicine

## 2024-02-29 ENCOUNTER — Ambulatory Visit (HOSPITAL_COMMUNITY): Payer: Self-pay | Admitting: Pharmacist

## 2024-02-29 VITALS — BP 134/0 | HR 81 | Wt 338.0 lb

## 2024-02-29 DIAGNOSIS — Z7901 Long term (current) use of anticoagulants: Secondary | ICD-10-CM | POA: Insufficient documentation

## 2024-02-29 DIAGNOSIS — Z4801 Encounter for change or removal of surgical wound dressing: Secondary | ICD-10-CM | POA: Insufficient documentation

## 2024-02-29 DIAGNOSIS — Z95811 Presence of heart assist device: Secondary | ICD-10-CM | POA: Insufficient documentation

## 2024-02-29 DIAGNOSIS — H532 Diplopia: Secondary | ICD-10-CM | POA: Diagnosis not present

## 2024-02-29 DIAGNOSIS — Z4509 Encounter for adjustment and management of other cardiac device: Secondary | ICD-10-CM | POA: Insufficient documentation

## 2024-02-29 DIAGNOSIS — Z760 Encounter for issue of repeat prescription: Secondary | ICD-10-CM | POA: Insufficient documentation

## 2024-02-29 DIAGNOSIS — Z79899 Other long term (current) drug therapy: Secondary | ICD-10-CM | POA: Insufficient documentation

## 2024-02-29 LAB — BASIC METABOLIC PANEL WITH GFR
Anion gap: 10 (ref 5–15)
BUN: 12 mg/dL (ref 6–20)
CO2: 24 mmol/L (ref 22–32)
Calcium: 9.1 mg/dL (ref 8.9–10.3)
Chloride: 103 mmol/L (ref 98–111)
Creatinine, Ser: 1.16 mg/dL (ref 0.61–1.24)
GFR, Estimated: >60 mL/min (ref >60)
Glucose, Bld: 122 mg/dL — ABNORMAL HIGH (ref 70–99)
Potassium: 3.8 mmol/L (ref 3.5–5.1)
Sodium: 137 mmol/L (ref 135–145)

## 2024-02-29 LAB — CBC
HCT: 37.2 % — ABNORMAL LOW (ref 39.0–52.0)
Hemoglobin: 11.4 g/dL — ABNORMAL LOW (ref 13.0–17.0)
MCH: 25.4 pg — ABNORMAL LOW (ref 26.0–34.0)
MCHC: 30.6 g/dL (ref 30.0–36.0)
MCV: 82.9 fL (ref 80.0–100.0)
Platelets: 252 K/uL (ref 150–400)
RBC: 4.49 MIL/uL (ref 4.22–5.81)
RDW: 18.6 % — ABNORMAL HIGH (ref 11.5–15.5)
WBC: 7 K/uL (ref 4.0–10.5)
nRBC: 0 % (ref 0.0–0.2)

## 2024-02-29 LAB — DIGOXIN LEVEL: Digoxin Level: 1.1 ng/mL (ref 0.8–2.0)

## 2024-02-29 LAB — PROTIME-INR
INR: 2.4 — ABNORMAL HIGH (ref 0.8–1.2)
Prothrombin Time: 27.5 s — ABNORMAL HIGH (ref 11.4–15.2)

## 2024-02-29 LAB — LACTATE DEHYDROGENASE: LDH: 248 U/L — ABNORMAL HIGH (ref 105–235)

## 2024-02-29 MED ORDER — GABAPENTIN 100 MG PO CAPS
400.0000 mg | ORAL_CAPSULE | Freq: Every day | ORAL | 5 refills | Status: AC
  Filled 2024-02-29 – 2024-03-31 (×2): qty 60, 15d supply, fill #0
  Filled 2024-04-05: qty 60, 15d supply, fill #1

## 2024-02-29 MED ORDER — TRAMADOL HCL 50 MG PO TABS
50.0000 mg | ORAL_TABLET | Freq: Three times a day (TID) | ORAL | 1 refills | Status: AC | PRN
  Filled 2024-02-29: qty 30, 5d supply, fill #0
  Filled 2024-04-05: qty 30, 5d supply, fill #1

## 2024-02-29 MED ORDER — TRAMADOL HCL 50 MG PO TABS: 50.0000 mg | ORAL_TABLET | Freq: Three times a day (TID) | ORAL | 1 refills | Status: AC | PRN

## 2024-02-29 MED ORDER — SACUBITRIL-VALSARTAN 24-26 MG PO TABS
1.0000 | ORAL_TABLET | Freq: Two times a day (BID) | ORAL | 3 refills | Status: AC
  Filled 2024-02-29 – 2024-03-31 (×3): qty 60, 30d supply, fill #0

## 2024-02-29 NOTE — Patient Instructions (Addendum)
 Stop Amiodarone   Stop Losartan  Start Entresto  24-26 Increase morning dose of Gabapentin  to 400mg  daily  Will call in prescription for Tramadol  Make sure you pick up your Mexiletine  Will scheduled CT head Please get an eye doctor appointment Return in 1 month dressing change Return in 2 months for follow up with Dr. Bensimhon

## 2024-02-29 NOTE — Progress Notes (Addendum)
 Patient presents for 1 month follow up in [facility] today with his son. Reports no problems with VAD equipment or concerns with drive line.  Patient ambulated independently into clinic without issue. Denies shortness of breath, falls or signs of bleeding. Currently enrolled in Cardiac Rehab at [facility].   Reports intermittent double vision that resolves quickly. Dr. Cherrie ordered a CT head and advised pt to establish care with an eye doctor.   Pt reports he ran out of medications for a couple of days. Pt taking all medication as prescribed except Mexiletine. This medication has been refilled and is awaiting pick up at pharmacy. All medications reviewed with Dr. Bensimhon. Plan to stop Amiodarone  and Losartan  and start Entresto  24-26.  Pt reports he continues to have surgical chest pain that is unrelieved by current regimen. Dr. Bensimhon ordered for pt to increase morning dose of Gabapentin  to 400mg  and continue evening dose of 600mg  daily. Tramadol  also refilled for breakthrough pain. Refill called into pharmacy.    Vital Signs:  Doppler Pressure: 134 Automatic BP: 134/119 (125) repeat: 121/92 (100) HR: 81 SPO2: 96 %   Weight: 338 lb w/ eqt Previous weight: 326.2 w/o lb Home weight: 318  BMI today 43 today   VAD Indication: Destination Therapy due to smoking   LVAD assessment: HM III: Speed: 5700 rpms Flow: 4.5 Power: 4.6 w    PI: 5.8 Alarms: no clinical alarms  Events:0-20  Fixed speed: 5700 Low speed limit: 5400  Primary controller: back up battery due for replacement in  months Secondary controller:  back up battery due for replacement in   months  I reviewed the LVAD parameters from today and compared the results to the patient's prior recorded data. LVAD interrogation was NEGATIVE for significant power changes, NEGATIVE for clinical alarms and STABLE for PI events/speed drops. No programming changes were made and pump is functioning within specified parameters. Pt is  performing daily controller and system monitor self tests along with completing weekly and monthly maintenance for LVAD equipment.   LVAD equipment check completed and is in good working order. Back-up equipment present. Charged back up battery and performed self-test on equipment.    Annual Equipment Maintenance on UBC/PM was performed on 01/11/2024.   Exit Site Care:  VAD dressing and anchor removed and site care performed using sterile technique. Drive line exit site cleaned with Chlora prep applicators x 2, allowed to dry, skin protectant applied and allowed to dry before Sorbaview/gauze dressing and biopatch/Aquacel silver strip re-applied. Exit site remains unincorporated The velour is fully implanted at exit site. No redness, tenderness, or foul odor noted. Moderate amount of serosanguineous drainage on previous dressing resembles tissue fat necrosis. Drive line anchor re-applied. Pt denies fever or chills. Driveline dressing is being twice weekly per sterile technique by pt's daughter. Pt denies fever or chills. Pt states they have adequate dressing supplies at home.        Significant Events on VAD Support:     Device: N/A    BP & Labs:  MAP 134 - Doppler is reflecting modified systolic   Hgb 11.4 - No S/S of bleeding. Specifically denies melena/BRBPR or nosebleeds.   LDH stable at 248 with established baseline of 181- 293. Denies tea-colored urine. No power elevations noted on interrogation.   Plan: Stop Amiodarone   Stop Losartan  Start Entresto  24-26 Increase morning dose of Gabapentin  to 400mg  daily  Will call in refill for Tramadol  Make sure you pick up your Mexiletine  Will scheduled  CT head Please get an eye doctor appointment Return in 1 month dressing change Return in 2 months for follow up with Dr. Cherrie Schuyler Lunger RN, BSN VAD Coordinator 24/7 Pager [phone]

## 2024-03-01 ENCOUNTER — Other Ambulatory Visit: Payer: Self-pay

## 2024-03-04 ENCOUNTER — Encounter

## 2024-03-04 ENCOUNTER — Other Ambulatory Visit (HOSPITAL_COMMUNITY): Payer: Self-pay

## 2024-03-04 DIAGNOSIS — Z95811 Presence of heart assist device: Secondary | ICD-10-CM

## 2024-03-04 DIAGNOSIS — Z7901 Long term (current) use of anticoagulants: Secondary | ICD-10-CM

## 2024-03-06 ENCOUNTER — Encounter

## 2024-03-11 ENCOUNTER — Encounter

## 2024-03-12 ENCOUNTER — Telehealth (HOSPITAL_COMMUNITY): Payer: Self-pay | Admitting: Pharmacist

## 2024-03-12 NOTE — Telephone Encounter (Signed)
 Called patient as a reminder to have INR check at [facility] due to amiodarone  changes, but there was no response. LVM.

## 2024-03-13 ENCOUNTER — Encounter

## 2024-03-13 ENCOUNTER — Other Ambulatory Visit: Payer: Self-pay

## 2024-03-18 ENCOUNTER — Other Ambulatory Visit
Admission: RE | Admit: 2024-03-18 | Discharge: 2024-03-18 | Disposition: A | Discharge status: Home / Self Care | Attending: Internal Medicine | Admitting: Internal Medicine

## 2024-03-18 ENCOUNTER — Telehealth (HOSPITAL_COMMUNITY): Payer: Self-pay

## 2024-03-18 ENCOUNTER — Other Ambulatory Visit (HOSPITAL_COMMUNITY): Payer: Self-pay

## 2024-03-18 ENCOUNTER — Encounter

## 2024-03-18 ENCOUNTER — Ambulatory Visit (HOSPITAL_COMMUNITY): Payer: Self-pay | Admitting: Pharmacist

## 2024-03-18 DIAGNOSIS — Z7901 Long term (current) use of anticoagulants: Secondary | ICD-10-CM | POA: Insufficient documentation

## 2024-03-18 DIAGNOSIS — Z95811 Presence of heart assist device: Secondary | ICD-10-CM | POA: Insufficient documentation

## 2024-03-18 DIAGNOSIS — H532 Diplopia: Secondary | ICD-10-CM

## 2024-03-18 LAB — PROTIME-INR
INR: 2.4 — ABNORMAL HIGH (ref 0.8–1.2)
Prothrombin Time: 27.1 s — ABNORMAL HIGH (ref 11.4–15.2)

## 2024-03-18 NOTE — Telephone Encounter (Signed)
-----   Message from Nurse Schuyler HERO, RN sent at 03/11/2024  3:35 PM EST ----- Okay thank you! ----- Message ----- From: Alvan Fausto SAUNDERS, CMA Sent: 03/11/2024   3:28 PM EST To: Schuyler JAYSON Lunger, RN  Hey, yes he does, he's in clinical review. ----- Message ----- From: Lunger Schuyler JAYSON, RN Sent: 03/11/2024   2:34 PM EST To: Fausto SAUNDERS Alvan, CMA  Hey Ketih Goodie,  I just wanted to follow to see if [name] [place] needed a PA for a CT head? ----- Message ----- From: Lunger Schuyler JAYSON, RN Sent: 02/29/2024   4:28 PM EST To: Isaiah KATHEE Knoll, RN; Lauraine KATHEE Ip, RN; #  Hey Anterio Scheel,  Can you please check to see if a PA is need for a CT head.  Thanks,  OIivia

## 2024-03-18 NOTE — Telephone Encounter (Signed)
 Auth#[id number] valid until 04/10/24

## 2024-03-18 NOTE — Addendum Note (Signed)
 Addended by: Rhandi Despain C on: 03/18/2024 10:51 AM   Modules accepted: Orders

## 2024-03-20 ENCOUNTER — Telehealth (HOSPITAL_COMMUNITY): Payer: Self-pay

## 2024-03-20 ENCOUNTER — Encounter

## 2024-03-20 NOTE — Telephone Encounter (Signed)
 STAT CT head ordered last visit. Prior authorization approved 03/19/23. Pt requesting number for scheduling due current transportation barriers with post op driving restrictions. Advised to call [facility] Clinic if he experiences any issues scheduling. Pt has not made optometry visit. Advised to schedule eye exam due to ongoing double vision.   Schuyler Lunger RN, BSN VAD Coordinator 24/7 Pager [phone]

## 2024-03-21 ENCOUNTER — Other Ambulatory Visit: Payer: Self-pay

## 2024-03-21 ENCOUNTER — Ambulatory Visit
Admission: RE | Admit: 2024-03-21 | Discharge: 2024-03-21 | Disposition: A | Discharge status: Home / Self Care | Source: Ambulatory Visit | Attending: Internal Medicine | Admitting: Internal Medicine

## 2024-03-21 DIAGNOSIS — H532 Diplopia: Secondary | ICD-10-CM | POA: Diagnosis present

## 2024-03-25 ENCOUNTER — Encounter

## 2024-03-27 ENCOUNTER — Encounter

## 2024-03-27 DIAGNOSIS — Z95811 Presence of heart assist device: Secondary | ICD-10-CM

## 2024-03-27 DIAGNOSIS — I5042 Chronic combined systolic (congestive) and diastolic (congestive) heart failure: Secondary | ICD-10-CM

## 2024-03-27 NOTE — Progress Notes (Addendum)
 Cardiac Individual Treatment Plan  Patient Details  Name: Philip Monroe MRN: [id number] Date of Birth: Sep 23, 1975 Referring Provider:   Flowsheet Row Cardiac Rehab from 02/14/2024 in [facility] Cardiac and Pulmonary Rehab  Referring Provider Dr. Cherrie    Initial Encounter Date:  Flowsheet Row Cardiac Rehab from 02/14/2024 in [facility] Cardiac and Pulmonary Rehab  Date 02/14/24    Visit Diagnosis: LVAD (left ventricular assist device) present [facility])  Chronic combined systolic and diastolic congestive heart failure (HCC)  Patient's Home Medications on Admission: Current Medications[1]  Past Medical History: Past Medical History:  Diagnosis Date   Aortic root dilatation    a. 07/2022 Echo: Ao root 4.4cm, Asc Ao 4.0cm.   Asthma    as a child, no problems as an adult, rarely uses inhaler   CAD (coronary artery disease)    a. 08/2017 Cath: LM nl, LAD 32m/d, D1 70ost, LCX 152m, OM1 90 inf branch, 60 sup branch, OM2 small, RCA 30 diffuse, RPDA 100 w/ L->R collats; b. 07/2022 Echo: LM nl, LAD 13m, D1 80, LCX 180m, OM1 80, RCA 1m, RPDA 100-->No targets for PCI/CABG-->Med rx.   CHF (congestive heart failure) (HCC)    takes lasix    Chronic HFrEF (heart failure with reduced ejection fraction) (HCC)    a. 03/2015 Echo: EF 35-40%; b. 08/2017 Echo: EF 15%; c. 08/2017 cMRI: EF 35%; d. 06/2018 Echo: EF 10-15%; e. 07/2022 Echo: EF 15-20%, grIII DD, mild MR, sev dil LA, mod-sev PH, Ao root 4.4cm, Asc Ao 4.0cm; f. 07/2022 RHC: RA 4, RV 38/3, PA 43/18 (26), PCWP 11. CO/CI 5.3/1.8.   CKD (chronic kidney disease), stage II    Diabetes mellitus without complication (HCC)    type 2   Dyspnea    with exertion   Gout    Hyperlipidemia LDL goal <70    Hypertension    Ischemic cardiomyopathy    a. 03/2015 Echo: EF 35-40%; b. 08/2017 Echo: EF 15%; c. 08/2017 cMRI: EF 35%; d. 06/2018 Echo: EF 10-15%; e. 07/2022 Echo: EF 15-20%, grIII DD.   IVCD (intraventricular conduction defect)    Left ventricular noncompaction [facility])     Morbid obesity (HCC)    NSVT (nonsustained ventricular tachycardia) (HCC)    a. 09/2022 Event monitor: predominantly sinus rhythm, avg HR 81 (46-167). 17 runs NSVT (fastest/longest 13 beats x 167). Rare PACs, 17.5% PVC burden.   PVC's (premature ventricular contractions)    a. 09/2022 Event Monitor: 17.5% PVC burden.   Statin intolerance    Tobacco abuse     Tobacco Use: Tobacco Use History[2]  Labs: Review Flowsheet  More data exists      Latest Ref Rng & Units 01/20/2024 01/21/2024 01/22/2024 01/23/2024 01/24/2024  Labs for ITP Cardiac and Pulmonary Rehab  O2 Saturation % 62.9  63.3  67.4  58.7  67.2      Exercise Target Goals: Exercise Program Goal: Individual exercise prescription set using results from initial 6 min walk test and THRR while considering  patients activity barriers and safety.   Exercise Prescription Goal: Initial exercise prescription builds to 30-45 minutes a day of aerobic activity, 2-3 days per week.  Home exercise guidelines will be given to patient during program as part of exercise prescription that the participant will acknowledge.   Education: Aerobic Exercise: - Group verbal and visual presentation on the components of exercise prescription. Introduces F.I.T.T principle from ACSM for exercise prescriptions.  Reviews F.I.T.T. principles of aerobic exercise including progression. Written material provided at class time. Flowsheet  Row Cardiac Rehab from 02/14/2024 in [facility] Cardiac and Pulmonary Rehab  Education need identified 02/14/24    Education: Resistance Exercise: - Group verbal and visual presentation on the components of exercise prescription. Introduces F.I.T.T principle from ACSM for exercise prescriptions  Reviews F.I.T.T. principles of resistance exercise including progression. Written material provided at class time.    Education: Exercise & Equipment Safety: - Individual verbal instruction and demonstration of equipment use and safety with  use of the equipment. Flowsheet Row Cardiac Rehab from 02/14/2024 in [facility] Cardiac and Pulmonary Rehab  Date 02/14/24  Educator [facility]  Instruction Review Code 1- Verbalizes Understanding    Education: Exercise Physiology & General Exercise Guidelines: - Group verbal and written instruction with models to review the exercise physiology of the cardiovascular system and associated critical values. Provides general exercise guidelines with specific guidelines to those with heart or lung disease. Written material provided at class time. Flowsheet Row Cardiac Rehab from 02/14/2024 in [facility] Cardiac and Pulmonary Rehab  Education need identified 02/14/24    Education: Flexibility, Balance, Mind/Body Relaxation: - Group verbal and visual presentation with interactive activity on the components of exercise prescription. Introduces F.I.T.T principle from ACSM for exercise prescriptions. Reviews F.I.T.T. principles of flexibility and balance exercise training including progression. Also discusses the mind body connection.  Reviews various relaxation techniques to help reduce and manage stress (i.e. Deep breathing, progressive muscle relaxation, and visualization). Balance handout provided to take home. Written material provided at class time.   Activity Barriers & Risk Stratification:  Activity Barriers & Cardiac Risk Stratification - 02/14/24 1305       Activity Barriers & Cardiac Risk Stratification   Activity Barriers Decreased Ventricular Function;Muscular Weakness    Cardiac Risk Stratification High          6 Minute Walk:  6 Minute Walk     Row Name 02/14/24 1304         6 Minute Walk   Phase Initial     Distance 950 feet     Walk Time 6 minutes     # of Rest Breaks 0     MPH 1.8     METS 2.42     RPE 13     Perceived Dyspnea  0     VO2 Peak 8.49     Symptoms No     Resting HR 97 bpm     Resting BP --  Doppler 82     Resting Oxygen Saturation  95 %     Exercise Oxygen Saturation   during 6 min walk 96 %     Max Ex. HR 117 bpm     Max Ex. BP --  Doppler 92     2 Minute Post BP --  Doppler 88        Oxygen Initial Assessment:   Oxygen Re-Evaluation:   Oxygen Discharge (Final Oxygen Re-Evaluation):   Initial Exercise Prescription:  Initial Exercise Prescription - 02/14/24 1300       Date of Initial Exercise RX and Referring Provider   Date 02/14/24    Referring Provider Dr. Bensimhon      Oxygen   Maintain Oxygen Saturation 88% or higher      NuStep   Level 2    SPM 80    Minutes 15    METs 2.42      REL-XR   Level 1    Speed 50    Minutes 15    METs 2.42  Track   Laps 20    Minutes 15    METs 2.09      Prescription Details   Frequency (times per week) 2    Duration Progress to 30 minutes of continuous aerobic without signs/symptoms of physical distress      Intensity   THRR 40-80% of Max Heartrate 127-157    Ratings of Perceived Exertion 11-13    Perceived Dyspnea 0-4      Progression   Progression Continue to progress workloads to maintain intensity without signs/symptoms of physical distress.      Resistance Training   Training Prescription Yes    Weight 2    Reps 10-15          Perform Capillary Blood Glucose checks as needed.  Exercise Prescription Changes:   Exercise Prescription Changes     Row Name 02/14/24 1300             Response to Exercise   Blood Pressure (Admit) --  Doppler 82       Blood Pressure (Exercise) --  Doppler 92       Blood Pressure (Exit) --  Doppler 88       Heart Rate (Admit) 97 bpm       Heart Rate (Exercise) 117 bpm       Heart Rate (Exit) 96 bpm       Oxygen Saturation (Admit) 95 %       Oxygen Saturation (Exercise) 96 %       Oxygen Saturation (Exit) 96 %       Rating of Perceived Exertion (Exercise) 13       Perceived Dyspnea (Exercise) 0       Symptoms none       Comments 6 MWT results          Exercise Comments:   Exercise Goals and Review:   Exercise Goals      Row Name 02/14/24 1312             Exercise Goals   Increase Physical Activity Yes       Intervention Provide advice, education, support and counseling about physical activity/exercise needs.;Develop an individualized exercise prescription for aerobic and resistive training based on initial evaluation findings, risk stratification, comorbidities and participant's personal goals.       Expected Outcomes Short Term: Attend rehab on a regular basis to increase amount of physical activity.;Long Term: Add in home exercise to make exercise part of routine and to increase amount of physical activity.;Long Term: Exercising regularly at least 3-5 days a week.       Increase Strength and Stamina Yes       Intervention Provide advice, education, support and counseling about physical activity/exercise needs.;Develop an individualized exercise prescription for aerobic and resistive training based on initial evaluation findings, risk stratification, comorbidities and participant's personal goals.       Expected Outcomes Short Term: Increase workloads from initial exercise prescription for resistance, speed, and METs.;Short Term: Perform resistance training exercises routinely during rehab and add in resistance training at home;Long Term: Improve cardiorespiratory fitness, muscular endurance and strength as measured by increased METs and functional capacity ( )       Able to understand and use rate of perceived exertion (RPE) scale Yes       Intervention Provide education and explanation on how to use RPE scale       Expected Outcomes Short Term: Able to use RPE daily in rehab to express subjective intensity  level;Long Term:  Able to use RPE to guide intensity level when exercising independently       Able to understand and use Dyspnea scale Yes       Intervention Provide education and explanation on how to use Dyspnea scale       Expected Outcomes Short Term: Able to use Dyspnea scale daily in rehab to  express subjective sense of shortness of breath during exertion;Long Term: Able to use Dyspnea scale to guide intensity level when exercising independently       Knowledge and understanding of Target Heart Rate Range (THRR) Yes       Intervention Provide education and explanation of THRR including how the numbers were predicted and where they are located for reference       Expected Outcomes Short Term: Able to state/look up THRR;Long Term: Able to use THRR to govern intensity when exercising independently;Short Term: Able to use daily as guideline for intensity in rehab       Able to check pulse independently Yes       Intervention Provide education and demonstration on how to check pulse in carotid and radial arteries.;Review the importance of being able to check your own pulse for safety during independent exercise       Expected Outcomes Short Term: Able to explain why pulse checking is important during independent exercise;Long Term: Able to check pulse independently and accurately       Understanding of Exercise Prescription Yes       Intervention Provide education, explanation, and written materials on patient's individual exercise prescription       Expected Outcomes Short Term: Able to explain program exercise prescription;Long Term: Able to explain home exercise prescription to exercise independently          Exercise Goals Re-Evaluation :   Discharge Exercise Prescription (Final Exercise Prescription Changes):  Exercise Prescription Changes - 02/14/24 1300       Response to Exercise   Blood Pressure (Admit) --   Doppler 82   Blood Pressure (Exercise) --   Doppler 92   Blood Pressure (Exit) --   Doppler 88   Heart Rate (Admit) 97 bpm    Heart Rate (Exercise) 117 bpm    Heart Rate (Exit) 96 bpm    Oxygen Saturation (Admit) 95 %    Oxygen Saturation (Exercise) 96 %    Oxygen Saturation (Exit) 96 %    Rating of Perceived Exertion (Exercise) 13    Perceived Dyspnea (Exercise) 0     Symptoms none    Comments 6 MWT results          Nutrition:  Target Goals: Understanding of nutrition guidelines, daily intake of sodium 1500mg , cholesterol 200mg , calories 30% from fat and 7% or less from saturated fats, daily to have 5 or more servings of fruits and vegetables.  Education: Nutrition 1 -Group instruction provided by verbal, written material, interactive activities, discussions, models, and posters to present general guidelines for heart healthy nutrition including macronutrients, label reading, and promoting whole foods over processed counterparts. Education serves as pensions consultant of discussion of heart healthy eating for all. Written material provided at class time.    Education: Nutrition 2 -Group instruction provided by verbal, written material, interactive activities, discussions, models, and posters to present general guidelines for heart healthy nutrition including sodium, cholesterol, and saturated fat. Providing guidance of habit forming to improve blood pressure, cholesterol, and body weight. Written material provided at class time.     Biometrics:  Pre Biometrics - 02/14/24 1313       Pre Biometrics   Height 6' 2 (1.88 m)    Weight 329 lb 12.8 oz (149.6 kg)    Waist Circumference 52 inches    Hip Circumference 53 inches    Waist to Hip Ratio 0.98 %    BMI (Calculated) 42.33    Single Leg Stand 4.57 seconds           Nutrition Therapy Plan and Nutrition Goals:  Nutrition Therapy & Goals - 02/14/24 1318       Intervention Plan   Intervention Prescribe, educate and counsel regarding individualized specific dietary modifications aiming towards targeted core components such as weight, hypertension, lipid management, diabetes, heart failure and other comorbidities.    Expected Outcomes Short Term Goal: Understand basic principles of dietary content, such as calories, fat, sodium, cholesterol and nutrients.;Short Term Goal: A plan has been  developed with personal nutrition goals set during dietitian appointment.;Long Term Goal: Adherence to prescribed nutrition plan.          Nutrition Assessments:  MEDIFICTS Score Key: >=70 Need to make dietary changes  40-70 Heart Healthy Diet <= 40 Therapeutic Level Cholesterol Diet  Flowsheet Row Cardiac Rehab from 02/14/2024 in [facility] Cardiac and Pulmonary Rehab  Picture Your Plate Total Score on Admission 63   Picture Your Plate Scores: <59 Unhealthy dietary pattern with much room for improvement. 41-50 Dietary pattern unlikely to meet recommendations for good health and room for improvement. 51-60 More healthful dietary pattern, with some room for improvement.  >60 Healthy dietary pattern, although there may be some specific behaviors that could be improved.    Nutrition Goals Re-Evaluation:   Nutrition Goals Discharge (Final Nutrition Goals Re-Evaluation):   Psychosocial: Target Goals: Acknowledge presence or absence of significant depression and/or stress, maximize coping skills, provide positive support system. Participant is able to verbalize types and ability to use techniques and skills needed for reducing stress and depression.   Education: Stress, Anxiety, and Depression - Group verbal and visual presentation to define topics covered.  Reviews how body is impacted by stress, anxiety, and depression.  Also discusses healthy ways to reduce stress and to treat/manage anxiety and depression. Written material provided at class time.   Education: Sleep Hygiene -Provides group verbal and written instruction about how sleep can affect your health.  Define sleep hygiene, discuss sleep cycles and impact of sleep habits. Review good sleep hygiene tips.   Initial Review & Psychosocial Screening:  Initial Psych Review & Screening - 02/07/24 1013       Initial Review   Current issues with Current Stress Concerns;Current Sleep Concerns    Source of Stress Concerns Chronic  Illness;Unable to perform yard/household activities;Unable to participate in former interests or hobbies      Family Dynamics   Good Support System? Yes   family     Barriers   Psychosocial barriers to participate in program There are no identifiable barriers or psychosocial needs.;The patient should benefit from training in stress management and relaxation.      Screening Interventions   Interventions Encouraged to exercise;Provide feedback about the scores to participant;To provide support and resources with identified psychosocial needs    Expected Outcomes Long Term Goal: Stressors or current issues are controlled or eliminated.;Short Term goal: Utilizing psychosocial counselor, staff and physician to assist with identification of specific Stressors or current issues interfering with healing process. Setting desired goal for each stressor or current issue identified.;Short Term goal: Identification  and review with participant of any Quality of Life or Depression concerns found by scoring the questionnaire.;Long Term goal: The participant improves quality of Life and PHQ9 Scores as seen by post scores and/or verbalization of changes          Quality of Life Scores:   Quality of Life - 02/14/24 1318       Quality of Life   Select Quality of Life      Quality of Life Scores   Health/Function Pre 17.68 %    Socioeconomic Pre 16.93 %    Psych/Spiritual Pre 21.43 %    Family Pre 23 %    GLOBAL Pre 19 %         Scores of 19 and below usually indicate a poorer quality of life in these areas.  A difference of  2-3 points is a clinically meaningful difference.  A difference of 2-3 points in the total score of the Quality of Life Index has been associated with significant improvement in overall quality of life, self-image, physical symptoms, and general health in studies assessing change in quality of life.  PHQ-9: Review Flowsheet       02/14/2024 06/11/2015 05/18/2015 04/20/2015   Depression screen PHQ 2/9  Decreased Interest 0 0 0 0  Down, Depressed, Hopeless 0 0 0 0  PHQ - 2 Score 0 0 0 0  Altered sleeping 1 - - -  Tired, decreased energy 0 - - -  Change in appetite 1 - - -  Feeling bad or failure about yourself  0 - - -  Trouble concentrating 0 - - -  Moving slowly or fidgety/restless 0 - - -  Suicidal thoughts 0 - - -  PHQ-9 Score 2 - - -  Difficult doing work/chores Not difficult at all - - -   Interpretation of Total Score  Total Score Depression Severity:  1-4 = Minimal depression, 5-9 = Mild depression, 10-14 = Moderate depression, 15-19 = Moderately severe depression, 20-27 = Severe depression   Psychosocial Evaluation and Intervention:  Psychosocial Evaluation - 02/07/24 1020       Psychosocial Evaluation & Interventions   Comments Mr. Stawicki is coming to cardiac rehab post LVAD. He states there has been some stress revolving around getting used to his LVAD and figuring out what his new normal is. His sleep has also been altered because he has a hard time getting comfortable, wakes up sweaty and in pain sometimes. He mentions he has a good support system. He notes he is aware this will all take time to get used to and he is ready to get involved in the program to learn more about heart healthy living.    Expected Outcomes Short: attend cardiac rehab for education and exercise Long: develop and maintain positive self care habits    Continue Psychosocial Services  Follow up required by staff          Psychosocial Re-Evaluation:   Psychosocial Discharge (Final Psychosocial Re-Evaluation):   Vocational Rehabilitation: Provide vocational rehab assistance to qualifying candidates.   Vocational Rehab Evaluation & Intervention:  Vocational Rehab - 02/07/24 1013       Initial Vocational Rehab Evaluation & Intervention   Assessment shows need for Vocational Rehabilitation No          Education: Education Goals: Education classes will be  provided on a variety of topics geared toward better understanding of heart health and risk factor modification. Participant will state understanding/return demonstration of topics presented as  noted by education test scores.  Learning Barriers/Preferences:  Learning Barriers/Preferences - 02/07/24 1013       Learning Barriers/Preferences   Learning Barriers None    Learning Preferences None          General Cardiac Education Topics:  AED/CPR: - Group verbal and written instruction with the use of models to demonstrate the basic use of the AED with the basic ABC's of resuscitation.   Test and Procedures: - Group verbal and visual presentation and models provide information about basic cardiac anatomy and function. Reviews the testing methods done to diagnose heart disease and the outcomes of the test results. Describes the treatment choices: Medical Management, Angioplasty, or Coronary Bypass Surgery for treating various heart conditions including Myocardial Infarction, Angina, Valve Disease, and Cardiac Arrhythmias. Written material provided at class time. Flowsheet Row Cardiac Rehab from 02/14/2024 in [facility] Cardiac and Pulmonary Rehab  Education need identified 02/14/24    Medication Safety: - Group verbal and visual instruction to review commonly prescribed medications for heart and lung disease. Reviews the medication, class of the drug, and side effects. Includes the steps to properly store meds and maintain the prescription regimen. Written material provided at class time.   Intimacy: - Group verbal instruction through game format to discuss how heart and lung disease can affect sexual intimacy. Written material provided at class time.   Know Your Numbers and Heart Failure: - Group verbal and visual instruction to discuss disease risk factors for cardiac and pulmonary disease and treatment options.  Reviews associated critical values for Overweight/Obesity, Hypertension,  Cholesterol, and Diabetes.  Discusses basics of heart failure: signs/symptoms and treatments.  Introduces Heart Failure Zone chart for action plan for heart failure. Written material provided at class time.   Infection Prevention: - Provides verbal and written material to individual with discussion of infection control including proper hand washing and proper equipment cleaning during exercise session. Flowsheet Row Cardiac Rehab from 02/14/2024 in [facility] Cardiac and Pulmonary Rehab  Date 02/14/24  Educator [facility]  Instruction Review Code 1- Verbalizes Understanding    Falls Prevention: - Provides verbal and written material to individual with discussion of falls prevention and safety. Flowsheet Row Cardiac Rehab from 02/14/2024 in [facility] Cardiac and Pulmonary Rehab  Date 02/14/24  Educator [facility]  Instruction Review Code 1- Verbalizes Understanding    Other: -Provides group and verbal instruction on various topics (see comments)   Knowledge Questionnaire Score:  Knowledge Questionnaire Score - 02/14/24 1315       Knowledge Questionnaire Score   Pre Score 23/26          Core Components/Risk Factors/Patient Goals at Admission:  Personal Goals and Risk Factors at Admission - 02/14/24 1316       Core Components/Risk Factors/Patient Goals on Admission    Weight Management Yes;Weight Loss    Intervention Weight Management: Develop a combined nutrition and exercise program designed to reach desired caloric intake, while maintaining appropriate intake of nutrient and fiber, sodium and fats, and appropriate energy expenditure required for the weight goal.;Weight Management: Provide education and appropriate resources to help participant work on and attain dietary goals.;Weight Management/Obesity: Establish reasonable short term and long term weight goals.;Obesity: Provide education and appropriate resources to help participant work on and attain dietary goals.    Admit Weight 329 lb 12.8 oz (149.6  kg)    Goal Weight: Short Term 320 lb (145.2 kg)    Goal Weight: Long Term 250 lb (113.4 kg)    Expected Outcomes  Short Term: Continue to assess and modify interventions until short term weight is achieved;Long Term: Adherence to nutrition and physical activity/exercise program aimed toward attainment of established weight goal;Weight Loss: Understanding of general recommendations for a balanced deficit meal plan, which promotes 1-2 lb weight loss per week and includes a negative energy balance of [phone] kcal/d;Understanding recommendations for meals to include 15-35% energy as protein, 25-35% energy from fat, 35-60% energy from carbohydrates, less than 200mg  of dietary cholesterol, 20-35 gm of total fiber daily;Understanding of distribution of calorie intake throughout the day with the consumption of 4-5 meals/snacks    Tobacco Cessation Yes    Number of packs per day 0   recent quit   Intervention Assist the participant in steps to quit. Provide individualized education and counseling about committing to Tobacco Cessation, relapse prevention, and pharmacological support that can be provided by physician.;Education officer, environmental, assist with locating and accessing local/national Quit Smoking programs, and support quit date choice.    Expected Outcomes Short Term: Will demonstrate readiness to quit, by selecting a quit date.;Short Term: Will quit all tobacco product use, adhering to prevention of relapse plan.;Long Term: Complete abstinence from all tobacco products for at least 12 months from quit date.    Diabetes Yes    Intervention Provide education about signs/symptoms and action to take for hypo/hyperglycemia.;Provide education about proper nutrition, including hydration, and aerobic/resistive exercise prescription along with prescribed medications to achieve blood glucose in normal ranges: Fasting glucose 65-99 mg/dL    Expected Outcomes Short Term: Participant verbalizes understanding of  the signs/symptoms and immediate care of hyper/hypoglycemia, proper foot care and importance of medication, aerobic/resistive exercise and nutrition plan for blood glucose control.;Long Term: Attainment of HbA1C < 7%.    Heart Failure Yes    Intervention Provide a combined exercise and nutrition program that is supplemented with education, support and counseling about heart failure. Directed toward relieving symptoms such as shortness of breath, decreased exercise tolerance, and extremity edema.    Expected Outcomes Improve functional capacity of life;Short term: Attendance in program 2-3 days a week with increased exercise capacity. Reported lower sodium intake. Reported increased fruit and vegetable intake. Reports medication compliance.;Short term: Daily weights obtained and reported for increase. Utilizing diuretic protocols set by physician.;Long term: Adoption of self-care skills and reduction of barriers for early signs and symptoms recognition and intervention leading to self-care maintenance.    Hypertension Yes    Intervention Provide education on lifestyle modifcations including regular physical activity/exercise, weight management, moderate sodium restriction and increased consumption of fresh fruit, vegetables, and low fat dairy, alcohol moderation, and smoking cessation.;Monitor prescription use compliance.    Expected Outcomes Short Term: Continued assessment and intervention until BP is < 140/58mm HG in hypertensive participants. < 130/68mm HG in hypertensive participants with diabetes, heart failure or chronic kidney disease.;Long Term: Maintenance of blood pressure at goal levels.    Lipids Yes    Intervention Provide education and support for participant on nutrition & aerobic/resistive exercise along with prescribed medications to achieve LDL 70mg , HDL >40mg .    Expected Outcomes Short Term: Participant states understanding of desired cholesterol values and is compliant with medications  prescribed. Participant is following exercise prescription and nutrition guidelines.;Long Term: Cholesterol controlled with medications as prescribed, with individualized exercise RX and with personalized nutrition plan. Value goals: LDL < 70mg , HDL > 40 mg.          Education:Diabetes - Individual verbal and written instruction to review signs/symptoms of diabetes, desired ranges  of glucose level fasting, after meals and with exercise. Acknowledge that pre and post exercise glucose checks will be done for 3 sessions at entry of program. Flowsheet Row Cardiac Rehab from 02/14/2024 in [facility] Card[facility] Pulmonary [facility]  Date 02/14/24  Educator [facility]  Instruction Review Code 1- Verbalizes Understanding    Core Components/Risk Factors/Patient Goals Review:   Goals and Risk Factor Review     Row Name 02/14/24 1303             Core Components/Risk Factors/Patient Goals Review   Personal Goals Review Tobacco Cessation       Review Samay has recently quit tobacco use within the last 6 months. Intervention for relapse prevention was provided at the initial medical review. He was encouraged to continue to with tobacco cessation and was provided information on relapse prevention. Patient received information about combination therapy, tobacco cessation classes, quit line, and quit smoking apps in case of a relapse. Patient demonstrated understanding of this material.Staff will continue to provide encouragement and follow up with the patient throughout the program.          Core Components/Risk Factors/Patient Goals at Discharge (Final Review):   Goals and Risk Factor Review - 02/14/24 1303       Core Components/Risk Factors/Patient Goals Review   Personal Goals Review Tobacco Cessation    Review Salif has recently quit tobacco use within the last 6 months. Intervention for relapse prevention was provided at the initial medical review. He was encouraged to continue to with tobacco cessation and  was provided information on relapse prevention. Patient received information about combination therapy, tobacco cessation classes, quit line, and quit smoking apps in case of a relapse. Patient demonstrated understanding of this material.Staff will continue to provide encouragement and follow up with the patient throughout the program.          ITP Comments:  ITP Comments     Row Name 02/07/24 1024 02/14/24 1302 02/28/24 1306 03/27/24 0813     ITP Comments Initial phone call completed. Diagnosis can be found in [facility] 11/19. EP Orientation scheduled for Wednesday 12/3 at 10:30am. Completed and gym orientation for cardiac rehab. Initial ITP created and sent for review to Dr. Oneil Pinal, Medical Director. Treshon has recently quit tobacco use within the last 6 months. Intervention for relapse prevention was provided at the initial medical review. He was encouraged to continue to with tobacco cessation and was provided information on relapse prevention. Patient received information about combination therapy, tobacco cessation classes, quit line, and quit smoking apps in case of a relapse. Patient demonstrated understanding of this material.Staff will continue to provide encouragement and follow up with the patient throughout the program. 30 Day review completed. Medical Director ITP review done, changes made as directed, and signed approval by Medical Director. New to program 30 Day review completed. Medical Director ITP review done, changes made as directed, and signed approval by Medical Director. Patient has been out since last review.       Comments: 30 day review     [1]  Current Outpatient Medications:    Accu-Chek Softclix Lancets lancets, Use up to four times daily as directed. 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 (Patient not taking: Reported on 02/29/2024), Disp: 100 each, Rfl: 0   albuterol  (VENTOLIN  HFA) 108 (90 Base) MCG/ACT inhaler, Inhale 1-2 puffs into the lungs every 6 (six) hours as  needed for wheezing or shortness of breath., Disp: , Rfl:    allopurinol  (ZYLOPRIM ) 100 MG  tablet, Take 1 tablet (100 mg total) by mouth daily., Disp: 90 tablet, Rfl: 3   Blood Glucose Monitoring Suppl (BLOOD GLUCOSE MONITOR SYSTEM) w/Device KIT, Use up to four times daily as directed. 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 (Patient not taking: Reported on 02/29/2024), Disp: 1 kit, Rfl: 0   colchicine  0.6 MG tablet, Take 1 tablet (0.6 mg total) by mouth daily., Disp: 30 tablet, Rfl: 5   digoxin  (LANOXIN ) 0.125 MG tablet, Take 1 tablet (0.125 mg total) by mouth daily., Disp: 90 tablet, Rfl: 3   empagliflozin  (JARDIANCE ) 10 MG TABS tablet, Take 1 tablet (10 mg total) by mouth daily., Disp: 90 tablet, Rfl: 3   ezetimibe  (ZETIA ) 10 MG tablet, Take 1 tablet (10 mg total) by mouth at bedtime., Disp: 90 tablet, Rfl: 3   gabapentin  (NEURONTIN ) 100 MG capsule, Take 4 capsules (400 mg total) by mouth daily before breakfast., Disp: 60 capsule, Rfl: 5   gabapentin  (NEURONTIN ) 300 MG capsule, Take 2 capsules (600 mg total) by mouth daily at 8 pm., Disp: 60 capsule, Rfl: 5   glucose blood (ACCU-CHEK GUIDE TEST) test strip, Use up to four times daily as directed. 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 (Patient not taking: Reported on 02/29/2024), Disp: 100 each, Rfl: 0   Lancet Device MISC, 1 each by Does not apply route as directed. Dispense based on patient and insurance preference. Use up to four times daily as directed. 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 (Patient not taking: Reported on 02/29/2024), Disp: 1 each, Rfl: 0   metFORMIN  (GLUCOPHAGE ) 500 MG tablet, Take 1 tablet (500 mg total) by mouth daily with breakfast., Disp: 90 tablet, Rfl: 3   mexiletine (MEXITIL ) 150 MG capsule, Take 2 capsules (300 mg total) by mouth 2 (two) times daily. (Patient not taking: Reported on 02/29/2024), Disp: 360 capsule, Rfl: 3   pantoprazole  (PROTONIX ) 40 MG tablet, Take 1 tablet (40 mg total) by mouth daily., Disp: 90 tablet, Rfl: 3   sacubitril -valsartan   (ENTRESTO ) 24-26 MG, Take 1 tablet by mouth 2 (two) times daily., Disp: 60 tablet, Rfl: 3   spironolactone  (ALDACTONE ) 25 MG tablet, Take 1 tablet (25 mg total) by mouth daily., Disp: 30 tablet, Rfl: 1   torsemide  (DEMADEX ) 20 MG tablet, Take 1 tablet (20 mg total) by mouth every other day for 30 doses., Disp: 45 tablet, Rfl: 3   traMADol  (ULTRAM ) 50 MG tablet, Take 1-2 tablets (50-100 mg total) by mouth every 8 (eight) hours as needed., Disp: 30 tablet, Rfl: 1   traMADol  (ULTRAM ) 50 MG tablet, Take 1-2 tablets (50-100 mg total) by mouth every 8 (eight) hours as needed for post op pain., Disp: 30 tablet, Rfl: 1   warfarin (COUMADIN ) 5 MG tablet, Take 5 mg (1 tablet) daily except take 7.5 mg (1.5 tablets) on Monday, Wednesday and Friday, or as directed by the heart failure clinic, Disp: 108 tablet, Rfl: 3 [2]  Social History Tobacco Use  Smoking Status Former   Current packs/day: 0.00   Average packs/day: 1.0 packs/day   Types: Cigarettes   Quit date: 03/20/2015   Years since quitting: 9.0  Smokeless Tobacco Never  Tobacco Comments   Stopped smoking 11/24/2023

## 2024-03-29 ENCOUNTER — Other Ambulatory Visit (HOSPITAL_COMMUNITY): Payer: Self-pay

## 2024-03-29 DIAGNOSIS — Z7901 Long term (current) use of anticoagulants: Secondary | ICD-10-CM

## 2024-03-29 DIAGNOSIS — Z95811 Presence of heart assist device: Secondary | ICD-10-CM

## 2024-03-31 ENCOUNTER — Other Ambulatory Visit: Payer: Self-pay

## 2024-03-31 NOTE — Progress Notes (Unsigned)
 Patient ID: Philip Monroe                 DOB: 07-25-1975                    MRN: [id number]      HPI: Philip Monroe is a 49 y.o. male patient referred to lipid clinic by Dr. Rolan. PMH is significant for multivessel CAD, s/p LVAD on 01/11/24, chronic HFrEF, ICM, noncompaction CM, HTN, HLD, CKD Stage II, gout, hx ETOH use, noncompliance and morbid obesity   The last and referring visit with Dr. Rolan occurred on 12/08/23. During that visit, the patient states that he has had a history of statin intolerance. Last lipid panel on 12/08/23 showed an LDL-C of 130. Lipo(a) of 426.1 on 11/29/23.   At today's visit, the patient brings in all of his medications for review. He is currently taking ezetimibe  without issue. He does have a history of statin-induced myalgias. Of note, he did have an INR check yesterday, which he missed, but is able to have his labs drawn today while he is in clinic.   Reviewed options for lowering LDL cholesterol, including PCSK-9 inhibitors and bempedoic acid.  Discussed mechanisms of action, dosing, side effects and potential decreases in LDL cholesterol.  Also reviewed cost information and potential options for patient assistance.   Current Medications: Ezetimibe  10 mg daily Intolerances: Simvastatin  20 mg (myalgia), atorvastatin 80 mg daily (myalgia) Risk Factors: HTN, CKD, HFrEF s/p LVAD,  LDL-C goal: <55 mg/dL  Diet: 2-3 meals per day  Breakfast: eggs, turkey, bagel (airfryer) Lunch/Dinner: leftovers Avoids pork and dairy  Exercise: walking as needed, tried to sign up for rehab but has transportation issues, which inhibits him from going   Family History:  Father: CABG  Social History:  Tobacco: former, stopped last year ~11/2023 EtOH: none, stopped last year ~03/2023  Labs: Lipid Panel     Component Value Date/Time   CHOL 190 12/08/2023 1131   TRIG 26 01/12/2024 0500   HDL 39 (L) 12/08/2023 1131   CHOLHDL 4.9 12/08/2023 1131   VLDL 21 12/08/2023  1131   LDLCALC 130 (H) 12/08/2023 1131   Past Medical History:  Diagnosis Date   Aortic root dilatation    a. 07/2022 Echo: Ao root 4.4cm, Asc Ao 4.0cm.   Asthma    as a child, no problems as an adult, rarely uses inhaler   CAD (coronary artery disease)    a. 08/2017 Cath: LM nl, LAD 56m/d, D1 70ost, LCX 113m, OM1 90 inf branch, 60 sup branch, OM2 small, RCA 30 diffuse, RPDA 100 w/ L->R collats; b. 07/2022 Echo: LM nl, LAD 35m, D1 80, LCX 168m, OM1 80, RCA 15m, RPDA 100-->No targets for PCI/CABG-->Med rx.   CHF (congestive heart failure) (HCC)    takes lasix    Chronic HFrEF (heart failure with reduced ejection fraction) (HCC)    a. 03/2015 Echo: EF 35-40%; b. 08/2017 Echo: EF 15%; c. 08/2017 cMRI: EF 35%; d. 06/2018 Echo: EF 10-15%; e. 07/2022 Echo: EF 15-20%, grIII DD, mild MR, sev dil LA, mod-sev PH, Ao root 4.4cm, Asc Ao 4.0cm; f. 07/2022 RHC: RA 4, RV 38/3, PA 43/18 (26), PCWP 11. CO/CI 5.3/1.8.   CKD (chronic kidney disease), stage II    Diabetes mellitus without complication (HCC)    type 2   Dyspnea    with exertion   Gout    Hyperlipidemia LDL goal <70    Hypertension    Ischemic  cardiomyopathy    a. 03/2015 Echo: EF 35-40%; b. 08/2017 Echo: EF 15%; c. 08/2017 cMRI: EF 35%; d. 06/2018 Echo: EF 10-15%; e. 07/2022 Echo: EF 15-20%, grIII DD.   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 09/2022 Event monitor: predominantly sinus rhythm, avg HR 81 (46-167). 17 runs NSVT (fastest/longest 13 beats x 167). Rare PACs, 17.5% PVC burden.   PVC's (premature ventricular contractions)    a. 09/2022 Event Monitor: 17.5% PVC burden.   Statin intolerance    Tobacco abuse    Medications Ordered Prior to Encounter[1]  Allergies[2]  Assessment/Plan:  1. Hyperlipidemia -  Hyperlipidemia LDL goal <70 Assessment:  LDL-C not at goal of <50 mg/dL He is currently on ezetimibe  and  reports adherence. Denies any side effects or concerns at this time.  Discussed PCSK9i as optimal next-line therapy. Counseled on avoiding bempedoic acid due to risk of gout flare.   Plan:  Start Repatha 140 mg once every 14 days Will start PA and notify patient once approval status is known Will send to his preferred pharmacy is the [facility] once approval status is known Continue ezetimibe  10 mg daily   Thank you, Woodie Jock, PharmD PGY1 Pharmacy Resident  04/02/2024   Eleanor JONETTA Crews, Pharm.JONETTA SARAN, CPP [facility] [address], [place], [place] [address]  Phone: [phone]; Fax: [phone]      [1]  Current Outpatient Medications on File Prior to Visit  Medication Sig Dispense Refill   allopurinol  (ZYLOPRIM ) 100 MG tablet Take 1 tablet (100 mg total) by mouth daily. 90 tablet 3   colchicine  0.6 MG tablet Take 1 tablet (0.6 mg total) by mouth daily. 30 tablet 5   digoxin  (LANOXIN ) 0.125 MG tablet Take 1 tablet (0.125 mg total) by mouth daily. 90 tablet 3   empagliflozin  (JARDIANCE ) 10 MG TABS tablet Take 1 tablet (10 mg total) by mouth daily. 90 tablet 3   ezetimibe  (ZETIA ) 10 MG tablet Take 1 tablet (10 mg total) by mouth at bedtime. 90 tablet 3   metFORMIN  (GLUCOPHAGE ) 500 MG tablet Take 1 tablet (500 mg total) by mouth daily with breakfast. 90 tablet 3   mexiletine (MEXITIL ) 150 MG capsule Take 2 capsules (300 mg total) by mouth 2 (two) times daily. 360 capsule 3   pantoprazole  (PROTONIX ) 40 MG tablet Take 1 tablet (40 mg total) by mouth daily. 90 tablet 3   sacubitril -valsartan  (ENTRESTO ) 24-26 MG Take 1 tablet by mouth 2 (two) times daily. 60 tablet 3   spironolactone  (ALDACTONE ) 25 MG tablet Take 1 tablet (25 mg total) by mouth daily. 30 tablet 1   torsemide  (DEMADEX ) 20 MG tablet Take 1 tablet (20 mg total) by mouth every other day for 30 doses. 45 tablet 3   warfarin (COUMADIN ) 5 MG  tablet Take 5 mg (1 tablet) daily except take 7.5 mg (1.5 tablets) on Monday, Wednesday and Friday, or as directed by the heart failure clinic 108 tablet 3   Accu-Chek Softclix Lancets lancets Use up to four times daily as directed. 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 (Patient not taking: Reported on 02/29/2024) 100 each 0   albuterol  (VENTOLIN  HFA) 108 (90 Base) MCG/ACT inhaler Inhale 1-2 puffs into the lungs every 6 (six) hours as needed for wheezing or shortness of breath.     Blood Glucose  Monitoring Suppl (BLOOD GLUCOSE MONITOR SYSTEM) w/Device KIT Use up to four times daily as directed. 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 (Patient not taking: Reported on 02/29/2024) 1 kit 0   gabapentin  (NEURONTIN ) 100 MG capsule Take 4 capsules (400 mg total) by mouth daily before breakfast. 60 capsule 5   gabapentin  (NEURONTIN ) 300 MG capsule Take 2 capsules (600 mg total) by mouth daily at 8 pm. 60 capsule 5   glucose blood (ACCU-CHEK GUIDE TEST) test strip Use up to four times daily as directed. 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 (Patient not taking: Reported on 02/29/2024) 100 each 0   Lancet Device MISC 1 each by Does not apply route as directed. Dispense based on patient and insurance preference. Use up to four times daily as directed. 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 (Patient not taking: Reported on 02/29/2024) 1 each 0   traMADol  (ULTRAM ) 50 MG tablet Take 1-2 tablets (50-100 mg total) by mouth every 8 (eight) hours as needed. 30 tablet 1   traMADol  (ULTRAM ) 50 MG tablet Take 1-2 tablets (50-100 mg total) by mouth every 8 (eight) hours as needed for post op pain. 30 tablet 1   No current facility-administered medications on file prior to visit.  [2]  Allergies Allergen Reactions   Lactose Intolerance (Gi) Diarrhea   Statins     myalgias

## 2024-04-01 ENCOUNTER — Other Ambulatory Visit: Payer: Self-pay

## 2024-04-01 ENCOUNTER — Encounter

## 2024-04-02 ENCOUNTER — Other Ambulatory Visit: Payer: Self-pay | Admitting: Emergency Medicine

## 2024-04-02 ENCOUNTER — Ambulatory Visit: Payer: MEDICAID | Admitting: Pharmacist

## 2024-04-02 DIAGNOSIS — E785 Hyperlipidemia, unspecified: Secondary | ICD-10-CM | POA: Diagnosis not present

## 2024-04-02 DIAGNOSIS — Z79899 Other long term (current) drug therapy: Secondary | ICD-10-CM

## 2024-04-02 DIAGNOSIS — Z789 Other specified health status: Secondary | ICD-10-CM | POA: Insufficient documentation

## 2024-04-02 NOTE — Patient Instructions (Addendum)

## 2024-04-02 NOTE — Assessment & Plan Note (Addendum)
 Assessment:  LDL-C not at goal of <50 mg/dL He is currently on ezetimibe  and reports adherence. Denies any side effects or concerns at this time.  Discussed PCSK9i as optimal next-line therapy. Counseled on avoiding bempedoic acid due to risk of gout flare.   Plan:  Start Repatha 140 mg once every 14 days Will start PA and notify patient once approval status is known Will send to his preferred pharmacy is the [facility] once approval status is known Continue ezetimibe  10 mg daily

## 2024-04-03 ENCOUNTER — Ambulatory Visit (HOSPITAL_COMMUNITY): Payer: Self-pay | Admitting: Pharmacist

## 2024-04-03 ENCOUNTER — Encounter

## 2024-04-03 LAB — PROTIME-INR
INR: 2.8 — ABNORMAL HIGH (ref 0.9–1.2)
Prothrombin Time: 28.4 s — ABNORMAL HIGH (ref 9.1–12.0)

## 2024-04-04 ENCOUNTER — Telehealth (HOSPITAL_COMMUNITY): Payer: Self-pay | Admitting: Unknown Physician Specialty

## 2024-04-04 NOTE — Telephone Encounter (Signed)

## 2024-04-05 ENCOUNTER — Other Ambulatory Visit (HOSPITAL_COMMUNITY): Payer: Self-pay

## 2024-04-05 ENCOUNTER — Other Ambulatory Visit: Payer: Self-pay

## 2024-04-05 ENCOUNTER — Telehealth: Payer: Self-pay | Admitting: Pharmacy Technician

## 2024-04-05 ENCOUNTER — Other Ambulatory Visit (HOSPITAL_COMMUNITY): Payer: Self-pay | Admitting: *Deleted

## 2024-04-05 DIAGNOSIS — Z95811 Presence of heart assist device: Secondary | ICD-10-CM

## 2024-04-05 DIAGNOSIS — I5022 Chronic systolic (congestive) heart failure: Secondary | ICD-10-CM

## 2024-04-05 NOTE — Telephone Encounter (Signed)
 Pharmacy Patient Advocate Encounter   Received notification from cc charts that prior authorization for repatha is required/requested.   Insurance verification completed.   The patient is insured through [facility] MEDICAID.   Per test claim: PA required; PA submitted to above mentioned insurance via Latent Key/confirmation #/EOC [id number] Status is pending

## 2024-04-08 ENCOUNTER — Ambulatory Visit (HOSPITAL_COMMUNITY)

## 2024-04-08 ENCOUNTER — Encounter

## 2024-04-08 ENCOUNTER — Other Ambulatory Visit (HOSPITAL_COMMUNITY): Payer: Self-pay | Admitting: Unknown Physician Specialty

## 2024-04-08 DIAGNOSIS — I5022 Chronic systolic (congestive) heart failure: Secondary | ICD-10-CM

## 2024-04-08 DIAGNOSIS — Z95811 Presence of heart assist device: Secondary | ICD-10-CM

## 2024-04-10 ENCOUNTER — Ambulatory Visit (HOSPITAL_COMMUNITY)
Admission: RE | Admit: 2024-04-10 | Discharge: 2024-04-10 | Disposition: A | Discharge status: Home / Self Care | Source: Ambulatory Visit | Attending: Internal Medicine | Admitting: Internal Medicine

## 2024-04-10 ENCOUNTER — Encounter

## 2024-04-10 ENCOUNTER — Ambulatory Visit (HOSPITAL_COMMUNITY)
Admission: RE | Admit: 2024-04-10 | Discharge: 2024-04-10 | Disposition: A | Source: Ambulatory Visit | Attending: Internal Medicine | Admitting: Internal Medicine

## 2024-04-10 ENCOUNTER — Ambulatory Visit (HOSPITAL_COMMUNITY): Payer: Self-pay | Admitting: Pharmacist

## 2024-04-10 DIAGNOSIS — I5022 Chronic systolic (congestive) heart failure: Secondary | ICD-10-CM

## 2024-04-10 DIAGNOSIS — Z95811 Presence of heart assist device: Secondary | ICD-10-CM

## 2024-04-10 LAB — PROTIME-INR
INR: 2.2 — ABNORMAL HIGH (ref 0.8–1.2)
Prothrombin Time: 25.3 s — ABNORMAL HIGH (ref 11.4–15.2)

## 2024-04-10 NOTE — Progress Notes (Signed)
 Pt presents to clinic today for INR and dressing change. Denies issues with VAD equipment or drive line.   Brought pt to radiology for chest xray today to see if we can clear his sternal precautions.   Exit Site Care: Pt's daughter has been changing dressing twice a week. VAD dressing and anchor removed and site care performed using sterile technique. Large amount of adhesive from previous dressings on abdomen- removed with several adhesive remover wipes. Drive line exit site cleaned with Chlora prep applicators x 2, allowed to dry, and gauze dressing and Silverlon patch re-applied. Exit site partially incorporated.The velour is fully implanted at exit site. No redness, tenderness, or foul odor noted. Small amount of serosanguineous drainage on previous dressing resembles tissue fat necrosis. Drive line anchor re-applied. Pt denies fever or chills. To continue twice weekly dressing changes. Provided with 7 daily kits, 7 cath grip anchors, and adhesive remover wipes for home use.     Plan: Continue twice weekly dressing changes Coumadin  dosing per Tinnie PharmD We will call you with your chest xray results Return to clinic for previously scheduled follow up appt  Isaiah Knoll RN VAD Coordinator  Office: [phone]  24/7 Pager: [phone]

## 2024-04-11 NOTE — Telephone Encounter (Signed)
 Resent documentation as requested by plan

## 2024-04-15 ENCOUNTER — Encounter

## 2024-04-17 ENCOUNTER — Encounter

## 2024-04-17 DIAGNOSIS — Z95811 Presence of heart assist device: Secondary | ICD-10-CM

## 2024-04-17 NOTE — Progress Notes (Signed)
 Early Discharge Summary  Philip Monroe [birth year]  Elson is discharging early due to transportation issues preventing attendance . He completed 1 of 36 sessions.    6 Minute Walk     Row Name 02/14/24 1304         6 Minute Walk   Phase Initial     Distance 950 feet     Walk Time 6 minutes     # of Rest Breaks 0     MPH 1.8     METS 2.42     RPE 13     Perceived Dyspnea  0     VO2 Peak 8.49     Symptoms No     Resting HR 97 bpm     Resting BP --  Doppler 82     Resting Oxygen Saturation  95 %     Exercise Oxygen Saturation  during 6 min walk 96 %     Max Ex. HR 117 bpm     Max Ex. BP --  Doppler 92     2 Minute Post BP --  Doppler 88

## 2024-04-17 NOTE — Progress Notes (Signed)
 Cardiac Individual Treatment Plan  Patient Details  Name: Philip Monroe MRN: [id number] Date of Birth: [birth year] Referring Provider:   Flowsheet Row Cardiac Rehab from 02/14/2024 in [facility] Cardiac and Pulmonary Rehab  Referring Provider Dr. Cherrie    Initial Encounter Date:  Flowsheet Row Cardiac Rehab from 02/14/2024 in [facility] Cardiac and Pulmonary Rehab  Date 02/14/24    Visit Diagnosis: LVAD (left ventricular assist device) present [facility])  Patient's Home Medications on Admission: Current Medications[1]  Past Medical History: Past Medical History:  Diagnosis Date   Aortic root dilatation    a. 07/2022 Echo: Ao root 4.4cm, Asc Ao 4.0cm.   Asthma    as a child, no problems as an adult, rarely uses inhaler   CAD (coronary artery disease)    a. 08/2017 Cath: LM nl, LAD 71m/d, D1 70ost, LCX 143m, OM1 90 inf branch, 60 sup branch, OM2 small, RCA 30 diffuse, RPDA 100 w/ L->R collats; b. 07/2022 Echo: LM nl, LAD 63m, D1 80, LCX 155m, OM1 80, RCA 56m, RPDA 100-->No targets for PCI/CABG-->Med rx.   CHF (congestive heart failure) (HCC)    takes lasix    Chronic HFrEF (heart failure with reduced ejection fraction) (HCC)    a. 03/2015 Echo: EF 35-40%; b. 08/2017 Echo: EF 15%; c. 08/2017 cMRI: EF 35%; d. 06/2018 Echo: EF 10-15%; e. 07/2022 Echo: EF 15-20%, grIII DD, mild MR, sev dil LA, mod-sev PH, Ao root 4.4cm, Asc Ao 4.0cm; f. 07/2022 RHC: RA 4, RV 38/3, PA 43/18 (26), PCWP 11. CO/CI 5.3/1.8.   CKD (chronic kidney disease), stage II    Diabetes mellitus without complication (HCC)    type 2   Dyspnea    with exertion   Gout    Hyperlipidemia LDL goal <70    Hypertension    Ischemic cardiomyopathy    a. 03/2015 Echo: EF 35-40%; b. 08/2017 Echo: EF 15%; c. 08/2017 cMRI: EF 35%; d. 06/2018 Echo: EF 10-15%; e. 07/2022 Echo: EF 15-20%, grIII DD.   IVCD (intraventricular conduction defect)    Left ventricular noncompaction [facility])    Morbid obesity (HCC)    NSVT (nonsustained ventricular tachycardia) (HCC)     a. 09/2022 Event monitor: predominantly sinus rhythm, avg HR 81 (46-167). 17 runs NSVT (fastest/longest 13 beats x 167). Rare PACs, 17.5% PVC burden.   PVC's (premature ventricular contractions)    a. 09/2022 Event Monitor: 17.5% PVC burden.   Statin intolerance    Tobacco abuse     Tobacco Use: Tobacco Use History[2]  Labs: Review Flowsheet  More data exists      Latest Ref Rng & Units 01/20/2024 01/21/2024 01/22/2024 01/23/2024 01/24/2024  Labs for ITP Cardiac and Pulmonary Rehab  O2 Saturation % 62.9  63.3  67.4  58.7  67.2      Exercise Target Goals: Exercise Program Goal: Individual exercise prescription set using results from initial 6 min walk test and THRR while considering  patients activity barriers and safety.   Exercise Prescription Goal: Initial exercise prescription builds to 30-45 minutes a day of aerobic activity, 2-3 days per week.  Home exercise guidelines will be given to patient during program as part of exercise prescription that the participant will acknowledge.   Education: Aerobic Exercise: - Group verbal and visual presentation on the components of exercise prescription. Introduces F.I.T.T principle from ACSM for exercise prescriptions.  Reviews F.I.T.T. principles of aerobic exercise including progression. Written material provided at class time. Flowsheet Row Cardiac Rehab from 02/14/2024 in [facility] Cardiac and Pulmonary  Rehab  Education need identified 02/14/24    Education: Resistance Exercise: - Group verbal and visual presentation on the components of exercise prescription. Introduces F.I.T.T principle from ACSM for exercise prescriptions  Reviews F.I.T.T. principles of resistance exercise including progression. Written material provided at class time.    Education: Exercise & Equipment Safety: - Individual verbal instruction and demonstration of equipment use and safety with use of the equipment. Flowsheet Row Cardiac Rehab from 02/14/2024 in [facility]  Cardiac and Pulmonary Rehab  Date 02/14/24  Educator [facility]  Instruction Review Code 1- Verbalizes Understanding    Education: Exercise Physiology & General Exercise Guidelines: - Group verbal and written instruction with models to review the exercise physiology of the cardiovascular system and associated critical values. Provides general exercise guidelines with specific guidelines to those with heart or lung disease. Written material provided at class time. Flowsheet Row Cardiac Rehab from 02/14/2024 in [facility] Cardiac and Pulmonary Rehab  Education need identified 02/14/24    Education: Flexibility, Balance, Mind/Body Relaxation: - Group verbal and visual presentation with interactive activity on the components of exercise prescription. Introduces F.I.T.T principle from ACSM for exercise prescriptions. Reviews F.I.T.T. principles of flexibility and balance exercise training including progression. Also discusses the mind body connection.  Reviews various relaxation techniques to help reduce and manage stress (i.e. Deep breathing, progressive muscle relaxation, and visualization). Balance handout provided to take home. Written material provided at class time.   Activity Barriers & Risk Stratification:  Activity Barriers & Cardiac Risk Stratification - 02/14/24 1305       Activity Barriers & Cardiac Risk Stratification   Activity Barriers Decreased Ventricular Function;Muscular Weakness    Cardiac Risk Stratification High          6 Minute Walk:  6 Minute Walk     Row Name 02/14/24 1304         6 Minute Walk   Phase Initial     Distance 950 feet     Walk Time 6 minutes     # of Rest Breaks 0     MPH 1.8     METS 2.42     RPE 13     Perceived Dyspnea  0     VO2 Peak 8.49     Symptoms No     Resting HR 97 bpm     Resting BP --  Doppler 82     Resting Oxygen Saturation  95 %     Exercise Oxygen Saturation  during 6 min walk 96 %     Max Ex. HR 117 bpm     Max Ex. BP --   Doppler 92     2 Minute Post BP --  Doppler 88        Oxygen Initial Assessment:   Oxygen Re-Evaluation:   Oxygen Discharge (Final Oxygen Re-Evaluation):   Initial Exercise Prescription:  Initial Exercise Prescription - 02/14/24 1300       Date of Initial Exercise RX and Referring Provider   Date 02/14/24    Referring Provider Dr. Bensimhon      Oxygen   Maintain Oxygen Saturation 88% or higher      NuStep   Level 2    SPM 80    Minutes 15    METs 2.42      REL-XR   Level 1    Speed 50    Minutes 15    METs 2.42      Track   Laps 20  Minutes 15    METs 2.09      Prescription Details   Frequency (times per week) 2    Duration Progress to 30 minutes of continuous aerobic without signs/symptoms of physical distress      Intensity   THRR 40-80% of Max Heartrate 127-157    Ratings of Perceived Exertion 11-13    Perceived Dyspnea 0-4      Progression   Progression Continue to progress workloads to maintain intensity without signs/symptoms of physical distress.      Resistance Training   Training Prescription Yes    Weight 2    Reps 10-15          Perform Capillary Blood Glucose checks as needed.  Exercise Prescription Changes:   Exercise Prescription Changes     Row Name 02/14/24 1300             Response to Exercise   Blood Pressure (Admit) --  Doppler 82       Blood Pressure (Exercise) --  Doppler 92       Blood Pressure (Exit) --  Doppler 88       Heart Rate (Admit) 97 bpm       Heart Rate (Exercise) 117 bpm       Heart Rate (Exit) 96 bpm       Oxygen Saturation (Admit) 95 %       Oxygen Saturation (Exercise) 96 %       Oxygen Saturation (Exit) 96 %       Rating of Perceived Exertion (Exercise) 13       Perceived Dyspnea (Exercise) 0       Symptoms none       Comments 6 MWT results          Exercise Comments:   Exercise Goals and Review:   Exercise Goals     Row Name 02/14/24 1312             Exercise Goals    Increase Physical Activity Yes       Intervention Provide advice, education, support and counseling about physical activity/exercise needs.;Develop an individualized exercise prescription for aerobic and resistive training based on initial evaluation findings, risk stratification, comorbidities and participant's personal goals.       Expected Outcomes Short Term: Attend rehab on a regular basis to increase amount of physical activity.;Long Term: Add in home exercise to make exercise part of routine and to increase amount of physical activity.;Long Term: Exercising regularly at least 3-5 days a week.       Increase Strength and Stamina Yes       Intervention Provide advice, education, support and counseling about physical activity/exercise needs.;Develop an individualized exercise prescription for aerobic and resistive training based on initial evaluation findings, risk stratification, comorbidities and participant's personal goals.       Expected Outcomes Short Term: Increase workloads from initial exercise prescription for resistance, speed, and METs.;Short Term: Perform resistance training exercises routinely during rehab and add in resistance training at home;Long Term: Improve cardiorespiratory fitness, muscular endurance and strength as measured by increased METs and functional capacity ( )       Able to understand and use rate of perceived exertion (RPE) scale Yes       Intervention Provide education and explanation on how to use RPE scale       Expected Outcomes Short Term: Able to use RPE daily in rehab to express subjective intensity level;Long Term:  Able to use RPE to  guide intensity level when exercising independently       Able to understand and use Dyspnea scale Yes       Intervention Provide education and explanation on how to use Dyspnea scale       Expected Outcomes Short Term: Able to use Dyspnea scale daily in rehab to express subjective sense of shortness of breath during  exertion;Long Term: Able to use Dyspnea scale to guide intensity level when exercising independently       Knowledge and understanding of Target Heart Rate Range (THRR) Yes       Intervention Provide education and explanation of THRR including how the numbers were predicted and where they are located for reference       Expected Outcomes Short Term: Able to state/look up THRR;Long Term: Able to use THRR to govern intensity when exercising independently;Short Term: Able to use daily as guideline for intensity in rehab       Able to check pulse independently Yes       Intervention Provide education and demonstration on how to check pulse in carotid and radial arteries.;Review the importance of being able to check your own pulse for safety during independent exercise       Expected Outcomes Short Term: Able to explain why pulse checking is important during independent exercise;Long Term: Able to check pulse independently and accurately       Understanding of Exercise Prescription Yes       Intervention Provide education, explanation, and written materials on patient's individual exercise prescription       Expected Outcomes Short Term: Able to explain program exercise prescription;Long Term: Able to explain home exercise prescription to exercise independently          Exercise Goals Re-Evaluation :   Discharge Exercise Prescription (Final Exercise Prescription Changes):  Exercise Prescription Changes - 02/14/24 1300       Response to Exercise   Blood Pressure (Admit) --   Doppler 82   Blood Pressure (Exercise) --   Doppler 92   Blood Pressure (Exit) --   Doppler 88   Heart Rate (Admit) 97 bpm    Heart Rate (Exercise) 117 bpm    Heart Rate (Exit) 96 bpm    Oxygen Saturation (Admit) 95 %    Oxygen Saturation (Exercise) 96 %    Oxygen Saturation (Exit) 96 %    Rating of Perceived Exertion (Exercise) 13    Perceived Dyspnea (Exercise) 0    Symptoms none    Comments 6 MWT results           Nutrition:  Target Goals: Understanding of nutrition guidelines, daily intake of sodium 1500mg , cholesterol 200mg , calories 30% from fat and 7% or less from saturated fats, daily to have 5 or more servings of fruits and vegetables.  Education: Nutrition 1 -Group instruction provided by verbal, written material, interactive activities, discussions, models, and posters to present general guidelines for heart healthy nutrition including macronutrients, label reading, and promoting whole foods over processed counterparts. Education serves as pensions consultant of discussion of heart healthy eating for all. Written material provided at class time.    Education: Nutrition 2 -Group instruction provided by verbal, written material, interactive activities, discussions, models, and posters to present general guidelines for heart healthy nutrition including sodium, cholesterol, and saturated fat. Providing guidance of habit forming to improve blood pressure, cholesterol, and body weight. Written material provided at class time.     Biometrics:  Pre Biometrics - 02/14/24 1313  Pre Biometrics   Height 6' 2 (1.88 m)    Weight 329 lb 12.8 oz (149.6 kg)    Waist Circumference 52 inches    Hip Circumference 53 inches    Waist to Hip Ratio 0.98 %    BMI (Calculated) 42.33    Single Leg Stand 4.57 seconds           Nutrition Therapy Plan and Nutrition Goals:  Nutrition Therapy & Goals - 02/14/24 1318       Intervention Plan   Intervention Prescribe, educate and counsel regarding individualized specific dietary modifications aiming towards targeted core components such as weight, hypertension, lipid management, diabetes, heart failure and other comorbidities.    Expected Outcomes Short Term Goal: Understand basic principles of dietary content, such as calories, fat, sodium, cholesterol and nutrients.;Short Term Goal: A plan has been developed with personal nutrition goals set during dietitian  appointment.;Long Term Goal: Adherence to prescribed nutrition plan.          Nutrition Assessments:  MEDIFICTS Score Key: >=70 Need to make dietary changes  40-70 Heart Healthy Diet <= 40 Therapeutic Level Cholesterol Diet  Flowsheet Row Cardiac Rehab from 02/14/2024 in [facility]  Cardiac and Pulmonary Rehab  Picture Your Plate Total Score on Admission 63   Picture Your Plate Scores: <59 Unhealthy dietary pattern with much room for improvement. 41-50 Dietary pattern unlikely to meet recommendations for good health and room for improvement. 51-60 More healthful dietary pattern, with some room for improvement.  >60 Healthy dietary pattern, although there may be some specific behaviors that could be improved.    Nutrition Goals Re-Evaluation:   Nutrition Goals Discharge (Final Nutrition Goals Re-Evaluation):   Psychosocial: Target Goals: Acknowledge presence or absence of significant depression and/or stress, maximize coping skills, provide positive support system. Participant is able to verbalize types and ability to use techniques and skills needed for reducing stress and depression.   Education: Stress, Anxiety, and Depression - Group verbal and visual presentation to define topics covered.  Reviews how body is impacted by stress, anxiety, and depression.  Also discusses healthy ways to reduce stress and to treat/manage anxiety and depression. Written material provided at class time.   Education: Sleep Hygiene -Provides group verbal and written instruction about how sleep can affect your health.  Define sleep hygiene, discuss sleep cycles and impact of sleep habits. Review good sleep hygiene tips.   Initial Review & Psychosocial Screening:  Initial Psych Review & Screening - 02/07/24 1013       Initial Review   Current issues with Current Stress Concerns;Current Sleep Concerns    Source of Stress Concerns Chronic Illness;Unable to perform yard/household activities;Unable to  participate in former interests or hobbies      Family Dynamics   Good Support System? Yes   family     Barriers   Psychosocial barriers to participate in program There are no identifiable barriers or psychosocial needs.;The patient should benefit from training in stress management and relaxation.      Screening Interventions   Interventions Encouraged to exercise;Provide feedback about the scores to participant;To provide support and resources with identified psychosocial needs    Expected Outcomes Long Term Goal: Stressors or current issues are controlled or eliminated.;Short Term goal: Utilizing psychosocial counselor, staff and physician to assist with identification of specific Stressors or current issues interfering with healing process. Setting desired goal for each stressor or current issue identified.;Short Term goal: Identification and review with participant of any Quality of Life or Depression  concerns found by scoring the questionnaire.;Long Term goal: The participant improves quality of Life and PHQ9 Scores as seen by post scores and/or verbalization of changes          Quality of Life Scores:   Quality of Life - 02/14/24 1318       Quality of Life   Select Quality of Life      Quality of Life Scores   Health/Function Pre 17.68 %    Socioeconomic Pre 16.93 %    Psych/Spiritual Pre 21.43 %    Family Pre 23 %    GLOBAL Pre 19 %         Scores of 19 and below usually indicate a poorer quality of life in these areas.  A difference of  2-3 points is a clinically meaningful difference.  A difference of 2-3 points in the total score of the Quality of Life Index has been associated with significant improvement in overall quality of life, self-image, physical symptoms, and general health in studies assessing change in quality of life.  PHQ-9: Review Flowsheet       02/14/2024 06/11/2015 05/18/2015 04/20/2015  Depression screen PHQ 2/9  Decreased Interest 0 0 0 0  Down,  Depressed, Hopeless 0 0 0 0  PHQ - 2 Score 0 0 0 0  Altered sleeping 1 - - -  Tired, decreased energy 0 - - -  Change in appetite 1 - - -  Feeling bad or failure about yourself  0 - - -  Trouble concentrating 0 - - -  Moving slowly or fidgety/restless 0 - - -  Suicidal thoughts 0 - - -  PHQ-9 Score 2 - - -  Difficult doing work/chores Not difficult at all - - -   Interpretation of Total Score  Total Score Depression Severity:  1-4 = Minimal depression, 5-9 = Mild depression, 10-14 = Moderate depression, 15-19 = Moderately severe depression, 20-27 = Severe depression   Psychosocial Evaluation and Intervention:  Psychosocial Evaluation - 02/07/24 1020       Psychosocial Evaluation & Interventions   Comments Mr. Mabus is coming to cardiac rehab post LVAD. He states there has been some stress revolving around getting used to his LVAD and figuring out what his new normal is. His sleep has also been altered because he has a hard time getting comfortable, wakes up sweaty and in pain sometimes. He mentions he has a good support system. He notes he is aware this will all take time to get used to and he is ready to get involved in the program to learn more about heart healthy living.    Expected Outcomes Short: attend cardiac rehab for education and exercise Long: develop and maintain positive self care habits    Continue Psychosocial Services  Follow up required by staff          Psychosocial Re-Evaluation:   Psychosocial Discharge (Final Psychosocial Re-Evaluation):   Vocational Rehabilitation: Provide vocational rehab assistance to qualifying candidates.   Vocational Rehab Evaluation & Intervention:  Vocational Rehab - 02/07/24 1013       Initial Vocational Rehab Evaluation & Intervention   Assessment shows need for Vocational Rehabilitation No          Education: Education Goals: Education classes will be provided on a variety of topics geared toward better understanding  of heart health and risk factor modification. Participant will state understanding/return demonstration of topics presented as noted by education test scores.  Learning Barriers/Preferences:  Learning Barriers/Preferences -  02/07/24 1013       Learning Barriers/Preferences   Learning Barriers None    Learning Preferences None          General Cardiac Education Topics:  AED/CPR: - Group verbal and written instruction with the use of models to demonstrate the basic use of the AED with the basic ABC's of resuscitation.   Test and Procedures: - Group verbal and visual presentation and models provide information about basic cardiac anatomy and function. Reviews the testing methods done to diagnose heart disease and the outcomes of the test results. Describes the treatment choices: Medical Management, Angioplasty, or Coronary Bypass Surgery for treating various heart conditions including Myocardial Infarction, Angina, Valve Disease, and Cardiac Arrhythmias. Written material provided at class time. Flowsheet Row Cardiac Rehab from 02/14/2024 in [facility] Cardiac and Pulmonary Rehab  Education need identified 02/14/24    Medication Safety: - Group verbal and visual instruction to review commonly prescribed medications for heart and lung disease. Reviews the medication, class of the drug, and side effects. Includes the steps to properly store meds and maintain the prescription regimen. Written material provided at class time.   Intimacy: - Group verbal instruction through game format to discuss how heart and lung disease can affect sexual intimacy. Written material provided at class time.   Know Your Numbers and Heart Failure: - Group verbal and visual instruction to discuss disease risk factors for cardiac and pulmonary disease and treatment options.  Reviews associated critical values for Overweight/Obesity, Hypertension, Cholesterol, and Diabetes.  Discusses basics of heart failure:  signs/symptoms and treatments.  Introduces Heart Failure Zone chart for action plan for heart failure. Written material provided at class time.   Infection Prevention: - Provides verbal and written material to individual with discussion of infection control including proper hand washing and proper equipment cleaning during exercise session. Flowsheet Row Cardiac Rehab from 02/14/2024 in [facility] Cardiac and Pulmonary Rehab  Date 02/14/24  Educator [facility]  Instruction Review Code 1- Verbalizes Understanding    Falls Prevention: - Provides verbal and written material to individual with discussion of falls prevention and safety. Flowsheet Row Cardiac Rehab from 02/14/2024 in [facility] Cardiac and Pulmonary Rehab  Date 02/14/24  Educator [facility]  Instruction Review Code 1- Verbalizes Understanding    Other: -Provides group and verbal instruction on various topics (see comments)   Knowledge Questionnaire Score:  Knowledge Questionnaire Score - 02/14/24 1315       Knowledge Questionnaire Score   Pre Score 23/26          Core Components/Risk Factors/Patient Goals at Admission:  Personal Goals and Risk Factors at Admission - 02/14/24 1316       Core Components/Risk Factors/Patient Goals on Admission    Weight Management Yes;Weight Loss    Intervention Weight Management: Develop a combined nutrition and exercise program designed to reach desired caloric intake, while maintaining appropriate intake of nutrient and fiber, sodium and fats, and appropriate energy expenditure required for the weight goal.;Weight Management: Provide education and appropriate resources to help participant work on and attain dietary goals.;Weight Management/Obesity: Establish reasonable short term and long term weight goals.;Obesity: Provide education and appropriate resources to help participant work on and attain dietary goals.    Admit Weight 329 lb 12.8 oz (149.6 kg)    Goal Weight: Short Term 320 lb (145.2 kg)    Goal  Weight: Long Term 250 lb (113.4 kg)    Expected Outcomes Short Term: Continue to assess and modify interventions until short term weight  is achieved;Long Term: Adherence to nutrition and physical activity/exercise program aimed toward attainment of established weight goal;Weight Loss: Understanding of general recommendations for a balanced deficit meal plan, which promotes 1-2 lb weight loss per week and includes a negative energy balance of [phone] kcal/d;Understanding recommendations for meals to include 15-35% energy as protein, 25-35% energy from fat, 35-60% energy from carbohydrates, less than 200mg  of dietary cholesterol, 20-35 gm of total fiber daily;Understanding of distribution of calorie intake throughout the day with the consumption of 4-5 meals/snacks    Tobacco Cessation Yes    Number of packs per day 0   recent quit   Intervention Assist the participant in steps to quit. Provide individualized education and counseling about committing to Tobacco Cessation, relapse prevention, and pharmacological support that can be provided by physician.;Education officer, environmental, assist with locating and accessing local/national Quit Smoking programs, and support quit date choice.    Expected Outcomes Short Term: Will demonstrate readiness to quit, by selecting a quit date.;Short Term: Will quit all tobacco product use, adhering to prevention of relapse plan.;Long Term: Complete abstinence from all tobacco products for at least 12 months from quit date.    Diabetes Yes    Intervention Provide education about signs/symptoms and action to take for hypo/hyperglycemia.;Provide education about proper nutrition, including hydration, and aerobic/resistive exercise prescription along with prescribed medications to achieve blood glucose in normal ranges: Fasting glucose 65-99 mg/dL    Expected Outcomes Short Term: Participant verbalizes understanding of the signs/symptoms and immediate care of hyper/hypoglycemia,  proper foot care and importance of medication, aerobic/resistive exercise and nutrition plan for blood glucose control.;Long Term: Attainment of HbA1C < 7%.    Heart Failure Yes    Intervention Provide a combined exercise and nutrition program that is supplemented with education, support and counseling about heart failure. Directed toward relieving symptoms such as shortness of breath, decreased exercise tolerance, and extremity edema.    Expected Outcomes Improve functional capacity of life;Short term: Attendance in program 2-3 days a week with increased exercise capacity. Reported lower sodium intake. Reported increased fruit and vegetable intake. Reports medication compliance.;Short term: Daily weights obtained and reported for increase. Utilizing diuretic protocols set by physician.;Long term: Adoption of self-care skills and reduction of barriers for early signs and symptoms recognition and intervention leading to self-care maintenance.    Hypertension Yes    Intervention Provide education on lifestyle modifcations including regular physical activity/exercise, weight management, moderate sodium restriction and increased consumption of fresh fruit, vegetables, and low fat dairy, alcohol moderation, and smoking cessation.;Monitor prescription use compliance.    Expected Outcomes Short Term: Continued assessment and intervention until BP is < 140/46mm HG in hypertensive participants. < 130/64mm HG in hypertensive participants with diabetes, heart failure or chronic kidney disease.;Long Term: Maintenance of blood pressure at goal levels.    Lipids Yes    Intervention Provide education and support for participant on nutrition & aerobic/resistive exercise along with prescribed medications to achieve LDL 70mg , HDL >40mg .    Expected Outcomes Short Term: Participant states understanding of desired cholesterol values and is compliant with medications prescribed. Participant is following exercise prescription  and nutrition guidelines.;Long Term: Cholesterol controlled with medications as prescribed, with individualized exercise RX and with personalized nutrition plan. Value goals: LDL < 70mg , HDL > 40 mg.          Education:Diabetes - Individual verbal and written instruction to review signs/symptoms of diabetes, desired ranges of glucose level fasting, after meals and with exercise. Acknowledge that pre  and post exercise glucose checks will be done for 3 sessions at entry of program. Flowsheet Row Cardiac Rehab from 02/14/2024 in [facility]  Date 02/14/24  Educator [facility]  Instruction Review Code 1- Verbalizes Understanding    Core Components/Risk Factors/Patient Goals Review:   Goals and Risk Factor Review     Row Name 02/14/24 1303             Core Components/Risk Factors/Patient Goals Review   Personal Goals Review Tobacco Cessation       Review Tron has recently quit tobacco use within the last 6 months. Intervention for relapse prevention was provided at the initial medical review. He was encouraged to continue to with tobacco cessation and was provided information on relapse prevention. Patient received information about combination therapy, tobacco cessation classes, quit line, and quit smoking apps in case of a relapse. Patient demonstrated understanding of this material.Staff will continue to provide encouragement and follow up with the patient throughout the program.          Core Components/Risk Factors/Patient Goals at Discharge (Final Review):   Goals and Risk Factor Review - 02/14/24 1303       Core Components/Risk Factors/Patient Goals Review   Personal Goals Review Tobacco Cessation    Review Ramin has recently quit tobacco use within the last 6 months. Intervention for relapse prevention was provided at the initial medical review. He was encouraged to continue to with tobacco cessation and was provided information on relapse prevention. Patient  received information about combination therapy, tobacco cessation classes, quit line, and quit smoking apps in case of a relapse. Patient demonstrated understanding of this material.Staff will continue to provide encouragement and follow up with the patient throughout the program.          ITP Comments:  ITP Comments     Row Name 02/07/24 1024 02/14/24 1302 02/28/24 1306 03/27/24 0813 04/17/24 1014   ITP Comments Initial phone call completed. Diagnosis can be [facility] in [facility] 11/19. EP Orientation scheduled for Wednesday 12/3 at 10:30am. Completed and gym orientation for cardiac rehab. Initial ITP created and sent for review to Dr. Oneil Pinal, Medical Director. Djuan has recently quit tobacco use within the last 6 months. Intervention for relapse prevention was provided at the initial medical review. He was encouraged to continue to with tobacco cessation and was provided information on relapse prevention. Patient received information about combination therapy, tobacco cessation classes, quit line, and quit smoking apps in case of a relapse. Patient demonstrated understanding of this material.Staff will continue to provide encouragement and follow up with the patient throughout the program. 30 Day review completed. Medical Director ITP review done, changes made as directed, and signed approval by Medical Director. New to program 30 Day review completed. Medical Director ITP review done, changes made as directed, and signed approval by Medical Director. Patient has been out since last review. Early discharge due transportation issues preventing attendance      Comments: early discharge    [1]  Current Outpatient Medications:    Accu-Chek Softclix Lancets lancets, Use up to four times daily as directed. 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 (Patient not taking: Reported on 02/29/2024), Disp: 100 each, Rfl: 0   albuterol  (VENTOLIN  HFA) 108 (90 Base) MCG/ACT inhaler, Inhale 1-2 puffs into the lungs every 6  (six) hours as needed for wheezing or shortness of breath., Disp: , Rfl:    allopurinol  (ZYLOPRIM ) 100 MG tablet, Take 1 tablet (100 mg total) by  mouth daily., Disp: 90 tablet, Rfl: 3   Blood Glucose Monitoring Suppl (BLOOD GLUCOSE MONITOR SYSTEM) w/Device KIT, Use up to four times daily as directed. 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 (Patient not taking: Reported on 02/29/2024), Disp: 1 kit, Rfl: 0   colchicine  0.6 MG tablet, Take 1 tablet (0.6 mg total) by mouth daily., Disp: 30 tablet, Rfl: 5   digoxin  (LANOXIN ) 0.125 MG tablet, Take 1 tablet (0.125 mg total) by mouth daily., Disp: 90 tablet, Rfl: 3   empagliflozin  (JARDIANCE ) 10 MG TABS tablet, Take 1 tablet (10 mg total) by mouth daily., Disp: 90 tablet, Rfl: 3   ezetimibe  (ZETIA ) 10 MG tablet, Take 1 tablet (10 mg total) by mouth at bedtime., Disp: 90 tablet, Rfl: 3   gabapentin  (NEURONTIN ) 100 MG capsule, Take 4 capsules (400 mg total) by mouth daily before breakfast., Disp: 60 capsule, Rfl: 5   gabapentin  (NEURONTIN ) 300 MG capsule, Take 2 capsules (600 mg total) by mouth daily at 8 pm., Disp: 60 capsule, Rfl: 5   glucose blood (ACCU-CHEK GUIDE TEST) test strip, Use up to four times daily as directed. 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 (Patient not taking: Reported on 02/29/2024), Disp: 100 each, Rfl: 0   Lancet Device MISC, 1 each by Does not apply route as directed. Dispense based on patient and insurance preference. Use up to four times daily as directed. 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 (Patient not taking: Reported on 02/29/2024), Disp: 1 each, Rfl: 0   metFORMIN  (GLUCOPHAGE ) 500 MG tablet, Take 1 tablet (500 mg total) by mouth daily with breakfast., Disp: 90 tablet, Rfl: 3   mexiletine (MEXITIL ) 150 MG capsule, Take 2 capsules (300 mg total) by mouth 2 (two) times daily., Disp: 360 capsule, Rfl: 3   pantoprazole  (PROTONIX ) 40 MG tablet, Take 1 tablet (40 mg total) by mouth daily., Disp: 90 tablet, Rfl: 3   sacubitril -valsartan  (ENTRESTO ) 24-26 MG, Take 1  tablet by mouth 2 (two) times daily., Disp: 60 tablet, Rfl: 3   spironolactone  (ALDACTONE ) 25 MG tablet, Take 1 tablet (25 mg total) by mouth daily., Disp: 30 tablet, Rfl: 1   torsemide  (DEMADEX ) 20 MG tablet, Take 1 tablet (20 mg total) by mouth every other day for 30 doses., Disp: 45 tablet, Rfl: 3   traMADol  (ULTRAM ) 50 MG tablet, Take 1-2 tablets (50-100 mg total) by mouth every 8 (eight) hours as needed., Disp: 30 tablet, Rfl: 1   traMADol  (ULTRAM ) 50 MG tablet, Take 1-2 tablets (50-100 mg total) by mouth every 8 (eight) hours as needed for post op pain., Disp: 30 tablet, Rfl: 1   warfarin (COUMADIN ) 5 MG tablet, Take 5 mg (1 tablet) daily except take 7.5 mg (1.5 tablets) on Monday, Wednesday and Friday, or as directed by the heart failure clinic, Disp: 108 tablet, Rfl: 3 [2]  Social History Tobacco Use  Smoking Status Former   Current packs/day: 0.00   Average packs/day: 1.0 packs/day   Types: Cigarettes   Quit date: 03/20/2015   Years since quitting: 9.0  Smokeless Tobacco Never  Tobacco Comments   Stopped smoking 11/24/2023

## 2024-04-17 NOTE — Telephone Encounter (Signed)
 Spoke with Philip Monroe, he reports he is still having transportation issues. Informed him that he will be discharged today.

## 2024-04-18 ENCOUNTER — Other Ambulatory Visit (HOSPITAL_COMMUNITY): Payer: Self-pay

## 2024-04-22 ENCOUNTER — Encounter

## 2024-04-24 ENCOUNTER — Encounter

## 2024-04-29 ENCOUNTER — Ambulatory Visit (HOSPITAL_COMMUNITY): Admitting: Internal Medicine

## 2024-04-29 ENCOUNTER — Encounter

## 2024-05-01 ENCOUNTER — Ambulatory Visit (HOSPITAL_COMMUNITY): Admitting: Cardiology

## 2024-05-01 ENCOUNTER — Encounter

## 2024-05-06 ENCOUNTER — Encounter

## 2024-05-08 ENCOUNTER — Encounter

## 2024-05-13 ENCOUNTER — Encounter

## 2024-05-15 ENCOUNTER — Encounter

## 2024-05-20 ENCOUNTER — Encounter

## 2024-05-22 ENCOUNTER — Encounter

## 2024-05-27 ENCOUNTER — Encounter

## 2024-05-29 ENCOUNTER — Encounter

## 2024-06-03 ENCOUNTER — Encounter

## 2024-06-05 ENCOUNTER — Encounter

## 2024-06-10 ENCOUNTER — Encounter

## 2024-06-12 ENCOUNTER — Encounter
